# Patient Record
Sex: Female | Born: 1945 | Race: White | Hispanic: No | Smoking: Light tobacco smoker
Health system: Southern US, Community
[De-identification: ages and names within clinical notes are randomized; demographics above are authoritative.]

## PROBLEM LIST (undated history)

## (undated) DIAGNOSIS — G4730 Sleep apnea, unspecified: Secondary | ICD-10-CM

## (undated) DIAGNOSIS — E7800 Pure hypercholesterolemia, unspecified: Secondary | ICD-10-CM

## (undated) DIAGNOSIS — F32A Depression, unspecified: Secondary | ICD-10-CM

## (undated) DIAGNOSIS — G4700 Insomnia, unspecified: Secondary | ICD-10-CM

## (undated) DIAGNOSIS — IMO0001 Reserved for inherently not codable concepts without codable children: Secondary | ICD-10-CM

## (undated) DIAGNOSIS — I10 Essential (primary) hypertension: Secondary | ICD-10-CM

## (undated) DIAGNOSIS — H269 Unspecified cataract: Secondary | ICD-10-CM

## (undated) DIAGNOSIS — M1990 Unspecified osteoarthritis, unspecified site: Secondary | ICD-10-CM

## (undated) DIAGNOSIS — F419 Anxiety disorder, unspecified: Secondary | ICD-10-CM

## (undated) DIAGNOSIS — I639 Cerebral infarction, unspecified: Secondary | ICD-10-CM

## (undated) DIAGNOSIS — J449 Chronic obstructive pulmonary disease, unspecified: Secondary | ICD-10-CM

## (undated) DIAGNOSIS — J45909 Unspecified asthma, uncomplicated: Secondary | ICD-10-CM

## (undated) DIAGNOSIS — C801 Malignant (primary) neoplasm, unspecified: Secondary | ICD-10-CM

## (undated) DIAGNOSIS — K219 Gastro-esophageal reflux disease without esophagitis: Secondary | ICD-10-CM

## (undated) DIAGNOSIS — G43909 Migraine, unspecified, not intractable, without status migrainosus: Secondary | ICD-10-CM

## (undated) DIAGNOSIS — F329 Major depressive disorder, single episode, unspecified: Secondary | ICD-10-CM

## (undated) HISTORY — DX: Insomnia, unspecified: G47.00

## (undated) HISTORY — PX: ABDOMINAL HYSTERECTOMY: SHX81

## (undated) HISTORY — PX: APPENDECTOMY: SHX54

## (undated) HISTORY — DX: Unspecified asthma, uncomplicated: J45.909

## (undated) HISTORY — PX: NECK SURGERY: SHX720

## (undated) HISTORY — PX: EAR EXAMINATION UNDER ANESTHESIA: SHX1482

## (undated) HISTORY — DX: Unspecified cataract: H26.9

## (undated) HISTORY — PX: COLONOSCOPY: SHX174

## (undated) HISTORY — DX: Cerebral infarction, unspecified: I63.9

## (undated) HISTORY — DX: Major depressive disorder, single episode, unspecified: F32.9

## (undated) HISTORY — DX: Migraine, unspecified, not intractable, without status migrainosus: G43.909

## (undated) HISTORY — DX: Depression, unspecified: F32.A

## (undated) HISTORY — PX: CHOLECYSTECTOMY: SHX55

## (undated) HISTORY — PX: BACK SURGERY: SHX140

---

## 2000-05-08 ENCOUNTER — Other Ambulatory Visit: Admission: RE | Admit: 2000-05-08 | Discharge: 2000-05-08 | Payer: Self-pay | Admitting: Family Medicine

## 2000-08-24 ENCOUNTER — Encounter: Payer: Self-pay | Admitting: Emergency Medicine

## 2000-08-24 ENCOUNTER — Inpatient Hospital Stay (HOSPITAL_COMMUNITY): Admission: EM | Admit: 2000-08-24 | Discharge: 2000-08-26 | Payer: Self-pay | Admitting: Emergency Medicine

## 2000-08-26 ENCOUNTER — Encounter: Payer: Self-pay | Admitting: Family Medicine

## 2000-09-02 ENCOUNTER — Encounter: Admission: RE | Admit: 2000-09-02 | Discharge: 2000-09-02 | Payer: Self-pay | Admitting: Family Medicine

## 2002-05-20 ENCOUNTER — Encounter: Admission: RE | Admit: 2002-05-20 | Discharge: 2002-05-20 | Payer: Self-pay

## 2003-01-06 ENCOUNTER — Encounter: Payer: Self-pay | Admitting: Emergency Medicine

## 2003-01-06 ENCOUNTER — Inpatient Hospital Stay (HOSPITAL_COMMUNITY): Admission: EM | Admit: 2003-01-06 | Discharge: 2003-01-07 | Payer: Self-pay | Admitting: Emergency Medicine

## 2003-01-06 ENCOUNTER — Encounter: Payer: Self-pay | Admitting: Cardiology

## 2003-12-03 ENCOUNTER — Inpatient Hospital Stay (HOSPITAL_COMMUNITY): Admission: EM | Admit: 2003-12-03 | Discharge: 2003-12-04 | Payer: Self-pay | Admitting: Emergency Medicine

## 2004-07-30 ENCOUNTER — Observation Stay (HOSPITAL_COMMUNITY): Admission: EM | Admit: 2004-07-30 | Discharge: 2004-07-31 | Payer: Self-pay | Admitting: *Deleted

## 2004-07-30 ENCOUNTER — Ambulatory Visit: Payer: Self-pay | Admitting: Internal Medicine

## 2006-08-26 ENCOUNTER — Inpatient Hospital Stay (HOSPITAL_COMMUNITY): Admission: RE | Admit: 2006-08-26 | Discharge: 2006-08-27 | Payer: Self-pay | Admitting: Neurosurgery

## 2007-04-03 ENCOUNTER — Encounter: Admission: RE | Admit: 2007-04-03 | Discharge: 2007-04-03 | Payer: Self-pay | Admitting: Neurosurgery

## 2007-09-16 ENCOUNTER — Emergency Department (HOSPITAL_COMMUNITY): Admission: EM | Admit: 2007-09-16 | Discharge: 2007-09-16 | Payer: Self-pay | Admitting: Emergency Medicine

## 2008-03-20 ENCOUNTER — Ambulatory Visit: Payer: Self-pay | Admitting: Vascular Surgery

## 2008-06-13 ENCOUNTER — Ambulatory Visit: Payer: Self-pay | Admitting: Vascular Surgery

## 2008-06-19 ENCOUNTER — Ambulatory Visit: Payer: Self-pay | Admitting: Vascular Surgery

## 2008-06-26 ENCOUNTER — Ambulatory Visit: Payer: Self-pay | Admitting: Vascular Surgery

## 2008-07-03 ENCOUNTER — Ambulatory Visit: Payer: Self-pay | Admitting: Vascular Surgery

## 2008-07-11 ENCOUNTER — Ambulatory Visit: Payer: Self-pay | Admitting: Vascular Surgery

## 2010-05-07 ENCOUNTER — Inpatient Hospital Stay (HOSPITAL_COMMUNITY): Admission: EM | Admit: 2010-05-07 | Discharge: 2010-05-08 | Payer: Self-pay | Admitting: Emergency Medicine

## 2010-10-02 LAB — BASIC METABOLIC PANEL
BUN: 17 mg/dL (ref 6–23)
BUN: 17 mg/dL (ref 6–23)
CO2: 26 mEq/L (ref 19–32)
Calcium: 9.4 mg/dL (ref 8.4–10.5)
Creatinine, Ser: 0.8 mg/dL (ref 0.4–1.2)
GFR calc Af Amer: >60 mL/min (ref >60)
GFR calc non Af Amer: >60 mL/min (ref >60)
Glucose, Bld: 155 mg/dL — ABNORMAL HIGH (ref 70–99)
Glucose, Bld: 274 mg/dL — ABNORMAL HIGH (ref 70–99)
Potassium: 3.4 mEq/L — ABNORMAL LOW (ref 3.5–5.1)

## 2010-10-02 LAB — CBC
HCT: 40 % (ref 36.0–46.0)
Hemoglobin: 12.5 g/dL (ref 12.0–15.0)
Hemoglobin: 14.1 g/dL (ref 12.0–15.0)
MCHC: 35.3 g/dL (ref 30.0–36.0)
MCV: 92.5 fL (ref 78.0–100.0)
Platelets: 264 10*3/uL (ref 150–400)
Platelets: 294 10*3/uL (ref 150–400)
RBC: 4.33 MIL/uL (ref 3.87–5.11)
RDW: 12.9 % (ref 11.5–15.5)

## 2010-10-02 LAB — LIPID PANEL
HDL: 29 mg/dL — ABNORMAL LOW (ref >39)
LDL Cholesterol: 77 mg/dL (ref 0–99)

## 2010-10-02 LAB — DIFFERENTIAL
Basophils Absolute: 0 10*3/uL (ref 0.0–0.1)
Basophils Relative: 0 % (ref 0–1)
Eosinophils Absolute: 0 10*3/uL (ref 0.0–0.7)
Eosinophils Relative: 0 % (ref 0–5)
Lymphs Abs: 0.3 10*3/uL — ABNORMAL LOW (ref 0.7–4.0)
Neutro Abs: 10.9 10*3/uL — ABNORMAL HIGH (ref 1.7–7.7)
Neutrophils Relative %: 96 % — ABNORMAL HIGH (ref 43–77)

## 2010-10-02 LAB — GLUCOSE, CAPILLARY

## 2010-12-03 NOTE — Assessment & Plan Note (Signed)
OFFICE VISIT   BETTEJANE, LEAVENS  DOB:  1945-10-22                                       06/13/2008  ZOXWR#:[id number]   The patient returns for her 3 month followup visit for severe venous  insufficiency of both lower extremities.  She has large bulbous  varicosities throughout both legs in the calf, thigh, both anteriorly  and posteriorly, secondary to severe reflux in both great saphenous  veins throughout from the saphenofemoral junction to the knee.  She had  an unaccessible attempt at closure in the past of the saphenous vein but  has continued to have severe symptoms.  Elastic compression stockings  have been worn on a daily basis (long leg 20 mm - 30 mm gradient).  She  has tried ibuprofen as well as elevation of the legs without success and  this is definitely effecting her activities of daily living on a  constant basis.  She does need laser ablation of her left great  saphenous vein with multiple stab phlebectomies to be followed by the  right side and then will probably require an additional session or two  of stab phlebectomies because of the multiple number of these painful  varicosities.  We will proceed in the near future with the left leg.   [name], M.D.  Electronically Signed   JDL/MEDQ  D:  06/13/2008  T:  06/14/2008  Job:  [id number]

## 2010-12-03 NOTE — Assessment & Plan Note (Signed)
OFFICE VISIT   URI, COVEY  DOB:  May 24, 1946                                       06/26/2008  ZOXWR#:[id number]   The patient underwent laser ablation of her left great saphenous vein  with greater than 20 stab phlebectomies 1 week ago for painful  varicosities in the left thigh and ankle and calf area.  She states that  she has had minimal discomfort except for at the ablation site in the  thigh.  She has had no swelling distally in the ankle and foot and has  been wearing her elastic compression stocking and taking ibuprofen as  instructed.  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   On exam, she has no distal edema in the left leg, her stab phlebectomy  wounds are healing well.  She does have a moderate amount of ecchymosis  in the left medial thigh, although this is only minimally tender.  She  is ready to proceed with a similar procedure on the contralateral right  leg, which we will schedule next week, for the painful varicosities in  the right leg.   [name], M.D.  Electronically Signed   JDL/MEDQ  D:  06/26/2008  T:  06/27/2008  Job:  [id number]

## 2010-12-03 NOTE — Procedures (Signed)
DUPLEX DEEP VENOUS EXAM - LOWER EXTREMITY   INDICATION:  Follow up left greater saphenous vein ablation.   HISTORY:  Edema:  Intermittent, improved since procedure.  Trauma/Surgery:  Left greater saphenous vein ablation with stab  phlebectomy on 06/19/2008 by Dr. Hart Rochester.  Pain:  Proximal / mid thigh.  PE:  No.  Previous DVT:  No.  Anticoagulants:  No.  Other:   DUPLEX EXAM:                CFV   SFV   PopV  PTV    GSV                R  L  R  L  R  L  R   L  R  L  Thrombosis    o  o     o     o      o     +  Spontaneous   +  +     +     +      +     0  Phasic        +  +     +     +      +     0  Augmentation  +  +     +     +      +     0  Compressible  +  +     +     +      +     0  Competent     0  0     +     +      +     0   Legend:  + - yes  o - no  p - partial  D - decreased   IMPRESSION:  1. No evidence of deep venous thrombosis in the left lower extremity      or the right common femoral vein.  2. Evidence of ablation without flow in the left greater saphenous      vein from groin to knee (no extension from saphenofemoral      junction).  3. 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  4. Evidence of bilateral common femoral venous insufficiency.  5. Note:  Left greater saphenous vein of caliber of >1.5 cm in areas      of the thigh.   __________________________________________   Quita Skye. Lawson,M.D.   AS/MEDQ  D:  06/26/2008  T:  06/27/2008  Job:  [id number]

## 2010-12-03 NOTE — Procedures (Signed)
DUPLEX DEEP VENOUS EXAM - LOWER EXTREMITY   INDICATION:  Follow-up right greater saphenous vein ablation.   HISTORY:  Edema:  Yes.  Trauma/Surgery:  Yes.  Pain:  Mild.  PE:  No.  Previous DVT:  No.  Anticoagulants:  Other:   DUPLEX EXAM:                CFV   SFV   PopV  PTV    GSV                R  L  R  L  R  L  R   L  R  L  Thrombosis    0  0  0  0  0  0  0   0  +  +  Spontaneous   +  +  +  +  +  +  +   +  0  0  Phasic        +  +  +  +  +  +  +   +  0  0  Augmentation  +  +  +  +  +  +  +   +  0  0  Compressible  +  +  +  +  +  +  +   +  0  0  Competent     +  +  +  +  +  +  +   +  0  0   Legend:  + - yes  o - no  p - partial  D - decreased   IMPRESSION:  1. No evidence of DVT noted in bilateral legs.  2. The right greater saphenous vein appears ablated from the      saphenofemoral junction to the knee level.  3. Thrombus noted in the left proximal lesser saphenous vein and the      varicose vein distal below the calf in the lateral area.    _____________________________  Quita Skye. Hart [place], M.D.   MG/MEDQ  D:  07/11/2008  T:  07/11/2008  Job:  [id number]

## 2010-12-03 NOTE — Procedures (Signed)
LOWER EXTREMITY VENOUS REFLUX EXAM   INDICATION:  Bilateral lower extremity varicose veins.   EXAM:  Using color-flow imaging and pulse Doppler spectral analysis, the  right and left common femoral, superficial femoral, popliteal, posterior  tibial, greater and lesser saphenous veins are evaluated.  There is  evidence suggesting deep venous insufficiency in the right and left  common femoral veins only.   The right and left saphenofemoral junctions are not competent.  The  right and left GSV's are not competent with the caliber as described  below.   The right and left proximal short saphenous vein demonstrates competency  bilaterally.   GSV Diameter (used if found to be incompetent only)                                            Right    Left  Proximal Greater Saphenous Vein           1.4 cm   0.9 cm  Proximal-to-mid-thigh                     1.8 cm   0.8 cm  Mid thigh                                 1.0 cm   1.4 cm  Mid-distal thigh                          1.0 cm   0.4 cm  Distal thigh                              1.0 cm   0.4 cm  Knee                                      cm       0.3 cm   IMPRESSION:  1. Right and left greater saphenous vein reflux is identified with the      caliber ranging from 1.8 cm to 1.0 cm from the groin to the distal      thigh on the right.  Diameter measurements range from 1.4 cm to 0.3      cm on the left greater saphenous vein from the groin to the knee.  2. The right and left greater saphenous veins are not aneurysmal.  3. The right greater saphenous vein is tortuous in the distal thigh      portion of the vein.  The left greater saphenous vein is not      tortuous.  4. The deep venous system is competent.  5. The right and left lesser saphenous veins are competent.  6. There is a branch at the left greater saphenous vein at the distal      thigh that wraps posteriorly around the thigh and gives rise to      most of the  varicosities in the left leg.      ___________________________________________  Quita Skye. Hart [place], M.D.   MC/MEDQ  D:  03/20/2008  T:  03/20/2008  Job:  [id number]

## 2010-12-03 NOTE — Consult Note (Signed)
VASCULAR SURGERY CONSULTATION   Kristi Kramer, Kristi Kramer  DOB:  July 03, 1946                                       03/20/2008  ZOXWR#:[id number]   The patient was referred for a vascular surgery consultation for severe  venous insufficiency of both legs, referral of [facility].  This patient has been having increasing aching, throbbing,  and burning discomfort in both legs with the left worse than the right,  as well as occasional numbness in the feet and hypersensitivity over the  last several years.  The symptoms in the left leg have been quite severe  in the thigh and calf, and are not relieved by elevation.  She does take  Motrin on a regular basis without improvement.  She has been wearing  elastic compression stockings, which were recommended and prescribed by  a physician in the past.  These have not helped her symptoms either.  She has swelling toward the end of the day, but has no history of  thrombophlebitis, deep vein thrombosis, bleeding, or ulceration.  Apparently, she had attempted closure of a vein in her leg by a  physician 5 or 6 years ago, but I do not have the details of that.  She  did not have any definitive procedure performed at that time.  She has  also been evaluated for sciatica with discussion of a possible MRI scan  to see if this could be contributing to her left leg symptoms.   PAST MEDICAL HISTORY:  1. Hypertension.  2. Hyperlipidemia.  3. History of enlarged liver.  4. 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  5. Negative for coronary artery disease, diabetes, or stroke.   PREVIOUS SURGERY:  1. Cholecystectomy.  2. Bilateral rotator cuff repairs.  3. Appendectomy.  4. Cervical spine fusion.   FAMILY HISTORY:  Positive for coronary artery disease in mother.  Negative for diabetes or stroke.   SOCIAL HISTORY:  She is single and has 5 children.  She smokes between  1/2 pack and 1 pack of cigarettes per day.  has done  so for 10+ years.  Does not use alcohol.   ALLERGIES:  Codeine.   MEDICATIONS:  Please see health history form.   PHYSICAL EXAM:  Blood pressure is 130/93, heart rate 80, respirations  14.  Generally, she is a middle-aged female in no apparent distress.  Her neck is supple.  3+ carotid pulses palpable.  No bruits are audible.  Neurologic exam is normal.  No palpable adenopathy in the neck.  Chest  clear to auscultation.  Cardiovascular exam is regular rhythm, no  murmurs.  Abdomen is soft and nontender with no palpable masses.  She  has 3+ femoral, popliteal, and dorsalis pedis pulses bilaterally.  She  has severe venous insufficiency of both lower extremities with large  bulbous varicosities of the greater saphenous systems of both legs  beginning in the mid proximal thigh extending down into the medial calf,  extending laterally across the anterior thigh and into the lateral calf  bilaterally.  She also has large bulbous varicosities in the posterior  in the posterior calf communicating with the great saphenous system in  the lower thigh.  She has some hyperpigmentation and chronic edema in  both ankles, but no active ulceration or infection.   Venous duplex exam was performed, which revealed  gross reflux to  bilateral saphenofemoral junction and throughout the bilateral great  saphenous systems.  There is no deep vein thrombosis or obstruction, and  the small saphenous veins are competent bilaterally.   This patient will be treated conservatively with continued use of  elastic compression stockings, and we will try to document exactly how  long she has been wearing these.  She has tried analgesics and  elevation, which she will continue to do.  She should be treated with  bilateral laser ablations of her great saphenous systems and will  require at least 2 sessions of stab phlebectomies to remove the largest  bulbous varicosities and relieve her symptoms.   [name],  M.D.  Electronically Signed  JDL/MEDQ  D:  03/20/2008  T:  03/21/2008  Job:  [id number]

## 2010-12-03 NOTE — Assessment & Plan Note (Signed)
OFFICE VISIT   JAMY, WHYTE  DOB:  September 28, 1945                                       07/11/2008  EAVWU#:[id number]   The patient had laser ablation of her right great saphenous vein with  greater than 20 stab phlebectomies done 1 week ago.  She has had an  excellent early result with decreased discomfort.  She has had some mild  aching in the thigh over the course of the right great saphenous vein  where the ablation was performed.   On examination she does have some ecchymosis in the distal thigh and  proximal calf underlying the phlebectomy areas.  There is no distal  edema in the ankle and foot.  Incisions are healing adequately.  She  does have some tightness and discomfort in her left calf which she has  noticed over the last 3 days with no ankle edema.  She had a long string  of varicosities beginning just below the knee laterally and posteriorly  extending down to the lateral malleolus which we had planned to perform  stab phlebectomies on in the near future.  On examination today these  appear to be thrombosed with some mild tenderness.  There is no distal  edema noted.  Venous duplex exam of this side revealed no deep venous  obstruction but thrombus in these superficial varicosities.   I suspect that the superficial varicosities in the left leg were  draining into the great saphenous system and now they have secondarily  thrombosed.  We will not plan any further treatment at this time unless  she continues to have symptoms at which point she will be in touch with  [place].   [name], M.D.  Electronically Signed   JDL/MEDQ  D:  07/11/2008  T:  07/12/2008  Job:  [id number]

## 2010-12-06 NOTE — Discharge Summary (Signed)
NAME:  Kristi Kramer, Kristi Kramer                         ACCOUNT NO.:  [id number]   MEDICAL RECORD NO.:  [id number]                   PATIENT TYPE:  INP   LOCATION:  4736                                 FACILITY:  [facility]   PHYSICIAN:  Learta Codding, M.D.                 DATE OF BIRTH:  Apr 27, 1946   DATE OF ADMISSION:  01/06/2003  DATE OF DISCHARGE:  01/07/2003                           DISCHARGE SUMMARY - REFERRING   PROCEDURES:  None.   HOSPITAL COURSE:  Kristi Kramer is a 65 year old female with no known history  of coronary artery disease.  She generally gets her usual medical care at  [facility].  She was hospitalized in 2002 for possible TIA and chest  pain and had negative enzymes for MI at that time, and a 2-D echo showed an  EF of 55-65% with normal wall motion and no thrombus.  On the day of  admission she had a near syncopal episode in [place] that was associated  with exertion and being outside in very hot weather.  She laid flat and felt  somewhat better but developed substernal chest pain and took a  nitroglycerin.  She was brought to the emergency room and was weak and had  stable vital signs until she received another nitroglycerin at which point  her blood pressure dropped into the 60s systolic, and she developed some  slurred speech.  She improved with the Trendelenburg position and IV fluids.  She was admitted for further evaluation.   The next day her enzymes were negative for MI, and there was concern because  of a chest x-ray that showed an opacity possibly due to atelectasis and  pneumonia and, therefore, a two-view chest x-ray was recommended.  On the  repeat x-ray the opacity questioned appears to be most typical of minimal  linear atelectasis with no pneumonia or effusion.  The heart was mildly  enlarged, but no bony abnormality was seen.  With her enzymes negative for  MI, no further chest pain, and a chest x-ray without significant  abnormality, she was  considered stable for discharge on January 07, 2003.   LABORATORY VALUES:  Hemoglobin 14.1, hematocrit 40.1, WBC 10.3, platelets  272.  The d-dimer was 0.48.  Sodium 140, potassium 3.9, chloride 107, CO2  22, BUN 17, creatinine 1.1, glucose 109.  Serial CK-MB and troponin I  negative for MI.  Magnesium 2.4.  BNP 30.7.   DISCHARGE CONDITION:  Stable.   DISCHARGE DIAGNOSES:  1. Presyncope, postural hypotension, felt secondary to mild dehydration.  2. Hypotension secondary to nitroglycerin administration.  3. Possible sarcoid with a recent bronchoscopy at [facility], but records     are not available.  4. History of arthritis.  5. History of admission for chest pain and possible transient ischemic     attack in 2002, with a normal echocardiogram at that time.  6. Transient ischemic attack in 2002.  7. Hypertension.  8. Family history of liver cancer.  9. Obesity.   DISCHARGE INSTRUCTIONS:  Her activity level is to be as tolerated.  She is  to stick to a low-fat diet.  She is to follow up with Dr. Olga Coaster at the  [facility] in [place].    DISCHARGE MEDICATIONS:  1. Reglan 10 mg b.i.d.  2. Lipitor 20 mg q.h.s.  3. Celexa 20 mg daily.  4. Nexium 40 mg daily.  5. Toprol-XL 100 mg daily.  6. Phenergan p.r.n.  7. Xanax 0.5 mg p.r.n.     Lavella Hammock, P.A. [facility]                  Learta Codding, M.D.    RG/MEDQ  D:  01/29/2003  T:  01/30/2003  Job:  [id number]   cc:   Olga Millers, M.D.   Dr. Olga Coaster  [facility]  [place]    cc:   Olga Millers, M.D.   Dr. Olga Coaster  Ambulatory [facility]

## 2010-12-06 NOTE — H&P (Signed)
NAME:  Kristi Kramer, Kristi Kramer                         ACCOUNT NO.:  [id number]   MEDICAL RECORD NO.:  [id number]                   PATIENT TYPE:  INP   LOCATION:  1825                                 FACILITY:  [facility]   PHYSICIAN:  Learta Codding, M.D.                 DATE OF BIRTH:  1946-04-10   DATE OF ADMISSION:  01/06/2003  DATE OF DISCHARGE:                                HISTORY & PHYSICAL   REFERRING PHYSICIAN:  Dr. Leonides Cave, [facility].   CURRENT COMPLAINT:  Presyncope and weakness as well as atypical chest pain.   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 The patient goes for  her usual medical care at [facility]. She was brought to the emergency  room after she experienced a near-syncopal episode in [place] where she  lives. The patient today was outside and painting in the hot weather. She  slowly felt unwell. She became diaphoretic and nauseated. She also felt  presyncopal. She then managed to get into her car and try to turn on the air  conditioner. She still felt very weak and clammy and felt like she was going  to pass out. She then laid flat and felt that  she was getting somewhat  better. She also developed left-sided substernal chest pain for which she  took a nitroglycerin.   Subsequently, she felt very weak and lethargic, unable to get up and her  friend called the EMS. She was brought to the emergency room at [facility]. In the emergency room, the patient reported no substernal chest  pain. She still felt somewhat weak but she had stable vital signs. She was  given, for unclear reasons, nitroglycerin and then her blood pressure  plummeted to 68/40 and she developed some slurred speech and became  lethargic. After intravenous fluids and after the patient was put in  Trendelenburg position, she then improved.  Serial EKGs showed no  injury  pattern and her initial set of enzymes are within normal limits.   ALLERGIES:  CODEINE.   MEDICATIONS:  1. Reglan 70 mg p.o. b.i.d.  2. Lipitor 20 mg p.o. at bedtime.  3. Phenergan 25 mg p.o. q.6 h. p.r.n.  4. Celexa 20 mg, one tablet p.o. daily.  5. Alprazolam p.r.n.  6. Nexium 40 mg p.o. at bedtime.  7. Toprol XL 100 mg p.o. daily.   PAST MEDICAL HISTORY:  1. 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 Records are not available.  2. History of arthritis. The patient is in the process of applying for     disability.  3. Questionable history of congestive heart failure but normal 2D     echocardiogram study two years ago at [facility]. No  prior     cardiac catheterization.  4. History of TIA in February 2002.  5. Current records were reviewed and  her workup was essentially within     normal limits including negative MRI study.  6. History of hypertension.   SOCIAL HISTORY:  The patient lives in [place] with a friend. The patient  smokes one pack of cigarettes a day. She denies alcohol.   FAMILY HISTORY:  Notable for coronary artery disease in her mother and her  father died of liver cancer. The patient has a half-sister who has  congestive heart failure.   REVIEW OF SYSTEMS:  No fevers or chills. No headache or sore throat.  Positive for chest pain and shortness of breath as outlined above with  extreme exertion. No orthopnea or PND, however. No frequency or dysuria.  Positive for weakness. No nausea or vomiting currently.   PHYSICAL EXAMINATION:  VITAL SIGNS:  Blood pressure 68/49 and improved to  100/60 after IV fluids, respirations 18 breaths per minute, pulse 80 bpm.  GENERAL:  An obese white female in no apparent distress.  HEENT:  PERRLA, EOMI.  NECK:  Supple, no bruits or JVD.  HEART:  Regular rate and rhythm, normal sinus rhythm.  LUNGS:  Clear breath sounds bilaterally.  SKIN:  No rashes or  lesions.  ABDOMEN:  Soft, nontender but distended. No shifting dullness.  PELVIC/RECTAL:  Deferred.  EXTREMITIES:  No clubbing, cyanosis or edema.  NEUROLOGICAL:  The patient was alert and oriented and grossly nonfocal.   LABORATORY DATA:  Chest x-ray:  No cardiomegaly. Opacity at the left lower  base, atelectasis versus pneumonia.   EKG, normal sinus rhythm, rate 80 bpm. No acute ischemic changes.   Laboratories by iSTAT:  Hemoglobin 14, hematocrit 40. Sodium 139, potassium  4.0, BUN 20, creatinine 1.2, glucose 111.   IMPRESSION/PLAN:  1. Presyncope and weakness. The patient appears to have experienced a     vasovagal episode and likely was dehydrated. Particularly her response to     nitroglycerin was very dramatic with a precipitous drop in blood     pressure. There is no clinical evidence of congestive heart failure. The     patient continues to take intravenous fluids and this will be continued     throughout the night.  I do not think the patient's presyncope was     related to a tachyarrhythmia, particularly in light of possible history     of sarcoidosis (cardiac sarcoidosis). The history the patient gives me is     rather unclear and certainly the records from [facility] will need to be     reviewed to make sure that this is indeed the correct diagnosis. There is     nothing obvious on her chest x-ray or clinical examination to suggest     that she has sarcoidosis.  2. Sarcoidosis as outlined above.  3. Opacity at the left lower lung base. A chest x-ray PA and lateral will     need to be obtained to further evaluate this, as a portable chest x-ray     was done in the emergency room.  Clinically, the patient does not have     pneumonia.  4.     Disposition. After rehydration and cardiac enzymes were negative, I feel the     patient can likely be discharged safely tomorrow with a followup by her    primary care physician at [facility]. The patient also tells me that a  stress test was done not too long ago but does not have the results     available.                                               Learta Codding, M.D.    GED/MEDQ  D:  01/06/2003  T:  01/06/2003  Job:  [id number]   cc:   Dr. Leonides Cave  Ambulatory [facility]  [facility]    cc:   Dr. Leonides Cave  Ambulatory [facility]  [facility]

## 2010-12-06 NOTE — Discharge Summary (Signed)
Kristi Kramer, Kristi Kramer               ACCOUNT NO.:  [id number]   MEDICAL RECORD NO.:  [id number]          PATIENT TYPE:  INP   LOCATION:  3705                         FACILITY:  [facility]   PHYSICIAN:  Donald Pore, MD       DATE OF BIRTH:  06-29-1946   DATE OF ADMISSION:  07/30/2004  DATE OF DISCHARGE:  07/31/2004                                 DISCHARGE SUMMARY   DISCHARGE DIAGNOSES:  1.  Shortness of breath and chest pain x 4 days of unclear etiology.  2.  Chronic obstructive pulmonary disease.  3.  Hypertension.  4.  Pulmonary sarcoidosis.  5.  Status post transient ischemic attack in 2001.  6.  Obstructive sleep apnea.  7.  Thrombophlebitis of the left lower extremity.  8.  Gastrointestinal reflux disease.  9.  Hyperlipidemia.  10. Depression.  11. Nonalcoholic steatohepatitis.  12. Status post rotator cuff surgery of the left shoulder in August of 2005.  13. Status post total abdominal hysterectomy and bilateral salpingo-      oophorectomy.  14. Status post cholecystectomy.  15. Status post appendectomy.   DISCHARGE MEDICATIONS:  1.  Toprol XL 100 mg by mouth once daily.  2.  Aspirin 81 mg by mouth once daily.  3.  Simvastatin 40 mg by mouth once daily.  4.  Lisinopril 10 mg by mouth once daily.  5.  Reglan 10 mg by mouth once daily.  6.  Lexapro 10 mg by mouth once daily.  7.  Advair 100/50 mg one puff twice daily.  8.  Spiriva 18 mcg tablets inhaled once daily.  9.  Hydrochlorothiazide 12.5 mg by mouth once daily.  10. Flonase 50 mcg inhaled per nostril once daily.  11. Alprazolam 0.5 mg by mouth p.r.n. for anxiety.  12. Albuterol MDI 90 mcg every four hours p.r.n. for shortness of breath.  13. Nitroglycerin 0.4 mg tablets sublingual every five minutes x 3 p.r.n.      chest pain.  14. Nexium 40 mg by mouth once daily.   PROCEDURES PERFORMED:  1.  Chest x-ray on July 30, 2004, with no evidence for acute processes,      mild interstitial edema and no infiltrates.  2.  Spiral CT of the chest.  PE protocol.  No embolus visualized.      Incidental finding of an enlarged lymph node in the region of the celiac      trunk IVC of unknown significance.  This should be followed up.   HISTORY OF PRESENT ILLNESS:  Kristi Kramer is 65 year old female with an  extensive past medical history most significant for COPD, hypertension,  hyperlipidemia, sarcoidosis with pulmonary involvement, obstructive sleep  apnea and diastolic dysfunction of the left hear.  She presents with  approximately a three-day history of severe chest pain which started on  Sunday morning while she was in her car driving.  She described the pain as  both a pressure in her left chest as if someone were squeezing her chest, as  well as sharp pain that radiated to her left shoulder and back and down her  left arm to her elbow.  She also admits to nausea and lightheadedness, but  denies headache, syncope or visual disturbances.  She complains of shortness  of breath at rest with increased pain upon deep inspiration.  She noticed  subsidence of the pain with use of home nitroglycerin tablets which have  been prescribed for her use for chest pain in the past, but which she rarely  needs.  She says she did not immediately seek medical attention because she  thought the pain would just go away.  However, due to the continued present  nature of this pain since Sunday, she decided to seek medical attention.  She presented to her primary care [name] physician's assistant.  An EKG  was done in the office and she was referred to the [facility] ED  after being evaluated in the clinic.   ALLERGIES:  CODEINE.  The patient breaks out in hives.   PHYSICAL EXAMINATION:  VITAL SIGNS:  Kristi Kramer was evaluated in the  emergency department on July 30, 2004.  She was afebrile with a pulse of  64, blood pressure at 102/55, respirations of 20 and O2 saturation of 100%  on 2 L of oxygen.  GENERAL  APPEARANCE:  She was in no acute distress.  Alert and oriented x 3.  HEENT:  Eyes:  Extraocular movements were intact.  Pupils were equal, round  and reactive to light and accommodation.  Throat:  Her oropharynx was clear  without lesions.  NECK:  No lymphadenopathy.  RESPIRATORY:  She was clear to auscultation bilaterally.  No crackles  appreciated.  CARDIOVASCULAR:  Regular rate and rhythm without murmur, rub or gallop  appreciated.  Distal pulses were 2+ and equal bilaterally.  GASTROINTESTINAL:  Abdomen obese, soft, nontender and nondistended.  Normoactive bowel sounds.  EXTREMITIES:  No lower edema bilaterally.  SKIN:  Clear without rashes or other lesions.  NEUROLOGIC:  Cranial nerves II-XII intact bilaterally.  PSYCHIATRIC:  Describes her mood as good.  The patient exhibited normal  affect.  MUSCULOSKELETAL:  The patient demonstrated normal range of motion of the  upper extremities bilaterally.  Strength was 5/5 in the upper extremities  bilaterally.  There was a scar noted on her left shoulder from prior rotator  cuff surgery in August of last year.   LABORATORY STUDIES:  EKG demonstrated a normal sinus rhythm with no acute  changes.  Sodium 139, potassium 3.6, chloride 104, bicarbonate 29.7, BUN 11,  creatinine 0.7, glucose 103.  Hemoglobin 13.3, hematocrit 39.0.  The first  set of cardiac enzymes was within normal limits.   HOSPITAL COURSE:  [name] was felt to have presentation with atypical  chest pain.  Due to her multiple cardiac risk factors, including smoking,  obesity and a sedentary lifestyle, we felt it appropriate to rule out  myocardial infarction and decided to cycle cardiac enzymes x 3.  These were  normal throughout the hospital course.  She was monitored with telemetry on  a regular floor.  There were no telemetry events throughout the course of  her stay.  Other considerations on our differential diagnosis included pulmonary embolus, aortic dissection,  musculoskeletal pain or pain resulting  from her underlying lung disease of sarcoidosis.  We felt that pulmonary  embolus was low probability, however, due to her history of thrombophlebitis  approximately three months ago, we decided to order a D-dimer test and he  returned mildly elevated at 0.77.  In light of these test result, we  felt it  appropriate to pursue the pulmonary embolus workup further and ordered a  spiral CT of the chest with PE protocol.  This returned with no embolus  seen.  On her second hospital day, Ms. Sazama was feeling well.  There were  no acute events overnight.  She was reporting much decreased pain in her  chest to approximately a 2/10.  Followup with her primary care Rashaan Wyles,  Lonie Peak, P.A., at [facility] in [place], [place], revealed that she has had negative cardiac workups in the past.  Most recently she had a negative exercise stress test, although the patient  discontinued the test after approximately two minutes due to chest pain,  although no acute changes were seen on EKG during that test.  However,  further consideration of coronary artery disease in the outpatient setting  may be appropriate in light of this patient risk factors and previous  documented diastolic dysfunction by echocardiogram.  Additionally, as noted  previously in the procedure section, an enlarged lymph node was noted in the  region of the celiac trunk in between the inferior vena cava and the hepatic  artery.  Per radiology report, this is of unknown significance and should be  noted to possibly be followed up by her primary care Daphanie Oquendo.  These  findings were discussed with the patient and we informed her that we felt  that her chest pain was not a result of acute coronary syndrome or pulmonary  embolus or any other life threatening process.  We advised her that it would  be safe for her to go home if she felt up to it and she agreed that she was   feeling much better with much decreased pain and was agreeable to going home  and to follow up with Lonie Peak, P.A., her primary care Cornella Emmer.  An  appointment was scheduled for her to see Mr. Anna Genre next Tuesday, August 06, 2004, and to return to see [place] in clinic for hospital followup on August 13, 2003.       HP/MEDQ  D:  07/31/2004  T:  07/31/2004  Job:  [id number]

## 2010-12-06 NOTE — H&P (Signed)
[place]  Patient:    NAYLANI, BRADNER                        MRN: [id number] Adm. Date:  08/24/00 Attending:  Deniece Portela A. Sheffield Slider, M.D. Dictator:   Clance Boll, M.D. CC:         Maricela Bo, M.D.   History and Physical  DATE OF BIRTH:  10-01-1945  SERVICE:  Family Medicine.  ATTENDING:  Wayne A. Sheffield Slider, M.D.  PRIMARY CARE PHYSICIAN:  Maricela Bo, M.D.  PROBLEM LIST:  1. Transient focal neurological deficits, likely transient ischemic attack.  2. Chest pain, rule out myocardial infarction.  3. Hypertension x 13 years.  4. Recent bilateral acute otitis media on antibiotics intermittently since     August 17, 2000.     Questionable bilateral mastoiditis on CT today.  5. Positive tobacco abuse - two packs per day x 5 years, now one pack per     week.  6. Surgical menopause x 20 years, no hormone replacement therapy.  7. Chronic sinus congestion and allergy symptoms.  8. History of migraine headaches every week.  9. Daily headaches that are described as throbbing with nausea, auras, and     schitomata. 10. Status post appendectomy. 11. Status post cholecystectomy. 12. Status post total abdominal hysterectomy/bilateral salpingo-oophorectomy.  CHIEF COMPLAINT:  Slurred speech.  BRIEF HISTORY OF PRESENT ILLNESS:  This is a 65 year old white female with a past medical history significant for hypertension who reports that at approximately 8:30 this morning while walking at work she developed some substernal/epigastric pain that was associated with diaphoresis, nausea, shortness of breath, and headache.  She went to sit down and developed weakness all over with right side greater than left, blurry vision, and vision going dark as if she was going to pass out.  She then noticed that her speech was slurred and her tongue was deviating to the right.  She was taken to her primary M.D.s office where she was noted to have slurred speech and a  blood pressure of 190/110.  EKG at his office showed normal sinus rhythm.  She was then sent to [facility] ER for further evaluation.  At the time of this history and physical her symptoms have completely resolved except for a dull headache.  PAST MEDICAL HISTORY:  See Problem List.  MEDICATIONS: 1. Metoprolol 25 mg p.o. q.d. 2. Monopril/HCTZ 10/12.5 mg p.o. q.d. 3. Amoxicillin 875 mg p.o. b.i.d. since August 17, 2000 but has not taken    regularly. 4. Antibiotic ear drops prescribed August 17, 2000 to be applied t.i.d. 5. Occasional Tylenol for daily headaches.  ALLERGIES:  CODEINE.  SOCIAL HISTORY:  She is divorced x 3 years.  Works at [employer]. Has five children and three grandchildren.  Admits to smoking two packs per day x 4-5 years but is gradually quitting and now smokes one pack per week with rare ETOH, no drugs.  FAMILY HISTORY:  Significant for her father who died at 49 with liver cancer. Mom died in her 31s with hypertension and CHF.  She has two brothers who died in their late 79s to early 21s of heart trouble.  Distant relatives with diabetes.  REVIEW OF SYSTEMS:  Significant for chronic sinus pain, chronic allergy-type symptoms.  No recent fevers or chills, no recent change in appetite.  No nausea and vomiting other than today.  Normal bowel movements, no bright red blood  per rectum, no melena.  No dysuria.  No abdominal pain.  No current ear pain.  No recent weight loss or weight gain.  PHYSICAL EXAMINATION:  VITAL SIGNS:  Temperature 97.4, pulse 74 and regular, respirations 16, blood pressure 151/86, O2 saturation 100% on room air.  GENERAL:  She is alert, interactive, well appearing, in no distress.  She is accompanied by her daughter and grandson.  HEENT:  [place]/AT, PERRLA, EOMI.  Sinuses nontender, nares patent.  TMs reveal left TM erythematous, dull membrane with effusion.  Right TM with serous effusion but no erythema.  Mastoids nontender  bilaterally.  OP with erythema, no exudate.  NECK:  Supple.  No TM, no carotid bruits, positive bilateral lymphadenopathy, nontender.  CARDIOVASCULAR:  RRR without murmurs, occasional PVCs noted.  LUNGS:  Clear to auscultation bilaterally with good aeration.  ABDOMEN:  Soft, obese, NT/ND, positive BS, with multiple well-healed scars.  EXTREMITIES:  Reveal 2+ DP and PT pulses bilaterally.  No edema.  SKIN:  Warm and dry.  RECTAL:  Normal sphincter tone, heme negative.  NEUROLOGIC:  Cranial nerves 2-12 grossly intact.  Strength 5/5 in the upper and lower extremities bilaterally.  Sensation intact throughout.  DTRs 0-1+ bilaterally.  Babinskis downgoing.  Cerebellar and rapid alternating movements are equal and within normal limits.  LABORATORY DATA:  White blood cell count 7, hemoglobin 13.7, platelets 272. Complete metabolic panel within normal limits.  PT and PTT normal.  UA normal.  EKG shows normal sinus rhythm without acute changes.  CT of the head without contrast is negative with questionable bilateral mastoiditis.  ASSESSMENT AND PLAN: 1. Focal neurological deficits, likely TIA, now resolved.  However,    presentation is clouded by chronic sinus symptoms as well as chronic    frequent migraines. 2. Chest pain, possible angina.  Cardiac risk factors include tobacco abuse,    surgical menopause without hormone replacement therapy, obesity,    sedentary lifestyle, and hypertension. 3. Tobacco abuse. 4. Frequent headaches. 5. Bilateral acute otitis media with possible mastoiditis.  PLAN:  Admit to teaching service, telemetry bed.  Check carotid Dopplers and 2-D echo.  Rule out MI with serial enzymes and EKGs.  Check fasting lipid panel in the a.m. and follow for any arrhythmia on the monitors.  Continue her antibiotics for the acute ear infection. DD:  08/24/00 TD:  08/24/00 Job: [id number]

## 2010-12-06 NOTE — Op Note (Signed)
Kristi Kramer, Kristi Kramer               ACCOUNT NO.:  [id number]   MEDICAL RECORD NO.:  [id number]          PATIENT TYPE:  INP   LOCATION:  2899                         FACILITY:  [facility]   PHYSICIAN:  Cristi Loron, M.D.DATE OF BIRTH:  04-Mar-1946   DATE OF PROCEDURE:  08/26/2006  DATE OF DISCHARGE:                               OPERATIVE REPORT   BRIEF HISTORY:  The patient is a 65 year old white female who has  suffered from neck and arm pain.  She failed medical management and was  worked up with a cervical MRI which demonstrated spondylosis and  stenosis at C5-6.  I discussed the various treatment options with the  patient including surgery.  She has weighed the risks, benefits and  alternatives to surgery and decided to proceed with C5-6 anterior  cervical diskectomy, fusion and plating.   PREOPERATIVE DIAGNOSIS:  C5-6 spondylosis, stenosis, cervical  radiculopathy and cervicalgia.   POSTOPERATIVE DIAGNOSIS:  C5-6 spondylosis, stenosis, cervical  radiculopathy and cervicalgia.   PROCEDURE:  C5-6 extensive anterior cervical diskectomy/decompression;  C5-6 anterior interbody arthrodesis with local morselized autograft bone  and Alphatec bone graft extender; insertion of C5-6 interbody prosthesis  (Alphatec PEEK interbody prosthesis); C5-6 anterior cervical  instrumentation with Codman Slim-Loc titanium plate and screws).   SURGEON:  Delma Officer, M.D.   ASSISTANT:  Hilda Lias, M.D.   ANESTHESIA:  General endotracheal.   ESTIMATED BLOOD LOSS:  50 mL.   SPECIMENS:  None.   DRAINS:  None.   COMPLICATIONS:  None.   PROCEDURE:  The patient was brought to the operating room by the  anesthesia team and endotracheal anesthesia was induced.  The patient  remained in supine position.  A roll was placed under her shoulders to  place her neck in slight extension.  Her anterior cervical region was  then prepared with Betadine scrub and Betadine solution and sterile  drapes  were applied.  I then injected the area to be incised with  Marcaine with epinephrine solution.  I used a scalpel to make a  transverse incision in the patient's left anterior neck.  I used the  Metzenbaum scissors to divide the platysma muscle and then to dissect  medial to the sternocleidomastoid muscle, jugular vein and carotid  artery.  I carefully dissected down towards the anterior cervical spine  and identified the esophagus and retracted it medially with the handheld  retractors.  We then used the Kittner swabs to clear the soft tissue  from the anterior cervical spine.  Of note, the patient's longus colli  muscles were practically touching in the midline.  We then inserted a  bent spinal needle into the upper exposed intervertebral space.  We  obtained intraoperative radiograph to confirm our location.   We then used electrocautery to detach the medial border of the longus  colli muscle bilaterally from the C5-6 intervertebral disk space.  We  inserted the Caspar self-retaining retractor for exposure.  We began by  incising the C5-6 intervertebral disk.  We performed a partial  diskectomy using the pituitary forceps and the Carlen curettes.  We then  inserted  distraction screws into C5 and 6 and distracted the interspace.  We then used a high-speed drill to decorticate the vertebral endplates  at C5-6, drill away the remainder of C5-6 intervertebral disk to drill  away some posterior spondylosis and to thin out the posterior  longitudinal ligament.  We incised the ligament with an arachnoid knife  and then removed it with a Kerrison punch, undercutting the vertebral  endplates at C5-6, decompressing the thecal sac.  We then performed a  foraminotomy about the bilateral C6 nerve roots, performing an  osteophytectomy.  At this point, we had a good decompression of the  thecal sac and the bilateral C6 nerve roots.   We now turned our attention to the arthrodesis.  We used the  trial  spacers and to determined to use a 6-mm medium Alphatec PEEK interbody  spacer.  We prefilled the spacer with a combination of local morselized  autograft bone we obtained during the decompression and Alphatec bone  graft extender.  We then inserted this prosthesis into the distracted C5-  6 interspace.  We then removed the distraction screws and there was a  good snug fit of the prosthesis in the interspace.   We now turned our attention to the anterior spinal instrumentation.  We  used a high-speed drill to remove some ventral spondylosis from the C5-6  intervertebral disk space so that the plate would lay down flat.  We  selected appropriate length Codman Slim-Loc anterior cervical plate and  laid it along the anterior aspect of the vertebral bodies at C5 and C6.  We then drilled two 12-mm holes at C5 and 2 at C6.  We then secured the  plate to the vertebral bodies by placing two 12-mm self-tapping screws  at C5 and 2 at C6.  We obtained intraoperative radiograph, but we could  not see the plate, screws or interbody prosthesis because of the  patient's body habitus, but they looked good in vivo.  We therefore  secured the screws and plate by locking each cam, completing the  instrumentation.   We then obtained hemostasis using bipolar electrocautery.  We irrigated  the wound out with bacitracin solution and we removed the retractor.  We  then inspected the esophagus for any damage; there was no apparent.  We  then reapproximated the patient's platysma muscle with interrupted 3-0  Vicryl suture, the subcutaneous tissue with interrupted 3-0 Vicryl  suture and skin with Steri-Strips and Benzoin.  The wound was then  coated with bacitracin ointment and a sterile dressing was applied.  The  drapes were removed and the patient was subsequently extubated by the  anesthesia team and transported to the postanesthesia care unit in stable condition.  All sponge, instrument and needle  counts were correct  at the end of this case.      Cristi Loron, M.D.  Electronically Signed     JDJ/MEDQ  D:  08/26/2006  T:  08/27/2006  Job:  [id number]

## 2010-12-06 NOTE — Discharge Summary (Signed)
NAMEJAMECA, Kristi Kramer                         ACCOUNT NO.:  [id number]   MEDICAL RECORD NO.:  [id number]                   PATIENT TYPE:  INP   LOCATION:  3030                                 FACILITY:  [facility]   PHYSICIAN:  Lonia Blood, M.D.                    DATE OF BIRTH:  10-16-1945   DATE OF ADMISSION:  12/03/2003  DATE OF DISCHARGE:  12/04/2003                                 DISCHARGE SUMMARY   DISCHARGE DIAGNOSES:  1. Dyspnea most likely secondary to mild chronic obstructive pulmonary     disease exacerbation.  2. Pulmonary sarcoidosis.  3. Obstructive sleep apnea.  4. Hypertension.  5. Gastroesophageal reflux disease.  6. Hypertension.  7. Obesity.  8. Chronic liver disease, question of steatosis.  9. Status post cholecystectomy.  10.      Osteoarthritis.   DISCHARGE MEDICATIONS:  1. Advair 250/50 one puff b.i.d.  2. Spiriva one puff daily.  3. Prednisone 40 mg p.o. daily.  The decision to continue or stop this will     be made by patient's pulmonologist, does have appointment tomorrow.  4. Doxycycline 100 mg p.o. daily for five days.  5. Zyrtec 5 mg p.o. q.h.s.  6. Alprazolam 0.5 mg p.o. b.i.d.  7. Niaspan 500 mg p.o. daily.  8. Aspirin 81 mg p.o. daily.  9. Afrin nasal spray two sprays in each nostril twice a day for three days.  10.      Robitussin DM 10 cubic centimeters p.o. q.4h. as needed for cough.  11.      Prilosec OTC 10 mg p.o. daily.  12.      Tylenol 650 mg p.r.n. for pain.   CONDITION ON DISCHARGE:  The patient was discharged in good condition.  At  the time of discharge, she was able to ambulate without symptoms.  Her  oxygen saturation on room air at the time of discharge was 95% and, after  ambulation, was 98% on room air.  She was instructed to follow up as  previously scheduled with her gastroenterologist and pulmonologist.  Appointments have been already scheduled for Dec 05, 2003, and Dec 06, 2003.  She will also follow up with her primary  care physician.   PROCEDURES DURING THIS ADMISSION:  No procedures were obtained.   CONSULTATIONS DURING THIS ADMISSION:  No consultations were obtained.   HISTORY OF PRESENT ILLNESS:  Kristi Kramer is a 65 year old Caucasian female  with a history of sarcoidosis, obstructive sleep apnea, obesity,  hypertension, and tobacco abuse who presents to the [facility]  Emergency Room with two to three week history of worsening coughing,  shortness of breath.  She will have spells of coughing and, during one of  these coughing spells, she passed out.  Her cough was not productive of  yellow sputum.  She was seen in the [facility] emergency room on May 13, was  sent  with some Bactrim, Tussionex, and hydrocodone.   PHYSICAL EXAMINATION ON ADMISSION:  VITAL SIGNS:  Temperature 100.6, pulse  101, respirations 20, blood pressure 98/34, saturations 80% on room air.  GENERAL:  She was fatigued, dizzy.  RESPIRATORY:  She had decreased breath sounds throughout.  HEART:  Regular rate and rhythm without murmurs, rubs, or gallops.  ABDOMEN:  Soft, nontender, nondistended.  Bowel sounds present.  EXTREMITIES:  Without edema.  Pulses present.   LABORATORY DATA:  On admission, she had a chest x-ray with questionable  lingular infiltrate.  Sodium 134, potassium 4.1, chloride 99, bicarbonate  25, BUN 13, creatinine 1.6, glucose 174.  White blood cells 10.9, eosinophil  count 10.6, hemoglobin 13.8, and platelets 249.  Bilirubin 2.3, alkaline  phosphatase 156, SGOT 174, SGPT 110, protein 6.7, albumin 3.3, calcium 9.   HOSPITAL COURSE:  Problem 1. Dyspnea.  The patient was admitted to the [facility] to the acute unit . She was given nebulizers and IV steroids  in the emergency room.  She responded very well to these and her dyspnea  improved.  She was placed on oxygen and on CPAP overnight.  At the time of  discharge, she had normal oxygen saturation and was very comfortable.  She  was also  treated with antibiotics, ceftriaxone, and azithromycin while in  the hospital and, at the time of discharge, she was placed on doxycycline to  take for five more days.  She had a chest CT with pulmonary emboli protocol.  The chest CT did not show any pulmonary emboli, did not show any significant  infiltrate in the lungs, and did not show any pneumothorax.  On the chest  CT, it was noticed that she has lymphadenopathy that is probably related to  her sarcoidosis.  At the time of discharge, she was sent home on her chronic  regimen on Advair, Spiriva, with 40 mg of prednisone and the decision to be  made to continue or not by her pulmonologist, and the doxycycline for five  days.   Problem 2. Elevated liver function tests.  The patient was seen on admission  to have an elevated total bilirubin, AST, ALT, and alkaline phosphatase.  A  liver ultrasound was obtained which shows steatosis, no cirrhosis, and  status post cholecystectomy.  As patient was already in the process of  following up with her gastroenterologist in [place], we deferred further  workup to him . This could be related to her sarcoidosis or could be related  to her obesity, but also we did not obtain hepatic serology studies.   Problem 3. Sinusitis.  The patient has been having complaints of marked  headache and, on the CAT scan, she had changes of sinusitis.  She was  discharged home on Zyrtec, Afrin, and an antibiotic to complete the course  of treatment for her sinusitis.   Problem 4. Elevated fasting glucose.  The patient had a fasting glucose of  135 on the day of discharge.  Primary care physician to follow up with  another fasting glucose to see if she does meet the criteria for diabetes or  not.   Problem 5. Sarcoidosis.  At this point, we started the patient on prednisone  and will defer the chronic treatment for this to her pulmonologist in  [place].  Problem 6. Gastroesophageal reflux disease.  The  patient was started on  proton pump inhibitor in the hospital and sent home with a prescription for  a month of  omeprazole and defer to her physicians to make a decision about  stepdown therapy, further imaging studies if necessary.                                                Lonia Blood, M.D.    SL/MEDQ  D:  12/04/2003  T:  12/05/2003  Job:  [id number]   cc:   [facility]  [place], [place]   Lynann Bologna  [address]  [place]  [place] [address]  Fax: [phone]

## 2011-03-02 ENCOUNTER — Emergency Department (HOSPITAL_COMMUNITY): Payer: Medicare Other

## 2011-03-02 ENCOUNTER — Emergency Department (HOSPITAL_COMMUNITY)
Admission: EM | Admit: 2011-03-02 | Discharge: 2011-03-02 | Disposition: A | Discharge status: Home / Self Care | Payer: Medicare Other | Attending: Emergency Medicine | Admitting: Emergency Medicine

## 2011-03-02 DIAGNOSIS — Z79899 Other long term (current) drug therapy: Secondary | ICD-10-CM | POA: Insufficient documentation

## 2011-03-02 DIAGNOSIS — M5030 Other cervical disc degeneration, unspecified cervical region: Secondary | ICD-10-CM | POA: Insufficient documentation

## 2011-03-02 DIAGNOSIS — M542 Cervicalgia: Secondary | ICD-10-CM | POA: Insufficient documentation

## 2011-03-02 DIAGNOSIS — I10 Essential (primary) hypertension: Secondary | ICD-10-CM | POA: Insufficient documentation

## 2011-03-02 DIAGNOSIS — K219 Gastro-esophageal reflux disease without esophagitis: Secondary | ICD-10-CM | POA: Insufficient documentation

## 2011-03-02 DIAGNOSIS — E785 Hyperlipidemia, unspecified: Secondary | ICD-10-CM | POA: Insufficient documentation

## 2011-03-02 DIAGNOSIS — J449 Chronic obstructive pulmonary disease, unspecified: Secondary | ICD-10-CM | POA: Insufficient documentation

## 2011-03-02 DIAGNOSIS — J4489 Other specified chronic obstructive pulmonary disease: Secondary | ICD-10-CM | POA: Insufficient documentation

## 2011-04-04 ENCOUNTER — Other Ambulatory Visit (HOSPITAL_COMMUNITY): Payer: Medicare Other

## 2011-04-09 ENCOUNTER — Inpatient Hospital Stay (HOSPITAL_COMMUNITY): Admission: RE | Admit: 2011-04-09 | Payer: Medicare Other | Source: Ambulatory Visit | Admitting: Neurosurgery

## 2011-04-11 LAB — I-STAT 8, (EC8 V) (CONVERTED LAB)
Acid-Base Excess: 1
BUN: 10
Bicarbonate: 26.8 — ABNORMAL HIGH
Chloride: 106
Glucose, Bld: 82
HCT: 43
Hemoglobin: 14.6
Sodium: 140
TCO2: 28
pCO2, Ven: 46.5
pH, Ven: 7.369 — ABNORMAL HIGH

## 2011-04-11 LAB — DIFFERENTIAL
Basophils Absolute: 0.1
Lymphocytes Relative: 20
Monocytes Absolute: 0.5
Neutro Abs: 6

## 2011-04-11 LAB — CBC
HCT: 41
Hemoglobin: 14.5
MCHC: 35.3
RBC: 4.52
WBC: 8.5

## 2011-04-11 LAB — POCT CARDIAC MARKERS
Troponin i, poc: <0.05
Troponin i, poc: <0.05

## 2011-04-11 LAB — POCT I-STAT CREATININE: Operator id: 285841

## 2011-04-30 ENCOUNTER — Encounter (HOSPITAL_COMMUNITY)
Admission: RE | Admit: 2011-04-30 | Discharge: 2011-04-30 | Disposition: A | Discharge status: Home / Self Care | Payer: Medicare Other | Source: Ambulatory Visit | Attending: Neurosurgery | Admitting: Neurosurgery

## 2011-04-30 LAB — CBC
HCT: 40.2 % (ref 36.0–46.0)
MCH: 32.6 pg (ref 26.0–34.0)
Platelets: 250 10*3/uL (ref 150–400)
RBC: 4.48 MIL/uL (ref 3.87–5.11)

## 2011-04-30 LAB — BASIC METABOLIC PANEL
BUN: 13 mg/dL (ref 6–23)
Calcium: 10.7 mg/dL — ABNORMAL HIGH (ref 8.4–10.5)
Creatinine, Ser: 0.65 mg/dL (ref 0.50–1.10)
GFR calc Af Amer: >90 mL/min (ref >90)
GFR calc non Af Amer: >90 mL/min (ref >90)
Potassium: 4.4 mEq/L (ref 3.5–5.1)

## 2011-04-30 LAB — SURGICAL PCR SCREEN
MRSA, PCR: NEGATIVE
Staphylococcus aureus: NEGATIVE

## 2011-05-05 ENCOUNTER — Inpatient Hospital Stay (HOSPITAL_COMMUNITY)
Admission: RE | Admit: 2011-05-05 | Discharge: 2011-05-07 | DRG: 473 | Disposition: A | Discharge status: Home / Self Care | Payer: Medicare Other | Source: Ambulatory Visit | Attending: Neurosurgery | Admitting: Neurosurgery

## 2011-05-05 ENCOUNTER — Inpatient Hospital Stay (HOSPITAL_COMMUNITY): Payer: Medicare Other

## 2011-05-05 DIAGNOSIS — J449 Chronic obstructive pulmonary disease, unspecified: Secondary | ICD-10-CM | POA: Diagnosis present

## 2011-05-05 DIAGNOSIS — Z472 Encounter for removal of internal fixation device: Secondary | ICD-10-CM

## 2011-05-05 DIAGNOSIS — J4489 Other specified chronic obstructive pulmonary disease: Secondary | ICD-10-CM | POA: Diagnosis present

## 2011-05-05 DIAGNOSIS — M4712 Other spondylosis with myelopathy, cervical region: Principal | ICD-10-CM | POA: Diagnosis present

## 2011-05-05 DIAGNOSIS — Z8673 Personal history of transient ischemic attack (TIA), and cerebral infarction without residual deficits: Secondary | ICD-10-CM

## 2011-05-05 DIAGNOSIS — Z79899 Other long term (current) drug therapy: Secondary | ICD-10-CM

## 2011-05-05 DIAGNOSIS — Z01812 Encounter for preprocedural laboratory examination: Secondary | ICD-10-CM

## 2011-05-05 DIAGNOSIS — G4733 Obstructive sleep apnea (adult) (pediatric): Secondary | ICD-10-CM | POA: Diagnosis present

## 2011-05-05 DIAGNOSIS — I10 Essential (primary) hypertension: Secondary | ICD-10-CM | POA: Diagnosis present

## 2011-05-05 DIAGNOSIS — Z7982 Long term (current) use of aspirin: Secondary | ICD-10-CM

## 2011-05-05 DIAGNOSIS — E669 Obesity, unspecified: Secondary | ICD-10-CM | POA: Diagnosis present

## 2011-05-05 LAB — GLUCOSE, CAPILLARY: Glucose-Capillary: 217 mg/dL — ABNORMAL HIGH (ref 70–99)

## 2011-05-06 LAB — GLUCOSE, CAPILLARY
Glucose-Capillary: 162 mg/dL — ABNORMAL HIGH (ref 70–99)
Glucose-Capillary: 150 mg/dL — ABNORMAL HIGH (ref 70–99)

## 2011-05-07 LAB — GLUCOSE, CAPILLARY: Glucose-Capillary: 170 mg/dL — ABNORMAL HIGH (ref 70–99)

## 2011-05-14 NOTE — Op Note (Signed)
Kristi Kramer, HEFFERNAN NO.:  [id number]  MEDICAL RECORD NO.:  [id number]  LOCATION:  3533                         FACILITY:  [facility]  PHYSICIAN:  Cristi Loron, M.D.DATE OF BIRTH:  [birth year]  DATE OF PROCEDURE:  05/05/2011 DATE OF DISCHARGE:                              OPERATIVE REPORT   BRIEF HISTORY:  The patient is a 65 year old white female who I performed a C5-6 anterior cervical diskectomy, fusion, and plating on several years ago.  The patient has had some chronic back pain, but developed increasing neck pain with pain into her right arm consistent with a cervical radiculopathy.  She has failed medical management and worked up with a cervical MRI which demonstrated she had spondylosis at C3-4, C4-5, and C6-7.  I discussed the various treatment options with the patient.  The patient has weighed the risks, benefits, and alternatives of surgery and decided proceed with exploration of cervical fusion with a C3-4, C4-5, and C6-7 anterior cervical diskectomy, fusion, and plating.  PREOPERATIVE DIAGNOSES:  C3-4, C4-5, C6-7 spondylosis, stenosis, cervical radiculopathy, and myelopathy.  POSTOPERATIVE DIAGNOSES:  C3-4, C4-5, C6-7 spondylosis, stenosis, cervical radiculopathy, and myelopathy.  PROCEDURES:  C3-4, C4-5, C6-7 extensive anterior cervical diskectomy/decompression; C3-4, C4-5, C6-7 anterior interbody arthrodesis with local morselized autograft bone and Actifuse bone graft extender; insertion of interbody prosthesis at C3-C4, C4-5, C6-7 (Zimmer PEEK interbody prosthesis); anterior cervical plating at C3-4, C4-5, C6- 7 with Globus titanium plate and screws; exploration of fusion.  SURGEONS:  Cristi Loron, MD  ASSISTANT:  Hilda Lias, MD  ANESTHESIA:  General endotracheal.  ESTIMATED BLOOD LOSS:  150 mL.  SPECIMENS:  None.  DRAINS:  One 10-mm flat Jackson-Pratt drain.  COMPLICATIONS:  None.  DESCRIPTION OF PROCEDURE:  The  patient was brought to the operating room by the Anesthesia Team.  General endotracheal anesthesia was induced. The patient remained in supine position.  A roll was placed under her shoulders to keep her neck in a neutral position.  Her anterior cervical region was then prepared with Betadine scrub and Betadine solution. Sterile drapes were applied.  I then injected the area to be incised with Marcaine with epinephrine solution.  I used a scalpel to make a transverse incision through the patient's previous surgical scar in her left anterior neck.  I used the Metzenbaum scissors to divide the platysma muscle and then to dissect medial to sternocleidomastoid muscle, jugular vein, and carotid artery.  We encountered the expected amount of scar tissue.  We carefully dissected through this.  We identified the esophagus and then retracted it medially.  We used Kittner swabs to clear soft tissue from anterior source of cervical spine identifying the old anterior cervical plate.  We then unlocked the cams and removed the screws and removed the plate.  We explored the fusion at C5-6 and it appeared to be solid.  We now turned our attention to the decompression.  We then used electrocautery to detach the medial border of the longus colli muscle bilaterally from C3-4, C4-5, and C6-7 interspaces.  We inserted a Caspar self-retaining retractor underneath the longus colli muscle bilaterally to provide exposure.  We began the decompression at C6-7.  I incised the C6-7 intervertebral disk with a #15 blade scalpel.  We performed a partial intervertebral diskectomy with the pituitary forceps and Karlin curettes.  We then inserted distraction screws at C6 and C7, distracted interspace, and then used high-speed drill to decorticate the vertebral endplates at C6-7, to drill away the remainder of C6-7 intervertebral disk, to drill away some posterior spondylosis, and to thin out the posterior longitudinal  ligament.  We then incised the ligament with arachnoid knife and removed it with Kerrison punch undercutting the vertebral endplates decompressing the thecal sac.  We then performed foraminotomy about the bilateral C6 and C7 nerve root completing the decompression at this level.  We then repeated this procedure in an analogous fashion at C4-5 and C3-4 decompressing the thecal sac at both levels as well as the bilateral C4 and C5 nerve roots.  This completed the decompression.  We now turned our attention to arthrodesis.  We used trial spacers and determined to use a 6-mm prosthesis at C3-C4 and 7 at C4-5 and 9-mm prosthesis at C6-7.  We prefilled this prosthesis with a combination of local autograft bone we obtained during the decompression as well as Actifuse bone graft extender.  We inserted the prosthesis into distracted interspaces.  We then removed the distraction screws and noted there was a good snug fit of the prosthesis at each level.  This completed the arthrodesis.  We now turned our attention to the anterior spinal instrumentation.  We used a high-speed drill to remove some ventral spondylosis from the vertebral endplates at C3-4, C4-5, and C6-7 so that the plate would lay down flat.  We selected the appropriate length Globus plate with one plate spanning from C3-C5 and the other at C6-7.  We then drilled two 12- mm holes at C3, 4, 5, 6, and 7.  We then secured the plate to the vertebral bodies by placing two 12-mm self-tapping screws at C3, C4, C5, C6, and C7.  We then obtained intraoperative radiograph which demonstrated good position of the upper plate, screws, and interbody prosthesis.  The lower plates and screws were hard to see because of the patient's shoulders, but it looked good in vivo.  We therefore secured the screws and the plate by locking each cam.  This completed the instrumentation.  We then obtained hemostasis using bipolar electrocautery.  We  irrigated the wound out with bacitracin solution.  We then placed a 10-mm flat Jackson-Pratt drain in the prevertebral space.  We tunneled out through separate stab wound.  We then removed the retractor and inspected the esophagus for any damage and there was none apparent.  We then reapproximated the patient's platysma muscle with interrupted 3-0 Vicryl suture, the subcu tissue with inverted interrupted 3-0 Vicryl suture, and the skin with Steri-Strips and benzoin.  The wound was then coated with bacitracin ointment.  Sterile dressing was applied.  The drapes were removed.  The patient was subsequently extubated by the Anesthesia Team and transported to Postanesthesia Care Unit in stable condition.  All sponge, instrument, and needle counts were correct at the end of this.     Cristi Loron, M.D.     JDJ/MEDQ  D:  05/05/2011  T:  05/06/2011  Job:  [id number]  Electronically Signed by Tressie Stalker M.D. on 05/14/2011 12:08:54 PM

## 2011-07-23 ENCOUNTER — Encounter: Payer: Self-pay | Admitting: Emergency Medicine

## 2011-07-23 ENCOUNTER — Emergency Department (HOSPITAL_COMMUNITY): Payer: Medicare Other

## 2011-07-23 ENCOUNTER — Inpatient Hospital Stay (HOSPITAL_COMMUNITY)
Admission: EM | Admit: 2011-07-23 | Discharge: 2011-07-25 | DRG: 312 | Disposition: A | Discharge status: Home / Self Care | Payer: Medicare Other | Attending: Internal Medicine | Admitting: Internal Medicine

## 2011-07-23 ENCOUNTER — Other Ambulatory Visit: Payer: Self-pay

## 2011-07-23 DIAGNOSIS — R590 Localized enlarged lymph nodes: Secondary | ICD-10-CM | POA: Diagnosis present

## 2011-07-23 DIAGNOSIS — R51 Headache: Secondary | ICD-10-CM | POA: Diagnosis present

## 2011-07-23 DIAGNOSIS — E876 Hypokalemia: Secondary | ICD-10-CM | POA: Diagnosis present

## 2011-07-23 DIAGNOSIS — G8929 Other chronic pain: Secondary | ICD-10-CM | POA: Diagnosis not present

## 2011-07-23 DIAGNOSIS — G4700 Insomnia, unspecified: Secondary | ICD-10-CM | POA: Diagnosis present

## 2011-07-23 DIAGNOSIS — R55 Syncope and collapse: Secondary | ICD-10-CM | POA: Diagnosis not present

## 2011-07-23 DIAGNOSIS — I10 Essential (primary) hypertension: Secondary | ICD-10-CM | POA: Diagnosis present

## 2011-07-23 DIAGNOSIS — I679 Cerebrovascular disease, unspecified: Secondary | ICD-10-CM | POA: Diagnosis present

## 2011-07-23 DIAGNOSIS — R52 Pain, unspecified: Secondary | ICD-10-CM | POA: Diagnosis not present

## 2011-07-23 DIAGNOSIS — M542 Cervicalgia: Secondary | ICD-10-CM | POA: Diagnosis present

## 2011-07-23 DIAGNOSIS — E785 Hyperlipidemia, unspecified: Secondary | ICD-10-CM | POA: Diagnosis present

## 2011-07-23 DIAGNOSIS — R079 Chest pain, unspecified: Secondary | ICD-10-CM | POA: Diagnosis not present

## 2011-07-23 DIAGNOSIS — D869 Sarcoidosis, unspecified: Secondary | ICD-10-CM

## 2011-07-23 DIAGNOSIS — J449 Chronic obstructive pulmonary disease, unspecified: Secondary | ICD-10-CM | POA: Diagnosis not present

## 2011-07-23 DIAGNOSIS — W19XXXA Unspecified fall, initial encounter: Secondary | ICD-10-CM | POA: Diagnosis present

## 2011-07-23 DIAGNOSIS — G4733 Obstructive sleep apnea (adult) (pediatric): Secondary | ICD-10-CM | POA: Diagnosis present

## 2011-07-23 DIAGNOSIS — J4489 Other specified chronic obstructive pulmonary disease: Secondary | ICD-10-CM | POA: Diagnosis present

## 2011-07-23 DIAGNOSIS — E119 Type 2 diabetes mellitus without complications: Secondary | ICD-10-CM | POA: Diagnosis present

## 2011-07-23 DIAGNOSIS — Z9181 History of falling: Secondary | ICD-10-CM | POA: Diagnosis not present

## 2011-07-23 DIAGNOSIS — R1013 Epigastric pain: Secondary | ICD-10-CM | POA: Diagnosis present

## 2011-07-23 HISTORY — DX: Essential (primary) hypertension: I10

## 2011-07-23 HISTORY — DX: Chronic obstructive pulmonary disease, unspecified: J44.9

## 2011-07-23 HISTORY — DX: Pure hypercholesterolemia, unspecified: E78.00

## 2011-07-23 LAB — URINALYSIS, ROUTINE W REFLEX MICROSCOPIC
Bilirubin Urine: NEGATIVE
Glucose, UA: NEGATIVE mg/dL
Hgb urine dipstick: NEGATIVE
Specific Gravity, Urine: 1.008 (ref 1.005–1.030)
pH: 7.5 (ref 5.0–8.0)

## 2011-07-23 LAB — CBC
HCT: 39.6 % (ref 36.0–46.0)
HCT: 39.7 % (ref 36.0–46.0)
Hemoglobin: 13.2 g/dL (ref 12.0–15.0)
Hemoglobin: 13.8 g/dL (ref 12.0–15.0)
MCHC: 33.3 g/dL (ref 30.0–36.0)
MCHC: 34.8 g/dL (ref 30.0–36.0)
MCV: 88.8 fL (ref 78.0–100.0)
RBC: 4.44 MIL/uL (ref 3.87–5.11)
RDW: 12.2 % (ref 11.5–15.5)
WBC: 6.4 10*3/uL (ref 4.0–10.5)

## 2011-07-23 LAB — COMPREHENSIVE METABOLIC PANEL
ALT: 26 U/L (ref 0–35)
AST: 28 U/L (ref 0–37)
Alkaline Phosphatase: 77 U/L (ref 39–117)
CO2: 28 mEq/L (ref 19–32)
GFR calc Af Amer: >90 mL/min (ref >90)
GFR calc non Af Amer: >90 mL/min (ref >90)
Glucose, Bld: 99 mg/dL (ref 70–99)
Potassium: 3.3 mEq/L — ABNORMAL LOW (ref 3.5–5.1)
Sodium: 140 mEq/L (ref 135–145)

## 2011-07-23 LAB — DIFFERENTIAL
Lymphocytes Relative: 22 % (ref 12–46)
Lymphs Abs: 1.4 10*3/uL (ref 0.7–4.0)
Monocytes Absolute: 0.4 10*3/uL (ref 0.1–1.0)
Monocytes Relative: 6 % (ref 3–12)
Neutro Abs: 4.4 10*3/uL (ref 1.7–7.7)

## 2011-07-23 LAB — CARDIAC PANEL(CRET KIN+CKTOT+MB+TROPI)
CK, MB: 1.8 ng/mL (ref 0.3–4.0)
Total CK: 73 U/L (ref 7–177)
Troponin I: <0.3 ng/mL (ref <0.30)

## 2011-07-23 LAB — CREATININE, SERUM: GFR calc Af Amer: >90 mL/min (ref >90)

## 2011-07-23 LAB — TROPONIN I: Troponin I: <0.3 ng/mL (ref <0.30)

## 2011-07-23 MED ORDER — IPRATROPIUM BROMIDE 0.02 % IN SOLN: 0.5000 mg | Freq: Four times a day (QID) | RESPIRATORY_TRACT | Status: DC | PRN

## 2011-07-23 MED ORDER — NITROGLYCERIN 0.4 MG SL SUBL: 0.4000 mg | SUBLINGUAL_TABLET | SUBLINGUAL | Status: DC | PRN

## 2011-07-23 MED ORDER — ALBUTEROL SULFATE (5 MG/ML) 0.5% IN NEBU: 2.5000 mg | INHALATION_SOLUTION | Freq: Four times a day (QID) | RESPIRATORY_TRACT | Status: DC | PRN

## 2011-07-23 MED ORDER — SODIUM CHLORIDE 0.9 % IV SOLN
INTRAVENOUS | Status: DC
  Administered 2011-07-23: 1000 mL via INTRAVENOUS
  Administered 2011-07-23: 23:00:00 via INTRAVENOUS
  Administered 2011-07-24: 20 mL/h via INTRAVENOUS

## 2011-07-23 MED ORDER — ONDANSETRON HCL 4 MG/2ML IJ SOLN: 4.0000 mg | Freq: Four times a day (QID) | INTRAMUSCULAR | Status: DC | PRN

## 2011-07-23 MED ORDER — ASPIRIN EC 81 MG PO TBEC
81.0000 mg | DELAYED_RELEASE_TABLET | Freq: Every day | ORAL | Status: DC
  Administered 2011-07-24 – 2011-07-25 (×2): 81 mg via ORAL
  Filled 2011-07-23 (×4): qty 1

## 2011-07-23 MED ORDER — IBUPROFEN 600 MG PO TABS
600.0000 mg | ORAL_TABLET | Freq: Four times a day (QID) | ORAL | Status: DC | PRN
  Administered 2011-07-25: 600 mg via ORAL
  Filled 2011-07-23 (×2): qty 1

## 2011-07-23 MED ORDER — FENTANYL CITRATE 0.05 MG/ML IJ SOLN
100.0000 ug | Freq: Once | INTRAMUSCULAR | Status: AC
  Administered 2011-07-23: 100 ug via INTRAVENOUS
  Filled 2011-07-23: qty 2

## 2011-07-23 MED ORDER — SORBITOL 70 % SOLN
30.0000 mL | Freq: Every day | Status: DC | PRN
  Filled 2011-07-23: qty 30

## 2011-07-23 MED ORDER — LISINOPRIL 20 MG PO TABS
20.0000 mg | ORAL_TABLET | Freq: Every day | ORAL | Status: DC
  Administered 2011-07-24 – 2011-07-25 (×2): 20 mg via ORAL
  Filled 2011-07-23 (×4): qty 1

## 2011-07-23 MED ORDER — CYCLOBENZAPRINE HCL 10 MG PO TABS
10.0000 mg | ORAL_TABLET | Freq: Three times a day (TID) | ORAL | Status: DC | PRN
  Administered 2011-07-24: 10 mg via ORAL
  Filled 2011-07-23 (×4): qty 1

## 2011-07-23 MED ORDER — FLUTICASONE-SALMETEROL 250-50 MCG/DOSE IN AEPB
1.0000 | INHALATION_SPRAY | Freq: Two times a day (BID) | RESPIRATORY_TRACT | Status: DC
  Administered 2011-07-24 – 2011-07-25 (×3): 1 via RESPIRATORY_TRACT
  Filled 2011-07-23: qty 14

## 2011-07-23 MED ORDER — LISINOPRIL-HYDROCHLOROTHIAZIDE 20-25 MG PO TABS: 1.0000 | ORAL_TABLET | Freq: Every day | ORAL | Status: DC

## 2011-07-23 MED ORDER — FENOFIBRATE 54 MG PO TABS
54.0000 mg | ORAL_TABLET | Freq: Every day | ORAL | Status: DC
  Administered 2011-07-24 – 2011-07-25 (×2): 54 mg via ORAL
  Filled 2011-07-23 (×4): qty 1

## 2011-07-23 MED ORDER — MORPHINE SULFATE 2 MG/ML IJ SOLN
5.0000 mg | INTRAMUSCULAR | Status: DC | PRN
  Administered 2011-07-24 – 2011-07-25 (×5): 5 mg via INTRAVENOUS
  Filled 2011-07-23 (×5): qty 3

## 2011-07-23 MED ORDER — ONDANSETRON HCL 4 MG/2ML IJ SOLN
4.0000 mg | Freq: Once | INTRAMUSCULAR | Status: AC
  Administered 2011-07-23: 4 mg via INTRAVENOUS
  Filled 2011-07-23: qty 2

## 2011-07-23 MED ORDER — LORAZEPAM 2 MG/ML IJ SOLN
1.0000 mg | INTRAMUSCULAR | Status: DC | PRN
  Administered 2011-07-24 (×2): 1 mg via INTRAVENOUS
  Filled 2011-07-23 (×2): qty 1

## 2011-07-23 MED ORDER — PANTOPRAZOLE SODIUM 40 MG PO TBEC
80.0000 mg | DELAYED_RELEASE_TABLET | Freq: Every day | ORAL | Status: DC
  Administered 2011-07-24 – 2011-07-25 (×3): 80 mg via ORAL
  Filled 2011-07-23 (×4): qty 2

## 2011-07-23 MED ORDER — FENTANYL CITRATE 0.05 MG/ML IJ SOLN
50.0000 ug | Freq: Once | INTRAMUSCULAR | Status: AC
  Administered 2011-07-23: 50 ug via INTRAVENOUS
  Filled 2011-07-23: qty 2

## 2011-07-23 MED ORDER — PREDNISOLONE ACETATE 1 % OP SUSP
1.0000 [drp] | Freq: Every day | OPHTHALMIC | Status: DC
  Administered 2011-07-24 – 2011-07-25 (×2): 1 [drp] via OPHTHALMIC
  Filled 2011-07-23: qty 1

## 2011-07-23 MED ORDER — ONDANSETRON HCL 4 MG PO TABS: 4.0000 mg | ORAL_TABLET | Freq: Four times a day (QID) | ORAL | Status: DC | PRN

## 2011-07-23 MED ORDER — ZOLPIDEM TARTRATE 10 MG PO TABS: 10.0000 mg | ORAL_TABLET | Freq: Every evening | ORAL | Status: DC | PRN

## 2011-07-23 MED ORDER — ALBUTEROL SULFATE HFA 108 (90 BASE) MCG/ACT IN AERS: 2.0000 | INHALATION_SPRAY | Freq: Four times a day (QID) | RESPIRATORY_TRACT | Status: DC | PRN

## 2011-07-23 MED ORDER — INSULIN ASPART 100 UNIT/ML ~~LOC~~ SOLN
0.0000 [IU] | Freq: Three times a day (TID) | SUBCUTANEOUS | Status: DC
  Administered 2011-07-24: 4 [IU] via SUBCUTANEOUS
  Filled 2011-07-23 (×2): qty 3

## 2011-07-23 MED ORDER — HYDROCHLOROTHIAZIDE 25 MG PO TABS
25.0000 mg | ORAL_TABLET | Freq: Every day | ORAL | Status: DC
  Administered 2011-07-24 – 2011-07-25 (×2): 25 mg via ORAL
  Filled 2011-07-23 (×5): qty 1

## 2011-07-23 MED ORDER — ENOXAPARIN SODIUM 40 MG/0.4ML ~~LOC~~ SOLN
40.0000 mg | Freq: Every day | SUBCUTANEOUS | Status: DC
  Administered 2011-07-25: 40 mg via SUBCUTANEOUS
  Filled 2011-07-23 (×3): qty 0.4

## 2011-07-23 MED ORDER — PROMETHAZINE HCL 25 MG/ML IJ SOLN
12.5000 mg | INTRAMUSCULAR | Status: AC
  Administered 2011-07-23: 12.5 mg via INTRAVENOUS
  Filled 2011-07-23: qty 1

## 2011-07-23 MED ORDER — METOPROLOL SUCCINATE ER 50 MG PO TB24
50.0000 mg | ORAL_TABLET | Freq: Every day | ORAL | Status: DC
  Administered 2011-07-24 – 2011-07-25 (×2): 50 mg via ORAL
  Filled 2011-07-23 (×4): qty 1

## 2011-07-23 NOTE — ED Notes (Signed)
MD at bedside. 

## 2011-07-23 NOTE — ED Notes (Signed)
Patient transported to CT 

## 2011-07-23 NOTE — ED Notes (Signed)
Bed:WA14<BR> Expected date:<BR> Expected time:<BR> Means of arrival:<BR> Comments:<BR> hold

## 2011-07-23 NOTE — ED Notes (Signed)
Per EMS, pt at neighbors house sitting in recliner and fell forward when she went to stand up. Bystanders at scene reports LOC x a few seconds; pt fully immobilized upon represent to ER; c/o neck pain and nausea; s/p neck surgery 1 month ago at [facility]; a&o x 4

## 2011-07-23 NOTE — ED Notes (Signed)
Patient is resting comfortably. 

## 2011-07-23 NOTE — ED Notes (Signed)
Pt states that she has a special neck brace that was made by Dr. Lovell Sheehan.  Pt states that she came with it, but unable to find the neck brace in ED.  Philly collar applied at this time for comfort.

## 2011-07-23 NOTE — H&P (Signed)
History and Physical Examination  Date: 07/23/2011  Patient name: Kristi Kramer Medical record number: [id number] Date of birth: [birth year] Age: 66 y.o. Gender: female PCP: Lonie Peak, PA, PA  Attending physician: Carollee Massed  Chief Complaint: Passed out  History of Present Illness: Kristi Kramer is an 66 y.o. female with hypertension diabetes mellitus, sleep apnea and cerebrovascular disease who presented to the emergency department after having a syncopal episode today.  Patient reports that she was arguing with her boyfriend today.  She became very upset.  She says that she began to experience numbness in her lips pain in the center of the chest and numbness in both arms.  The patient reports that she was able to walk to a neighbors house where she passed out.  The patient says she was only out for short amount of time.  Then she was awakened and came to the emergency department for further treatment.  The patient describes the chest pain as an aching pain in the center of the chest under the sternum and associated with a feeling of pressure.  The patient says that she also has chronic neck pain and recently had a C-spine fusion.  Patient has sleep apnea and difficulty sleeping at night.  She was treated in the emergency department and is starting to experience some improvement in symptoms.  Her symptoms of chest pressure have not completely resolved.  Hospital admission was requested for evaluation of syncopal episodes, chest pain and rule out myocardial infarction.  Past Medical History  Diagnosis Date  . Diabetes mellitus   . Hypertension   . High cholesterol   . COPD (chronic obstructive pulmonary disease)     cerebrovascular disease status post TIA 6 years ago  Obstructive sleep apnea  Sarcoidosis  Chronic insomnia  Chronic neck pain and chronic headaches  GERD  Anxiety disorder  Home Meds: Prior to Admission medications   Medication Sig Start Date End Date  Taking? Authorizing Provider  albuterol (PROVENTIL HFA;VENTOLIN HFA) 108 (90 BASE) MCG/ACT inhaler Inhale 2 puffs into the lungs every 6 (six) hours as needed. For shortness of breath.    Yes Historical Provider, MD  ALPRAZolam Prudy Feeler) 0.5 MG tablet Take 0.5-1 mg by mouth daily as needed. For anxiety.    Yes Historical Provider, MD  cyclobenzaprine (FLEXERIL) 10 MG tablet Take 10 mg by mouth 3 (three) times daily as needed. For muscle spasms.    Yes Historical Provider, MD  esomeprazole (NEXIUM) 40 MG capsule Take 40 mg by mouth 2 (two) times daily.     Yes Historical Provider, MD  fenofibrate (TRICOR) 145 MG tablet Take 145 mg by mouth daily.     Yes Historical Provider, MD  Fluticasone-Salmeterol (ADVAIR) 250-50 MCG/DOSE AEPB Inhale 1 puff into the lungs every 12 (twelve) hours.     Yes Historical Provider, MD  ibuprofen (ADVIL,MOTRIN) 200 MG tablet Take 400 mg by mouth every 6 (six) hours as needed. For pain.    Yes Historical Provider, MD  lisinopril-hydrochlorothiazide (PRINZIDE,ZESTORETIC) 20-25 MG per tablet Take 1 tablet by mouth daily.     Yes Historical Provider, MD  metFORMIN (GLUCOPHAGE-XR) 500 MG 24 hr tablet Take 1,000 mg by mouth daily.     Yes Historical Provider, MD  metoprolol (TOPROL-XL) 50 MG 24 hr tablet Take 50 mg by mouth daily.     Yes Historical Provider, MD  oxyCODONE-acetaminophen (PERCOCET) 10-325 MG per tablet Take 0.5-1 tablets by mouth every 6 (six) hours as needed. For pain.  Yes Historical Provider, MD  prednisoLONE acetate (PRED FORTE) 1 % ophthalmic suspension Place 1 drop into both eyes daily.    Yes Historical Provider, MD    Allergies: Codeine; Nasonex; and Sulfa antibiotics  Social History:  History   Social History  . Marital Status: Divorced    Spouse Name: N/A    Number of Children: N/A  . Years of Education: N/A   Occupational History  . Not on file.   Social History Main Topics  . Smoking status: Never Smoker   . Smokeless tobacco: Not on  file  . Alcohol Use: No  . Drug Use: No  . Sexually Active:    Other Topics Concern  . Not on file   Social History Narrative  . No narrative on file   Family History: No family history on file. Past Surgical History:  Past Surgical History  Procedure Date  . Neck surgery     Review of Systems: A comprehensive review of systems was negative except for: Systems listed in the History of present illness  Physical Exam: Blood pressure 157/101, pulse 71, temperature 97.9 F (36.6 C), temperature source Oral, resp. rate 14, SpO2 97.00%. General appearance: alert, cooperative, appears older than stated age, mild distress and morbidly obese Head: Normocephalic, without obvious abnormality, atraumatic Eyes: conjunctivae/corneas clear. PERRL, EOM's intact. Fundi benign. Nose: Nares normal. Septum midline. Mucosa normal. No drainage or sinus tenderness., no discharge Throat: Dry mucous membranes Neck: thyroid not enlarged, symmetric, no tenderness/mass/nodules and Patient wearing neck brace Lungs: wheezes bibasilar Heart: S1, S2 normal Abdomen: soft, non-tender; bowel sounds normal; no masses,  no organomegaly Extremities: Trace pretibial edema bilateral lower extremities Skin: Skin color, texture, turgor normal. No rashes or lesions Neurologic: Grossly normal  Lab  And Imaging results:  Results for orders placed during the hospital encounter of 07/23/11 (from the past 48 hour(s))  URINALYSIS, ROUTINE W REFLEX MICROSCOPIC     Status: Normal   Collection Time   07/23/11  5:03 PM      Component Value Range Comment   Color, Urine YELLOW  YELLOW     APPearance CLEAR  CLEAR     Specific Gravity, Urine 1.008  1.005 - 1.030     pH 7.5  5.0 - 8.0     Glucose, UA NEGATIVE  NEGATIVE (mg/dL)    Hgb urine dipstick NEGATIVE  NEGATIVE     Bilirubin Urine NEGATIVE  NEGATIVE     Ketones, ur NEGATIVE  NEGATIVE (mg/dL)    Protein, ur NEGATIVE  NEGATIVE (mg/dL)    Urobilinogen, UA 0.2  0.0 - 1.0  (mg/dL)    Nitrite NEGATIVE  NEGATIVE     Leukocytes, UA NEGATIVE  NEGATIVE  MICROSCOPIC NOT DONE ON URINES WITH NEGATIVE PROTEIN, BLOOD, LEUKOCYTES, NITRITE, OR GLUCOSE <1000 mg/dL.  CBC     Status: Normal   Collection Time   07/23/11  5:05 PM      Component Value Range Comment   WBC 6.4  4.0 - 10.5 (K/uL)    RBC 4.44  3.87 - 5.11 (MIL/uL)    Hemoglobin 13.2  12.0 - 15.0 (g/dL)    HCT 62.1  30.8 - 65.7 (%)    MCV 89.2  78.0 - 100.0 (fL)    MCH 29.7  26.0 - 34.0 (pg)    MCHC 33.3  30.0 - 36.0 (g/dL)    RDW 84.6  96.2 - 95.2 (%)    Platelets 255  150 - 400 (K/uL)   DIFFERENTIAL  Status: Normal   Collection Time   07/23/11  5:05 PM      Component Value Range Comment   Neutrophils Relative 69  43 - 77 (%)    Neutro Abs 4.4  1.7 - 7.7 (K/uL)    Lymphocytes Relative 22  12 - 46 (%)    Lymphs Abs 1.4  0.7 - 4.0 (K/uL)    Monocytes Relative 6  3 - 12 (%)    Monocytes Absolute 0.4  0.1 - 1.0 (K/uL)    Eosinophils Relative 3  0 - 5 (%)    Eosinophils Absolute 0.2  0.0 - 0.7 (K/uL)    Basophils Relative 0  0 - 1 (%)    Basophils Absolute 0.0  0.0 - 0.1 (K/uL)   TROPONIN I     Status: Normal   Collection Time   07/23/11  5:05 PM      Component Value Range Comment   Troponin I <0.30  <0.30 (ng/mL)   COMPREHENSIVE METABOLIC PANEL     Status: Abnormal   Collection Time   07/23/11  5:05 PM      Component Value Range Comment   Sodium 140  135 - 145 (mEq/L)    Potassium 3.3 (*) 3.5 - 5.1 (mEq/L)    Chloride 102  96 - 112 (mEq/L)    CO2 28  19 - 32 (mEq/L)    Glucose, Bld 99  70 - 99 (mg/dL)    BUN 12  6 - 23 (mg/dL)    Creatinine, Ser 1.61  0.50 - 1.10 (mg/dL)    Calcium 9.9  8.4 - 10.5 (mg/dL)    Total Protein 7.1  6.0 - 8.3 (g/dL)    Albumin 3.9  3.5 - 5.2 (g/dL)    AST 28  0 - 37 (U/L)    ALT 26  0 - 35 (U/L)    Alkaline Phosphatase 77  39 - 117 (U/L)    Total Bilirubin 0.2 (*) 0.3 - 1.2 (mg/dL)    GFR calc non Af Amer >90  >90 (mL/min)    GFR calc Af Amer >90  >90 (mL/min)     LIPASE, BLOOD     Status: Normal   Collection Time   07/23/11  5:05 PM      Component Value Range Comment   Lipase 13  11 - 59 (U/L)    Dg Chest 2 View  07/23/2011  *RADIOLOGY REPORT*  Clinical Data: Syncope with fall today.  CHEST - 2 VIEW  Comparison: 05/07/2010 and 12/21/2009.  CT 02/16/2009.  Findings: The heart size and mediastinal contours are stable. There is mild chronic interstitial prominence.  No airspace disease or edema is identified.  Density at the left costophrenic angle appears slightly greater.  In correlation with prior studies, this is a chronic finding related to pleural thickening. The current difference is likely projectional.  No significant pleural effusion is identified.  There are postsurgical changes status post lower cervical fusion and possible distal clavicle resection bilaterally.  IMPRESSION: Chronic pleural thickening at the left costophrenic angle appears slightly greater, although this may be projectional.  No acute cardiopulmonary process demonstrated.  Original Report Authenticated By: Gerrianne Scale, M.D.   Ct Head Wo Contrast  07/23/2011  *RADIOLOGY REPORT*  Clinical Data:  Syncope.  Resulting fall.  CT HEAD WITHOUT CONTRAST CT CERVICAL SPINE WITHOUT CONTRAST  Technique:  Multidetector CT imaging of the head and cervical spine was performed following the standard protocol without intravenous contrast.  Multiplanar CT  image reconstructions of the cervical spine were also generated.  Comparison:  Head CT 09/16/2007.  CT temporal bones 06/20/2009. Cervical spine MRI 03/02/2011.  CT HEAD  Findings: There is no evidence of acute intracranial hemorrhage, mass lesion, brain edema or extra-axial fluid collection.  The ventricles and subarachnoid spaces are appropriately sized for age.  There is a chronic probable mucous retention cyst in the right maxillary sinus.  The additional paranasal sinuses are well- aerated.  There are postsurgical changes status post left  mastoidectomy.  The mastoid air cells on the right are partially opacified, similar to the prior examination.  No coalescence is identified.  There is no evidence of acute fracture.  IMPRESSION:  1.  No acute intracranial or calvarial findings. 2.  Chronic right mastoid opacification with interval postsurgical changes on the left.  CT CERVICAL SPINE  Findings: The cervical alignment is normal.  The patient is status post fusion from C3-C7.  There is an anterior plate and screws extending from C3-C5.  There is a separate plate and screws at C6- C7.  Hardware appears well positioned.  There is no evidence of hardware loosening.  Solid interbody fusion is present at each level.  There is no evidence of acute fracture or traumatic subluxation.  No acute soft tissue findings are identified.  Mastoid findings are described above.  IMPRESSION:  1.  No evidence of acute cervical spine fracture, traumatic subluxation or static signs instability. 2.  Status post cervical fusion from C3-C7.  No hardware displacement or malalignment identified.  Original Report Authenticated By: Gerrianne Scale, M.D.   Ct Cervical Spine Wo Contrast  07/23/2011  *RADIOLOGY REPORT*  Clinical Data:  Syncope.  Resulting fall.  CT HEAD WITHOUT CONTRAST CT CERVICAL SPINE WITHOUT CONTRAST  Technique:  Multidetector CT imaging of the head and cervical spine was performed following the standard protocol without intravenous contrast.  Multiplanar CT image reconstructions of the cervical spine were also generated.  Comparison:  Head CT 09/16/2007.  CT temporal bones 06/20/2009. Cervical spine MRI 03/02/2011.  CT HEAD  Findings: There is no evidence of acute intracranial hemorrhage, mass lesion, brain edema or extra-axial fluid collection.  The ventricles and subarachnoid spaces are appropriately sized for age.  There is a chronic probable mucous retention cyst in the right maxillary sinus.  The additional paranasal sinuses are well- aerated.  There  are postsurgical changes status post left mastoidectomy.  The mastoid air cells on the right are partially opacified, similar to the prior examination.  No coalescence is identified.  There is no evidence of acute fracture.  IMPRESSION:  1.  No acute intracranial or calvarial findings. 2.  Chronic right mastoid opacification with interval postsurgical changes on the left.  CT CERVICAL SPINE  Findings: The cervical alignment is normal.  The patient is status post fusion from C3-C7.  There is an anterior plate and screws extending from C3-C5.  There is a separate plate and screws at C6- C7.  Hardware appears well positioned.  There is no evidence of hardware loosening.  Solid interbody fusion is present at each level.  There is no evidence of acute fracture or traumatic subluxation.  No acute soft tissue findings are identified.  Mastoid findings are described above.  IMPRESSION:  1.  No evidence of acute cervical spine fracture, traumatic subluxation or static signs instability. 2.  Status post cervical fusion from C3-C7.  No hardware displacement or malalignment identified.  Original Report Authenticated By: Gerrianne Scale, M.D.  EKG Results:  Orders placed during the hospital encounter of 07/23/11  . ED EKG  . ED EKG   Impression  Principal Problem:  *Syncopal episodes Active Problems:  Chronic neck pain  Fall  Epigastric abdominal pain  Type II or unspecified type diabetes mellitus without mention of complication, not stated as uncontrolled  Unspecified essential hypertension  Hyperlipidemia LDL goal < 100  COPD (chronic obstructive pulmonary disease)  Chronic headache  Hypokalemia  OSA (obstructive sleep apnea)  Insomnia  Cerebrovascular disease  Sarcoidosis  hypokalemia  Chest pain  Plan  Admit to telemetry, cycle cardiac enzymes to rule out myocardial infarction, monitor electrolytes, replace potassium, monitor blood glucose closely, resume home medications for hypertension,  continue CPAP at night, treat pain with IV pain medications and insomnia.  Unfortunately the patient's custom neck brace was lost in the emergency department we will attempt to speak with her neurosurgeon about getting an order for a new custom neck brace, medications for anxiety will be ordered as well.  Diabetic diet recommended.  Check echocardiogram, carotid Doppler studies, please see orders.  Clanford Laural Benes [phone] 07/23/2011, 8:59 PM

## 2011-07-23 NOTE — ED Provider Notes (Signed)
History     CSN: [id number]  Arrival date & time 07/23/11  1611   First MD Initiated Contact with Patient 07/23/11 1625      Chief Complaint  Patient presents with  . Fall  . Neck Pain  . Nausea    (Consider location/radiation/quality/duration/timing/severity/associated sxs/prior treatment) Patient is a 66 y.o. female presenting with fall and neck pain. The history is provided by the patient.  Fall The accident occurred less than 1 hour ago. Associated symptoms include nausea, vomiting and headaches. Pertinent negatives include no numbness and no abdominal pain.  Neck Pain  Associated symptoms include headaches. Pertinent negatives include no chest pain, no numbness and no weakness.   patient is unsure what happened, was reportedly sitting in a chair pain resolves and then fell forward. Patient states she was feeling chest pain and lightheadedness before she got up out of a chair. States she was unconscious for a few seconds. She states she is nauseous. She has been vomiting here. She neck surgery in October at [facility]. No new neck pain. No numbness or weakness. No abdominal pain. She states she is a little chest pressure. She states it welled up in her chest. No diarrhea. No dysuria.  Past Medical History  Diagnosis Date  . Diabetes mellitus   . Hypertension   . High cholesterol   . COPD (chronic obstructive pulmonary disease)     Past Surgical History  Procedure Date  . Neck surgery     No family history on file.  History  Substance Use Topics  . Smoking status: Never Smoker   . Smokeless tobacco: Not on file  . Alcohol Use: No    OB History    Grav Para Term Preterm Abortions TAB SAB Ect Mult Living                  Review of Systems  Constitutional: Negative for activity change and appetite change.  HENT: Positive for neck pain. Negative for neck stiffness.   Eyes: Negative for pain.  Respiratory: Negative for chest tightness and shortness of breath.     Cardiovascular: Negative for chest pain and leg swelling.  Gastrointestinal: Positive for nausea and vomiting. Negative for abdominal pain and diarrhea.  Genitourinary: Negative for flank pain.  Musculoskeletal: Negative for back pain.       Chronic neck pain  Skin: Negative for rash.  Neurological: Positive for dizziness, syncope and headaches. Negative for weakness and numbness.  Psychiatric/Behavioral: Negative for behavioral problems.    Allergies  Codeine; Nasonex; and Sulfa antibiotics  Home Medications   Current Outpatient Rx  Name Route Sig Dispense Refill  . ALBUTEROL SULFATE HFA 108 (90 BASE) MCG/ACT IN AERS Inhalation Inhale 2 puffs into the lungs every 6 (six) hours as needed. For shortness of breath.     . ALPRAZOLAM 0.5 MG PO TABS Oral Take 0.5-1 mg by mouth daily as needed. For anxiety.     . CYCLOBENZAPRINE HCL 10 MG PO TABS Oral Take 10 mg by mouth 3 (three) times daily as needed. For muscle spasms.     Marland Kitchen ESOMEPRAZOLE MAGNESIUM 40 MG PO CPDR Oral Take 40 mg by mouth 2 (two) times daily.      . FENOFIBRATE 145 MG PO TABS Oral Take 145 mg by mouth daily.      Marland Kitchen FLUTICASONE-SALMETEROL 250-50 MCG/DOSE IN AEPB Inhalation Inhale 1 puff into the lungs every 12 (twelve) hours.      . IBUPROFEN 200 MG PO TABS  Oral Take 400 mg by mouth every 6 (six) hours as needed. For pain.     Marland Kitchen LISINOPRIL-HYDROCHLOROTHIAZIDE 20-25 MG PO TABS Oral Take 1 tablet by mouth daily.      Marland Kitchen METFORMIN HCL ER 500 MG PO TB24 Oral Take 1,000 mg by mouth daily.      Marland Kitchen METOPROLOL SUCCINATE ER 50 MG PO TB24 Oral Take 50 mg by mouth daily.      . OXYCODONE-ACETAMINOPHEN 10-325 MG PO TABS Oral Take 0.5-1 tablets by mouth every 6 (six) hours as needed. For pain.     Marland Kitchen PREDNISOLONE ACETATE 1 % OP SUSP Both Eyes Place 1 drop into both eyes daily.       BP 157/101  Pulse 71  Temp(Src) 97.9 F (36.6 C) (Oral)  Resp 14  SpO2 97%  Physical Exam  Nursing note and vitals reviewed. Constitutional: She is  oriented to person, place, and time. She appears well-developed and well-nourished.       Patient is obese  HENT:  Head: Normocephalic and atraumatic.  Eyes: EOM are normal. Pupils are equal, round, and reactive to light.  Neck: Normal range of motion. Neck supple.  Cardiovascular: Normal rate, regular rhythm and normal heart sounds.   No murmur heard. Pulmonary/Chest: Effort normal and breath sounds normal. No respiratory distress. She has no wheezes. She has no rales.  Abdominal: Soft. Bowel sounds are normal. She exhibits no distension. There is no tenderness. There is no rebound and no guarding.  Musculoskeletal: Normal range of motion.       No tenderness to cervical spine.  Neurological: She is alert and oriented to person, place, and time. No cranial nerve deficit.  Skin: Skin is warm and dry.  Psychiatric: She has a normal mood and affect. Her speech is normal.    ED Course  Procedures (including critical care time)  Labs Reviewed  COMPREHENSIVE METABOLIC PANEL - Abnormal; Notable for the following:    Potassium 3.3 (*)    Total Bilirubin 0.2 (*)    All other components within normal limits  CBC  DIFFERENTIAL  URINALYSIS, ROUTINE W REFLEX MICROSCOPIC  TROPONIN I  LIPASE, BLOOD   Dg Chest 2 View  07/23/2011  *RADIOLOGY REPORT*  Clinical Data: Syncope with fall today.  CHEST - 2 VIEW  Comparison: 05/07/2010 and 12/21/2009.  CT 02/16/2009.  Findings: The heart size and mediastinal contours are stable. There is mild chronic interstitial prominence.  No airspace disease or edema is identified.  Density at the left costophrenic angle appears slightly greater.  In correlation with prior studies, this is a chronic finding related to pleural thickening. The current difference is likely projectional.  No significant pleural effusion is identified.  There are postsurgical changes status post lower cervical fusion and possible distal clavicle resection bilaterally.  IMPRESSION: Chronic  pleural thickening at the left costophrenic angle appears slightly greater, although this may be projectional.  No acute cardiopulmonary process demonstrated.  Original Report Authenticated By: Gerrianne Scale, M.D.   Ct Head Wo Contrast  07/23/2011  *RADIOLOGY REPORT*  Clinical Data:  Syncope.  Resulting fall.  CT HEAD WITHOUT CONTRAST CT CERVICAL SPINE WITHOUT CONTRAST  Technique:  Multidetector CT imaging of the head and cervical spine was performed following the standard protocol without intravenous contrast.  Multiplanar CT image reconstructions of the cervical spine were also generated.  Comparison:  Head CT 09/16/2007.  CT temporal bones 06/20/2009. Cervical spine MRI 03/02/2011.  CT HEAD  Findings: There is no evidence  of acute intracranial hemorrhage, mass lesion, brain edema or extra-axial fluid collection.  The ventricles and subarachnoid spaces are appropriately sized for age.  There is a chronic probable mucous retention cyst in the right maxillary sinus.  The additional paranasal sinuses are well- aerated.  There are postsurgical changes status post left mastoidectomy.  The mastoid air cells on the right are partially opacified, similar to the prior examination.  No coalescence is identified.  There is no evidence of acute fracture.  IMPRESSION:  1.  No acute intracranial or calvarial findings. 2.  Chronic right mastoid opacification with interval postsurgical changes on the left.  CT CERVICAL SPINE  Findings: The cervical alignment is normal.  The patient is status post fusion from C3-C7.  There is an anterior plate and screws extending from C3-C5.  There is a separate plate and screws at C6- C7.  Hardware appears well positioned.  There is no evidence of hardware loosening.  Solid interbody fusion is present at each level.  There is no evidence of acute fracture or traumatic subluxation.  No acute soft tissue findings are identified.  Mastoid findings are described above.  IMPRESSION:  1.  No  evidence of acute cervical spine fracture, traumatic subluxation or static signs instability. 2.  Status post cervical fusion from C3-C7.  No hardware displacement or malalignment identified.  Original Report Authenticated By: Gerrianne Scale, M.D.   Ct Cervical Spine Wo Contrast  07/23/2011  *RADIOLOGY REPORT*  Clinical Data:  Syncope.  Resulting fall.  CT HEAD WITHOUT CONTRAST CT CERVICAL SPINE WITHOUT CONTRAST  Technique:  Multidetector CT imaging of the head and cervical spine was performed following the standard protocol without intravenous contrast.  Multiplanar CT image reconstructions of the cervical spine were also generated.  Comparison:  Head CT 09/16/2007.  CT temporal bones 06/20/2009. Cervical spine MRI 03/02/2011.  CT HEAD  Findings: There is no evidence of acute intracranial hemorrhage, mass lesion, brain edema or extra-axial fluid collection.  The ventricles and subarachnoid spaces are appropriately sized for age.  There is a chronic probable mucous retention cyst in the right maxillary sinus.  The additional paranasal sinuses are well- aerated.  There are postsurgical changes status post left mastoidectomy.  The mastoid air cells on the right are partially opacified, similar to the prior examination.  No coalescence is identified.  There is no evidence of acute fracture.  IMPRESSION:  1.  No acute intracranial or calvarial findings. 2.  Chronic right mastoid opacification with interval postsurgical changes on the left.  CT CERVICAL SPINE  Findings: The cervical alignment is normal.  The patient is status post fusion from C3-C7.  There is an anterior plate and screws extending from C3-C5.  There is a separate plate and screws at C6- C7.  Hardware appears well positioned.  There is no evidence of hardware loosening.  Solid interbody fusion is present at each level.  There is no evidence of acute fracture or traumatic subluxation.  No acute soft tissue findings are identified.  Mastoid findings  are described above.  IMPRESSION:  1.  No evidence of acute cervical spine fracture, traumatic subluxation or static signs instability. 2.  Status post cervical fusion from C3-C7.  No hardware displacement or malalignment identified.  Original Report Authenticated By: Gerrianne Scale, M.D.     1. Syncope   2. Fall   3. Headache      Date: 07/23/2011  Rate: 74  Rhythm: normal sinus rhythm  QRS Axis: normal  Intervals:  normal  ST/T Wave abnormalities: normal  Conduction Disutrbances:none  Narrative Interpretation:   Old EKG Reviewed: unchanged    MDM  Patient with syncope and fall. She states she hit her head. Head and neck pain. Negative CAT scans. EKG is stable. Lab work is reassuring. She will be admitted due to the syncope.        Juliet Rude. Rubin Payor, MD 07/23/11 2102

## 2011-07-24 ENCOUNTER — Encounter (HOSPITAL_COMMUNITY): Payer: Self-pay | Admitting: *Deleted

## 2011-07-24 DIAGNOSIS — R079 Chest pain, unspecified: Secondary | ICD-10-CM | POA: Diagnosis not present

## 2011-07-24 DIAGNOSIS — M542 Cervicalgia: Secondary | ICD-10-CM | POA: Diagnosis not present

## 2011-07-24 DIAGNOSIS — R51 Headache: Secondary | ICD-10-CM | POA: Diagnosis present

## 2011-07-24 DIAGNOSIS — R1013 Epigastric pain: Secondary | ICD-10-CM | POA: Diagnosis present

## 2011-07-24 DIAGNOSIS — G8929 Other chronic pain: Secondary | ICD-10-CM | POA: Diagnosis present

## 2011-07-24 DIAGNOSIS — E785 Hyperlipidemia, unspecified: Secondary | ICD-10-CM | POA: Diagnosis present

## 2011-07-24 DIAGNOSIS — D869 Sarcoidosis, unspecified: Secondary | ICD-10-CM | POA: Diagnosis present

## 2011-07-24 DIAGNOSIS — E119 Type 2 diabetes mellitus without complications: Secondary | ICD-10-CM | POA: Diagnosis not present

## 2011-07-24 DIAGNOSIS — G4733 Obstructive sleep apnea (adult) (pediatric): Secondary | ICD-10-CM | POA: Diagnosis present

## 2011-07-24 DIAGNOSIS — J449 Chronic obstructive pulmonary disease, unspecified: Secondary | ICD-10-CM | POA: Diagnosis present

## 2011-07-24 DIAGNOSIS — R55 Syncope and collapse: Secondary | ICD-10-CM | POA: Diagnosis not present

## 2011-07-24 LAB — LIPID PANEL
Cholesterol: 176 mg/dL (ref 0–200)
LDL Cholesterol: 94 mg/dL (ref 0–99)
Total CHOL/HDL Ratio: 4.6 RATIO
VLDL: 44 mg/dL — ABNORMAL HIGH (ref 0–40)

## 2011-07-24 LAB — COMPREHENSIVE METABOLIC PANEL
AST: 23 U/L (ref 0–37)
Alkaline Phosphatase: 73 U/L (ref 39–117)
CO2: 28 mEq/L (ref 19–32)
Chloride: 104 mEq/L (ref 96–112)
Creatinine, Ser: 0.63 mg/dL (ref 0.50–1.10)
GFR calc non Af Amer: >90 mL/min (ref >90)
Potassium: 3.9 mEq/L (ref 3.5–5.1)
Total Bilirubin: 0.4 mg/dL (ref 0.3–1.2)

## 2011-07-24 LAB — GLUCOSE, CAPILLARY
Glucose-Capillary: 103 mg/dL — ABNORMAL HIGH (ref 70–99)
Glucose-Capillary: 81 mg/dL (ref 70–99)

## 2011-07-24 LAB — CARDIAC PANEL(CRET KIN+CKTOT+MB+TROPI)
CK, MB: 1.7 ng/mL (ref 0.3–4.0)
Relative Index: INVALID (ref 0.0–2.5)
Total CK: 56 U/L (ref 7–177)
Troponin I: <0.3 ng/mL (ref <0.30)

## 2011-07-24 LAB — CBC
MCH: 31.2 pg (ref 26.0–34.0)
MCHC: 34.7 g/dL (ref 30.0–36.0)
MCV: 90 fL (ref 78.0–100.0)
Platelets: 262 10*3/uL (ref 150–400)
RBC: 4.39 MIL/uL (ref 3.87–5.11)
RDW: 12.3 % (ref 11.5–15.5)

## 2011-07-24 LAB — HEMOGLOBIN A1C: Hgb A1c MFr Bld: 6.1 % — ABNORMAL HIGH (ref <5.7)

## 2011-07-24 MED ORDER — PNEUMOCOCCAL VAC POLYVALENT 25 MCG/0.5ML IJ INJ
0.5000 mL | INJECTION | INTRAMUSCULAR | Status: AC
  Administered 2011-07-25: 0.5 mL via INTRAMUSCULAR
  Filled 2011-07-24: qty 0.5

## 2011-07-24 NOTE — Progress Notes (Signed)
Occupational Therapy Evaluation Patient Details Name: Kristi Kramer MRN: [id number] DOB: [birth year] Today's Date: 07/24/2011   [id number] Problem List:  Patient Active Problem List  Diagnoses  . Syncopal episodes  . Chronic neck pain  . Fall  . Epigastric abdominal pain  . Type II or unspecified type diabetes mellitus without mention of complication, not stated as uncontrolled  . Unspecified essential hypertension  . Hyperlipidemia LDL goal < 100  . COPD (chronic obstructive pulmonary disease)  . Chronic headache  . Hypokalemia  . OSA (obstructive sleep apnea)  . Insomnia  . Cerebrovascular disease  . Sarcoidosis    Past Medical History:  Past Medical History  Diagnosis Date  . Diabetes mellitus   . Hypertension   . High cholesterol   . COPD (chronic obstructive pulmonary disease)    Past Surgical History:  Past Surgical History  Procedure Date  . Neck surgery   . Back surgery     OT Assessment/Plan/Recommendation OT Assessment Clinical Impression Statement: this 66 year old female was admitted with syncopal episode and has h/o cervical sx approximately 2 months ago.  She was mod I to I with adls and feels she will probably return home I'ly as boyfriend likely will not be there.  Pt is overall supervision for adls and min guard for ambulating in the adl context.  Will follow in acute with mod I goals.  Do not feel she'll need follow up OT beyond home safety check. OT Recommendation/Assessment: Patient will need skilled OT in the acute care venue OT Problem List: Impaired balance (sitting and/or standing);Decreased strength;Decreased safety awareness;Decreased activity tolerance Barriers to Discharge: None OT Therapy Diagnosis : Generalized weakness OT Plan OT Frequency: Min 2X/week OT Treatment/Interventions: Self-care/ADL training;DME and/or AE instruction;Therapeutic activities;Patient/family education;Balance training OT Recommendation Follow Up Recommendations:  Other (comment) (home safety check) Equipment Recommended: None recommended by OT Individuals Consulted Consulted and Agree with Results and Recommendations: Patient OT Goals Acute Rehab OT Goals OT Goal Formulation: With patient Time For Goal Achievement: 2 weeks ADL Goals Pt Will Transfer to Toilet: with modified independence ADL Goal: Toilet Transfer - Progress: Not met Pt Will Perform Toileting - Clothing Manipulation: Independently Pt Will Perform Toileting - Hygiene: Independently ADL Goal: Toileting - Hygiene - Progress: Not met Pt Will Perform Tub/Shower Transfer: with modified independence;Shower transfer ADL Goal: Web designer - Progress: Not met Miscellaneous OT Goals Miscellaneous OT Goal #1: pt will gather clothes at mod I level, with AD prn OT Goal: Miscellaneous Goal #1 - Progress: Not met  OT Evaluation Precautions/Restrictions  Precautions Precaution Comments: uses neck brace prn:  had sx approximately 2 months ago Restrictions Weight Bearing Restrictions: No Prior Functioning Home Living Lives With: Other (Comment) (boyfriend, but feels he will be gone when she returns) [place] Help From:  (neighbor and she help each other; has children--busy) Type of Home: House Home Layout: One level Home Access: Other (comment) (threshold) Bathroom Shower/Tub: Health visitor: Standard Home Adaptive Equipment: Straight cane;Other (comment) (has put resin chair in shower before) Prior Function Level of Independence: Independent with basic ADLs;Independent with homemaking with ambulation;Requires assistive device for independence (did what she could; frozen dinners, etc.  Cane in community) Driving: No ADL ADL Eating/Feeding: Simulated;Independent Where Assessed - Eating/Feeding: Edge of bed Grooming: Performed;Denture care;Supervision/safety Where Assessed - Grooming: Standing at sink Upper Body Bathing: Simulated;Set up Where Assessed - Upper  Body Bathing: Supported;Sitting, chair Lower Body Bathing: Simulated;Supervision/safety Where Assessed - Lower Body Bathing: Sit to stand  from chair Upper Body Dressing: Minimal assistance;Performed;Other (comment) (iv) Where Assessed - Upper Body Dressing: Unsupported;Sitting, bed Lower Body Dressing: Simulated;Set up Where Assessed - Lower Body Dressing: Sit to stand from chair Toilet Transfer: Performed;Other (comment) (min guard) Toilet Transfer Method: Proofreader: Comfort height toilet Toileting - Clothing Manipulation: Simulated;Supervision/safety Where Assessed - Toileting Clothing Manipulation: Standing Toileting - Hygiene: Performed;Supervision/safety Where Assessed - Toileting Hygiene: Standing ADL Comments: pt very motivated.  Moves quickly;  assist and min cues for iv Vision/Perception  Vision - History Patient Visual Report: No change from baseline Cognition Cognition Overall Cognitive Status: Appears within functional limits for tasks assessed (min cues to wait, turn L or R secondary to IV) Cognition - Other Comments: decreased awareness of IV line Sensation/Coordination Sensation Light Touch:  (states ue sensation wfls) Extremity Assessment RUE Assessment RUE Assessment: Within Functional Limits LUE Assessment LUE Assessment: Within Functional Limits Mobility  Bed Mobility Bed Mobility: Yes Supine to Sit: 7: Independent Transfers Transfers: Yes Sit to Stand: 5: Supervision Stand to Sit: 5: Supervision Stand to Sit Details:  (co eval with PT) Exercises   End of Session OT - End of Session Equipment Utilized During Treatment: Gait belt;Cervical collar Activity Tolerance: Patient tolerated treatment well Patient left: in bed;with call bell in reach General Behavior During Session: [facility] for tasks performed Cognition: [facility] for tasks performed (min cues for safety with lines)   Kristi Kramer [phone] 07/24/2011, 10:03 AM

## 2011-07-24 NOTE — Progress Notes (Signed)
  Echocardiogram 2D Echocardiogram has been performed.  Juanita Laster Devan Danzer, RDCS 07/24/2011, 3:08 PM

## 2011-07-24 NOTE — Progress Notes (Signed)
*  PRELIMINARY RESULTS* Carotid duplex completed. No evidence of extracranial carotid artery stenosis. Vertebral arteries are patent and flow is antegrade.  Ailyn Gladd, [place] D 07/24/2011, 11:47 AM

## 2011-07-24 NOTE — Progress Notes (Signed)
CARE MANAGEMENT NOTE 07/24/2011  Patient:  Kristi Kramer, Kristi Kramer   Account Number:  [id number]  Date Initiated:  07/24/2011  Documentation initiated by:  Danyele Smejkal  Subjective/Objective Assessment:   syncopal episode at home,known history of copd, htn and diabetes     Action/Plan:   lives at home   Anticipated DC Date:  07/27/2011   Anticipated DC Plan:  HOME/SELF CARE  In-house referral  NA      DC Planning Services  NA      [facility] Choice  NA   Choice offered to / List presented to:  NA   DME arranged  NA      DME agency  NA     HH arranged  NA      Status of service:  In process, will continue to follow Medicare Important Message given?  YES (If response is "NO", the following Medicare IM given date fields will be blank) Date Medicare IM given:  07/23/2011 Date Additional Medicare IM given:    Discharge Disposition:    Per UR Regulation:  Reviewed for med. necessity/level of care/duration of stay  Comments:  [id number]/Jaiyanna Safran,RN,BSN,CCM

## 2011-07-24 NOTE — Progress Notes (Signed)
Subjective: Pt feels fine CP has resolved.  She feels back to baseline.  Objective: Vital signs in last 24 hours: Temp:  [97.4 F (36.3 C)-97.8 F (36.6 C)] 97.5 F (36.4 C) (01/03 1337) Pulse Rate:  [67-78] 74  (01/03 1337) Resp:  [18] 18  (01/03 1337) BP: (119-161)/(67-101) 119/81 mmHg (01/03 1337) SpO2:  [94 %-99 %] 94 % (01/03 1337) Weight:  [89.6 kg (197 lb 8.5 oz)] 197 lb 8.5 oz (89.6 kg) (01/03 0315) Weight change:   -Intake/Output from previous day: 01/02 0701 - 01/03 0700 In: 1160 [I.V.:1160] Out: 600 [Urine:400; Emesis/NG output:200]  Blood pressure 119/81, pulse 74, temperature 97.5 F (36.4 C), temperature source Oral, resp. rate 18, height 5\' 5"  (1.651 m), weight 89.6 kg (197 lb 8.5 oz), SpO2 94.00%. Gen:  Pt appears older than her stated age CV:RRR LUNGS:CTAB ZOX:WRUEAVWU BS Ext: no edema  Lab Results: Lab Results  Component Value Date   WBC 4.8 07/24/2011   HGB 13.7 07/24/2011   HCT 39.5 07/24/2011   MCV 90.0 07/24/2011   PLT 262 07/24/2011    BMET  Lab 07/24/11 0628  NA 139  K 3.9  CL 104  CO2 28  BUN 10  CREATININE 0.63  LABGLOM --  GLUCOSE 97  CALCIUM 9.6    Studies/Results: Dg Chest 2 View  07/23/2011  *RADIOLOGY REPORT*  Clinical Data: Syncope with fall today.  CHEST - 2 VIEW  Comparison: 05/07/2010 and 12/21/2009.  CT 02/16/2009.  Findings: The heart size and mediastinal contours are stable. There is mild chronic interstitial prominence.  No airspace disease or edema is identified.  Density at the left costophrenic angle appears slightly greater.  In correlation with prior studies, this is a chronic finding related to pleural thickening. The current difference is likely projectional.  No significant pleural effusion is identified.  There are postsurgical changes status post lower cervical fusion and possible distal clavicle resection bilaterally.  IMPRESSION: Chronic pleural thickening at the left costophrenic angle appears slightly greater, although  this may be projectional.  No acute cardiopulmonary process demonstrated.  Original Report Authenticated By: Gerrianne Scale, M.D.   Ct Head Wo Contrast  07/23/2011  *RADIOLOGY REPORT*  Clinical Data:  Syncope.  Resulting fall.  CT HEAD WITHOUT CONTRAST CT CERVICAL SPINE WITHOUT CONTRAST  Technique:  Multidetector CT imaging of the head and cervical spine was performed following the standard protocol without intravenous contrast.  Multiplanar CT image reconstructions of the cervical spine were also generated.  Comparison:  Head CT 09/16/2007.  CT temporal bones 06/20/2009. Cervical spine MRI 03/02/2011.  CT HEAD  Findings: There is no evidence of acute intracranial hemorrhage, mass lesion, brain edema or extra-axial fluid collection.  The ventricles and subarachnoid spaces are appropriately sized for age.  There is a chronic probable mucous retention cyst in the right maxillary sinus.  The additional paranasal sinuses are well- aerated.  There are postsurgical changes status post left mastoidectomy.  The mastoid air cells on the right are partially opacified, similar to the prior examination.  No coalescence is identified.  There is no evidence of acute fracture.  IMPRESSION:  1.  No acute intracranial or calvarial findings. 2.  Chronic right mastoid opacification with interval postsurgical changes on the left.  CT CERVICAL SPINE  Findings: The cervical alignment is normal.  The patient is status post fusion from C3-C7.  There is an anterior plate and screws extending from C3-C5.  There is a separate plate and screws at C6- C7.  Hardware appears well positioned.  There is no evidence of hardware loosening.  Solid interbody fusion is present at each level.  There is no evidence of acute fracture or traumatic subluxation.  No acute soft tissue findings are identified.  Mastoid findings are described above.  IMPRESSION:  1.  No evidence of acute cervical spine fracture, traumatic subluxation or static signs  instability. 2.  Status post cervical fusion from C3-C7.  No hardware displacement or malalignment identified.  Original Report Authenticated By: Gerrianne Scale, M.D.   Ct Cervical Spine Wo Contrast  07/23/2011  *RADIOLOGY REPORT*  Clinical Data:  Syncope.  Resulting fall.  CT HEAD WITHOUT CONTRAST CT CERVICAL SPINE WITHOUT CONTRAST  Technique:  Multidetector CT imaging of the head and cervical spine was performed following the standard protocol without intravenous contrast.  Multiplanar CT image reconstructions of the cervical spine were also generated.  Comparison:  Head CT 09/16/2007.  CT temporal bones 06/20/2009. Cervical spine MRI 03/02/2011.  CT HEAD  Findings: There is no evidence of acute intracranial hemorrhage, mass lesion, brain edema or extra-axial fluid collection.  The ventricles and subarachnoid spaces are appropriately sized for age.  There is a chronic probable mucous retention cyst in the right maxillary sinus.  The additional paranasal sinuses are well- aerated.  There are postsurgical changes status post left mastoidectomy.  The mastoid air cells on the right are partially opacified, similar to the prior examination.  No coalescence is identified.  There is no evidence of acute fracture.  IMPRESSION:  1.  No acute intracranial or calvarial findings. 2.  Chronic right mastoid opacification with interval postsurgical changes on the left.  CT CERVICAL SPINE  Findings: The cervical alignment is normal.  The patient is status post fusion from C3-C7.  There is an anterior plate and screws extending from C3-C5.  There is a separate plate and screws at C6- C7.  Hardware appears well positioned.  There is no evidence of hardware loosening.  Solid interbody fusion is present at each level.  There is no evidence of acute fracture or traumatic subluxation.  No acute soft tissue findings are identified.  Mastoid findings are described above.  IMPRESSION:  1.  No evidence of acute cervical spine  fracture, traumatic subluxation or static signs instability. 2.  Status post cervical fusion from C3-C7.  No hardware displacement or malalignment identified.  Original Report Authenticated By: Gerrianne Scale, M.D.    Medications:  Current Facility-Administered Medications  Medication Dose Route Frequency Provider Last Rate Last Dose  . 0.9 %  sodium chloride infusion   Intravenous Continuous Maynor Mwangi Jarrett-Davis 20 mL/hr at 07/24/11 1539 20 mL/hr at 07/24/11 1539  . albuterol (PROVENTIL HFA;VENTOLIN HFA) 108 (90 BASE) MCG/ACT inhaler 2 puff  2 puff Inhalation Q6H PRN Clanford L Johnson, MD      . ipratropium (ATROVENT) nebulizer solution 0.5 mg  0.5 mg Nebulization Q6H PRN Clanford L Johnson, MD       And  . albuterol (PROVENTIL) (5 MG/ML) 0.5% nebulizer solution 2.5 mg  2.5 mg Nebulization Q6H PRN Clanford L Johnson, MD      . aspirin EC tablet 81 mg  81 mg Oral Daily Clanford Cyndie Mull, MD   81 mg at 07/24/11 1158  . cyclobenzaprine (FLEXERIL) tablet 10 mg  10 mg Oral TID PRN Clanford Cyndie Mull, MD   10 mg at 07/24/11 1733  . enoxaparin (LOVENOX) injection 40 mg  40 mg Subcutaneous QHS Clanford Cyndie Mull, MD      .  fenofibrate tablet 54 mg  54 mg Oral Daily Clanford Cyndie Mull, MD   54 mg at 07/24/11 1151  . fentaNYL (SUBLIMAZE) injection 100 mcg  100 mcg Intravenous Once [employer]. Rubin Payor, MD   100 mcg at 07/23/11 1919  . Fluticasone-Salmeterol (ADVAIR) 250-50 MCG/DOSE inhaler 1 puff  1 puff Inhalation Q12H Clanford Cyndie Mull, MD   1 puff at 07/24/11 1003  . hydrochlorothiazide (HYDRODIURIL) tablet 25 mg  25 mg Oral Daily Lorenza Evangelist, PHARMD   25 mg at 07/24/11 1151  . ibuprofen (ADVIL,MOTRIN) tablet 600 mg  600 mg Oral Q6H PRN Clanford L Johnson, MD      . insulin aspart (novoLOG) injection 0-20 Units  0-20 Units Subcutaneous TID WC Clanford Cyndie Mull, MD   4 Units at 07/24/11 1726  . lisinopril (PRINIVIL,ZESTRIL) tablet 20 mg  20 mg Oral Daily Lorenza Evangelist, PHARMD   20 mg at  07/24/11 1151  . LORazepam (ATIVAN) injection 1 mg  1 mg Intravenous Q4H PRN Clanford Cyndie Mull, MD   1 mg at 07/24/11 0610  . metoprolol (TOPROL-XL) 24 hr tablet 50 mg  50 mg Oral Daily Clanford Cyndie Mull, MD   50 mg at 07/24/11 1151  . morphine 2 MG/ML injection 5 mg  5 mg Intravenous Q4H PRN Clanford Cyndie Mull, MD   5 mg at 07/24/11 1546  . nitroGLYCERIN (NITROSTAT) SL tablet 0.4 mg  0.4 mg Sublingual Q5 Min x 3 PRN Clanford L Johnson, MD      . ondansetron (ZOFRAN) tablet 4 mg  4 mg Oral Q6H PRN Clanford Cyndie Mull, MD       Or  . ondansetron (ZOFRAN) injection 4 mg  4 mg Intravenous Q6H PRN Clanford L Johnson, MD      . pantoprazole (PROTONIX) EC tablet 80 mg  80 mg Oral Q1200 Clanford Cyndie Mull, MD   80 mg at 07/24/11 1151  . pneumococcal 23 valent vaccine (PNU-IMMUNE) injection 0.5 mL  0.5 mL Intramuscular Tomorrow-1000 Khyran Riera Jarrett-Davis      . prednisoLONE acetate (PRED FORTE) 1 % ophthalmic suspension 1 drop  1 drop Both Eyes Daily Clanford Cyndie Mull, MD   1 drop at 07/24/11 1150  . promethazine (PHENERGAN) injection 12.5 mg  12.5 mg Intravenous To ER Juliet Rude. Pickering, MD   12.5 mg at 07/23/11 1917  . sorbitol 70 % solution 30 mL  30 mL Oral Daily PRN Clanford Cyndie Mull, MD      . zolpidem (AMBIEN) tablet 10 mg  10 mg Oral QHS PRN Clanford Cyndie Mull, MD      . DISCONTD: lisinopril-hydrochlorothiazide (PRINZIDE,ZESTORETIC) 20-25 MG per tablet 1 tablet  1 tablet Oral Daily Clanford Cyndie Mull, MD        Assessment/Plan: Principal Problem:  *Syncopal episodes Active Problems:  Chronic neck pain  Fall  Epigastric abdominal pain  Type II or unspecified type diabetes mellitus without mention of complication, not stated as uncontrolled  Unspecified essential hypertension  Hyperlipidemia LDL goal < 100  COPD (chronic obstructive pulmonary disease)  Chronic headache  Hypokalemia  OSA (obstructive sleep apnea)  Insomnia  Cerebrovascular disease  Sarcoidosis  No further syncopal  episode since admit.  She feels back to her baseline.  Carotid dopplers negative.  2Decho pending.  Discussed her neck brace with Ortho tech.  They will see patient and also reorder her another custom neck brace.  Cardiac markers negative.  Blood sugars are controlled.   LOS: 1 day   Earlene Plater  MD, [facility]  [phone] 07/24/2011, 5:52 PM

## 2011-07-24 NOTE — Progress Notes (Signed)
Physical Therapy Evaluation Patient Details Name: Kristi Kramer MRN: [id number] DOB: Oct 23, 1945 Today's Date: 07/24/2011 Time: 1478-2956  Eval II Problem List:  Patient Active Problem List  Diagnoses  . Syncopal episodes  . Chronic neck pain  . Fall  . Epigastric abdominal pain  . Type II or unspecified type diabetes mellitus without mention of complication, not stated as uncontrolled  . Unspecified essential hypertension  . Hyperlipidemia LDL goal < 100  . COPD (chronic obstructive pulmonary disease)  . Chronic headache  . Hypokalemia  . OSA (obstructive sleep apnea)  . Insomnia  . Cerebrovascular disease  . Sarcoidosis    Past Medical History:  Past Medical History  Diagnosis Date  . Diabetes mellitus   . Hypertension   . High cholesterol   . COPD (chronic obstructive pulmonary disease)    Past Surgical History:  Past Surgical History  Procedure Date  . Neck surgery   . Back surgery     PT Assessment/Plan/Recommendation PT Assessment Clinical Impression Statement: Pt presents with diagnosis of syncope. Pt will benefit from skilled PT in the acute care setting to maximize independence and safety with basic functional mobility in preperation for discharge home.  PT Recommendation/Assessment: Patient will need skilled PT in the acute care venue PT Problem List: Decreased mobility Barriers to Discharge: Decreased caregiver support PT Therapy Diagnosis : Difficulty walking PT Plan PT Frequency: Min 3X/week PT Treatment/Interventions: Gait training;Functional mobility training;Therapeutic activities;Therapeutic exercise;Patient/family education PT Recommendation Recommendations for Other Services: OT consult-already on board Follow Up Recommendations: home safety evaluation Equipment Recommended: None recommended by PT PT Goals  Acute Rehab PT Goals PT Goal Formulation: With patient Pt will go Sit to Stand: with modified independence PT Goal: Sit to Stand -  Progress: Not met Pt will Ambulate: 51 - 150 feet;with modified independence;with least restrictive assistive device PT Goal: Ambulate - Progress: Not met Pt will Perform Home Exercise Program: with supervision, verbal cues required/provided PT Goal: Perform Home Exercise Program - Progress: Not met  PT Evaluation Precautions/Restrictions  Precautions Precaution Comments: uses neck brace prn:  had sx approximately 2 months ago Cervical Brace: Applied in standing position;Soft collar Restrictions Weight Bearing Restrictions: No Prior Functioning  Home Living Lives With: Other (Comment) (boyfriend, but feels he will be gone when she returns) Receives Help From:  (neighbor and she help each other; has children--busy) Type of Home: House Home Layout: One level Home Access: Other (comment) (threshold) Bathroom Shower/Tub: Health visitor: Standard Home Adaptive Equipment: Straight cane;Other (comment) (has put resin chair in shower before) Prior Function Level of Independence: Independent with basic ADLs;Independent with homemaking with ambulation;Requires assistive device for independence (did what she could; frozen dinners, etc.  Cane in community) Driving: No Cognition Cognition Arousal/Alertness: Awake/alert Overall Cognitive Status: Appears within functional limits for tasks assessed Cognition - Other Comments: Moves quickly with decreased awareness for lines Sensation/Coordination Sensation Light Touch: Appears Intact Coordination Gross Motor Movements are Fluid and Coordinated: Yes Extremity Assessment  RLE Assessment RLE Assessment: Within Functional Limits LLE Assessment LLE Assessment: Within Functional Limits Mobility (including Balance) Bed Mobility Bed Mobility: Yes Supine to Sit: 7: Independent Transfers Transfers: Yes Sit to Stand: 5: Supervision Sit to Stand Details (indicate cue type and reason): Supervison for lines Stand to Sit: 5:  Supervision Stand to Sit Details: Supervision for lines Ambulation/Gait Ambulation/Gait: Yes Ambulation/Gait Assistance Details (indicate cue type and reason): Min-guard assist. VCs safety. Pt intermittently reaching out for objects to hold onto. Pt normally uses cane when  ambulating outside of house. Slightly unsteady Ambulation Distance (Feet): 125 Feet Assistive device: None Gait Pattern: Within Functional Limits  Posture/Postural Control Posture/Postural Control: No significant limitations Exercise    End of Session PT - End of Session Equipment Utilized During Treatment: Gait belt Activity Tolerance: Patient tolerated treatment well Patient left: in chair;with call bell in reach General Behavior During Session: [facility] for tasks performed Cognition: [facility] for tasks performed  Rebeca Alert [facility] 07/24/2011, 10:17 AM [phone]

## 2011-07-25 DIAGNOSIS — R079 Chest pain, unspecified: Secondary | ICD-10-CM | POA: Diagnosis not present

## 2011-07-25 DIAGNOSIS — R55 Syncope and collapse: Secondary | ICD-10-CM | POA: Diagnosis not present

## 2011-07-25 DIAGNOSIS — E119 Type 2 diabetes mellitus without complications: Secondary | ICD-10-CM | POA: Diagnosis not present

## 2011-07-25 DIAGNOSIS — M542 Cervicalgia: Secondary | ICD-10-CM | POA: Diagnosis not present

## 2011-07-25 LAB — GLUCOSE, CAPILLARY
Glucose-Capillary: 99 mg/dL (ref 70–99)
Glucose-Capillary: 110 mg/dL — ABNORMAL HIGH (ref 70–99)

## 2011-07-25 MED ORDER — ASPIRIN 81 MG PO TBEC: 81.0000 mg | DELAYED_RELEASE_TABLET | Freq: Every day | ORAL | Status: DC

## 2011-07-25 NOTE — Discharge Summary (Signed)
Physician Discharge Summary  Patient ID: Kristi Kramer MRN: [id number] DOB/AGE: 64-Oct-1947 64 y.o.  Admit date: 07/23/2011 Discharge date: 07/25/2011  Discharge Diagnoses:  Syncopal episodes Chronic neck pain  Fall  Epigastric abdominal pain  Type II or unspecified type diabetes mellitus without mention of complication, not stated as uncontrolled  Essential hypertension  Hyperlipidemia LDL goal < 100  COPD (chronic obstructive pulmonary disease)  Chronic headache  Hypokalemia  OSA (obstructive sleep apnea)  Insomnia  Cerebrovascular disease  Sarcoidosis   Current Discharge Medication List    START taking these medications   Details  aspirin EC 81 MG EC tablet Take 1 tablet (81 mg total) by mouth daily. Qty: 30 tablet, Refills: 0      CONTINUE these medications which have NOT CHANGED   Details  albuterol (PROVENTIL HFA;VENTOLIN HFA) 108 (90 BASE) MCG/ACT inhaler Inhale 2 puffs into the lungs every 6 (six) hours as needed. For shortness of breath.     ALPRAZolam (XANAX) 0.5 MG tablet Take 0.5-1 mg by mouth daily as needed. For anxiety.     cyclobenzaprine (FLEXERIL) 10 MG tablet Take 10 mg by mouth 3 (three) times daily as needed. For muscle spasms.     esomeprazole (NEXIUM) 40 MG capsule Take 40 mg by mouth 2 (two) times daily.      fenofibrate (TRICOR) 145 MG tablet Take 145 mg by mouth daily.      Fluticasone-Salmeterol (ADVAIR) 250-50 MCG/DOSE AEPB Inhale 1 puff into the lungs every 12 (twelve) hours.      ibuprofen (ADVIL,MOTRIN) 200 MG tablet Take 400 mg by mouth every 6 (six) hours as needed. For pain.     lisinopril-hydrochlorothiazide (PRINZIDE,ZESTORETIC) 20-25 MG per tablet Take 1 tablet by mouth daily.      metFORMIN (GLUCOPHAGE-XR) 500 MG 24 hr tablet Take 1,000 mg by mouth daily.      metoprolol (TOPROL-XL) 50 MG 24 hr tablet Take 50 mg by mouth daily.      oxyCODONE-acetaminophen (PERCOCET) 10-325 MG per tablet Take 0.5-1 tablets by mouth every 6 (six)  hours as needed. For pain.     prednisoLONE acetate (PRED FORTE) 1 % ophthalmic suspension Place 1 drop into both eyes daily.         Discharge Orders    Future Orders Please Complete By Expires   Increase activity slowly         Follow-up Information    Follow up with CONROY,NATHAN, PA in 1 week.         Disposition: Home or Self Care  Discharged Condition: Stable  Full Code   Consults:   none  HPI:  Kristi Kramer is an 66 y.o. female with hypertension diabetes mellitus, sleep apnea and cerebrovascular disease who presented to the emergency department after having a syncopal episode today. Patient reports that she was arguing with her boyfriend today. She became very upset. She says that she began to experience numbness in her lips pain in the center of the chest and numbness in both arms. The patient reports that she was able to walk to a neighbors house where she passed out. The patient says she was only out for short amount of time. Then she was awakened and came to the emergency department for further treatment. The patient describes the chest pain as an aching pain in the center of the chest under the sternum and associated with a feeling of pressure. The patient says that she also has chronic neck pain and recently had a  C-spine fusion. Patient has sleep apnea and difficulty sleeping at night. She was treated in the emergency department and is starting to experience some improvement in symptoms. Her symptoms of chest pressure have not completely resolved. Hospital admission was requested for evaluation of syncopal episodes, chest pain and rule out myocardial infarction.   PMH:  As per H & P FH: As per  H & P SH: As per H & P Med: as per H & P Allergies and ROS: as per H&P   Discharge Exam: Blood pressure 131/78, pulse 63, temperature 97.5 F (36.4 C), temperature source Oral, resp. rate 18, height 5\' 5"  (1.651 m), weight 89.6 kg (197 lb 8.5 oz), SpO2 97.00%.  Gen: Pt  appears older than her stated age  CV:RRR  LUNGS:CTAB  BJY:NWGNFAOZ BS  Ext: no edema Neuro exam: Nonfocal.   Hospital Course: Syncopal episodes The patient presented with a syncopal episode after she had a  argument with her boyfriend. Her 2-D echo CT of the head cardiac markers carotid Dopplers are all negative. She states that she feels back to baseline at this time. Patient will be discharged home and she should follow up with her primary care physician.  Chronic neck pain Continue her home when necessary medication.   Epigastric abdominal pain Continue her on PPI.   Type II or unspecified type diabetes mellitus without mention of complication, not stated as uncontrolled Continue her on metformin.   Essential hypertension Continue her home medications.  Blood pressure at the time of discharge 131/78.   Hyperlipidemia LDL goal < 100 Monitor   COPD (chronic obstructive pulmonary disease) Continue Advair   Chronic headache   Hypokalemia   OSA (obstructive sleep apnea) Continue CPAP   Insomnia Followup outpatient.   Cerebrovascular disease  Aspirin Sarcoidosis Stable Labs:   Results for orders placed during the hospital encounter of 07/23/11 (from the past 48 hour(s))  URINALYSIS, ROUTINE W REFLEX MICROSCOPIC     Status: Normal   Collection Time   07/23/11  5:03 PM      Component Value Range Comment   Color, Urine YELLOW  YELLOW     APPearance CLEAR  CLEAR     Specific Gravity, Urine 1.008  1.005 - 1.030     pH 7.5  5.0 - 8.0     Glucose, UA NEGATIVE  NEGATIVE (mg/dL)    Hgb urine dipstick NEGATIVE  NEGATIVE     Bilirubin Urine NEGATIVE  NEGATIVE     Ketones, ur NEGATIVE  NEGATIVE (mg/dL)    Protein, ur NEGATIVE  NEGATIVE (mg/dL)    Urobilinogen, UA 0.2  0.0 - 1.0 (mg/dL)    Nitrite NEGATIVE  NEGATIVE     Leukocytes, UA NEGATIVE  NEGATIVE  MICROSCOPIC NOT DONE ON URINES WITH NEGATIVE PROTEIN, BLOOD, LEUKOCYTES, NITRITE, OR GLUCOSE <1000 mg/dL.  CBC      Status: Normal   Collection Time   07/23/11  5:05 PM      Component Value Range Comment   WBC 6.4  4.0 - 10.5 (K/uL)    RBC 4.44  3.87 - 5.11 (MIL/uL)    Hemoglobin 13.2  12.0 - 15.0 (g/dL)    HCT 30.8  65.7 - 84.6 (%)    MCV 89.2  78.0 - 100.0 (fL)    MCH 29.7  26.0 - 34.0 (pg)    MCHC 33.3  30.0 - 36.0 (g/dL)    RDW 96.2  95.2 - 84.1 (%)    Platelets 255  150 - 400 (  K/uL)   DIFFERENTIAL     Status: Normal   Collection Time   07/23/11  5:05 PM      Component Value Range Comment   Neutrophils Relative 69  43 - 77 (%)    Neutro Abs 4.4  1.7 - 7.7 (K/uL)    Lymphocytes Relative 22  12 - 46 (%)    Lymphs Abs 1.4  0.7 - 4.0 (K/uL)    Monocytes Relative 6  3 - 12 (%)    Monocytes Absolute 0.4  0.1 - 1.0 (K/uL)    Eosinophils Relative 3  0 - 5 (%)    Eosinophils Absolute 0.2  0.0 - 0.7 (K/uL)    Basophils Relative 0  0 - 1 (%)    Basophils Absolute 0.0  0.0 - 0.1 (K/uL)   TROPONIN I     Status: Normal   Collection Time   07/23/11  5:05 PM      Component Value Range Comment   Troponin I <0.30  <0.30 (ng/mL)   COMPREHENSIVE METABOLIC PANEL     Status: Abnormal   Collection Time   07/23/11  5:05 PM      Component Value Range Comment   Sodium 140  135 - 145 (mEq/L)    Potassium 3.3 (*) 3.5 - 5.1 (mEq/L)    Chloride 102  96 - 112 (mEq/L)    CO2 28  19 - 32 (mEq/L)    Glucose, Bld 99  70 - 99 (mg/dL)    BUN 12  6 - 23 (mg/dL)    Creatinine, Ser 3.87  0.50 - 1.10 (mg/dL)    Calcium 9.9  8.4 - 10.5 (mg/dL)    Total Protein 7.1  6.0 - 8.3 (g/dL)    Albumin 3.9  3.5 - 5.2 (g/dL)    AST 28  0 - 37 (U/L)    ALT 26  0 - 35 (U/L)    Alkaline Phosphatase 77  39 - 117 (U/L)    Total Bilirubin 0.2 (*) 0.3 - 1.2 (mg/dL)    GFR calc non Af Amer >90  >90 (mL/min)    GFR calc Af Amer >90  >90 (mL/min)   LIPASE, BLOOD     Status: Normal   Collection Time   07/23/11  5:05 PM      Component Value Range Comment   Lipase 13  11 - 59 (U/L)   CBC     Status: Normal   Collection Time   07/23/11 10:55 PM        Component Value Range Comment   WBC 6.1  4.0 - 10.5 (K/uL)    RBC 4.47  3.87 - 5.11 (MIL/uL)    Hemoglobin 13.8  12.0 - 15.0 (g/dL)    HCT 56.4  33.2 - 95.1 (%)    MCV 88.8  78.0 - 100.0 (fL)    MCH 30.9  26.0 - 34.0 (pg)    MCHC 34.8  30.0 - 36.0 (g/dL)    RDW 88.4  16.6 - 06.3 (%)    Platelets 255  150 - 400 (K/uL)   CREATININE, SERUM     Status: Normal   Collection Time   07/23/11 10:55 PM      Component Value Range Comment   Creatinine, Ser 0.56  0.50 - 1.10 (mg/dL)    GFR calc non Af Amer >90  >90 (mL/min)    GFR calc Af Amer >90  >90 (mL/min)   HEMOGLOBIN A1C     Status: Abnormal  Collection Time   07/23/11 10:55 PM      Component Value Range Comment   Hemoglobin A1C 6.1 (*) <5.7 (%)    Mean Plasma Glucose 128 (*) <117 (mg/dL)   CARDIAC PANEL(CRET KIN+CKTOT+MB+TROPI)     Status: Normal   Collection Time   07/23/11 10:55 PM      Component Value Range Comment   Total CK 73  7 - 177 (U/L)    CK, MB 1.8  0.3 - 4.0 (ng/mL)    Troponin I <0.30  <0.30 (ng/mL)    Relative Index RELATIVE INDEX IS INVALID  0.0 - 2.5    CARDIAC PANEL(CRET KIN+CKTOT+MB+TROPI)     Status: Normal   Collection Time   07/24/11  6:27 AM      Component Value Range Comment   Total CK 57  7 - 177 (U/L)    CK, MB 1.7  0.3 - 4.0 (ng/mL)    Troponin I <0.30  <0.30 (ng/mL)    Relative Index RELATIVE INDEX IS INVALID  0.0 - 2.5    COMPREHENSIVE METABOLIC PANEL     Status: Normal   Collection Time   07/24/11  6:28 AM      Component Value Range Comment   Sodium 139  135 - 145 (mEq/L)    Potassium 3.9  3.5 - 5.1 (mEq/L)    Chloride 104  96 - 112 (mEq/L)    CO2 28  19 - 32 (mEq/L)    Glucose, Bld 97  70 - 99 (mg/dL)    BUN 10  6 - 23 (mg/dL)    Creatinine, Ser 1.61  0.50 - 1.10 (mg/dL)    Calcium 9.6  8.4 - 10.5 (mg/dL)    Total Protein 6.7  6.0 - 8.3 (g/dL)    Albumin 3.5  3.5 - 5.2 (g/dL)    AST 23  0 - 37 (U/L)    ALT 25  0 - 35 (U/L)    Alkaline Phosphatase 73  39 - 117 (U/L)    Total Bilirubin 0.4   0.3 - 1.2 (mg/dL)    GFR calc non Af Amer >90  >90 (mL/min)    GFR calc Af Amer >90  >90 (mL/min)   CBC     Status: Normal   Collection Time   07/24/11  6:28 AM      Component Value Range Comment   WBC 4.8  4.0 - 10.5 (K/uL)    RBC 4.39  3.87 - 5.11 (MIL/uL)    Hemoglobin 13.7  12.0 - 15.0 (g/dL)    HCT 09.6  04.5 - 40.9 (%)    MCV 90.0  78.0 - 100.0 (fL)    MCH 31.2  26.0 - 34.0 (pg)    MCHC 34.7  30.0 - 36.0 (g/dL)    RDW 81.1  91.4 - 78.2 (%)    Platelets 262  150 - 400 (K/uL)   TSH     Status: Normal   Collection Time   07/24/11  6:28 AM      Component Value Range Comment   TSH 2.661  0.350 - 4.500 (uIU/mL)   LIPID PANEL     Status: Abnormal   Collection Time   07/24/11  6:28 AM      Component Value Range Comment   Cholesterol 176  0 - 200 (mg/dL)    Triglycerides 956 (*) <150 (mg/dL)    HDL 38 (*) >21 (mg/dL)    Total CHOL/HDL Ratio 4.6      VLDL  44 (*) 0 - 40 (mg/dL)    LDL Cholesterol 94  0 - 99 (mg/dL)   GLUCOSE, CAPILLARY     Status: Abnormal   Collection Time   07/24/11  7:45 AM      Component Value Range Comment   Glucose-Capillary 103 (*) 70 - 99 (mg/dL)   GLUCOSE, CAPILLARY     Status: Normal   Collection Time   07/24/11 11:55 AM      Component Value Range Comment   Glucose-Capillary 81  70 - 99 (mg/dL)   CARDIAC PANEL(CRET KIN+CKTOT+MB+TROPI)     Status: Normal   Collection Time   07/24/11  2:55 PM      Component Value Range Comment   Total CK 56  7 - 177 (U/L)    CK, MB 1.7  0.3 - 4.0 (ng/mL)    Troponin I <0.30  <0.30 (ng/mL)    Relative Index RELATIVE INDEX IS INVALID  0.0 - 2.5    GLUCOSE, CAPILLARY     Status: Abnormal   Collection Time   07/24/11  5:13 PM      Component Value Range Comment   Glucose-Capillary 158 (*) 70 - 99 (mg/dL)    Comment 1 Documented in Chart      Comment 2 Notify RN     GLUCOSE, CAPILLARY     Status: Abnormal   Collection Time   07/24/11  9:36 PM      Component Value Range Comment   Glucose-Capillary 110 (*) 70 - 99 (mg/dL)     Comment 1 Documented in Chart      Comment 2 Notify RN     GLUCOSE, CAPILLARY     Status: Normal   Collection Time   07/25/11  7:45 AM      Component Value Range Comment   Glucose-Capillary 99  70 - 99 (mg/dL)   GLUCOSE, CAPILLARY     Status: Abnormal   Collection Time   07/25/11 11:42 AM      Component Value Range Comment   Glucose-Capillary 110 (*) 70 - 99 (mg/dL)     Diagnostics:  Dg Chest 2 View  07/23/2011  *RADIOLOGY REPORT*  Clinical Data: Syncope with fall today.  CHEST - 2 VIEW  Comparison: 05/07/2010 and 12/21/2009.  CT 02/16/2009.  Findings: The heart size and mediastinal contours are stable. There is mild chronic interstitial prominence.  No airspace disease or edema is identified.  Density at the left costophrenic angle appears slightly greater.  In correlation with prior studies, this is a chronic finding related to pleural thickening. The current difference is likely projectional.  No significant pleural effusion is identified.  There are postsurgical changes status post lower cervical fusion and possible distal clavicle resection bilaterally.  IMPRESSION: Chronic pleural thickening at the left costophrenic angle appears slightly greater, although this may be projectional.  No acute cardiopulmonary process demonstrated.  Original Report Authenticated By: Gerrianne Scale, M.D.   Ct Head Wo Contrast  07/23/2011  *RADIOLOGY REPORT*  Clinical Data:  Syncope.  Resulting fall.  CT HEAD WITHOUT CONTRAST CT CERVICAL SPINE WITHOUT CONTRAST  Technique:  Multidetector CT imaging of the head and cervical spine was performed following the standard protocol without intravenous contrast.  Multiplanar CT image reconstructions of the cervical spine were also generated.  Comparison:  Head CT 09/16/2007.  CT temporal bones 06/20/2009. Cervical spine MRI 03/02/2011.  CT HEAD  Findings: There is no evidence of acute intracranial hemorrhage, mass lesion, brain edema or extra-axial fluid collection.  The  ventricles and subarachnoid spaces are appropriately sized for age.  There is a chronic probable mucous retention cyst in the right maxillary sinus.  The additional paranasal sinuses are well- aerated.  There are postsurgical changes status post left mastoidectomy.  The mastoid air cells on the right are partially opacified, similar to the prior examination.  No coalescence is identified.  There is no evidence of acute fracture.  IMPRESSION:  1.  No acute intracranial or calvarial findings. 2.  Chronic right mastoid opacification with interval postsurgical changes on the left.  CT CERVICAL SPINE  Findings: The cervical alignment is normal.  The patient is status post fusion from C3-C7.  There is an anterior plate and screws extending from C3-C5.  There is a separate plate and screws at C6- C7.  Hardware appears well positioned.  There is no evidence of hardware loosening.  Solid interbody fusion is present at each level.  There is no evidence of acute fracture or traumatic subluxation.  No acute soft tissue findings are identified.  Mastoid findings are described above.  IMPRESSION:  1.  No evidence of acute cervical spine fracture, traumatic subluxation or static signs instability. 2.  Status post cervical fusion from C3-C7.  No hardware displacement or malalignment identified.  Original Report Authenticated By: Gerrianne Scale, M.D.   Ct Cervical Spine Wo Contrast  07/23/2011  *RADIOLOGY REPORT*  Clinical Data:  Syncope.  Resulting fall.  CT HEAD WITHOUT CONTRAST CT CERVICAL SPINE WITHOUT CONTRAST  Technique:  Multidetector CT imaging of the head and cervical spine was performed following the standard protocol without intravenous contrast.  Multiplanar CT image reconstructions of the cervical spine were also generated.  Comparison:  Head CT 09/16/2007.  CT temporal bones 06/20/2009. Cervical spine MRI 03/02/2011.  CT HEAD  Findings: There is no evidence of acute intracranial hemorrhage, mass lesion, brain  edema or extra-axial fluid collection.  The ventricles and subarachnoid spaces are appropriately sized for age.  There is a chronic probable mucous retention cyst in the right maxillary sinus.  The additional paranasal sinuses are well- aerated.  There are postsurgical changes status post left mastoidectomy.  The mastoid air cells on the right are partially opacified, similar to the prior examination.  No coalescence is identified.  There is no evidence of acute fracture.  IMPRESSION:  1.  No acute intracranial or calvarial findings. 2.  Chronic right mastoid opacification with interval postsurgical changes on the left.  CT CERVICAL SPINE  Findings: The cervical alignment is normal.  The patient is status post fusion from C3-C7.  There is an anterior plate and screws extending from C3-C5.  There is a separate plate and screws at C6- C7.  Hardware appears well positioned.  There is no evidence of hardware loosening.  Solid interbody fusion is present at each level.  There is no evidence of acute fracture or traumatic subluxation.  No acute soft tissue findings are identified.  Mastoid findings are described above.  IMPRESSION:  1.  No evidence of acute cervical spine fracture, traumatic subluxation or static signs instability. 2.  Status post cervical fusion from C3-C7.  No hardware displacement or malalignment identified.  Original Report Authenticated By: Gerrianne Scale, M.D.   Time spent with patient in doing this discharge is approximately 45 minutes. SignedEarlene Plater MD, [place] Pier 07/25/2011, 3:58 PM

## 2011-07-25 NOTE — Progress Notes (Signed)
Physical Therapy  Followed up with pt. Both pt and nurse tech report pt ambulating in hallway without assistance. Pt denies further need for PT services. PT will sign off at this time. Continue to recommend home safety evaluation after discharge. Thanks.

## 2011-07-25 NOTE — Progress Notes (Signed)
RT found pt already sleeping with home CPAP on.  Pt tolerating well.

## 2011-08-15 DIAGNOSIS — J45909 Unspecified asthma, uncomplicated: Secondary | ICD-10-CM | POA: Diagnosis not present

## 2011-08-15 DIAGNOSIS — D869 Sarcoidosis, unspecified: Secondary | ICD-10-CM | POA: Diagnosis not present

## 2011-08-15 DIAGNOSIS — F17200 Nicotine dependence, unspecified, uncomplicated: Secondary | ICD-10-CM | POA: Diagnosis not present

## 2011-08-15 DIAGNOSIS — G4710 Hypersomnia, unspecified: Secondary | ICD-10-CM | POA: Diagnosis not present

## 2011-08-15 DIAGNOSIS — G4730 Sleep apnea, unspecified: Secondary | ICD-10-CM | POA: Diagnosis not present

## 2011-08-18 DIAGNOSIS — G4730 Sleep apnea, unspecified: Secondary | ICD-10-CM | POA: Diagnosis not present

## 2011-08-18 DIAGNOSIS — D869 Sarcoidosis, unspecified: Secondary | ICD-10-CM | POA: Diagnosis not present

## 2011-08-18 DIAGNOSIS — F17200 Nicotine dependence, unspecified, uncomplicated: Secondary | ICD-10-CM | POA: Diagnosis not present

## 2011-08-18 DIAGNOSIS — J45909 Unspecified asthma, uncomplicated: Secondary | ICD-10-CM | POA: Diagnosis not present

## 2011-08-18 DIAGNOSIS — G4710 Hypersomnia, unspecified: Secondary | ICD-10-CM | POA: Diagnosis not present

## 2011-08-27 DIAGNOSIS — G4730 Sleep apnea, unspecified: Secondary | ICD-10-CM | POA: Diagnosis not present

## 2011-08-27 DIAGNOSIS — G4710 Hypersomnia, unspecified: Secondary | ICD-10-CM | POA: Diagnosis not present

## 2011-09-01 ENCOUNTER — Emergency Department (HOSPITAL_COMMUNITY): Payer: Medicare Other

## 2011-09-01 ENCOUNTER — Encounter (HOSPITAL_COMMUNITY): Payer: Self-pay | Admitting: Cardiology

## 2011-09-01 ENCOUNTER — Emergency Department (HOSPITAL_COMMUNITY)
Admission: EM | Admit: 2011-09-01 | Discharge: 2011-09-01 | Disposition: A | Discharge status: Home / Self Care | Payer: Medicare Other | Attending: Emergency Medicine | Admitting: Emergency Medicine

## 2011-09-01 DIAGNOSIS — F17200 Nicotine dependence, unspecified, uncomplicated: Secondary | ICD-10-CM | POA: Diagnosis not present

## 2011-09-01 DIAGNOSIS — E119 Type 2 diabetes mellitus without complications: Secondary | ICD-10-CM | POA: Diagnosis not present

## 2011-09-01 DIAGNOSIS — E7800 Pure hypercholesterolemia, unspecified: Secondary | ICD-10-CM | POA: Insufficient documentation

## 2011-09-01 DIAGNOSIS — M542 Cervicalgia: Secondary | ICD-10-CM | POA: Diagnosis not present

## 2011-09-01 DIAGNOSIS — I10 Essential (primary) hypertension: Secondary | ICD-10-CM | POA: Diagnosis not present

## 2011-09-01 DIAGNOSIS — J449 Chronic obstructive pulmonary disease, unspecified: Secondary | ICD-10-CM | POA: Diagnosis not present

## 2011-09-01 DIAGNOSIS — S139XXA Sprain of joints and ligaments of unspecified parts of neck, initial encounter: Secondary | ICD-10-CM | POA: Diagnosis not present

## 2011-09-01 DIAGNOSIS — R112 Nausea with vomiting, unspecified: Secondary | ICD-10-CM | POA: Insufficient documentation

## 2011-09-01 DIAGNOSIS — J4489 Other specified chronic obstructive pulmonary disease: Secondary | ICD-10-CM | POA: Insufficient documentation

## 2011-09-01 DIAGNOSIS — X58XXXA Exposure to other specified factors, initial encounter: Secondary | ICD-10-CM | POA: Insufficient documentation

## 2011-09-01 DIAGNOSIS — R51 Headache: Secondary | ICD-10-CM | POA: Insufficient documentation

## 2011-09-01 DIAGNOSIS — S161XXA Strain of muscle, fascia and tendon at neck level, initial encounter: Secondary | ICD-10-CM

## 2011-09-01 LAB — DIFFERENTIAL
Basophils Relative: 0 % (ref 0–1)
Eosinophils Absolute: 0.2 10*3/uL (ref 0.0–0.7)
Lymphs Abs: 1.3 10*3/uL (ref 0.7–4.0)
Monocytes Relative: 5 % (ref 3–12)
Neutro Abs: 6.5 10*3/uL (ref 1.7–7.7)
Neutrophils Relative %: 77 % (ref 43–77)

## 2011-09-01 LAB — BASIC METABOLIC PANEL
BUN: 13 mg/dL (ref 6–23)
Chloride: 100 mEq/L (ref 96–112)
GFR calc Af Amer: >90 mL/min (ref >90)
GFR calc non Af Amer: >90 mL/min (ref >90)
Potassium: 3.8 mEq/L (ref 3.5–5.1)
Sodium: 138 mEq/L (ref 135–145)

## 2011-09-01 LAB — CBC
Hemoglobin: 15.5 g/dL — ABNORMAL HIGH (ref 12.0–15.0)
Platelets: 269 10*3/uL (ref 150–400)
RBC: 5.1 MIL/uL (ref 3.87–5.11)

## 2011-09-01 MED ORDER — SODIUM CHLORIDE 0.9 % IV BOLUS (SEPSIS)
1000.0000 mL | Freq: Once | INTRAVENOUS | Status: AC
  Administered 2011-09-01: 1000 mL via INTRAVENOUS

## 2011-09-01 MED ORDER — ONDANSETRON HCL 4 MG PO TABS: 4.0000 mg | ORAL_TABLET | Freq: Four times a day (QID) | ORAL | Status: AC

## 2011-09-01 MED ORDER — CYCLOBENZAPRINE HCL 10 MG PO TABS: 10.0000 mg | ORAL_TABLET | Freq: Two times a day (BID) | ORAL | Status: AC | PRN

## 2011-09-01 MED ORDER — DIPHENHYDRAMINE HCL 50 MG/ML IJ SOLN
25.0000 mg | Freq: Once | INTRAMUSCULAR | Status: AC
  Administered 2011-09-01: 25 mg via INTRAVENOUS
  Filled 2011-09-01: qty 1

## 2011-09-01 MED ORDER — KETOROLAC TROMETHAMINE 30 MG/ML IJ SOLN
30.0000 mg | Freq: Once | INTRAMUSCULAR | Status: AC
  Administered 2011-09-01: 30 mg via INTRAVENOUS
  Filled 2011-09-01: qty 1

## 2011-09-01 MED ORDER — METOCLOPRAMIDE HCL 5 MG/ML IJ SOLN
10.0000 mg | Freq: Once | INTRAMUSCULAR | Status: AC
  Administered 2011-09-01: 10 mg via INTRAVENOUS
  Filled 2011-09-01: qty 2

## 2011-09-01 NOTE — ED Provider Notes (Signed)
History     CSN: [id number]  Arrival date & time 09/01/11  1214   First MD Initiated Contact with Patient 09/01/11 1220      Chief Complaint  Patient presents with  . Headache  . Nausea  . Emesis    (Consider location/radiation/quality/duration/timing/severity/associated sxs/prior treatment) HPI  66 year old female with multiple comorbidities including chronic headache and migraine is presenting to the ED with chief complaints of headache with associated nausea and vomiting. Patient states for the past 3 days she has been experiencing a gradual onset of pain to the base of her neck radiating to her head. She describes sensation as a sharp, throbbing pain. Pain felt different from her usual migraine. She has taking oxycodone at home with no improvement. Patient mentioned that she had a neck surgery 3 months ago when she has a plate that was placed by Dr. Lovell Sheehan. She has been having intermittent pain from her neck to her head since then. Today pain is so severe that she felt nauseated and vomited. She denies fever, lightheadedness sound sensitivity, ear pain, chest pain, or shortness of breath. She denies numbness or tingling sensation, or weakness. She has not taken any medication today because she can't keep any of her medication down. She has called her primary care Dr. and he recommend for her to come to the ER for further management.  Past Medical History  Diagnosis Date  . Diabetes mellitus   . Hypertension   . High cholesterol   . COPD (chronic obstructive pulmonary disease)     Past Surgical History  Procedure Date  . Neck surgery   . Back surgery     No family history on file.  History  Substance Use Topics  . Smoking status: Current Everyday Smoker -- 0.5 packs/day for 20 years    Types: Cigarettes  . Smokeless tobacco: Never Used  . Alcohol Use: Not on file    OB History    Grav Para Term Preterm Abortions TAB SAB Ect Mult Living                  Review of  Systems  All other systems reviewed and are negative.    Allergies  Codeine; Nasonex; and Sulfa antibiotics  Home Medications   Current Outpatient Rx  Name Route Sig Dispense Refill  . ALBUTEROL SULFATE HFA 108 (90 BASE) MCG/ACT IN AERS Inhalation Inhale 2 puffs into the lungs every 6 (six) hours as needed. For shortness of breath.     . ALPRAZOLAM 0.5 MG PO TABS Oral Take 0.5-1 mg by mouth daily as needed. For anxiety.     . ASPIRIN 81 MG PO TBEC Oral Take 1 tablet (81 mg total) by mouth daily. 30 tablet 0  . CYCLOBENZAPRINE HCL 10 MG PO TABS Oral Take 10 mg by mouth 3 (three) times daily as needed. For muscle spasms.     Marland Kitchen ESOMEPRAZOLE MAGNESIUM 40 MG PO CPDR Oral Take 40 mg by mouth 2 (two) times daily.      . FENOFIBRATE 145 MG PO TABS Oral Take 145 mg by mouth daily.      Marland Kitchen FLUTICASONE-SALMETEROL 250-50 MCG/DOSE IN AEPB Inhalation Inhale 1 puff into the lungs every 12 (twelve) hours.      . IBUPROFEN 200 MG PO TABS Oral Take 400 mg by mouth every 6 (six) hours as needed. For pain.     Marland Kitchen LISINOPRIL-HYDROCHLOROTHIAZIDE 20-25 MG PO TABS Oral Take 1 tablet by mouth daily.      Marland Kitchen  METFORMIN HCL ER 500 MG PO TB24 Oral Take 1,000 mg by mouth daily.      Marland Kitchen METOPROLOL SUCCINATE ER 50 MG PO TB24 Oral Take 50 mg by mouth daily.      . OXYCODONE-ACETAMINOPHEN 10-325 MG PO TABS Oral Take 0.5-1 tablets by mouth every 6 (six) hours as needed. For pain.     Marland Kitchen PREDNISOLONE ACETATE 1 % OP SUSP Both Eyes Place 1 drop into both eyes daily.       There were no vitals taken for this visit.  Physical Exam  Nursing note and vitals reviewed. Constitutional: She is oriented to person, place, and time. She appears well-developed and well-nourished.       Patient appeared tearful, holding her head.  HENT:  Head: Normocephalic and atraumatic.  Mouth/Throat: Oropharynx is clear and moist. No oropharyngeal exudate.  Eyes: Conjunctivae and EOM are normal. Pupils are equal, round, and reactive to light.    Neck: Neck supple. No tracheal deviation present.       Increasing pain with neck flexion and extension, normal neck rotation. No overlying skin changes.  Cardiovascular: Normal rate and regular rhythm.   Pulmonary/Chest: Effort normal and breath sounds normal. No respiratory distress.  Abdominal: Soft.  Musculoskeletal: She exhibits no tenderness.       Cervical back: She exhibits decreased range of motion, tenderness and pain. She exhibits no bony tenderness, no swelling and no edema.       Thoracic back: Normal.       Lumbar back: Normal.  Lymphadenopathy:    She has no cervical adenopathy.  Neurological: She is alert and oriented to person, place, and time. No cranial nerve deficit. Coordination normal.  Skin: Skin is warm.    ED Course  Procedures (including critical care time)  Labs Reviewed - No data to display No results found.   No diagnosis found.    MDM  Gradual onset of neck pain radiates to head. Patient appears to be tearful and in apparent distress. She has had prior neck surgery 3 months ago for placement of neck plate after bony spur removal. No overlying skin changes concerning for infection on my initial evaluation. Patient is afebrile. This symptom does not suggest a stroke or meningitis. I will give patient a migraine cocktail, i will obtain head and neck CT. I will continue to monitor patient.  2:18 PM On reevaluation patient states her headache has completely gone. She is currently in no acute distress. She is able to communicate without difficulty. She is able to move her head in all directions without difficulty. All workup today was unremarkable. Both heads and neck CT scan shows no acute finding. No evidence of infection based on her CBC. Electrolytes all within normal limits. Reassurance given. Patient is able to tolerate by mouth. Patient will be discharged with antiemetic medication.  I anticipate that her headache is likely secondary to musculoskeletal  pain from suboptimal head positioning after her neck surgical procedure.        Fayrene Helper, PA-C 09/01/11 1446

## 2011-09-01 NOTE — ED Provider Notes (Signed)
66 year old female status post neck surgery comes in with a two-day history of a right occipital headache. Spasm of the paracervical muscles and tenderness at the insertion site. Headache clinically appears to be muscle contraction headache. She got very good relief with IV Reglan and IV Benadryl.  Dione Booze, MD 09/01/11 1447

## 2011-09-01 NOTE — ED Notes (Signed)
Patient transported to CT 

## 2011-09-01 NOTE — ED Notes (Signed)
Pt reports that she has a headache with n/v that started on Saturday. States that pain starts in her neck and travels to the base of her head and into her shoulders. Reports that she has surgery on her neck to remove spurs and placed two plates in her neck.

## 2011-09-02 NOTE — ED Provider Notes (Signed)
Medical screening examination/treatment/procedure(s) were conducted as a shared visit with non-physician practitioner(s) and myself.  I personally evaluated the patient during the encounter  Dione Booze, MD 09/02/11 [phone]

## 2011-09-10 DIAGNOSIS — J449 Chronic obstructive pulmonary disease, unspecified: Secondary | ICD-10-CM | POA: Diagnosis not present

## 2011-09-10 DIAGNOSIS — G4710 Hypersomnia, unspecified: Secondary | ICD-10-CM | POA: Diagnosis not present

## 2011-09-10 DIAGNOSIS — D869 Sarcoidosis, unspecified: Secondary | ICD-10-CM | POA: Diagnosis not present

## 2011-09-10 DIAGNOSIS — F17200 Nicotine dependence, unspecified, uncomplicated: Secondary | ICD-10-CM | POA: Diagnosis not present

## 2011-09-10 DIAGNOSIS — J310 Chronic rhinitis: Secondary | ICD-10-CM | POA: Diagnosis not present

## 2011-09-11 DIAGNOSIS — G4710 Hypersomnia, unspecified: Secondary | ICD-10-CM | POA: Diagnosis not present

## 2011-09-11 DIAGNOSIS — G4730 Sleep apnea, unspecified: Secondary | ICD-10-CM | POA: Diagnosis not present

## 2011-09-19 DIAGNOSIS — G4710 Hypersomnia, unspecified: Secondary | ICD-10-CM | POA: Diagnosis not present

## 2011-09-24 ENCOUNTER — Other Ambulatory Visit (HOSPITAL_COMMUNITY): Payer: Self-pay | Admitting: Internal Medicine

## 2011-10-06 DIAGNOSIS — R1013 Epigastric pain: Secondary | ICD-10-CM | POA: Diagnosis not present

## 2011-10-07 DIAGNOSIS — R1013 Epigastric pain: Secondary | ICD-10-CM | POA: Diagnosis not present

## 2011-10-08 DIAGNOSIS — R1013 Epigastric pain: Secondary | ICD-10-CM | POA: Diagnosis not present

## 2011-10-13 DIAGNOSIS — K838 Other specified diseases of biliary tract: Secondary | ICD-10-CM | POA: Diagnosis not present

## 2011-10-13 DIAGNOSIS — R112 Nausea with vomiting, unspecified: Secondary | ICD-10-CM | POA: Diagnosis not present

## 2011-10-13 DIAGNOSIS — R1084 Generalized abdominal pain: Secondary | ICD-10-CM | POA: Diagnosis not present

## 2011-10-13 DIAGNOSIS — R109 Unspecified abdominal pain: Secondary | ICD-10-CM | POA: Diagnosis not present

## 2011-10-14 DIAGNOSIS — R1013 Epigastric pain: Secondary | ICD-10-CM | POA: Diagnosis not present

## 2011-10-15 DIAGNOSIS — J449 Chronic obstructive pulmonary disease, unspecified: Secondary | ICD-10-CM | POA: Diagnosis not present

## 2011-10-15 DIAGNOSIS — G4730 Sleep apnea, unspecified: Secondary | ICD-10-CM | POA: Diagnosis not present

## 2011-10-15 DIAGNOSIS — K2940 Chronic atrophic gastritis without bleeding: Secondary | ICD-10-CM | POA: Diagnosis not present

## 2011-10-15 DIAGNOSIS — K219 Gastro-esophageal reflux disease without esophagitis: Secondary | ICD-10-CM | POA: Diagnosis not present

## 2011-10-15 DIAGNOSIS — R6881 Early satiety: Secondary | ICD-10-CM | POA: Diagnosis not present

## 2011-10-15 DIAGNOSIS — R1013 Epigastric pain: Secondary | ICD-10-CM | POA: Diagnosis not present

## 2011-10-15 DIAGNOSIS — E119 Type 2 diabetes mellitus without complications: Secondary | ICD-10-CM | POA: Diagnosis not present

## 2011-10-15 DIAGNOSIS — K7689 Other specified diseases of liver: Secondary | ICD-10-CM | POA: Diagnosis not present

## 2011-10-15 DIAGNOSIS — K2990 Gastroduodenitis, unspecified, without bleeding: Secondary | ICD-10-CM | POA: Diagnosis not present

## 2011-10-15 DIAGNOSIS — I129 Hypertensive chronic kidney disease with stage 1 through stage 4 chronic kidney disease, or unspecified chronic kidney disease: Secondary | ICD-10-CM | POA: Diagnosis not present

## 2011-10-15 DIAGNOSIS — N189 Chronic kidney disease, unspecified: Secondary | ICD-10-CM | POA: Diagnosis not present

## 2011-10-15 DIAGNOSIS — K2900 Acute gastritis without bleeding: Secondary | ICD-10-CM | POA: Diagnosis not present

## 2011-10-15 DIAGNOSIS — K2970 Gastritis, unspecified, without bleeding: Secondary | ICD-10-CM | POA: Diagnosis not present

## 2011-10-15 DIAGNOSIS — F17200 Nicotine dependence, unspecified, uncomplicated: Secondary | ICD-10-CM | POA: Diagnosis not present

## 2011-10-15 DIAGNOSIS — Z7982 Long term (current) use of aspirin: Secondary | ICD-10-CM | POA: Diagnosis not present

## 2011-10-15 DIAGNOSIS — Z79899 Other long term (current) drug therapy: Secondary | ICD-10-CM | POA: Diagnosis not present

## 2011-10-15 DIAGNOSIS — E785 Hyperlipidemia, unspecified: Secondary | ICD-10-CM | POA: Diagnosis not present

## 2011-11-19 DIAGNOSIS — J329 Chronic sinusitis, unspecified: Secondary | ICD-10-CM | POA: Diagnosis not present

## 2011-11-19 DIAGNOSIS — J441 Chronic obstructive pulmonary disease with (acute) exacerbation: Secondary | ICD-10-CM | POA: Diagnosis not present

## 2011-11-21 DIAGNOSIS — D869 Sarcoidosis, unspecified: Secondary | ICD-10-CM | POA: Diagnosis not present

## 2011-11-21 DIAGNOSIS — G4710 Hypersomnia, unspecified: Secondary | ICD-10-CM | POA: Diagnosis not present

## 2011-11-21 DIAGNOSIS — R5381 Other malaise: Secondary | ICD-10-CM | POA: Diagnosis not present

## 2011-11-21 DIAGNOSIS — R5383 Other fatigue: Secondary | ICD-10-CM | POA: Diagnosis not present

## 2011-11-21 DIAGNOSIS — J310 Chronic rhinitis: Secondary | ICD-10-CM | POA: Diagnosis not present

## 2011-11-21 DIAGNOSIS — G4730 Sleep apnea, unspecified: Secondary | ICD-10-CM | POA: Diagnosis not present

## 2011-11-22 DIAGNOSIS — D869 Sarcoidosis, unspecified: Secondary | ICD-10-CM | POA: Diagnosis not present

## 2011-11-22 DIAGNOSIS — G4710 Hypersomnia, unspecified: Secondary | ICD-10-CM | POA: Diagnosis not present

## 2011-11-22 DIAGNOSIS — R5383 Other fatigue: Secondary | ICD-10-CM | POA: Diagnosis not present

## 2011-11-22 DIAGNOSIS — J310 Chronic rhinitis: Secondary | ICD-10-CM | POA: Diagnosis not present

## 2011-12-17 DIAGNOSIS — J449 Chronic obstructive pulmonary disease, unspecified: Secondary | ICD-10-CM | POA: Diagnosis not present

## 2011-12-31 DIAGNOSIS — H6690 Otitis media, unspecified, unspecified ear: Secondary | ICD-10-CM | POA: Diagnosis not present

## 2011-12-31 DIAGNOSIS — H906 Mixed conductive and sensorineural hearing loss, bilateral: Secondary | ICD-10-CM | POA: Diagnosis not present

## 2012-01-13 DIAGNOSIS — E782 Mixed hyperlipidemia: Secondary | ICD-10-CM | POA: Diagnosis not present

## 2012-01-13 DIAGNOSIS — R5381 Other malaise: Secondary | ICD-10-CM | POA: Diagnosis not present

## 2012-01-13 DIAGNOSIS — E119 Type 2 diabetes mellitus without complications: Secondary | ICD-10-CM | POA: Diagnosis not present

## 2012-01-13 DIAGNOSIS — I10 Essential (primary) hypertension: Secondary | ICD-10-CM | POA: Diagnosis not present

## 2012-01-13 DIAGNOSIS — R5383 Other fatigue: Secondary | ICD-10-CM | POA: Diagnosis not present

## 2012-02-17 DIAGNOSIS — M47812 Spondylosis without myelopathy or radiculopathy, cervical region: Secondary | ICD-10-CM | POA: Diagnosis not present

## 2012-02-20 DIAGNOSIS — G4710 Hypersomnia, unspecified: Secondary | ICD-10-CM | POA: Diagnosis not present

## 2012-02-20 DIAGNOSIS — E559 Vitamin D deficiency, unspecified: Secondary | ICD-10-CM | POA: Diagnosis not present

## 2012-02-20 DIAGNOSIS — D869 Sarcoidosis, unspecified: Secondary | ICD-10-CM | POA: Diagnosis not present

## 2012-02-20 DIAGNOSIS — R5383 Other fatigue: Secondary | ICD-10-CM | POA: Diagnosis not present

## 2012-02-20 DIAGNOSIS — F17200 Nicotine dependence, unspecified, uncomplicated: Secondary | ICD-10-CM | POA: Diagnosis not present

## 2012-02-20 DIAGNOSIS — G4730 Sleep apnea, unspecified: Secondary | ICD-10-CM | POA: Diagnosis not present

## 2012-02-20 DIAGNOSIS — R5381 Other malaise: Secondary | ICD-10-CM | POA: Diagnosis not present

## 2012-02-23 DIAGNOSIS — G4730 Sleep apnea, unspecified: Secondary | ICD-10-CM | POA: Diagnosis not present

## 2012-02-23 DIAGNOSIS — G4710 Hypersomnia, unspecified: Secondary | ICD-10-CM | POA: Diagnosis not present

## 2012-02-23 DIAGNOSIS — D869 Sarcoidosis, unspecified: Secondary | ICD-10-CM | POA: Diagnosis not present

## 2012-02-23 DIAGNOSIS — R5383 Other fatigue: Secondary | ICD-10-CM | POA: Diagnosis not present

## 2012-02-23 DIAGNOSIS — F17200 Nicotine dependence, unspecified, uncomplicated: Secondary | ICD-10-CM | POA: Diagnosis not present

## 2012-02-25 DIAGNOSIS — M5412 Radiculopathy, cervical region: Secondary | ICD-10-CM | POA: Diagnosis not present

## 2012-02-25 DIAGNOSIS — M542 Cervicalgia: Secondary | ICD-10-CM | POA: Diagnosis not present

## 2012-03-01 DIAGNOSIS — M542 Cervicalgia: Secondary | ICD-10-CM | POA: Diagnosis not present

## 2012-03-01 DIAGNOSIS — M5412 Radiculopathy, cervical region: Secondary | ICD-10-CM | POA: Diagnosis not present

## 2012-03-04 DIAGNOSIS — M5412 Radiculopathy, cervical region: Secondary | ICD-10-CM | POA: Diagnosis not present

## 2012-03-04 DIAGNOSIS — M542 Cervicalgia: Secondary | ICD-10-CM | POA: Diagnosis not present

## 2012-03-08 ENCOUNTER — Ambulatory Visit
Admission: RE | Admit: 2012-03-08 | Discharge: 2012-03-08 | Disposition: A | Discharge status: Home / Self Care | Payer: Medicare Other | Source: Ambulatory Visit | Attending: Neurosurgery | Admitting: Neurosurgery

## 2012-03-08 ENCOUNTER — Other Ambulatory Visit: Payer: Self-pay | Admitting: Neurosurgery

## 2012-03-08 DIAGNOSIS — M542 Cervicalgia: Secondary | ICD-10-CM | POA: Diagnosis not present

## 2012-03-08 DIAGNOSIS — M549 Dorsalgia, unspecified: Secondary | ICD-10-CM | POA: Diagnosis not present

## 2012-03-08 DIAGNOSIS — E785 Hyperlipidemia, unspecified: Secondary | ICD-10-CM | POA: Diagnosis not present

## 2012-03-08 DIAGNOSIS — M5412 Radiculopathy, cervical region: Secondary | ICD-10-CM | POA: Diagnosis not present

## 2012-03-11 DIAGNOSIS — M5412 Radiculopathy, cervical region: Secondary | ICD-10-CM | POA: Diagnosis not present

## 2012-03-11 DIAGNOSIS — M542 Cervicalgia: Secondary | ICD-10-CM | POA: Diagnosis not present

## 2012-03-15 DIAGNOSIS — M5412 Radiculopathy, cervical region: Secondary | ICD-10-CM | POA: Diagnosis not present

## 2012-03-15 DIAGNOSIS — M542 Cervicalgia: Secondary | ICD-10-CM | POA: Diagnosis not present

## 2012-03-23 DIAGNOSIS — E119 Type 2 diabetes mellitus without complications: Secondary | ICD-10-CM | POA: Diagnosis not present

## 2012-03-23 DIAGNOSIS — M542 Cervicalgia: Secondary | ICD-10-CM | POA: Diagnosis not present

## 2012-03-23 DIAGNOSIS — M5412 Radiculopathy, cervical region: Secondary | ICD-10-CM | POA: Diagnosis not present

## 2012-03-23 DIAGNOSIS — H524 Presbyopia: Secondary | ICD-10-CM | POA: Diagnosis not present

## 2012-03-25 DIAGNOSIS — M542 Cervicalgia: Secondary | ICD-10-CM | POA: Diagnosis not present

## 2012-03-25 DIAGNOSIS — M5412 Radiculopathy, cervical region: Secondary | ICD-10-CM | POA: Diagnosis not present

## 2012-03-29 DIAGNOSIS — M5412 Radiculopathy, cervical region: Secondary | ICD-10-CM | POA: Diagnosis not present

## 2012-03-29 DIAGNOSIS — M542 Cervicalgia: Secondary | ICD-10-CM | POA: Diagnosis not present

## 2012-03-31 DIAGNOSIS — M5412 Radiculopathy, cervical region: Secondary | ICD-10-CM | POA: Diagnosis not present

## 2012-03-31 DIAGNOSIS — M542 Cervicalgia: Secondary | ICD-10-CM | POA: Diagnosis not present

## 2012-04-06 DIAGNOSIS — M5412 Radiculopathy, cervical region: Secondary | ICD-10-CM | POA: Diagnosis not present

## 2012-04-06 DIAGNOSIS — M542 Cervicalgia: Secondary | ICD-10-CM | POA: Diagnosis not present

## 2012-04-08 DIAGNOSIS — M542 Cervicalgia: Secondary | ICD-10-CM | POA: Diagnosis not present

## 2012-04-08 DIAGNOSIS — M5412 Radiculopathy, cervical region: Secondary | ICD-10-CM | POA: Diagnosis not present

## 2012-04-12 DIAGNOSIS — M542 Cervicalgia: Secondary | ICD-10-CM | POA: Diagnosis not present

## 2012-04-12 DIAGNOSIS — M5412 Radiculopathy, cervical region: Secondary | ICD-10-CM | POA: Diagnosis not present

## 2012-04-15 DIAGNOSIS — M542 Cervicalgia: Secondary | ICD-10-CM | POA: Diagnosis not present

## 2012-04-15 DIAGNOSIS — M5412 Radiculopathy, cervical region: Secondary | ICD-10-CM | POA: Diagnosis not present

## 2012-04-22 DIAGNOSIS — J309 Allergic rhinitis, unspecified: Secondary | ICD-10-CM | POA: Diagnosis not present

## 2012-04-22 DIAGNOSIS — M5412 Radiculopathy, cervical region: Secondary | ICD-10-CM | POA: Diagnosis not present

## 2012-04-22 DIAGNOSIS — M542 Cervicalgia: Secondary | ICD-10-CM | POA: Diagnosis not present

## 2012-04-26 DIAGNOSIS — M5412 Radiculopathy, cervical region: Secondary | ICD-10-CM | POA: Diagnosis not present

## 2012-04-26 DIAGNOSIS — M542 Cervicalgia: Secondary | ICD-10-CM | POA: Diagnosis not present

## 2012-04-28 DIAGNOSIS — M542 Cervicalgia: Secondary | ICD-10-CM | POA: Diagnosis not present

## 2012-04-28 DIAGNOSIS — M5412 Radiculopathy, cervical region: Secondary | ICD-10-CM | POA: Diagnosis not present

## 2012-05-06 DIAGNOSIS — Z23 Encounter for immunization: Secondary | ICD-10-CM | POA: Diagnosis not present

## 2012-05-17 DIAGNOSIS — IMO0001 Reserved for inherently not codable concepts without codable children: Secondary | ICD-10-CM | POA: Diagnosis not present

## 2012-05-17 DIAGNOSIS — E782 Mixed hyperlipidemia: Secondary | ICD-10-CM | POA: Diagnosis not present

## 2012-05-17 DIAGNOSIS — I10 Essential (primary) hypertension: Secondary | ICD-10-CM | POA: Diagnosis not present

## 2012-05-17 DIAGNOSIS — F329 Major depressive disorder, single episode, unspecified: Secondary | ICD-10-CM | POA: Diagnosis not present

## 2012-05-18 DIAGNOSIS — M47812 Spondylosis without myelopathy or radiculopathy, cervical region: Secondary | ICD-10-CM | POA: Diagnosis not present

## 2012-05-28 DIAGNOSIS — R5383 Other fatigue: Secondary | ICD-10-CM | POA: Diagnosis not present

## 2012-05-28 DIAGNOSIS — R5381 Other malaise: Secondary | ICD-10-CM | POA: Diagnosis not present

## 2012-05-28 DIAGNOSIS — G4710 Hypersomnia, unspecified: Secondary | ICD-10-CM | POA: Diagnosis not present

## 2012-05-28 DIAGNOSIS — G4730 Sleep apnea, unspecified: Secondary | ICD-10-CM | POA: Diagnosis not present

## 2012-05-28 DIAGNOSIS — D869 Sarcoidosis, unspecified: Secondary | ICD-10-CM | POA: Diagnosis not present

## 2012-05-28 DIAGNOSIS — I2789 Other specified pulmonary heart diseases: Secondary | ICD-10-CM | POA: Diagnosis not present

## 2012-05-31 DIAGNOSIS — G4730 Sleep apnea, unspecified: Secondary | ICD-10-CM | POA: Diagnosis not present

## 2012-05-31 DIAGNOSIS — G4710 Hypersomnia, unspecified: Secondary | ICD-10-CM | POA: Diagnosis not present

## 2012-06-02 DIAGNOSIS — I10 Essential (primary) hypertension: Secondary | ICD-10-CM | POA: Diagnosis not present

## 2012-06-02 DIAGNOSIS — J988 Other specified respiratory disorders: Secondary | ICD-10-CM | POA: Diagnosis not present

## 2012-06-02 DIAGNOSIS — J441 Chronic obstructive pulmonary disease with (acute) exacerbation: Secondary | ICD-10-CM | POA: Diagnosis not present

## 2012-06-02 DIAGNOSIS — E7800 Pure hypercholesterolemia, unspecified: Secondary | ICD-10-CM | POA: Diagnosis not present

## 2012-06-02 DIAGNOSIS — F17200 Nicotine dependence, unspecified, uncomplicated: Secondary | ICD-10-CM | POA: Diagnosis not present

## 2012-06-02 DIAGNOSIS — J189 Pneumonia, unspecified organism: Secondary | ICD-10-CM | POA: Diagnosis not present

## 2012-06-02 DIAGNOSIS — Z8673 Personal history of transient ischemic attack (TIA), and cerebral infarction without residual deficits: Secondary | ICD-10-CM | POA: Diagnosis not present

## 2012-06-02 DIAGNOSIS — R0602 Shortness of breath: Secondary | ICD-10-CM | POA: Diagnosis not present

## 2012-06-02 DIAGNOSIS — E119 Type 2 diabetes mellitus without complications: Secondary | ICD-10-CM | POA: Diagnosis not present

## 2012-06-04 DIAGNOSIS — G4710 Hypersomnia, unspecified: Secondary | ICD-10-CM | POA: Diagnosis not present

## 2012-06-04 DIAGNOSIS — I2789 Other specified pulmonary heart diseases: Secondary | ICD-10-CM | POA: Diagnosis not present

## 2012-06-04 DIAGNOSIS — J45901 Unspecified asthma with (acute) exacerbation: Secondary | ICD-10-CM | POA: Diagnosis not present

## 2012-06-04 DIAGNOSIS — R5383 Other fatigue: Secondary | ICD-10-CM | POA: Diagnosis not present

## 2012-06-04 DIAGNOSIS — D869 Sarcoidosis, unspecified: Secondary | ICD-10-CM | POA: Diagnosis not present

## 2012-06-30 DIAGNOSIS — G4710 Hypersomnia, unspecified: Secondary | ICD-10-CM | POA: Diagnosis not present

## 2012-06-30 DIAGNOSIS — D869 Sarcoidosis, unspecified: Secondary | ICD-10-CM | POA: Diagnosis not present

## 2012-06-30 DIAGNOSIS — G4730 Sleep apnea, unspecified: Secondary | ICD-10-CM | POA: Diagnosis not present

## 2012-06-30 DIAGNOSIS — R5383 Other fatigue: Secondary | ICD-10-CM | POA: Diagnosis not present

## 2012-06-30 DIAGNOSIS — J45909 Unspecified asthma, uncomplicated: Secondary | ICD-10-CM | POA: Diagnosis not present

## 2012-07-07 ENCOUNTER — Other Ambulatory Visit: Payer: Self-pay | Admitting: Neurosurgery

## 2012-07-07 DIAGNOSIS — M542 Cervicalgia: Secondary | ICD-10-CM

## 2012-07-08 ENCOUNTER — Ambulatory Visit
Admission: RE | Admit: 2012-07-08 | Discharge: 2012-07-08 | Disposition: A | Discharge status: Home / Self Care | Payer: Medicare Other | Source: Ambulatory Visit | Attending: Neurosurgery | Admitting: Neurosurgery

## 2012-07-08 DIAGNOSIS — M542 Cervicalgia: Secondary | ICD-10-CM | POA: Diagnosis not present

## 2012-07-08 DIAGNOSIS — Z981 Arthrodesis status: Secondary | ICD-10-CM | POA: Diagnosis not present

## 2012-07-16 DIAGNOSIS — M542 Cervicalgia: Secondary | ICD-10-CM | POA: Diagnosis not present

## 2012-07-30 DIAGNOSIS — M542 Cervicalgia: Secondary | ICD-10-CM | POA: Diagnosis not present

## 2012-08-22 DIAGNOSIS — I10 Essential (primary) hypertension: Secondary | ICD-10-CM | POA: Diagnosis not present

## 2012-08-22 DIAGNOSIS — K7689 Other specified diseases of liver: Secondary | ICD-10-CM | POA: Diagnosis not present

## 2012-08-22 DIAGNOSIS — K219 Gastro-esophageal reflux disease without esophagitis: Secondary | ICD-10-CM | POA: Diagnosis not present

## 2012-08-22 DIAGNOSIS — R300 Dysuria: Secondary | ICD-10-CM | POA: Diagnosis not present

## 2012-08-22 DIAGNOSIS — Z951 Presence of aortocoronary bypass graft: Secondary | ICD-10-CM | POA: Diagnosis not present

## 2012-08-22 DIAGNOSIS — J449 Chronic obstructive pulmonary disease, unspecified: Secondary | ICD-10-CM | POA: Diagnosis not present

## 2012-08-22 DIAGNOSIS — F411 Generalized anxiety disorder: Secondary | ICD-10-CM | POA: Diagnosis not present

## 2012-08-22 DIAGNOSIS — E785 Hyperlipidemia, unspecified: Secondary | ICD-10-CM | POA: Diagnosis not present

## 2012-09-01 DIAGNOSIS — R300 Dysuria: Secondary | ICD-10-CM | POA: Diagnosis not present

## 2012-09-01 DIAGNOSIS — F341 Dysthymic disorder: Secondary | ICD-10-CM | POA: Diagnosis not present

## 2012-10-01 DIAGNOSIS — G4710 Hypersomnia, unspecified: Secondary | ICD-10-CM | POA: Diagnosis not present

## 2012-10-01 DIAGNOSIS — R5381 Other malaise: Secondary | ICD-10-CM | POA: Diagnosis not present

## 2012-10-01 DIAGNOSIS — J45909 Unspecified asthma, uncomplicated: Secondary | ICD-10-CM | POA: Diagnosis not present

## 2012-10-01 DIAGNOSIS — R5383 Other fatigue: Secondary | ICD-10-CM | POA: Diagnosis not present

## 2012-10-01 DIAGNOSIS — J310 Chronic rhinitis: Secondary | ICD-10-CM | POA: Diagnosis not present

## 2012-10-01 DIAGNOSIS — E559 Vitamin D deficiency, unspecified: Secondary | ICD-10-CM | POA: Diagnosis not present

## 2012-10-04 DIAGNOSIS — R0989 Other specified symptoms and signs involving the circulatory and respiratory systems: Secondary | ICD-10-CM | POA: Diagnosis not present

## 2012-10-04 DIAGNOSIS — R609 Edema, unspecified: Secondary | ICD-10-CM | POA: Diagnosis not present

## 2012-10-04 DIAGNOSIS — Z006 Encounter for examination for normal comparison and control in clinical research program: Secondary | ICD-10-CM | POA: Diagnosis not present

## 2012-10-04 DIAGNOSIS — J209 Acute bronchitis, unspecified: Secondary | ICD-10-CM | POA: Diagnosis not present

## 2012-10-19 DIAGNOSIS — I10 Essential (primary) hypertension: Secondary | ICD-10-CM | POA: Diagnosis not present

## 2012-10-19 DIAGNOSIS — F17200 Nicotine dependence, unspecified, uncomplicated: Secondary | ICD-10-CM | POA: Diagnosis not present

## 2012-10-19 DIAGNOSIS — Z79899 Other long term (current) drug therapy: Secondary | ICD-10-CM | POA: Diagnosis not present

## 2012-10-21 DIAGNOSIS — E782 Mixed hyperlipidemia: Secondary | ICD-10-CM | POA: Diagnosis not present

## 2012-10-21 DIAGNOSIS — Z79899 Other long term (current) drug therapy: Secondary | ICD-10-CM | POA: Diagnosis not present

## 2012-11-23 DIAGNOSIS — Z9181 History of falling: Secondary | ICD-10-CM | POA: Diagnosis not present

## 2012-11-23 DIAGNOSIS — Z1331 Encounter for screening for depression: Secondary | ICD-10-CM | POA: Diagnosis not present

## 2012-11-23 DIAGNOSIS — J309 Allergic rhinitis, unspecified: Secondary | ICD-10-CM | POA: Diagnosis not present

## 2012-11-23 DIAGNOSIS — Z6837 Body mass index (BMI) 37.0-37.9, adult: Secondary | ICD-10-CM | POA: Diagnosis not present

## 2012-12-08 DIAGNOSIS — Z1231 Encounter for screening mammogram for malignant neoplasm of breast: Secondary | ICD-10-CM | POA: Diagnosis not present

## 2012-12-08 DIAGNOSIS — N951 Menopausal and female climacteric states: Secondary | ICD-10-CM | POA: Diagnosis not present

## 2013-01-14 DIAGNOSIS — G4730 Sleep apnea, unspecified: Secondary | ICD-10-CM | POA: Diagnosis not present

## 2013-01-14 DIAGNOSIS — J310 Chronic rhinitis: Secondary | ICD-10-CM | POA: Diagnosis not present

## 2013-01-14 DIAGNOSIS — R5383 Other fatigue: Secondary | ICD-10-CM | POA: Diagnosis not present

## 2013-01-14 DIAGNOSIS — G4710 Hypersomnia, unspecified: Secondary | ICD-10-CM | POA: Diagnosis not present

## 2013-01-14 DIAGNOSIS — R5381 Other malaise: Secondary | ICD-10-CM | POA: Diagnosis not present

## 2013-01-14 DIAGNOSIS — E559 Vitamin D deficiency, unspecified: Secondary | ICD-10-CM | POA: Diagnosis not present

## 2013-01-14 DIAGNOSIS — D869 Sarcoidosis, unspecified: Secondary | ICD-10-CM | POA: Diagnosis not present

## 2013-01-14 DIAGNOSIS — J45909 Unspecified asthma, uncomplicated: Secondary | ICD-10-CM | POA: Diagnosis not present

## 2013-01-14 DIAGNOSIS — F17200 Nicotine dependence, unspecified, uncomplicated: Secondary | ICD-10-CM | POA: Diagnosis not present

## 2013-01-17 DIAGNOSIS — G4730 Sleep apnea, unspecified: Secondary | ICD-10-CM | POA: Diagnosis not present

## 2013-01-19 DIAGNOSIS — E119 Type 2 diabetes mellitus without complications: Secondary | ICD-10-CM | POA: Diagnosis not present

## 2013-01-19 DIAGNOSIS — R1013 Epigastric pain: Secondary | ICD-10-CM | POA: Diagnosis not present

## 2013-01-19 DIAGNOSIS — K219 Gastro-esophageal reflux disease without esophagitis: Secondary | ICD-10-CM | POA: Diagnosis not present

## 2013-01-19 DIAGNOSIS — E782 Mixed hyperlipidemia: Secondary | ICD-10-CM | POA: Diagnosis not present

## 2013-02-24 DIAGNOSIS — S93409A Sprain of unspecified ligament of unspecified ankle, initial encounter: Secondary | ICD-10-CM | POA: Diagnosis not present

## 2013-03-28 DIAGNOSIS — H9210 Otorrhea, unspecified ear: Secondary | ICD-10-CM | POA: Diagnosis not present

## 2013-04-06 DIAGNOSIS — H9210 Otorrhea, unspecified ear: Secondary | ICD-10-CM | POA: Diagnosis not present

## 2013-04-13 DIAGNOSIS — J441 Chronic obstructive pulmonary disease with (acute) exacerbation: Secondary | ICD-10-CM | POA: Diagnosis not present

## 2013-04-13 DIAGNOSIS — J309 Allergic rhinitis, unspecified: Secondary | ICD-10-CM | POA: Diagnosis not present

## 2013-04-17 DIAGNOSIS — E1159 Type 2 diabetes mellitus with other circulatory complications: Secondary | ICD-10-CM | POA: Diagnosis not present

## 2013-04-17 DIAGNOSIS — J441 Chronic obstructive pulmonary disease with (acute) exacerbation: Secondary | ICD-10-CM | POA: Diagnosis not present

## 2013-04-17 DIAGNOSIS — Z7982 Long term (current) use of aspirin: Secondary | ICD-10-CM | POA: Diagnosis not present

## 2013-04-17 DIAGNOSIS — E119 Type 2 diabetes mellitus without complications: Secondary | ICD-10-CM | POA: Diagnosis not present

## 2013-04-17 DIAGNOSIS — F17200 Nicotine dependence, unspecified, uncomplicated: Secondary | ICD-10-CM | POA: Diagnosis present

## 2013-04-17 DIAGNOSIS — Z79899 Other long term (current) drug therapy: Secondary | ICD-10-CM | POA: Diagnosis not present

## 2013-04-17 DIAGNOSIS — R0789 Other chest pain: Secondary | ICD-10-CM | POA: Diagnosis not present

## 2013-04-17 DIAGNOSIS — R079 Chest pain, unspecified: Secondary | ICD-10-CM | POA: Diagnosis not present

## 2013-04-17 DIAGNOSIS — K219 Gastro-esophageal reflux disease without esophagitis: Secondary | ICD-10-CM | POA: Diagnosis not present

## 2013-04-17 DIAGNOSIS — R0602 Shortness of breath: Secondary | ICD-10-CM | POA: Diagnosis not present

## 2013-04-17 DIAGNOSIS — J9620 Acute and chronic respiratory failure, unspecified whether with hypoxia or hypercapnia: Secondary | ICD-10-CM | POA: Diagnosis not present

## 2013-04-17 DIAGNOSIS — Z8673 Personal history of transient ischemic attack (TIA), and cerebral infarction without residual deficits: Secondary | ICD-10-CM | POA: Diagnosis not present

## 2013-04-17 DIAGNOSIS — J9600 Acute respiratory failure, unspecified whether with hypoxia or hypercapnia: Secondary | ICD-10-CM | POA: Diagnosis not present

## 2013-04-17 DIAGNOSIS — I10 Essential (primary) hypertension: Secondary | ICD-10-CM | POA: Diagnosis not present

## 2013-04-17 DIAGNOSIS — Z23 Encounter for immunization: Secondary | ICD-10-CM | POA: Diagnosis not present

## 2013-04-17 DIAGNOSIS — J180 Bronchopneumonia, unspecified organism: Secondary | ICD-10-CM | POA: Diagnosis not present

## 2013-04-17 DIAGNOSIS — E785 Hyperlipidemia, unspecified: Secondary | ICD-10-CM | POA: Diagnosis not present

## 2013-04-22 DIAGNOSIS — J9620 Acute and chronic respiratory failure, unspecified whether with hypoxia or hypercapnia: Secondary | ICD-10-CM | POA: Diagnosis not present

## 2013-04-22 DIAGNOSIS — E119 Type 2 diabetes mellitus without complications: Secondary | ICD-10-CM | POA: Diagnosis not present

## 2013-04-22 DIAGNOSIS — J441 Chronic obstructive pulmonary disease with (acute) exacerbation: Secondary | ICD-10-CM | POA: Diagnosis not present

## 2013-04-28 DIAGNOSIS — J441 Chronic obstructive pulmonary disease with (acute) exacerbation: Secondary | ICD-10-CM | POA: Diagnosis not present

## 2013-04-28 DIAGNOSIS — I10 Essential (primary) hypertension: Secondary | ICD-10-CM | POA: Diagnosis not present

## 2013-04-28 DIAGNOSIS — E119 Type 2 diabetes mellitus without complications: Secondary | ICD-10-CM | POA: Diagnosis not present

## 2013-04-28 DIAGNOSIS — J189 Pneumonia, unspecified organism: Secondary | ICD-10-CM | POA: Diagnosis not present

## 2013-05-02 DIAGNOSIS — J441 Chronic obstructive pulmonary disease with (acute) exacerbation: Secondary | ICD-10-CM | POA: Diagnosis not present

## 2013-05-02 DIAGNOSIS — E119 Type 2 diabetes mellitus without complications: Secondary | ICD-10-CM | POA: Diagnosis not present

## 2013-05-02 DIAGNOSIS — I10 Essential (primary) hypertension: Secondary | ICD-10-CM | POA: Diagnosis not present

## 2013-05-02 DIAGNOSIS — J189 Pneumonia, unspecified organism: Secondary | ICD-10-CM | POA: Diagnosis not present

## 2013-05-03 DIAGNOSIS — J189 Pneumonia, unspecified organism: Secondary | ICD-10-CM | POA: Diagnosis not present

## 2013-05-03 DIAGNOSIS — E119 Type 2 diabetes mellitus without complications: Secondary | ICD-10-CM | POA: Diagnosis not present

## 2013-05-03 DIAGNOSIS — I10 Essential (primary) hypertension: Secondary | ICD-10-CM | POA: Diagnosis not present

## 2013-05-03 DIAGNOSIS — J441 Chronic obstructive pulmonary disease with (acute) exacerbation: Secondary | ICD-10-CM | POA: Diagnosis not present

## 2013-05-04 DIAGNOSIS — R5381 Other malaise: Secondary | ICD-10-CM | POA: Diagnosis not present

## 2013-05-04 DIAGNOSIS — G4710 Hypersomnia, unspecified: Secondary | ICD-10-CM | POA: Diagnosis not present

## 2013-05-04 DIAGNOSIS — J310 Chronic rhinitis: Secondary | ICD-10-CM | POA: Diagnosis not present

## 2013-05-04 DIAGNOSIS — Z23 Encounter for immunization: Secondary | ICD-10-CM | POA: Diagnosis not present

## 2013-05-04 DIAGNOSIS — J45909 Unspecified asthma, uncomplicated: Secondary | ICD-10-CM | POA: Diagnosis not present

## 2013-05-05 DIAGNOSIS — G4710 Hypersomnia, unspecified: Secondary | ICD-10-CM | POA: Diagnosis not present

## 2013-05-09 DIAGNOSIS — E559 Vitamin D deficiency, unspecified: Secondary | ICD-10-CM | POA: Diagnosis not present

## 2013-05-10 DIAGNOSIS — J441 Chronic obstructive pulmonary disease with (acute) exacerbation: Secondary | ICD-10-CM | POA: Diagnosis not present

## 2013-05-10 DIAGNOSIS — I10 Essential (primary) hypertension: Secondary | ICD-10-CM | POA: Diagnosis not present

## 2013-05-10 DIAGNOSIS — E119 Type 2 diabetes mellitus without complications: Secondary | ICD-10-CM | POA: Diagnosis not present

## 2013-05-10 DIAGNOSIS — J189 Pneumonia, unspecified organism: Secondary | ICD-10-CM | POA: Diagnosis not present

## 2013-05-12 DIAGNOSIS — J441 Chronic obstructive pulmonary disease with (acute) exacerbation: Secondary | ICD-10-CM | POA: Diagnosis not present

## 2013-05-12 DIAGNOSIS — J189 Pneumonia, unspecified organism: Secondary | ICD-10-CM | POA: Diagnosis not present

## 2013-05-12 DIAGNOSIS — I10 Essential (primary) hypertension: Secondary | ICD-10-CM | POA: Diagnosis not present

## 2013-05-12 DIAGNOSIS — E119 Type 2 diabetes mellitus without complications: Secondary | ICD-10-CM | POA: Diagnosis not present

## 2013-05-16 DIAGNOSIS — R0789 Other chest pain: Secondary | ICD-10-CM | POA: Diagnosis not present

## 2013-05-17 DIAGNOSIS — J189 Pneumonia, unspecified organism: Secondary | ICD-10-CM | POA: Diagnosis not present

## 2013-05-17 DIAGNOSIS — E119 Type 2 diabetes mellitus without complications: Secondary | ICD-10-CM | POA: Diagnosis not present

## 2013-05-17 DIAGNOSIS — I10 Essential (primary) hypertension: Secondary | ICD-10-CM | POA: Diagnosis not present

## 2013-05-17 DIAGNOSIS — J441 Chronic obstructive pulmonary disease with (acute) exacerbation: Secondary | ICD-10-CM | POA: Diagnosis not present

## 2013-05-19 ENCOUNTER — Institutional Professional Consult (permissible substitution): Payer: Medicare Other | Admitting: Pulmonary Disease

## 2013-05-19 DIAGNOSIS — I10 Essential (primary) hypertension: Secondary | ICD-10-CM | POA: Diagnosis not present

## 2013-05-19 DIAGNOSIS — E785 Hyperlipidemia, unspecified: Secondary | ICD-10-CM | POA: Diagnosis not present

## 2013-05-19 DIAGNOSIS — M940 Chondrocostal junction syndrome [Tietze]: Secondary | ICD-10-CM | POA: Diagnosis not present

## 2013-05-19 DIAGNOSIS — E119 Type 2 diabetes mellitus without complications: Secondary | ICD-10-CM | POA: Diagnosis not present

## 2013-05-19 DIAGNOSIS — J3801 Paralysis of vocal cords and larynx, unilateral: Secondary | ICD-10-CM | POA: Diagnosis not present

## 2013-05-19 DIAGNOSIS — J449 Chronic obstructive pulmonary disease, unspecified: Secondary | ICD-10-CM | POA: Diagnosis not present

## 2013-05-19 DIAGNOSIS — R079 Chest pain, unspecified: Secondary | ICD-10-CM | POA: Diagnosis not present

## 2013-05-19 DIAGNOSIS — R0602 Shortness of breath: Secondary | ICD-10-CM | POA: Diagnosis not present

## 2013-05-19 DIAGNOSIS — R160 Hepatomegaly, not elsewhere classified: Secondary | ICD-10-CM | POA: Diagnosis not present

## 2013-05-23 ENCOUNTER — Telehealth: Payer: Self-pay | Admitting: Internal Medicine

## 2013-05-23 ENCOUNTER — Encounter: Payer: Self-pay | Admitting: Internal Medicine

## 2013-05-23 ENCOUNTER — Ambulatory Visit (INDEPENDENT_AMBULATORY_CARE_PROVIDER_SITE_OTHER): Payer: Medicare Other | Admitting: Internal Medicine

## 2013-05-23 VITALS — BP 138/86 | HR 99 | Temp 98.9°F | Ht 64.0 in | Wt 223.0 lb

## 2013-05-23 DIAGNOSIS — R0609 Other forms of dyspnea: Secondary | ICD-10-CM

## 2013-05-23 DIAGNOSIS — J441 Chronic obstructive pulmonary disease with (acute) exacerbation: Secondary | ICD-10-CM | POA: Diagnosis not present

## 2013-05-23 DIAGNOSIS — I10 Essential (primary) hypertension: Secondary | ICD-10-CM

## 2013-05-23 DIAGNOSIS — R0600 Dyspnea, unspecified: Secondary | ICD-10-CM

## 2013-05-23 DIAGNOSIS — D869 Sarcoidosis, unspecified: Secondary | ICD-10-CM

## 2013-05-23 DIAGNOSIS — E119 Type 2 diabetes mellitus without complications: Secondary | ICD-10-CM | POA: Diagnosis not present

## 2013-05-23 DIAGNOSIS — J189 Pneumonia, unspecified organism: Secondary | ICD-10-CM | POA: Diagnosis not present

## 2013-05-23 MED ORDER — PREDNISONE (PAK) 10 MG PO TABS: ORAL_TABLET | ORAL | Status: DC

## 2013-05-23 MED ORDER — OLMESARTAN MEDOXOMIL-HCTZ 20-12.5 MG PO TABS: 1.0000 | ORAL_TABLET | Freq: Every day | ORAL | Status: DC

## 2013-05-23 NOTE — Patient Instructions (Addendum)
Stop advair and lisinopril  Start benicar 20/12.5 one daily x 4 weeks of samples and if better return to Dr Shelva Majestic, if not return here  Prednisone 10 mg take  4 each am x 2 days,   2 each am x 2 days,  1 each am x 2 days and stop  nexium 40 mg Take 30-60 min before first meal of the day   If short of breath ok to use your nebulizer and your cpap because the changes we make today will take several weeks to make full impact.   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

## 2013-05-23 NOTE — Telephone Encounter (Signed)
I called [facility] and spoke with the pharmacists. Pt was confused on what she was suppose to stop taking. I made her aware d/c the lisinopril and pt advair. She will do so that pt will not get refill on these medications. Nothing further needed

## 2013-05-23 NOTE — Progress Notes (Signed)
  Subjective:    Patient ID: Kristi Kramer, female    DOB: 1946/02/11  MRN: [id number]  HPI  72 yowf quit smoking 03/2013 with need for daily meds since at least around 2010 referred by Dr Shelva Majestic for refractory sob since Sept 2014  05/23/2013 1st [facility] office visit/ Kristi Kramer on ACEi cc continually  sob at rest except on cpap assoc with extremely abn phonation and dysphagia but no cough at all and no better on multiple dpis  And even nebs. Has sensation of throat congestion and feeling can't breath in causes chest to feel pressure on insp.    No obvious day to day or daytime variabilty or assoc   cp or chest tightness, subjective wheeze overt sinus or hb symptoms. No unusual exp hx or h/o childhood pna/ asthma or knowledge of premature birth.  Sleeping ok without nocturnal  or early am exacerbation  of respiratory  c/o's or need for noct saba. Also denies any obvious fluctuation of symptoms with weather or environmental changes or other aggravating or alleviating factors except as outlined above   Current Medications, Allergies, Complete Past Medical History, Past Surgical History, Family History, and Social History were reviewed in [employer] record.  ROS  The following are not active complaints unless bolded sore throat, dysphagia, dental problems, itching, sneezing,  nasal congestion or excess/ purulent secretions, ear ache,   fever, chills, sweats, unintended wt loss, pleuritic or exertional cp, hemoptysis,  orthopnea pnd or leg swelling, presyncope, palpitations, heartburn, abdominal pain, anorexia, nausea, vomiting, diarrhea  or change in bowel or urinary habits, change in stools or urine, dysuria,hematuria,  rash, arthralgias, visual complaints, headache, numbness weakness or ataxia or problems with walking or coordination,  change in mood/affect or memory.       Review of Systems  Constitutional: Negative for fever, chills and unexpected weight change.  HENT:  Positive for congestion and trouble swallowing. Negative for dental problem, ear pain, nosebleeds, postnasal drip, rhinorrhea, sinus pressure, sneezing, sore throat and voice change.   Eyes: Negative for visual disturbance.  Respiratory: Positive for shortness of breath. Negative for cough and choking.   Cardiovascular: Positive for chest pain and leg swelling.  Gastrointestinal: Negative for vomiting, abdominal pain and diarrhea.  Genitourinary: Negative for difficulty urinating.  Musculoskeletal: Negative for arthralgias.  Skin: Negative for rash.  Neurological: Negative for tremors, syncope and headaches.  Hematological: Does not bruise/bleed easily.       Objective:   Physical Exam  Very hoarse amb wf with classic pseudowheeze  Wt Readings from Last 3 Encounters:  05/23/13 223 lb (101.152 kg)  07/24/11 197 lb 8.5 oz (89.6 kg)      HEENT: nl dentition, turbinates, and orophanx. Nl external ear canals without cough reflex   NECK :  without JVD/Nodes/TM/ nl carotid upstrokes bilaterally   LUNGS: no acc muscle use, completely clear to A and P bilaterally without cough on insp or exp maneuvers   CV:  RRR  no s3 or murmur or increase in P2, no edema   ABD:  soft and nontender with nl excursion in the supine position. No bruits or organomegaly, bowel sounds nl  MS:  warm without deformities, calf tenderness, cyanosis or clubbing  SKIN: warm and dry without lesions    NEURO:  alert, approp, no deficits          Assessment & Plan:

## 2013-05-25 ENCOUNTER — Telehealth: Payer: Self-pay | Admitting: *Deleted

## 2013-05-25 DIAGNOSIS — M940 Chondrocostal junction syndrome [Tietze]: Secondary | ICD-10-CM | POA: Diagnosis not present

## 2013-05-25 DIAGNOSIS — J449 Chronic obstructive pulmonary disease, unspecified: Secondary | ICD-10-CM | POA: Diagnosis not present

## 2013-05-25 DIAGNOSIS — I10 Essential (primary) hypertension: Secondary | ICD-10-CM | POA: Diagnosis not present

## 2013-05-25 DIAGNOSIS — D869 Sarcoidosis, unspecified: Secondary | ICD-10-CM | POA: Diagnosis not present

## 2013-05-25 DIAGNOSIS — R0600 Dyspnea, unspecified: Secondary | ICD-10-CM | POA: Insufficient documentation

## 2013-05-25 DIAGNOSIS — IMO0001 Reserved for inherently not codable concepts without codable children: Secondary | ICD-10-CM | POA: Diagnosis not present

## 2013-05-25 NOTE — Telephone Encounter (Signed)
Message copied by Christen Butter on Wed May 25, 2013  2:34 PM ------      Message from: Sandrea Hughs B      Created: Wed May 25, 2013  1:29 PM       Needs ov in 2 weeks with all meds in hand to regroup  ------

## 2013-05-25 NOTE — Telephone Encounter (Signed)
Per MW can see TP in 2 wks- will need cxr  [facility]

## 2013-05-25 NOTE — Assessment & Plan Note (Addendum)
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

## 2013-05-25 NOTE — Assessment & Plan Note (Signed)
ACE inhibitors are problematic in  pts with airway complaints because  even experienced pulmonologists can't always distinguish ace effects from copd/asthma.  By themselves they don't actually cause a problem, much like oxygen can't by itself start a fire, but they certainly serve as a powerful catalyst or enhancer for any "fire"  or inflammatory process in the upper airway, be it caused by an ET  tube or more commonly reflux (especially in the obese or pts with known GERD or who are on biphoshonates).    In the era of ARB near equivalency until we have a better handle on the reversibility of the airway problem, it just makes sense to avoid ACEI  entirely in the short run - this is only way to sort out the diagnosis.

## 2013-05-25 NOTE — Assessment & Plan Note (Addendum)
Failure to improve off cigarettes with classic upper airway pseudowheeze pattern only improves on cpap  is classic for ACEi and DPI effects on the upper airway, not copd or asthma, though she could certainly have both.  In view of documented paralzyed R TVC don't want to do anything that irritates the upper airway in any way  rec trial off acei, on max gerd rx, off all inhalers x prn alb for now then regroup in 2 weeks if not better   See instructions for specific recommendations which were reviewed directly with the patient who was given a copy with highlighter outlining the key components.

## 2013-05-25 NOTE — Telephone Encounter (Signed)
Pt returned Leslie's call.  Leslie's line was busy, so I set an appt w/ TP on 06/09/13 @ 9:00 am for a med calendar.  I went over the Med Rev sheet step by step w/ pt.  Pt verbalized understanding. Also advised that she will be getting a CXR as well.  Pt verbalized understanding.  Antionette Fairy

## 2013-06-01 ENCOUNTER — Encounter (HOSPITAL_COMMUNITY): Payer: Self-pay | Admitting: Emergency Medicine

## 2013-06-01 ENCOUNTER — Emergency Department (HOSPITAL_COMMUNITY)
Admission: EM | Admit: 2013-06-01 | Discharge: 2013-06-02 | Disposition: A | Discharge status: Home / Self Care | Payer: Medicare Other | Attending: Emergency Medicine | Admitting: Emergency Medicine

## 2013-06-01 ENCOUNTER — Emergency Department (HOSPITAL_COMMUNITY): Payer: Medicare Other

## 2013-06-01 DIAGNOSIS — Z87891 Personal history of nicotine dependence: Secondary | ICD-10-CM | POA: Insufficient documentation

## 2013-06-01 DIAGNOSIS — E119 Type 2 diabetes mellitus without complications: Secondary | ICD-10-CM | POA: Insufficient documentation

## 2013-06-01 DIAGNOSIS — Z8673 Personal history of transient ischemic attack (TIA), and cerebral infarction without residual deficits: Secondary | ICD-10-CM | POA: Insufficient documentation

## 2013-06-01 DIAGNOSIS — J441 Chronic obstructive pulmonary disease with (acute) exacerbation: Secondary | ICD-10-CM | POA: Diagnosis not present

## 2013-06-01 DIAGNOSIS — F411 Generalized anxiety disorder: Secondary | ICD-10-CM | POA: Insufficient documentation

## 2013-06-01 DIAGNOSIS — Z79899 Other long term (current) drug therapy: Secondary | ICD-10-CM | POA: Diagnosis not present

## 2013-06-01 DIAGNOSIS — I10 Essential (primary) hypertension: Secondary | ICD-10-CM | POA: Diagnosis not present

## 2013-06-01 DIAGNOSIS — Z7982 Long term (current) use of aspirin: Secondary | ICD-10-CM | POA: Insufficient documentation

## 2013-06-01 DIAGNOSIS — R0789 Other chest pain: Secondary | ICD-10-CM | POA: Diagnosis not present

## 2013-06-01 DIAGNOSIS — Z862 Personal history of diseases of the blood and blood-forming organs and certain disorders involving the immune mechanism: Secondary | ICD-10-CM | POA: Insufficient documentation

## 2013-06-01 DIAGNOSIS — R079 Chest pain, unspecified: Secondary | ICD-10-CM | POA: Diagnosis not present

## 2013-06-01 DIAGNOSIS — Z8639 Personal history of other endocrine, nutritional and metabolic disease: Secondary | ICD-10-CM | POA: Insufficient documentation

## 2013-06-01 LAB — CBC WITH DIFFERENTIAL/PLATELET
Basophils Absolute: 0 10*3/uL (ref 0.0–0.1)
Basophils Relative: 0 % (ref 0–1)
Eosinophils Absolute: 0.1 10*3/uL (ref 0.0–0.7)
Eosinophils Relative: 2 % (ref 0–5)
HCT: 37.1 % (ref 36.0–46.0)
Lymphocytes Relative: 17 % (ref 12–46)
Lymphs Abs: 1.3 10*3/uL (ref 0.7–4.0)
MCHC: 35 g/dL (ref 30.0–36.0)
MCV: 90 fL (ref 78.0–100.0)
Monocytes Absolute: 0.7 10*3/uL (ref 0.1–1.0)
Neutro Abs: 5.6 10*3/uL (ref 1.7–7.7)
Neutrophils Relative %: 73 % (ref 43–77)
Platelets: 219 10*3/uL (ref 150–400)
RBC: 4.12 MIL/uL (ref 3.87–5.11)
RDW: 12.9 % (ref 11.5–15.5)
WBC: 7.7 10*3/uL (ref 4.0–10.5)

## 2013-06-01 LAB — BASIC METABOLIC PANEL WITH GFR
BUN: 20 mg/dL (ref 6–23)
CO2: 26 meq/L (ref 19–32)
Calcium: 9.6 mg/dL (ref 8.4–10.5)
Chloride: 97 meq/L (ref 96–112)
Creatinine, Ser: 0.7 mg/dL (ref 0.50–1.10)
GFR calc Af Amer: >90 mL/min (ref >90)
GFR calc non Af Amer: 89 mL/min — ABNORMAL LOW (ref >90)
Glucose, Bld: 167 mg/dL — ABNORMAL HIGH (ref 70–99)
Potassium: 3.6 meq/L (ref 3.5–5.1)
Sodium: 134 meq/L — ABNORMAL LOW (ref 135–145)

## 2013-06-01 LAB — PRO B NATRIURETIC PEPTIDE: Pro B Natriuretic peptide (BNP): 37.1 pg/mL (ref 0–125)

## 2013-06-01 LAB — TROPONIN I: Troponin I: <0.3 ng/mL (ref <0.30)

## 2013-06-01 MED ORDER — MORPHINE SULFATE 4 MG/ML IJ SOLN
4.0000 mg | Freq: Once | INTRAMUSCULAR | Status: AC
  Administered 2013-06-01: 4 mg via INTRAVENOUS
  Filled 2013-06-01: qty 1

## 2013-06-01 MED ORDER — NITROGLYCERIN 0.4 MG SL SUBL
0.4000 mg | SUBLINGUAL_TABLET | SUBLINGUAL | Status: DC | PRN
  Administered 2013-06-01 (×2): 0.4 mg via SUBLINGUAL

## 2013-06-01 NOTE — ED Notes (Signed)
Harrison, MD at bedside. 

## 2013-06-01 NOTE — ED Notes (Signed)
Allyne Gee PA, informed that the pt is asking for more pain medication.

## 2013-06-01 NOTE — ED Notes (Signed)
Per EMS pt from home sts appx 6pm pt had central CP 10/10 radiating to back- no associated symptoms. Denies SOB, n/v. Pt no cardiac history. EMS GAVE 324 ASA and 1 nitro with no relief. Pt alert and oriented & tearful.   Hx of COPD, gallbladder removed, TIAs, polyps in lungs, enlarged liver, cardiomegaly,  HTN, DM type 2, hypercholesteremia.

## 2013-06-01 NOTE — ED Provider Notes (Signed)
CSN: [id number]     Arrival date & time 06/01/13  2051 History   First MD Initiated Contact with Patient 06/01/13 2052     Chief Complaint  Patient presents with  . Chest Pain   (Consider location/radiation/quality/duration/timing/severity/associated sxs/prior Treatment) The history is provided by the patient and medical records.   This is a 67 year old female with past medical history significant for diabetes, hypertension, hyperlipidemia, COPD, stroke, presenting to the ED for central chest pressure with radiation to her back and left shoulder. States she has some associated shortness of breath and palpitations. She denies any diaphoresis, nausea, or vomiting. States symptoms have been present for the past several weeks, but have worsened over the past few days. Patient was given full dose aspirin and 1 nitroglycerin en route to the ED without noted improvement.  Patient states she does have a prior cardiac history, she follows with a cardiologist in [place] who is aware of her current sx.  She is scheduled for an echo in 2 weeks for further evaluation.  No recent travel, surgeries, or prolonged periods of immobilization. No prior history of DVT or PE.  Pt arrived on 3L O2 via [place], states she is not usually on supplemental O2.  Past Medical History  Diagnosis Date  . Diabetes mellitus   . Hypertension   . High cholesterol   . COPD (chronic obstructive pulmonary disease)   . Stroke    Past Surgical History  Procedure Laterality Date  . Neck surgery    . Back surgery    . Cholecystectomy     Family History  Problem Relation Age of Onset  . Liver cancer Father    History  Substance Use Topics  . Smoking status: Former Smoker -- 0.50 packs/day for 22 years    Types: Cigarettes    Quit date: 04/17/2013  . Smokeless tobacco: Never Used  . Alcohol Use: No   OB History   Grav Para Term Preterm Abortions TAB SAB Ect Mult Living                 Review of Systems  Respiratory:  Positive for shortness of breath.   Cardiovascular: Positive for chest pain.  All other systems reviewed and are negative.    Allergies  Codeine; Mometasone furoate; and Sulfa antibiotics  Home Medications   Current Outpatient Rx  Name  Route  Sig  Dispense  Refill  . albuterol (PROVENTIL HFA;VENTOLIN HFA) 108 (90 BASE) MCG/ACT inhaler   Inhalation   Inhale 2 puffs into the lungs every 6 (six) hours as needed. For shortness of breath.          Marland Kitchen albuterol (PROVENTIL) (2.5 MG/3ML) 0.083% nebulizer solution   Nebulization   Take 2.5 mg by nebulization 3 (three) times daily as needed for wheezing or shortness of breath.         . ALPRAZolam (XANAX) 0.5 MG tablet   Oral   Take 0.5-1 mg by mouth daily as needed. For anxiety.          Marland Kitchen aspirin 81 MG chewable tablet   Oral   Chew 81 mg by mouth daily.         . AZELASTINE HCL NA   Nasal   Place 1 spray into the nose daily as needed.         . citalopram (CELEXA) 20 MG tablet   Oral   Take 20 mg by mouth daily.         Marland Kitchen esomeprazole (  NEXIUM) 40 MG capsule   Oral   Take 40 mg by mouth 2 (two) times daily.           . fenofibrate (TRICOR) 145 MG tablet   Oral   Take 145 mg by mouth daily.           Marland Kitchen glipiZIDE (GLUCOTROL) 10 MG tablet   Oral   Take 10 mg by mouth daily.         Marland Kitchen loratadine (CLARITIN) 10 MG tablet   Oral   Take 10 mg by mouth daily.         . metFORMIN (GLUCOPHAGE-XR) 500 MG 24 hr tablet   Oral   Take 1,000 mg by mouth daily.           . metoprolol (TOPROL-XL) 50 MG 24 hr tablet   Oral   Take 50 mg by mouth daily.           Marland Kitchen olmesartan-hydrochlorothiazide (BENICAR HCT) 20-12.5 MG per tablet   Oral   Take 1 tablet by mouth daily.         . predniSONE (STERAPRED UNI-PAK) 10 MG tablet      Prednisone 10 mg take  4 each am x 2 days,   2 each am x 2 days,  1 each am x2days and stop   14 tablet   0   . tiZANidine (ZANAFLEX) 4 MG tablet   Oral   Take 4 mg by mouth 3  (three) times daily as needed.         . Vitamin D, Ergocalciferol, (DRISDOL) 50000 UNITS CAPS capsule   Oral   Take 50,000 Units by mouth every 7 (seven) days.          BP 114/66  Pulse 77  Temp(Src) 97.5 F (36.4 C) (Oral)  Resp 15  SpO2 96%  Physical Exam  Nursing note and vitals reviewed. Constitutional: She is oriented to person, place, and time. She appears well-developed and well-nourished. No distress.  HENT:  Head: Normocephalic and atraumatic.  Mouth/Throat: Oropharynx is clear and moist.  Eyes: Conjunctivae and EOM are normal. Pupils are equal, round, and reactive to light.  Neck: Normal range of motion. Neck supple.  Cardiovascular: Normal rate, regular rhythm and normal heart sounds.   intact pulses in all 4 extremities  Pulmonary/Chest: Effort normal and breath sounds normal. No respiratory distress. She has no wheezes.  Lungs CTAB; speaking in short truncated sentences  Abdominal: Soft. Bowel sounds are normal. There is no tenderness. There is no guarding.  Musculoskeletal: Normal range of motion. She exhibits no edema.  Neurological: She is alert and oriented to person, place, and time. She has normal strength. She displays no tremor. No cranial nerve deficit or sensory deficit. She displays no seizure activity.  CN grossly intact, moves all extremities appropriately without ataxia, no focal neuro deficits or facial droop appreciated  Skin: Skin is warm. She is not diaphoretic.  Psychiatric: Her mood appears anxious.  Appears somewhat anxious    ED Course  Procedures (including critical care time)  Labs Review Labs Reviewed  BASIC METABOLIC PANEL - Abnormal; Notable for the following:    Sodium 134 (*)    Glucose, Bld 167 (*)    GFR calc non Af Amer 89 (*)    All other components within normal limits  CBC WITH DIFFERENTIAL  PRO B NATRIURETIC PEPTIDE  TROPONIN I  TROPONIN I   Imaging Review Dg Chest 2 View  06/01/2013  CLINICAL DATA:  Chest  pain  EXAM: CHEST  2 VIEW  COMPARISON:  06/02/2012  FINDINGS: The cardiac shadow is within normal limits. The lungs are well aerated bilaterally. No focal infiltrate or sizable effusion is seen. No acute bony abnormality is noted.  IMPRESSION: No acute abnormality seen.   Electronically Signed   By: Alcide Clever M.D.   On: 06/01/2013 22:06    EKG Interpretation     Ventricular Rate:  78 PR Interval:  171 QRS Duration: 89 QT Interval:  381 QTC Calculation: 434 R Axis:   72 Text Interpretation:  Sinus rhythm No significant change since last tracing            MDM   1. Chest pain    EKG NSR, no acute ischemic changes.  Trop negative.  CXR clear.  Labs as above, no significant abnormalities.  Pt is low risk for PE at this time.  Pt was removed from RA, O2 sats remained stable.  Pt has been given morphine and 3 SL NTG with only minimal improvement of her pain, states now rated 7/10 as opposed to 10/10 on arrival.  BP did slightly drop after SL NTG, IVF bolus initiated.  During re-evaluation, pt in room speaking with her husband in full complete sentences but when i address her, she begins panting and speaking in short sentences again.  Pt requested O2 to be replaced, however she was satting well on RA.  Due to pts continued active chest pain and atypical sx, consulted on call cardiology-- spoke with cardiology fellow, Dr. Adolm Joseph who states he feels hospitalist can handle management but call back if anything acute occurs.  Discussed with hospitalist, Dr. Julian Reil-- due to persistent sx for 3 weeks, recommended delta trop and d/c if negative.    Care signed out to PA [facility] at shift change.  If delta trop negative, pt is to be discharged and FU with her personal cardiologist.  Garlon Hatchet, PA-C 06/02/13 [phone]

## 2013-06-02 DIAGNOSIS — E119 Type 2 diabetes mellitus without complications: Secondary | ICD-10-CM | POA: Diagnosis not present

## 2013-06-02 DIAGNOSIS — J189 Pneumonia, unspecified organism: Secondary | ICD-10-CM | POA: Diagnosis not present

## 2013-06-02 DIAGNOSIS — I10 Essential (primary) hypertension: Secondary | ICD-10-CM | POA: Diagnosis not present

## 2013-06-02 DIAGNOSIS — J441 Chronic obstructive pulmonary disease with (acute) exacerbation: Secondary | ICD-10-CM | POA: Diagnosis not present

## 2013-06-02 LAB — TROPONIN I: Troponin I: <0.3 ng/mL (ref <0.30)

## 2013-06-02 MED ORDER — HYDROMORPHONE HCL PF 1 MG/ML IJ SOLN
0.5000 mg | Freq: Once | INTRAMUSCULAR | Status: AC
  Administered 2013-06-02: 0.5 mg via INTRAVENOUS
  Filled 2013-06-02: qty 1

## 2013-06-02 NOTE — ED Notes (Signed)
After the second nitro, the pt's blood pressure dropped to 82/42. This RN started a bolus and a tech is checking a manual blood pressure.

## 2013-06-02 NOTE — ED Provider Notes (Signed)
Patient care assumed from Lanae Crumbly, PA-C at shift change with second troponin pending. Per Rickey Barbara, patient case discussed with Dr. Julian Reil and cardiologist on call, both of whom do not believe further work up or admission indicated if delta troponin negative. Will continue to monitor.  Patient's second troponin negative. She is stable and appropriate for d/c with instruction for cardiology follow up. Return precautions provided.   Filed Vitals:   06/02/13 0009 06/02/13 0100 06/02/13 0145 06/02/13 0227  BP: 92/56 102/45 102/54 113/66  Pulse:  59 59   Temp:      TempSrc:      Resp:  15 16 14   SpO2:  99% 95% 96%    Results for orders placed during the hospital encounter of 06/01/13  CBC WITH DIFFERENTIAL      Result Value Range   WBC 7.7  4.0 - 10.5 K/uL   RBC 4.12  3.87 - 5.11 MIL/uL   Hemoglobin 13.0  12.0 - 15.0 g/dL   HCT 16.1  09.6 - 04.5 %   MCV 90.0  78.0 - 100.0 fL   MCH 31.6  26.0 - 34.0 pg   MCHC 35.0  30.0 - 36.0 g/dL   RDW 40.9  81.1 - 91.4 %   Platelets 219  150 - 400 K/uL   Neutrophils Relative % 73  43 - 77 %   Neutro Abs 5.6  1.7 - 7.7 K/uL   Lymphocytes Relative 17  12 - 46 %   Lymphs Abs 1.3  0.7 - 4.0 K/uL   Monocytes Relative 9  3 - 12 %   Monocytes Absolute 0.7  0.1 - 1.0 K/uL   Eosinophils Relative 2  0 - 5 %   Eosinophils Absolute 0.1  0.0 - 0.7 K/uL   Basophils Relative 0  0 - 1 %   Basophils Absolute 0.0  0.0 - 0.1 K/uL  BASIC METABOLIC PANEL      Result Value Range   Sodium 134 (*) 135 - 145 mEq/L   Potassium 3.6  3.5 - 5.1 mEq/L   Chloride 97  96 - 112 mEq/L   CO2 26  19 - 32 mEq/L   Glucose, Bld 167 (*) 70 - 99 mg/dL   BUN 20  6 - 23 mg/dL   Creatinine, Ser 7.82  0.50 - 1.10 mg/dL   Calcium 9.6  8.4 - 95.6 mg/dL   GFR calc non Af Amer 89 (*) >90 mL/min   GFR calc Af Amer >90  >90 mL/min  PRO B NATRIURETIC PEPTIDE      Result Value Range   Pro B Natriuretic peptide (BNP) 37.1  0 - 125 pg/mL  TROPONIN I      Result Value Range   Troponin I <0.30  <0.30 ng/mL  TROPONIN I      Result Value Range   Troponin I <0.30  <0.30 ng/mL   Dg Chest 2 View  06/01/2013   CLINICAL DATA:  Chest pain  EXAM: CHEST  2 VIEW  COMPARISON:  06/02/2012  FINDINGS: The cardiac shadow is within normal limits. The lungs are well aerated bilaterally. No focal infiltrate or sizable effusion is seen. No acute bony abnormality is noted.  IMPRESSION: No acute abnormality seen.   Electronically Signed   By: Alcide Clever M.D.   On: 06/01/2013 22:06      Antony Madura, PA-C 06/02/13 2130

## 2013-06-02 NOTE — ED Notes (Signed)
This RN informed Sanders, PA, of the pt's diastolic BP, and told this RN to hold the dilaudid until the pt's blood pressure increased.

## 2013-06-02 NOTE — ED Provider Notes (Signed)
Medical screening examination/treatment/procedure(s) were conducted as a shared visit with non-physician practitioner(s) and myself.  I personally evaluated the patient during the encounter.  EKG Interpretation     Ventricular Rate:  78 PR Interval:  171 QRS Duration: 89 QT Interval:  381 QTC Calculation: 434 R Axis:   72 Text Interpretation:  Sinus rhythm No significant change since last tracing            I interviewed and examined the patient. Lungs are CTAB. Cardiac exam wnl. Abdomen soft.  Pt speaking in short phrases, states she has been speaking like this for weeks. Does not appear to have inc wob, not hypoxic on my exam. She has been evaluated by her cardiologist for this cp and has an echo scheduled. Plan and dispo as referenced in PA's noted.    Junius Argyle, MD 06/02/13 [phone]

## 2013-06-02 NOTE — ED Provider Notes (Signed)
Medical screening examination/treatment/procedure(s) were performed by non-physician practitioner and as supervising physician I was immediately available for consultation/collaboration.  EKG Interpretation     Ventricular Rate:  78 PR Interval:  171 QRS Duration: 89 QT Interval:  381 QTC Calculation: 434 R Axis:   72 Text Interpretation:  Sinus rhythm No significant change since last tracing             Sunnie Nielsen, MD 06/02/13 [phone]

## 2013-06-08 DIAGNOSIS — R0602 Shortness of breath: Secondary | ICD-10-CM | POA: Diagnosis not present

## 2013-06-09 ENCOUNTER — Inpatient Hospital Stay
Admission: RE | Admit: 2013-06-09 | Discharge: 2013-06-09 | Disposition: A | Discharge status: Home / Self Care | Payer: Self-pay | Source: Ambulatory Visit | Attending: Adult Health | Admitting: Adult Health

## 2013-06-09 ENCOUNTER — Ambulatory Visit (INDEPENDENT_AMBULATORY_CARE_PROVIDER_SITE_OTHER)
Admission: RE | Admit: 2013-06-09 | Discharge: 2013-06-09 | Disposition: A | Discharge status: Home / Self Care | Payer: Medicare Other | Source: Ambulatory Visit | Attending: Adult Health | Admitting: Adult Health

## 2013-06-09 ENCOUNTER — Ambulatory Visit (INDEPENDENT_AMBULATORY_CARE_PROVIDER_SITE_OTHER): Payer: Medicare Other | Admitting: Adult Health

## 2013-06-09 ENCOUNTER — Encounter: Payer: Self-pay | Admitting: Adult Health

## 2013-06-09 VITALS — BP 124/74 | HR 91 | Temp 98.7°F | Ht 64.0 in | Wt 223.8 lb

## 2013-06-09 DIAGNOSIS — E119 Type 2 diabetes mellitus without complications: Secondary | ICD-10-CM | POA: Diagnosis not present

## 2013-06-09 DIAGNOSIS — I10 Essential (primary) hypertension: Secondary | ICD-10-CM | POA: Diagnosis not present

## 2013-06-09 DIAGNOSIS — R0600 Dyspnea, unspecified: Secondary | ICD-10-CM

## 2013-06-09 DIAGNOSIS — R0609 Other forms of dyspnea: Secondary | ICD-10-CM

## 2013-06-09 DIAGNOSIS — J441 Chronic obstructive pulmonary disease with (acute) exacerbation: Secondary | ICD-10-CM | POA: Diagnosis not present

## 2013-06-09 DIAGNOSIS — J449 Chronic obstructive pulmonary disease, unspecified: Secondary | ICD-10-CM

## 2013-06-09 DIAGNOSIS — J189 Pneumonia, unspecified organism: Secondary | ICD-10-CM | POA: Diagnosis not present

## 2013-06-09 DIAGNOSIS — J9819 Other pulmonary collapse: Secondary | ICD-10-CM | POA: Diagnosis not present

## 2013-06-09 NOTE — Patient Instructions (Signed)
Continue on current regimen.  We are setting you up for a CT chest and neck  Follow up Dr. Sherene Sires  In 1-2 weeks and As needed   Please contact office for sooner follow up if symptoms do not improve or worsen or seek emergency care

## 2013-06-09 NOTE — Addendum Note (Signed)
Addended by: Boone Master E on: 06/09/2013 09:52 AM   Modules accepted: Orders

## 2013-06-09 NOTE — Addendum Note (Signed)
Addended by: Boone Master E on: 06/09/2013 04:25 PM   Modules accepted: Orders

## 2013-06-09 NOTE — Assessment & Plan Note (Signed)
Dyspnea ? 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  Set up for CT chest and neck.  Remain off ACE and Advair which can further irritate UA Sats are adequate , CXR with no acute finding.  Recent ER workup with neg card enzymes and bnp.  Cardiology evaluation ongoing with echo done yesterday-no results available.  Exam with no evidence of neuromuscular abn.  Return in 1-2 weeks with Dr. Sherene Sires   Please contact office for sooner follow up if symptoms do not improve or worsen or seek emergency care

## 2013-06-09 NOTE — Assessment & Plan Note (Signed)
No flare off Advair  Will need PFT in future .  Congratulated on smoking cessation

## 2013-06-09 NOTE — Addendum Note (Signed)
Addended by: Boone Master E on: 06/09/2013 01:04 PM   Modules accepted: Orders

## 2013-06-09 NOTE — Progress Notes (Signed)
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 Has sensation of throat congestion and feeling can't breath in causes chest to feel pressure on insp.  >>REC :  Stop advair and lisinopril Start benicar 20/12.5 Prednisone taper  nexium 40 mg daily    06/09/2013 Follow up and Med review  Returns for follow up  and med review.  Since last ov feel that his dyspnea is essentially unchanged but worse some days.  went to ED 11/12 for CP; had 2D Cardiac enzymes were neg. BNP nml.  Referred to OP cardiology  Echo yesterday w/ cards in [place] Unfortunately, did not bring meds with her today.  CXR today with atx in left base. No acute process noted.  Last ov seen for pulmonary consult , recommended to stop ACE and Advair , begin PPI , given steroid taper.   Complains that since 04/17/13 , has dyspnea,  Hoarseness, difficulty talking with broken speech.  Wearing CPAP helps with dyspnea.  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  Talking makes chest pain and dyspnea worse.  No hemoptysis , chest pain, orthopnea, edema , n/v,  Muscle weakness, speech or vision changes.   Current Medications, Allergies, Complete Past Medical History, Past Surgical History, Family History, and Social History were reviewed in [employer] record.   Review of Systems  Constitutional: Negative for fever, chills and unexpected weight change.  HENT: Positive for congestion and trouble swallowing. Negative for dental problem, ear pain, nosebleeds, postnasal  drip, rhinorrhea, sinus pressure, sneezing, sore throat and voice change.   Eyes: Negative for visual disturbance.  Respiratory: Positive for shortness of breath. Negative for cough and choking.   Cardiovascular: Positive for chest pain and leg swelling.  Gastrointestinal: Negative for vomiting, abdominal pain and diarrhea. +epigastric pain.  Genitourinary: Negative for difficulty urinating.  Musculoskeletal: Negative for arthralgias.  Skin: Negative for rash.  Neurological: Negative for tremors, syncope and headaches. no facial droop . No muscle weakness.  Hematological: Does not bruise/bleed easily.       Objective:   Physical Exam  Very hoarse amb wf with classic pseudowheeze  spastic dysphonia      HEENT: nl dentition, turbinates, and orophanx. Nl external ear canals without cough reflex   NECK :  without JVD/Nodes/TM/ nl carotid upstrokes bilaterally   LUNGS: no acc muscle use, completely clear to A and P bilaterally without cough on insp or exp maneuvers   CV:  RRR  no s3 or murmur or increase in P2, no edema   ABD:  soft and nontender with nl excursion in the supine position. No bruits or organomegaly, bowel sounds nl  MS:  warm without deformities, calf tenderness, cyanosis or clubbing, Equal strength of UE /LE , nml gait  SKIN: warm and dry without lesions    NEURO:  alert, approp, no deficits No tremor noted, CN 2-12 intact.     CXR 06/09/2013 Left basilar atx.        Assessment & Plan:

## 2013-06-10 ENCOUNTER — Ambulatory Visit (INDEPENDENT_AMBULATORY_CARE_PROVIDER_SITE_OTHER)
Admission: RE | Admit: 2013-06-10 | Discharge: 2013-06-10 | Disposition: A | Discharge status: Home / Self Care | Payer: Medicare Other | Source: Ambulatory Visit | Attending: Adult Health | Admitting: Adult Health

## 2013-06-10 DIAGNOSIS — R0989 Other specified symptoms and signs involving the circulatory and respiratory systems: Secondary | ICD-10-CM

## 2013-06-10 DIAGNOSIS — R0609 Other forms of dyspnea: Secondary | ICD-10-CM

## 2013-06-10 DIAGNOSIS — J3800 Paralysis of vocal cords and larynx, unspecified: Secondary | ICD-10-CM | POA: Diagnosis not present

## 2013-06-10 DIAGNOSIS — R0600 Dyspnea, unspecified: Secondary | ICD-10-CM

## 2013-06-10 DIAGNOSIS — J449 Chronic obstructive pulmonary disease, unspecified: Secondary | ICD-10-CM | POA: Diagnosis not present

## 2013-06-10 MED ORDER — IOHEXOL 300 MG/ML  SOLN
80.0000 mL | Freq: Once | INTRAMUSCULAR | Status: AC | PRN
  Administered 2013-06-10: 80 mL via INTRAVENOUS

## 2013-06-13 ENCOUNTER — Encounter: Payer: Self-pay | Admitting: Adult Health

## 2013-06-13 ENCOUNTER — Telehealth: Payer: Self-pay | Admitting: Internal Medicine

## 2013-06-13 NOTE — Telephone Encounter (Signed)
Notes Recorded by Julio Sicks, NP on 06/13/2013 at 9:23 AM CT chest is ok, nothing to explain Dyspnea or voice changes  follow up as planned and As needed  Please contact office for sooner follow up if symptoms do not improve or worsen or seek emergency care       I spoke with patient about results and she verbalized understanding and had no questions

## 2013-06-23 ENCOUNTER — Telehealth: Payer: Self-pay | Admitting: Internal Medicine

## 2013-06-23 MED ORDER — OLMESARTAN MEDOXOMIL-HCTZ 20-12.5 MG PO TABS: 1.0000 | ORAL_TABLET | Freq: Every day | ORAL | Status: DC

## 2013-06-23 NOTE — Telephone Encounter (Signed)
Patient returning call.

## 2013-06-23 NOTE — Telephone Encounter (Signed)
Pt aware rx has been sent. Nothing further needed 

## 2013-06-24 ENCOUNTER — Ambulatory Visit (INDEPENDENT_AMBULATORY_CARE_PROVIDER_SITE_OTHER): Payer: Medicare Other | Admitting: Internal Medicine

## 2013-06-24 ENCOUNTER — Encounter: Payer: Self-pay | Admitting: Internal Medicine

## 2013-06-24 VITALS — BP 130/84 | HR 100 | Temp 97.9°F | Ht 65.0 in | Wt 226.0 lb

## 2013-06-24 DIAGNOSIS — R0609 Other forms of dyspnea: Secondary | ICD-10-CM | POA: Diagnosis not present

## 2013-06-24 DIAGNOSIS — J383 Other diseases of vocal cords: Secondary | ICD-10-CM | POA: Diagnosis not present

## 2013-06-24 DIAGNOSIS — R0600 Dyspnea, unspecified: Secondary | ICD-10-CM

## 2013-06-24 DIAGNOSIS — I10 Essential (primary) hypertension: Secondary | ICD-10-CM

## 2013-06-24 DIAGNOSIS — R599 Enlarged lymph nodes, unspecified: Secondary | ICD-10-CM | POA: Diagnosis not present

## 2013-06-24 DIAGNOSIS — R590 Localized enlarged lymph nodes: Secondary | ICD-10-CM

## 2013-06-24 MED ORDER — PREDNISONE (PAK) 10 MG PO TABS: ORAL_TABLET | ORAL | Status: DC

## 2013-06-24 NOTE — Patient Instructions (Addendum)
Prednisone 10 mg take  4 each am x 2 days,   2 each am x 2 days,  1 each am x 2 days and stop   Please see patient coordinator before you leave today  to schedule referral to the voice center at [facility]

## 2013-06-24 NOTE — Progress Notes (Signed)
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 Has sensation of throat congestion and feeling can't breath in causes chest to feel pressure on insp.  >>REC :  Stop advair and lisinopril Start benicar 20/12.5 Prednisone taper  nexium 40 mg daily    06/09/2013 Follow up and Med review  Returns for follow up  and med review.  Since last ov feel that his dyspnea is essentially unchanged but worse some days.  went to ED 11/12 for CP; had 2D Cardiac enzymes were neg. BNP nml.  Referred to OP cardiology  Echo yesterday w/ cards in [place] Unfortunately, did not bring meds with her today.  CXR today with atx in left base. No acute process noted.  Last ov seen for pulmonary consult , recommended to stop ACE and Advair , begin PPI , given steroid taper.   Complains that since 04/17/13 , has dyspnea,  Hoarseness, difficulty talking with broken speech.  Wearing CPAP helps with dyspnea.  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  Talking makes chest pain and dyspnea worse.  No hemoptysis , chest pain, orthopnea, edema , n/v,  Muscle weakness, speech or vision changes.  rec No change rx   06/24/2013 f/u ov/Wert re: sob/vcd Chief Complaint  Patient presents with  . Follow-up    SOB increased x2 weeks, feeling asthough throat become tight   still not able to confirm all meds but assures me she stopped advair and lisinopril  No change hoarsenss, min dry cough. Only comfortable when cpap is in place/ no  change with saba. Has not tried prednisone off acei yet.   No obvious day to day or daytime variabilty or assoc excess or purulent sputum or cp or chest tightness, subjective wheeze overt sinus or hb symptoms. No unusual exp hx or h/o childhood pna/ asthma or knowledge of premature birth.  Sleeping ok on cpap without nocturnal  or early am exacerbation  of respiratory  c/o's or need for noct saba. Also denies any obvious fluctuation of symptoms with weather or environmental changes or other aggravating or alleviating factors except as outlined above   Current Medications, Allergies, Complete Past Medical History, Past Surgical History, Family History, and Social History were reviewed in [employer] record.  ROS  The following are not active complaints unless bolded sore throat, dysphagia, dental problems, itching, sneezing,  nasal congestion or excess/ purulent secretions, ear ache,   fever, chills, sweats, unintended wt loss, pleuritic or exertional cp, hemoptysis,  orthopnea pnd or leg swelling, presyncope, palpitations, heartburn, abdominal pain, anorexia, nausea, vomiting, diarrhea  or change in bowel or urinary habits, change in stools or urine, dysuria,hematuria,  rash, arthralgias, visual complaints, headache, numbness weakness or ataxia or problems with walking or coordination,  change in mood/affect or memory.                 Objective:   Physical Exam  Very hoarse amb wf  with classic pseudowheeze   spastic dysphonia    Wt Readings from Last 3 Encounters:  06/24/13 226 lb (102.513 kg)  06/09/13 223 lb 12.8 oz (101.515 kg)  05/23/13 223 lb (101.152 kg)         HEENT: nl dentition, turbinates, and orophanx. Nl external ear canals without cough reflex   NECK :  without JVD/Nodes/TM/ nl carotid upstrokes bilaterally   LUNGS: no acc muscle use, completely clear to A and P bilaterally without cough on insp or exp maneuvers   CV:  RRR  no s3 or  murmur or increase in P2, no edema   ABD:  soft and nontender with nl excursion in the supine position. No bruits or organomegaly, bowel sounds nl  MS:  warm without deformities, calf tenderness, cyanosis or clubbing, Equal strength of UE /LE , nml gait  SKIN: warm and dry without lesions    NEURO:  alert, approp, no deficits No tremor noted, CN 2-12 intact.     CTa 06/10/13 Enlarged right hilar lymph nodes. The largest has a short axis  diameter of 1.5 cm on image number 23. No enlarged mediastinal or  left hilar nodes. Calcified granuloma at the left lung base.  Otherwise, no lung nodules. Diffuse low density of the liver  relative to the spleen. Thoracic spine degenerative changes.  Cervical spine fixation hardware.   pfts nl 06/24/2013        Assessment & Plan:   Outpatient Encounter Prescriptions as of 06/24/2013  Medication Sig  . albuterol (PROVENTIL) (2.5 MG/3ML) 0.083% nebulizer solution Take 2.5 mg by nebulization 3 (three) times daily as needed for wheezing or shortness of breath.  . ALPRAZolam (XANAX) 0.5 MG tablet Take 0.5-1 mg by mouth daily as needed. For anxiety.  Marland Kitchen aspirin EC 81 MG tablet Take 81 mg by mouth daily.  Marland Kitchen azelastine (ASTELIN) 137 MCG/SPRAY nasal spray Place 2 sprays into both nostrils daily as needed for allergies. Use in each nostril as directed  . citalopram (CELEXA) 20 MG tablet Take 20 mg by mouth daily.  Marland Kitchen esomeprazole (NEXIUM) 40 MG capsule Take 40 mg by mouth 2 (two) times daily.    . fenofibrate (TRICOR) 145 MG tablet Take 145 mg by mouth at bedtime.   Marland Kitchen glipiZIDE (GLUCOTROL XL) 10 MG 24 hr tablet Take 10 mg by mouth 2 (two) times daily.  Marland Kitchen ibuprofen (ADVIL,MOTRIN) 200 MG tablet Take 600 mg by mouth every 6 (six) hours as needed for moderate pain.  Marland Kitchen loratadine (CLARITIN) 10 MG tablet Take 10 mg by mouth daily.  . metFORMIN (GLUCOPHAGE) 500 MG tablet Take 1,000 mg by mouth 2 (two) times daily with a meal.  . metoprolol (TOPROL-XL) 50 MG 24  hr tablet Take 50 mg by mouth daily.    Marland Kitchen olmesartan-hydrochlorothiazide (BENICAR HCT) 20-12.5 MG per tablet Take 1 tablet by mouth daily.  Marland Kitchen tiZANidine (ZANAFLEX) 4 MG tablet Take 4 mg by mouth 3 (three) times daily as needed for muscle spasms.   . traMADol (ULTRAM) 50 MG tablet Take 1 tablet by mouth every 6 (six) hours as needed.  . Vitamin D, Ergocalciferol, (DRISDOL) 50000 UNITS CAPS capsule Take 50,000 Units by mouth every 7 (seven) days. On Saturday  . [DISCONTINUED] albuterol (PROVENTIL HFA;VENTOLIN HFA) 108 (90 BASE) MCG/ACT inhaler Inhale 2 puffs into the lungs every 6 (six) hours as needed. For shortness of breath.   . predniSONE (STERAPRED UNI-PAK) 10 MG tablet Prednisone 10 mg take  4 each am x 2  days,   2 each am x 2 days,  1 each am x2days and stop

## 2013-06-26 NOTE — Assessment & Plan Note (Signed)
See VCD  A/p > referred to [facility]

## 2013-06-26 NOTE — Assessment & Plan Note (Addendum)
The max node is 1.5 cm which is not considered in a pathologic range and is not playing any role in her present symptoms but does need f/u in view of her smoking hx so will place in tickle file for f/u ov in 3 m

## 2013-06-26 NOTE — Assessment & Plan Note (Signed)
-   Trial off acei 05/23/13 >>> - spastic dysphonia /?right vocal cord paralysis >CT soft tissue neck /chest  06/10/13 > neg except 1.5 cm hilar node - Spirometry 06/24/2013 >  wn  With her pft's nl here she does not have a mechanical cause of stridor and just has VCD despite off ace x one month and on max gerd rx  The R hilar node is very unlikely to be pathogenic and would not affect either recurrent laryngeal nerve so best to refer to Voice center at [facility] at this point.

## 2013-06-26 NOTE — Assessment & Plan Note (Addendum)
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

## 2013-06-30 DIAGNOSIS — M940 Chondrocostal junction syndrome [Tietze]: Secondary | ICD-10-CM | POA: Diagnosis not present

## 2013-06-30 DIAGNOSIS — R079 Chest pain, unspecified: Secondary | ICD-10-CM | POA: Diagnosis not present

## 2013-06-30 DIAGNOSIS — I10 Essential (primary) hypertension: Secondary | ICD-10-CM | POA: Diagnosis not present

## 2013-06-30 DIAGNOSIS — J3800 Paralysis of vocal cords and larynx, unspecified: Secondary | ICD-10-CM | POA: Diagnosis not present

## 2013-06-30 DIAGNOSIS — R0602 Shortness of breath: Secondary | ICD-10-CM | POA: Diagnosis not present

## 2013-07-19 ENCOUNTER — Encounter: Payer: Self-pay | Admitting: Internal Medicine

## 2013-07-19 DIAGNOSIS — B370 Candidal stomatitis: Secondary | ICD-10-CM | POA: Diagnosis not present

## 2013-07-19 DIAGNOSIS — R0789 Other chest pain: Secondary | ICD-10-CM | POA: Diagnosis not present

## 2013-07-19 DIAGNOSIS — R0602 Shortness of breath: Secondary | ICD-10-CM | POA: Diagnosis not present

## 2013-07-19 DIAGNOSIS — D869 Sarcoidosis, unspecified: Secondary | ICD-10-CM | POA: Diagnosis not present

## 2013-08-02 DIAGNOSIS — K219 Gastro-esophageal reflux disease without esophagitis: Secondary | ICD-10-CM | POA: Diagnosis not present

## 2013-08-02 DIAGNOSIS — M542 Cervicalgia: Secondary | ICD-10-CM | POA: Diagnosis not present

## 2013-08-02 DIAGNOSIS — R0789 Other chest pain: Secondary | ICD-10-CM | POA: Diagnosis not present

## 2013-08-09 DIAGNOSIS — G4710 Hypersomnia, unspecified: Secondary | ICD-10-CM | POA: Diagnosis not present

## 2013-08-09 DIAGNOSIS — R0609 Other forms of dyspnea: Secondary | ICD-10-CM | POA: Diagnosis not present

## 2013-08-09 DIAGNOSIS — R5381 Other malaise: Secondary | ICD-10-CM | POA: Diagnosis not present

## 2013-08-09 DIAGNOSIS — J310 Chronic rhinitis: Secondary | ICD-10-CM | POA: Diagnosis not present

## 2013-08-10 DIAGNOSIS — R490 Dysphonia: Secondary | ICD-10-CM | POA: Diagnosis not present

## 2013-08-10 DIAGNOSIS — K219 Gastro-esophageal reflux disease without esophagitis: Secondary | ICD-10-CM | POA: Diagnosis not present

## 2013-08-10 DIAGNOSIS — G4710 Hypersomnia, unspecified: Secondary | ICD-10-CM | POA: Diagnosis not present

## 2013-08-10 DIAGNOSIS — R061 Stridor: Secondary | ICD-10-CM | POA: Diagnosis not present

## 2013-08-15 DIAGNOSIS — IMO0001 Reserved for inherently not codable concepts without codable children: Secondary | ICD-10-CM | POA: Diagnosis not present

## 2013-08-15 DIAGNOSIS — R061 Stridor: Secondary | ICD-10-CM | POA: Diagnosis not present

## 2013-08-15 DIAGNOSIS — R490 Dysphonia: Secondary | ICD-10-CM | POA: Diagnosis not present

## 2013-08-15 DIAGNOSIS — K219 Gastro-esophageal reflux disease without esophagitis: Secondary | ICD-10-CM | POA: Diagnosis not present

## 2013-08-16 DIAGNOSIS — R490 Dysphonia: Secondary | ICD-10-CM | POA: Diagnosis not present

## 2013-08-16 DIAGNOSIS — K219 Gastro-esophageal reflux disease without esophagitis: Secondary | ICD-10-CM | POA: Diagnosis not present

## 2013-08-16 DIAGNOSIS — R1310 Dysphagia, unspecified: Secondary | ICD-10-CM | POA: Diagnosis not present

## 2013-08-19 DIAGNOSIS — K219 Gastro-esophageal reflux disease without esophagitis: Secondary | ICD-10-CM | POA: Diagnosis not present

## 2013-08-19 DIAGNOSIS — IMO0001 Reserved for inherently not codable concepts without codable children: Secondary | ICD-10-CM | POA: Diagnosis not present

## 2013-08-19 DIAGNOSIS — R490 Dysphonia: Secondary | ICD-10-CM | POA: Diagnosis not present

## 2013-08-19 DIAGNOSIS — R061 Stridor: Secondary | ICD-10-CM | POA: Diagnosis not present

## 2013-08-21 DIAGNOSIS — E669 Obesity, unspecified: Secondary | ICD-10-CM | POA: Diagnosis not present

## 2013-08-21 DIAGNOSIS — I509 Heart failure, unspecified: Secondary | ICD-10-CM | POA: Diagnosis not present

## 2013-08-21 DIAGNOSIS — R55 Syncope and collapse: Secondary | ICD-10-CM | POA: Diagnosis not present

## 2013-08-21 DIAGNOSIS — Z8673 Personal history of transient ischemic attack (TIA), and cerebral infarction without residual deficits: Secondary | ICD-10-CM | POA: Diagnosis not present

## 2013-08-21 DIAGNOSIS — F411 Generalized anxiety disorder: Secondary | ICD-10-CM | POA: Diagnosis not present

## 2013-08-21 DIAGNOSIS — M542 Cervicalgia: Secondary | ICD-10-CM | POA: Diagnosis not present

## 2013-08-21 DIAGNOSIS — R5381 Other malaise: Secondary | ICD-10-CM | POA: Diagnosis not present

## 2013-08-21 DIAGNOSIS — J3800 Paralysis of vocal cords and larynx, unspecified: Secondary | ICD-10-CM | POA: Diagnosis not present

## 2013-08-21 DIAGNOSIS — R51 Headache: Secondary | ICD-10-CM | POA: Diagnosis not present

## 2013-08-21 DIAGNOSIS — R5383 Other fatigue: Secondary | ICD-10-CM | POA: Diagnosis not present

## 2013-08-21 DIAGNOSIS — I10 Essential (primary) hypertension: Secondary | ICD-10-CM | POA: Diagnosis not present

## 2013-08-21 DIAGNOSIS — F17200 Nicotine dependence, unspecified, uncomplicated: Secondary | ICD-10-CM | POA: Diagnosis not present

## 2013-08-21 DIAGNOSIS — E119 Type 2 diabetes mellitus without complications: Secondary | ICD-10-CM | POA: Diagnosis not present

## 2013-08-21 DIAGNOSIS — IMO0001 Reserved for inherently not codable concepts without codable children: Secondary | ICD-10-CM | POA: Diagnosis not present

## 2013-08-24 DIAGNOSIS — K219 Gastro-esophageal reflux disease without esophagitis: Secondary | ICD-10-CM | POA: Diagnosis not present

## 2013-08-24 DIAGNOSIS — R490 Dysphonia: Secondary | ICD-10-CM | POA: Diagnosis not present

## 2013-08-24 DIAGNOSIS — IMO0001 Reserved for inherently not codable concepts without codable children: Secondary | ICD-10-CM | POA: Diagnosis not present

## 2013-08-24 DIAGNOSIS — R061 Stridor: Secondary | ICD-10-CM | POA: Diagnosis not present

## 2013-08-29 DIAGNOSIS — R490 Dysphonia: Secondary | ICD-10-CM | POA: Diagnosis not present

## 2013-08-29 DIAGNOSIS — K219 Gastro-esophageal reflux disease without esophagitis: Secondary | ICD-10-CM | POA: Diagnosis not present

## 2013-08-29 DIAGNOSIS — IMO0001 Reserved for inherently not codable concepts without codable children: Secondary | ICD-10-CM | POA: Diagnosis not present

## 2013-08-29 DIAGNOSIS — R061 Stridor: Secondary | ICD-10-CM | POA: Diagnosis not present

## 2013-08-31 DIAGNOSIS — IMO0001 Reserved for inherently not codable concepts without codable children: Secondary | ICD-10-CM | POA: Diagnosis not present

## 2013-08-31 DIAGNOSIS — K219 Gastro-esophageal reflux disease without esophagitis: Secondary | ICD-10-CM | POA: Diagnosis not present

## 2013-08-31 DIAGNOSIS — R490 Dysphonia: Secondary | ICD-10-CM | POA: Diagnosis not present

## 2013-08-31 DIAGNOSIS — R061 Stridor: Secondary | ICD-10-CM | POA: Diagnosis not present

## 2013-09-05 DIAGNOSIS — R490 Dysphonia: Secondary | ICD-10-CM | POA: Diagnosis not present

## 2013-09-05 DIAGNOSIS — R061 Stridor: Secondary | ICD-10-CM | POA: Diagnosis not present

## 2013-09-05 DIAGNOSIS — K219 Gastro-esophageal reflux disease without esophagitis: Secondary | ICD-10-CM | POA: Diagnosis not present

## 2013-09-05 DIAGNOSIS — E782 Mixed hyperlipidemia: Secondary | ICD-10-CM | POA: Diagnosis not present

## 2013-09-05 DIAGNOSIS — IMO0001 Reserved for inherently not codable concepts without codable children: Secondary | ICD-10-CM | POA: Diagnosis not present

## 2013-09-05 DIAGNOSIS — F341 Dysthymic disorder: Secondary | ICD-10-CM | POA: Diagnosis not present

## 2013-09-08 ENCOUNTER — Telehealth: Payer: Self-pay | Admitting: *Deleted

## 2013-09-08 DIAGNOSIS — R1314 Dysphagia, pharyngoesophageal phase: Secondary | ICD-10-CM | POA: Diagnosis not present

## 2013-09-08 DIAGNOSIS — K228 Other specified diseases of esophagus: Secondary | ICD-10-CM | POA: Diagnosis not present

## 2013-09-08 DIAGNOSIS — R1310 Dysphagia, unspecified: Secondary | ICD-10-CM | POA: Diagnosis not present

## 2013-09-08 DIAGNOSIS — K219 Gastro-esophageal reflux disease without esophagitis: Secondary | ICD-10-CM | POA: Diagnosis not present

## 2013-09-08 DIAGNOSIS — K2289 Other specified disease of esophagus: Secondary | ICD-10-CM | POA: Diagnosis not present

## 2013-09-08 NOTE — Telephone Encounter (Signed)
LMTCB for the pt 

## 2013-09-08 NOTE — Telephone Encounter (Signed)
[phone] returning call to the nurse

## 2013-09-08 NOTE — Telephone Encounter (Signed)
Message copied by Rosana Berger on Thu Sep 08, 2013 12:25 PM ------      Message from: Christinia Gully B      Created: Sun Jun 26, 2013  5:47 AM       Ov due to f/u hilar R lymph nodes with cxr only unless ct chest done since 1st of year somewhere ------

## 2013-09-09 NOTE — Telephone Encounter (Signed)
Spoke with the pt and scheduled ov with MW for 09/27/13 at 9:15 am  I advised that she come in approx 10 min prior for cxr  She verbalized understanding

## 2013-09-12 DIAGNOSIS — K219 Gastro-esophageal reflux disease without esophagitis: Secondary | ICD-10-CM | POA: Diagnosis not present

## 2013-09-12 DIAGNOSIS — R490 Dysphonia: Secondary | ICD-10-CM | POA: Diagnosis not present

## 2013-09-12 DIAGNOSIS — R061 Stridor: Secondary | ICD-10-CM | POA: Diagnosis not present

## 2013-09-12 DIAGNOSIS — J3801 Paralysis of vocal cords and larynx, unilateral: Secondary | ICD-10-CM | POA: Diagnosis not present

## 2013-09-12 DIAGNOSIS — F449 Dissociative and conversion disorder, unspecified: Secondary | ICD-10-CM | POA: Diagnosis not present

## 2013-09-12 DIAGNOSIS — IMO0001 Reserved for inherently not codable concepts without codable children: Secondary | ICD-10-CM | POA: Diagnosis not present

## 2013-09-14 DIAGNOSIS — IMO0001 Reserved for inherently not codable concepts without codable children: Secondary | ICD-10-CM | POA: Diagnosis not present

## 2013-09-14 DIAGNOSIS — R061 Stridor: Secondary | ICD-10-CM | POA: Diagnosis not present

## 2013-09-14 DIAGNOSIS — K219 Gastro-esophageal reflux disease without esophagitis: Secondary | ICD-10-CM | POA: Diagnosis not present

## 2013-09-14 DIAGNOSIS — R490 Dysphonia: Secondary | ICD-10-CM | POA: Diagnosis not present

## 2013-09-19 DIAGNOSIS — R490 Dysphonia: Secondary | ICD-10-CM | POA: Diagnosis not present

## 2013-09-19 DIAGNOSIS — IMO0001 Reserved for inherently not codable concepts without codable children: Secondary | ICD-10-CM | POA: Diagnosis not present

## 2013-09-19 DIAGNOSIS — R061 Stridor: Secondary | ICD-10-CM | POA: Diagnosis not present

## 2013-09-19 DIAGNOSIS — K219 Gastro-esophageal reflux disease without esophagitis: Secondary | ICD-10-CM | POA: Diagnosis not present

## 2013-09-21 DIAGNOSIS — R490 Dysphonia: Secondary | ICD-10-CM | POA: Diagnosis not present

## 2013-09-21 DIAGNOSIS — K219 Gastro-esophageal reflux disease without esophagitis: Secondary | ICD-10-CM | POA: Diagnosis not present

## 2013-09-21 DIAGNOSIS — IMO0001 Reserved for inherently not codable concepts without codable children: Secondary | ICD-10-CM | POA: Diagnosis not present

## 2013-09-21 DIAGNOSIS — R061 Stridor: Secondary | ICD-10-CM | POA: Diagnosis not present

## 2013-09-26 DIAGNOSIS — IMO0001 Reserved for inherently not codable concepts without codable children: Secondary | ICD-10-CM | POA: Diagnosis not present

## 2013-09-26 DIAGNOSIS — R490 Dysphonia: Secondary | ICD-10-CM | POA: Diagnosis not present

## 2013-09-26 DIAGNOSIS — K219 Gastro-esophageal reflux disease without esophagitis: Secondary | ICD-10-CM | POA: Diagnosis not present

## 2013-09-26 DIAGNOSIS — R061 Stridor: Secondary | ICD-10-CM | POA: Diagnosis not present

## 2013-09-27 ENCOUNTER — Ambulatory Visit (INDEPENDENT_AMBULATORY_CARE_PROVIDER_SITE_OTHER)
Admission: RE | Admit: 2013-09-27 | Discharge: 2013-09-27 | Disposition: A | Discharge status: Home / Self Care | Payer: Medicare Other | Source: Ambulatory Visit | Attending: Internal Medicine | Admitting: Internal Medicine

## 2013-09-27 ENCOUNTER — Encounter: Payer: Self-pay | Admitting: Internal Medicine

## 2013-09-27 ENCOUNTER — Ambulatory Visit (INDEPENDENT_AMBULATORY_CARE_PROVIDER_SITE_OTHER): Payer: Medicare Other | Admitting: Internal Medicine

## 2013-09-27 VITALS — BP 140/82 | HR 77 | Temp 97.8°F | Ht 65.0 in | Wt 226.0 lb

## 2013-09-27 DIAGNOSIS — J9819 Other pulmonary collapse: Secondary | ICD-10-CM | POA: Diagnosis not present

## 2013-09-27 DIAGNOSIS — R0609 Other forms of dyspnea: Secondary | ICD-10-CM

## 2013-09-27 DIAGNOSIS — J383 Other diseases of vocal cords: Secondary | ICD-10-CM

## 2013-09-27 DIAGNOSIS — R0989 Other specified symptoms and signs involving the circulatory and respiratory systems: Secondary | ICD-10-CM

## 2013-09-27 DIAGNOSIS — R599 Enlarged lymph nodes, unspecified: Secondary | ICD-10-CM | POA: Diagnosis not present

## 2013-09-27 DIAGNOSIS — R590 Localized enlarged lymph nodes: Secondary | ICD-10-CM

## 2013-09-27 DIAGNOSIS — R0600 Dyspnea, unspecified: Secondary | ICD-10-CM

## 2013-09-27 MED ORDER — PREDNISONE 10 MG PO TABS: ORAL_TABLET | ORAL | Status: DC

## 2013-09-27 NOTE — Assessment & Plan Note (Signed)
-   Trial off acei 05/23/13 >>> - spastic dysphonia /?right vocal cord paralysis >CT soft tissue neck /chest  06/10/13 > neg except 1.5 cm hilar node - Spirometry 06/24/2013 >  wnl  - spirometry 09/27/2013 wnl during spell  Strongly doubt this is asthma, pulmonary f/u can be prn  In meantime, The proper method of use, as well as anticipated side effects, of a metered-dose inhaler are discussed and demonstrated to the patient. Improved effectiveness after extensive coaching during this visit to a level of approximately  75% so ok to use saba hfa prn and use the neb as a backup awaiting ent re-eval, hopefully while symptomatic and rx with pred x 6 d only

## 2013-09-27 NOTE — Progress Notes (Signed)
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 Has sensation of throat congestion and feeling can't breath in causes chest to feel pressure on insp.  >>REC :  Stop advair and lisinopril Start benicar 20/12.5 Prednisone taper  nexium 40 mg daily    06/09/2013 Follow up and Med review  Returns for follow up  and med review.  Since last ov feel that his dyspnea is essentially unchanged but worse some days.  went to ED 11/12 for CP; had 2D Cardiac enzymes were neg. BNP nml.  Referred to OP cardiology  Echo yesterday w/ cards in [place] Unfortunately, did not bring meds with her today.  CXR today with atx in left base. No acute process noted.  Last ov seen for pulmonary consult , recommended to stop ACE and Advair , begin PPI , given steroid taper.   Complains that since 04/17/13 , has dyspnea,  Hoarseness, difficulty talking with broken speech.  Wearing CPAP helps with dyspnea.  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  Talking makes chest pain and dyspnea worse.  No hemoptysis , chest pain, orthopnea, edema , n/v,  Muscle weakness, speech or vision changes.  rec No change rx   06/24/2013 f/u ov/Kristi Kramer re: sob/vcd Chief Complaint  Patient presents with  . Follow-up    SOB increased x2 weeks, feeling asthough throat become tight   still not able to confirm all meds but assures me she stopped advair and lisinopril rec pred x 6    09/27/2013 acute  ov/Kristi Kramer re: flare of sob ? Asthma vs vcd/  f/u adenopathy  Chief Complaint  Patient presents with  . Follow-up    Breathing is slightly worse the past 2-3 days. She also c/o increased hoarsness for the past 2 days. She has used neb x 2 since the last visit.   pred helps to point where doesn't need albuterol, saw voice center ent but much worse since then    No change hoarsenss, min dry cough. Only comfortable when cpap is in place/ no change with saba. Has not tried prednisone off acei yet.   No obvious day to day or daytime variabilty or assoc excess or purulent sputum or cp or chest tightness, subjective wheeze overt sinus or hb symptoms. No unusual exp hx or h/o childhood pna/ asthma or knowledge of premature birth.  Sleeping ok on cpap without nocturnal  or early am exacerbation  of respiratory  c/o's or need for noct saba. Also denies any obvious fluctuation of symptoms with weather or environmental changes or other aggravating or alleviating factors except as outlined above   Current Medications, Allergies, Complete Past Medical History, Past Surgical History, Family History, and Social History were reviewed in [employer] record.  ROS  The following are not active complaints unless bolded sore throat, dysphagia, dental problems, itching, sneezing,  nasal congestion or excess/ purulent secretions, ear ache,   fever, chills, sweats, unintended wt  loss, pleuritic or exertional cp, hemoptysis,  orthopnea pnd or leg swelling, presyncope, palpitations, heartburn, abdominal pain, anorexia, nausea, vomiting, diarrhea  or change in bowel or urinary habits, change in stools or urine, dysuria,hematuria,  rash, arthralgias, visual complaints, headache, numbness weakness or ataxia or problems with walking or coordination,  change in mood/affect or memory.                 Objective:   Physical Exam  Very hoarse amb wf with classic pseudowheeze   spastic dysphonia    Wt Readings from Last 3 Encounters:   06/24/13 226 lb (102.513 kg)  06/09/13 223 lb 12.8 oz (101.515 kg)  05/23/13 223 lb (101.152 kg)         HEENT: nl dentition, turbinates, and orophanx. Nl external ear canals without cough reflex   NECK :  without JVD/Nodes/TM/ nl carotid upstrokes bilaterally   LUNGS: no acc muscle use, completely clear to A and P bilaterally without cough on insp or exp maneuvers   CV:  RRR  no s3 or murmur or increase in P2, no edema   ABD:  soft and nontender with nl excursion in the supine position. No bruits or organomegaly, bowel sounds nl  MS:  warm without deformities, calf tenderness, cyanosis or clubbing, Equal strength of UE /LE , nml gait  SKIN: warm and dry without lesions    NEURO:  alert, approp, no deficits No tremor noted, CN 2-12 intact.     CTa 06/10/13 Enlarged right hilar lymph nodes. The largest has a short axis  diameter of 1.5 cm on image number 23. No enlarged mediastinal or  left hilar nodes. Calcified granuloma at the left lung base.  Otherwise, no lung nodules. Diffuse low density of the liver  relative to the spleen. Thoracic spine degenerative changes.  Cervical spine fixation hardware.   pfts nl 06/24/2013  Spirometry with symptoms 09/27/2013 > wnl        Assessment & Plan:

## 2013-09-27 NOTE — Patient Instructions (Addendum)
Prednisone 10 mg take  4 each am x 2 days,   2 each am x 2 days,  1 each am x 2 days and stop   Work on inhaler technique:  relax and gently blow all the way out then take a nice smooth deep breath back in, triggering the inhaler at same time you start breathing in.  Hold for up to 5 seconds if you can.  Rinse and gargle with water when done     If breathing bothers you again I strongly feel Dr Joya Gaskins should see you during the time the breathing is bad to verify this is a throat problem because your lung function today was normal  Pulmonary follow up is as needed - if not doing well first step is return to [facility] with all meds in hand

## 2013-09-27 NOTE — Assessment & Plan Note (Addendum)
Referred to voice center 06/24/2013 > seen 09/12/13 by Dr Joya Gaskins but no active symptoms then - spirometry 09/27/2013 wnl including [id number] with active symptoms   Clearly this is not asthma though she does appear to have some form of airway irritation that improves on pred and certainly mimics asthma  For now rec max gerd rx and encouraged pt to f/u with Dr Joya Gaskins at [facility] when symptomatic and "but time " awaiting appt by using the saba prn and in meantime work with speech therapy closely

## 2013-09-27 NOTE — Assessment & Plan Note (Signed)
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

## 2013-09-28 DIAGNOSIS — IMO0001 Reserved for inherently not codable concepts without codable children: Secondary | ICD-10-CM | POA: Diagnosis not present

## 2013-09-28 DIAGNOSIS — N390 Urinary tract infection, site not specified: Secondary | ICD-10-CM | POA: Diagnosis not present

## 2013-09-28 DIAGNOSIS — R061 Stridor: Secondary | ICD-10-CM | POA: Diagnosis not present

## 2013-09-28 DIAGNOSIS — B3731 Acute candidiasis of vulva and vagina: Secondary | ICD-10-CM | POA: Diagnosis not present

## 2013-09-28 DIAGNOSIS — R490 Dysphonia: Secondary | ICD-10-CM | POA: Diagnosis not present

## 2013-09-28 DIAGNOSIS — K219 Gastro-esophageal reflux disease without esophagitis: Secondary | ICD-10-CM | POA: Diagnosis not present

## 2013-09-28 DIAGNOSIS — B373 Candidiasis of vulva and vagina: Secondary | ICD-10-CM | POA: Diagnosis not present

## 2013-10-03 DIAGNOSIS — R061 Stridor: Secondary | ICD-10-CM | POA: Diagnosis not present

## 2013-10-03 DIAGNOSIS — IMO0001 Reserved for inherently not codable concepts without codable children: Secondary | ICD-10-CM | POA: Diagnosis not present

## 2013-10-03 DIAGNOSIS — R490 Dysphonia: Secondary | ICD-10-CM | POA: Diagnosis not present

## 2013-10-03 DIAGNOSIS — K219 Gastro-esophageal reflux disease without esophagitis: Secondary | ICD-10-CM | POA: Diagnosis not present

## 2013-10-05 DIAGNOSIS — R490 Dysphonia: Secondary | ICD-10-CM | POA: Diagnosis not present

## 2013-10-05 DIAGNOSIS — R061 Stridor: Secondary | ICD-10-CM | POA: Diagnosis not present

## 2013-10-05 DIAGNOSIS — K219 Gastro-esophageal reflux disease without esophagitis: Secondary | ICD-10-CM | POA: Diagnosis not present

## 2013-10-05 DIAGNOSIS — IMO0001 Reserved for inherently not codable concepts without codable children: Secondary | ICD-10-CM | POA: Diagnosis not present

## 2013-10-10 DIAGNOSIS — K219 Gastro-esophageal reflux disease without esophagitis: Secondary | ICD-10-CM | POA: Diagnosis not present

## 2013-10-10 DIAGNOSIS — R300 Dysuria: Secondary | ICD-10-CM | POA: Diagnosis not present

## 2013-10-12 DIAGNOSIS — R061 Stridor: Secondary | ICD-10-CM | POA: Diagnosis not present

## 2013-10-12 DIAGNOSIS — IMO0001 Reserved for inherently not codable concepts without codable children: Secondary | ICD-10-CM | POA: Diagnosis not present

## 2013-10-12 DIAGNOSIS — K219 Gastro-esophageal reflux disease without esophagitis: Secondary | ICD-10-CM | POA: Diagnosis not present

## 2013-10-12 DIAGNOSIS — R490 Dysphonia: Secondary | ICD-10-CM | POA: Diagnosis not present

## 2013-10-17 DIAGNOSIS — R490 Dysphonia: Secondary | ICD-10-CM | POA: Diagnosis not present

## 2013-10-17 DIAGNOSIS — R061 Stridor: Secondary | ICD-10-CM | POA: Diagnosis not present

## 2013-10-17 DIAGNOSIS — IMO0001 Reserved for inherently not codable concepts without codable children: Secondary | ICD-10-CM | POA: Diagnosis not present

## 2013-10-17 DIAGNOSIS — K219 Gastro-esophageal reflux disease without esophagitis: Secondary | ICD-10-CM | POA: Diagnosis not present

## 2013-10-24 DIAGNOSIS — N318 Other neuromuscular dysfunction of bladder: Secondary | ICD-10-CM | POA: Diagnosis not present

## 2013-10-24 DIAGNOSIS — N390 Urinary tract infection, site not specified: Secondary | ICD-10-CM | POA: Diagnosis not present

## 2013-10-24 DIAGNOSIS — N201 Calculus of ureter: Secondary | ICD-10-CM | POA: Diagnosis not present

## 2013-10-24 DIAGNOSIS — F17200 Nicotine dependence, unspecified, uncomplicated: Secondary | ICD-10-CM | POA: Diagnosis not present

## 2013-10-25 DIAGNOSIS — H1130 Conjunctival hemorrhage, unspecified eye: Secondary | ICD-10-CM | POA: Diagnosis not present

## 2013-10-25 DIAGNOSIS — J309 Allergic rhinitis, unspecified: Secondary | ICD-10-CM | POA: Diagnosis not present

## 2013-10-26 DIAGNOSIS — R109 Unspecified abdominal pain: Secondary | ICD-10-CM | POA: Diagnosis not present

## 2013-10-26 DIAGNOSIS — K5730 Diverticulosis of large intestine without perforation or abscess without bleeding: Secondary | ICD-10-CM | POA: Diagnosis not present

## 2013-10-31 DIAGNOSIS — R109 Unspecified abdominal pain: Secondary | ICD-10-CM | POA: Diagnosis not present

## 2013-10-31 DIAGNOSIS — N390 Urinary tract infection, site not specified: Secondary | ICD-10-CM | POA: Diagnosis not present

## 2013-10-31 DIAGNOSIS — N393 Stress incontinence (female) (male): Secondary | ICD-10-CM | POA: Diagnosis not present

## 2013-12-06 DIAGNOSIS — E119 Type 2 diabetes mellitus without complications: Secondary | ICD-10-CM | POA: Diagnosis not present

## 2013-12-06 DIAGNOSIS — E782 Mixed hyperlipidemia: Secondary | ICD-10-CM | POA: Diagnosis not present

## 2013-12-06 DIAGNOSIS — K219 Gastro-esophageal reflux disease without esophagitis: Secondary | ICD-10-CM | POA: Diagnosis not present

## 2013-12-06 DIAGNOSIS — R490 Dysphonia: Secondary | ICD-10-CM | POA: Diagnosis not present

## 2013-12-06 DIAGNOSIS — IMO0001 Reserved for inherently not codable concepts without codable children: Secondary | ICD-10-CM | POA: Diagnosis not present

## 2013-12-06 DIAGNOSIS — Z9181 History of falling: Secondary | ICD-10-CM | POA: Diagnosis not present

## 2013-12-19 DIAGNOSIS — R0789 Other chest pain: Secondary | ICD-10-CM | POA: Diagnosis not present

## 2013-12-19 DIAGNOSIS — M5412 Radiculopathy, cervical region: Secondary | ICD-10-CM | POA: Diagnosis not present

## 2013-12-20 ENCOUNTER — Other Ambulatory Visit: Payer: Self-pay | Admitting: Internal Medicine

## 2014-01-03 DIAGNOSIS — Z1231 Encounter for screening mammogram for malignant neoplasm of breast: Secondary | ICD-10-CM | POA: Diagnosis not present

## 2014-02-06 DIAGNOSIS — N318 Other neuromuscular dysfunction of bladder: Secondary | ICD-10-CM | POA: Diagnosis not present

## 2014-02-06 DIAGNOSIS — N201 Calculus of ureter: Secondary | ICD-10-CM | POA: Diagnosis not present

## 2014-02-06 DIAGNOSIS — N390 Urinary tract infection, site not specified: Secondary | ICD-10-CM | POA: Diagnosis not present

## 2014-03-09 DIAGNOSIS — G43909 Migraine, unspecified, not intractable, without status migrainosus: Secondary | ICD-10-CM | POA: Diagnosis not present

## 2014-03-09 DIAGNOSIS — M542 Cervicalgia: Secondary | ICD-10-CM | POA: Diagnosis not present

## 2014-03-15 DIAGNOSIS — I10 Essential (primary) hypertension: Secondary | ICD-10-CM | POA: Diagnosis not present

## 2014-03-15 DIAGNOSIS — E782 Mixed hyperlipidemia: Secondary | ICD-10-CM | POA: Diagnosis not present

## 2014-03-15 DIAGNOSIS — Z79899 Other long term (current) drug therapy: Secondary | ICD-10-CM | POA: Diagnosis not present

## 2014-03-15 DIAGNOSIS — E119 Type 2 diabetes mellitus without complications: Secondary | ICD-10-CM | POA: Diagnosis not present

## 2014-03-15 DIAGNOSIS — F341 Dysthymic disorder: Secondary | ICD-10-CM | POA: Diagnosis not present

## 2014-03-15 DIAGNOSIS — J449 Chronic obstructive pulmonary disease, unspecified: Secondary | ICD-10-CM | POA: Diagnosis not present

## 2014-04-05 DIAGNOSIS — J449 Chronic obstructive pulmonary disease, unspecified: Secondary | ICD-10-CM | POA: Diagnosis not present

## 2014-04-05 DIAGNOSIS — J019 Acute sinusitis, unspecified: Secondary | ICD-10-CM | POA: Diagnosis not present

## 2014-04-05 DIAGNOSIS — Z23 Encounter for immunization: Secondary | ICD-10-CM | POA: Diagnosis not present

## 2014-04-05 DIAGNOSIS — J4489 Other specified chronic obstructive pulmonary disease: Secondary | ICD-10-CM | POA: Diagnosis not present

## 2014-05-23 DIAGNOSIS — M7060 Trochanteric bursitis, unspecified hip: Secondary | ICD-10-CM | POA: Diagnosis not present

## 2014-05-23 DIAGNOSIS — M542 Cervicalgia: Secondary | ICD-10-CM | POA: Diagnosis not present

## 2014-05-23 DIAGNOSIS — R042 Hemoptysis: Secondary | ICD-10-CM | POA: Diagnosis not present

## 2014-06-02 DIAGNOSIS — M25552 Pain in left hip: Secondary | ICD-10-CM | POA: Diagnosis not present

## 2014-06-02 DIAGNOSIS — M545 Low back pain: Secondary | ICD-10-CM | POA: Diagnosis not present

## 2014-06-05 DIAGNOSIS — D1809 Hemangioma of other sites: Secondary | ICD-10-CM | POA: Diagnosis not present

## 2014-06-05 DIAGNOSIS — M5137 Other intervertebral disc degeneration, lumbosacral region: Secondary | ICD-10-CM | POA: Diagnosis not present

## 2014-06-05 DIAGNOSIS — M545 Low back pain: Secondary | ICD-10-CM | POA: Diagnosis not present

## 2014-06-05 DIAGNOSIS — M25552 Pain in left hip: Secondary | ICD-10-CM | POA: Diagnosis not present

## 2014-06-14 DIAGNOSIS — M545 Low back pain: Secondary | ICD-10-CM | POA: Diagnosis not present

## 2014-06-20 DIAGNOSIS — H2511 Age-related nuclear cataract, right eye: Secondary | ICD-10-CM | POA: Diagnosis not present

## 2014-07-17 DIAGNOSIS — E1165 Type 2 diabetes mellitus with hyperglycemia: Secondary | ICD-10-CM | POA: Diagnosis not present

## 2014-07-17 DIAGNOSIS — J019 Acute sinusitis, unspecified: Secondary | ICD-10-CM | POA: Diagnosis not present

## 2014-07-17 DIAGNOSIS — E782 Mixed hyperlipidemia: Secondary | ICD-10-CM | POA: Diagnosis not present

## 2014-07-17 DIAGNOSIS — M545 Low back pain: Secondary | ICD-10-CM | POA: Diagnosis not present

## 2014-07-17 DIAGNOSIS — F329 Major depressive disorder, single episode, unspecified: Secondary | ICD-10-CM | POA: Diagnosis not present

## 2014-07-17 DIAGNOSIS — I10 Essential (primary) hypertension: Secondary | ICD-10-CM | POA: Diagnosis not present

## 2014-07-17 DIAGNOSIS — J449 Chronic obstructive pulmonary disease, unspecified: Secondary | ICD-10-CM | POA: Diagnosis not present

## 2014-07-17 DIAGNOSIS — K219 Gastro-esophageal reflux disease without esophagitis: Secondary | ICD-10-CM | POA: Diagnosis not present

## 2014-07-26 DIAGNOSIS — M5136 Other intervertebral disc degeneration, lumbar region: Secondary | ICD-10-CM | POA: Diagnosis not present

## 2014-07-29 DIAGNOSIS — M47816 Spondylosis without myelopathy or radiculopathy, lumbar region: Secondary | ICD-10-CM | POA: Diagnosis not present

## 2014-07-29 DIAGNOSIS — M5136 Other intervertebral disc degeneration, lumbar region: Secondary | ICD-10-CM | POA: Diagnosis not present

## 2014-08-08 DIAGNOSIS — M5126 Other intervertebral disc displacement, lumbar region: Secondary | ICD-10-CM | POA: Diagnosis not present

## 2014-08-08 DIAGNOSIS — M5136 Other intervertebral disc degeneration, lumbar region: Secondary | ICD-10-CM | POA: Diagnosis not present

## 2014-08-15 DIAGNOSIS — E119 Type 2 diabetes mellitus without complications: Secondary | ICD-10-CM | POA: Diagnosis not present

## 2014-08-15 DIAGNOSIS — I10 Essential (primary) hypertension: Secondary | ICD-10-CM | POA: Diagnosis not present

## 2014-08-15 DIAGNOSIS — H2511 Age-related nuclear cataract, right eye: Secondary | ICD-10-CM | POA: Diagnosis not present

## 2014-08-15 DIAGNOSIS — H269 Unspecified cataract: Secondary | ICD-10-CM | POA: Diagnosis not present

## 2014-08-15 DIAGNOSIS — K219 Gastro-esophageal reflux disease without esophagitis: Secondary | ICD-10-CM | POA: Diagnosis not present

## 2014-08-15 DIAGNOSIS — G459 Transient cerebral ischemic attack, unspecified: Secondary | ICD-10-CM | POA: Diagnosis not present

## 2014-08-15 DIAGNOSIS — E785 Hyperlipidemia, unspecified: Secondary | ICD-10-CM | POA: Diagnosis not present

## 2014-08-15 DIAGNOSIS — Z79899 Other long term (current) drug therapy: Secondary | ICD-10-CM | POA: Diagnosis not present

## 2014-08-15 DIAGNOSIS — Z8673 Personal history of transient ischemic attack (TIA), and cerebral infarction without residual deficits: Secondary | ICD-10-CM | POA: Diagnosis not present

## 2014-08-15 DIAGNOSIS — J449 Chronic obstructive pulmonary disease, unspecified: Secondary | ICD-10-CM | POA: Diagnosis not present

## 2014-08-15 DIAGNOSIS — J45909 Unspecified asthma, uncomplicated: Secondary | ICD-10-CM | POA: Diagnosis not present

## 2014-08-15 DIAGNOSIS — Z7982 Long term (current) use of aspirin: Secondary | ICD-10-CM | POA: Diagnosis not present

## 2014-08-15 DIAGNOSIS — Z720 Tobacco use: Secondary | ICD-10-CM | POA: Diagnosis not present

## 2014-08-15 DIAGNOSIS — H25811 Combined forms of age-related cataract, right eye: Secondary | ICD-10-CM | POA: Diagnosis not present

## 2014-08-15 DIAGNOSIS — E669 Obesity, unspecified: Secondary | ICD-10-CM | POA: Diagnosis not present

## 2014-08-15 DIAGNOSIS — Z9981 Dependence on supplemental oxygen: Secondary | ICD-10-CM | POA: Diagnosis not present

## 2014-08-15 DIAGNOSIS — G4733 Obstructive sleep apnea (adult) (pediatric): Secondary | ICD-10-CM | POA: Diagnosis not present

## 2014-08-15 DIAGNOSIS — F519 Sleep disorder not due to a substance or known physiological condition, unspecified: Secondary | ICD-10-CM | POA: Diagnosis not present

## 2014-08-23 DIAGNOSIS — M549 Dorsalgia, unspecified: Secondary | ICD-10-CM | POA: Diagnosis not present

## 2014-08-23 DIAGNOSIS — M5126 Other intervertebral disc displacement, lumbar region: Secondary | ICD-10-CM | POA: Diagnosis not present

## 2014-08-23 DIAGNOSIS — Z6838 Body mass index (BMI) 38.0-38.9, adult: Secondary | ICD-10-CM | POA: Diagnosis not present

## 2014-08-29 DIAGNOSIS — Z7982 Long term (current) use of aspirin: Secondary | ICD-10-CM | POA: Diagnosis not present

## 2014-08-29 DIAGNOSIS — J439 Emphysema, unspecified: Secondary | ICD-10-CM | POA: Diagnosis not present

## 2014-08-29 DIAGNOSIS — E119 Type 2 diabetes mellitus without complications: Secondary | ICD-10-CM | POA: Diagnosis not present

## 2014-08-29 DIAGNOSIS — R51 Headache: Secondary | ICD-10-CM | POA: Diagnosis not present

## 2014-08-29 DIAGNOSIS — I10 Essential (primary) hypertension: Secondary | ICD-10-CM | POA: Diagnosis not present

## 2014-08-29 DIAGNOSIS — R112 Nausea with vomiting, unspecified: Secondary | ICD-10-CM | POA: Diagnosis not present

## 2014-09-01 DIAGNOSIS — I10 Essential (primary) hypertension: Secondary | ICD-10-CM | POA: Diagnosis not present

## 2014-09-01 DIAGNOSIS — F418 Other specified anxiety disorders: Secondary | ICD-10-CM | POA: Diagnosis not present

## 2014-09-01 DIAGNOSIS — G43009 Migraine without aura, not intractable, without status migrainosus: Secondary | ICD-10-CM | POA: Diagnosis not present

## 2014-09-08 DIAGNOSIS — Z1389 Encounter for screening for other disorder: Secondary | ICD-10-CM | POA: Diagnosis not present

## 2014-09-08 DIAGNOSIS — G43009 Migraine without aura, not intractable, without status migrainosus: Secondary | ICD-10-CM | POA: Diagnosis not present

## 2014-09-08 DIAGNOSIS — Z6839 Body mass index (BMI) 39.0-39.9, adult: Secondary | ICD-10-CM | POA: Diagnosis not present

## 2014-09-08 DIAGNOSIS — M545 Low back pain: Secondary | ICD-10-CM | POA: Diagnosis not present

## 2014-09-08 DIAGNOSIS — I10 Essential (primary) hypertension: Secondary | ICD-10-CM | POA: Diagnosis not present

## 2014-09-20 DIAGNOSIS — F17210 Nicotine dependence, cigarettes, uncomplicated: Secondary | ICD-10-CM | POA: Diagnosis not present

## 2014-09-20 DIAGNOSIS — E119 Type 2 diabetes mellitus without complications: Secondary | ICD-10-CM | POA: Diagnosis not present

## 2014-09-20 DIAGNOSIS — I10 Essential (primary) hypertension: Secondary | ICD-10-CM | POA: Diagnosis not present

## 2014-09-20 DIAGNOSIS — M545 Low back pain: Secondary | ICD-10-CM | POA: Diagnosis not present

## 2014-09-20 DIAGNOSIS — M5416 Radiculopathy, lumbar region: Secondary | ICD-10-CM | POA: Diagnosis not present

## 2014-09-20 DIAGNOSIS — Z7982 Long term (current) use of aspirin: Secondary | ICD-10-CM | POA: Diagnosis not present

## 2014-09-26 DIAGNOSIS — H2512 Age-related nuclear cataract, left eye: Secondary | ICD-10-CM | POA: Diagnosis not present

## 2014-09-27 DIAGNOSIS — R42 Dizziness and giddiness: Secondary | ICD-10-CM | POA: Diagnosis not present

## 2014-09-27 DIAGNOSIS — G4733 Obstructive sleep apnea (adult) (pediatric): Secondary | ICD-10-CM | POA: Diagnosis present

## 2014-09-27 DIAGNOSIS — F419 Anxiety disorder, unspecified: Secondary | ICD-10-CM | POA: Diagnosis present

## 2014-09-27 DIAGNOSIS — B9620 Unspecified Escherichia coli [E. coli] as the cause of diseases classified elsewhere: Secondary | ICD-10-CM | POA: Diagnosis present

## 2014-09-27 DIAGNOSIS — K219 Gastro-esophageal reflux disease without esophagitis: Secondary | ICD-10-CM | POA: Diagnosis present

## 2014-09-27 DIAGNOSIS — R278 Other lack of coordination: Secondary | ICD-10-CM | POA: Diagnosis not present

## 2014-09-27 DIAGNOSIS — Z9981 Dependence on supplemental oxygen: Secondary | ICD-10-CM | POA: Diagnosis not present

## 2014-09-27 DIAGNOSIS — I10 Essential (primary) hypertension: Secondary | ICD-10-CM | POA: Diagnosis not present

## 2014-09-27 DIAGNOSIS — F17218 Nicotine dependence, cigarettes, with other nicotine-induced disorders: Secondary | ICD-10-CM | POA: Diagnosis present

## 2014-09-27 DIAGNOSIS — M6281 Muscle weakness (generalized): Secondary | ICD-10-CM | POA: Diagnosis not present

## 2014-09-27 DIAGNOSIS — Z1623 Resistance to quinolones and fluoroquinolones: Secondary | ICD-10-CM | POA: Diagnosis present

## 2014-09-27 DIAGNOSIS — E1142 Type 2 diabetes mellitus with diabetic polyneuropathy: Secondary | ICD-10-CM | POA: Diagnosis not present

## 2014-09-27 DIAGNOSIS — J439 Emphysema, unspecified: Secondary | ICD-10-CM | POA: Diagnosis present

## 2014-09-27 DIAGNOSIS — I779 Disorder of arteries and arterioles, unspecified: Secondary | ICD-10-CM | POA: Diagnosis present

## 2014-09-27 DIAGNOSIS — I69954 Hemiplegia and hemiparesis following unspecified cerebrovascular disease affecting left non-dominant side: Secondary | ICD-10-CM | POA: Diagnosis not present

## 2014-09-27 DIAGNOSIS — M1990 Unspecified osteoarthritis, unspecified site: Secondary | ICD-10-CM | POA: Diagnosis not present

## 2014-09-27 DIAGNOSIS — R2681 Unsteadiness on feet: Secondary | ICD-10-CM | POA: Diagnosis not present

## 2014-09-27 DIAGNOSIS — I639 Cerebral infarction, unspecified: Secondary | ICD-10-CM | POA: Diagnosis not present

## 2014-09-27 DIAGNOSIS — I63541 Cerebral infarction due to unspecified occlusion or stenosis of right cerebellar artery: Secondary | ICD-10-CM | POA: Diagnosis not present

## 2014-09-27 DIAGNOSIS — H259 Unspecified age-related cataract: Secondary | ICD-10-CM | POA: Diagnosis present

## 2014-09-27 DIAGNOSIS — E0942 Drug or chemical induced diabetes mellitus with neurological complications with diabetic polyneuropathy: Secondary | ICD-10-CM | POA: Diagnosis not present

## 2014-09-27 DIAGNOSIS — M25552 Pain in left hip: Secondary | ICD-10-CM | POA: Diagnosis not present

## 2014-09-27 DIAGNOSIS — E784 Other hyperlipidemia: Secondary | ICD-10-CM | POA: Diagnosis not present

## 2014-09-27 DIAGNOSIS — N390 Urinary tract infection, site not specified: Secondary | ICD-10-CM | POA: Diagnosis not present

## 2014-09-27 DIAGNOSIS — I517 Cardiomegaly: Secondary | ICD-10-CM | POA: Diagnosis not present

## 2014-09-27 DIAGNOSIS — Z9181 History of falling: Secondary | ICD-10-CM | POA: Diagnosis not present

## 2014-09-27 DIAGNOSIS — I6523 Occlusion and stenosis of bilateral carotid arteries: Secondary | ICD-10-CM | POA: Diagnosis not present

## 2014-09-27 DIAGNOSIS — J9610 Chronic respiratory failure, unspecified whether with hypoxia or hypercapnia: Secondary | ICD-10-CM | POA: Diagnosis not present

## 2014-09-27 DIAGNOSIS — Z8673 Personal history of transient ischemic attack (TIA), and cerebral infarction without residual deficits: Secondary | ICD-10-CM | POA: Diagnosis not present

## 2014-09-27 DIAGNOSIS — N3000 Acute cystitis without hematuria: Secondary | ICD-10-CM | POA: Diagnosis not present

## 2014-09-27 DIAGNOSIS — S0990XD Unspecified injury of head, subsequent encounter: Secondary | ICD-10-CM | POA: Diagnosis not present

## 2014-09-27 DIAGNOSIS — E119 Type 2 diabetes mellitus without complications: Secondary | ICD-10-CM | POA: Diagnosis not present

## 2014-09-27 DIAGNOSIS — Z7982 Long term (current) use of aspirin: Secondary | ICD-10-CM | POA: Diagnosis not present

## 2014-09-27 DIAGNOSIS — J90 Pleural effusion, not elsewhere classified: Secondary | ICD-10-CM | POA: Diagnosis not present

## 2014-09-27 DIAGNOSIS — H109 Unspecified conjunctivitis: Secondary | ICD-10-CM | POA: Diagnosis not present

## 2014-09-27 DIAGNOSIS — G4730 Sleep apnea, unspecified: Secondary | ICD-10-CM | POA: Diagnosis not present

## 2014-09-27 DIAGNOSIS — S0990XA Unspecified injury of head, initial encounter: Secondary | ICD-10-CM | POA: Diagnosis not present

## 2014-09-27 DIAGNOSIS — R531 Weakness: Secondary | ICD-10-CM | POA: Diagnosis not present

## 2014-09-27 DIAGNOSIS — I119 Hypertensive heart disease without heart failure: Secondary | ICD-10-CM | POA: Diagnosis present

## 2014-09-27 DIAGNOSIS — R404 Transient alteration of awareness: Secondary | ICD-10-CM | POA: Diagnosis not present

## 2014-09-27 DIAGNOSIS — I2510 Atherosclerotic heart disease of native coronary artery without angina pectoris: Secondary | ICD-10-CM | POA: Diagnosis not present

## 2014-09-27 DIAGNOSIS — Z79899 Other long term (current) drug therapy: Secondary | ICD-10-CM | POA: Diagnosis not present

## 2014-09-27 DIAGNOSIS — J9611 Chronic respiratory failure with hypoxia: Secondary | ICD-10-CM | POA: Diagnosis not present

## 2014-09-30 DIAGNOSIS — I10 Essential (primary) hypertension: Secondary | ICD-10-CM | POA: Diagnosis not present

## 2014-09-30 DIAGNOSIS — G4730 Sleep apnea, unspecified: Secondary | ICD-10-CM | POA: Diagnosis not present

## 2014-09-30 DIAGNOSIS — E119 Type 2 diabetes mellitus without complications: Secondary | ICD-10-CM | POA: Diagnosis not present

## 2014-09-30 DIAGNOSIS — E1142 Type 2 diabetes mellitus with diabetic polyneuropathy: Secondary | ICD-10-CM | POA: Diagnosis not present

## 2014-09-30 DIAGNOSIS — M25552 Pain in left hip: Secondary | ICD-10-CM | POA: Diagnosis not present

## 2014-09-30 DIAGNOSIS — I119 Hypertensive heart disease without heart failure: Secondary | ICD-10-CM | POA: Diagnosis not present

## 2014-09-30 DIAGNOSIS — R2681 Unsteadiness on feet: Secondary | ICD-10-CM | POA: Diagnosis not present

## 2014-09-30 DIAGNOSIS — I639 Cerebral infarction, unspecified: Secondary | ICD-10-CM | POA: Diagnosis not present

## 2014-09-30 DIAGNOSIS — E784 Other hyperlipidemia: Secondary | ICD-10-CM | POA: Diagnosis not present

## 2014-09-30 DIAGNOSIS — N390 Urinary tract infection, site not specified: Secondary | ICD-10-CM | POA: Diagnosis not present

## 2014-09-30 DIAGNOSIS — Z9181 History of falling: Secondary | ICD-10-CM | POA: Diagnosis not present

## 2014-09-30 DIAGNOSIS — E785 Hyperlipidemia, unspecified: Secondary | ICD-10-CM | POA: Diagnosis not present

## 2014-09-30 DIAGNOSIS — J9610 Chronic respiratory failure, unspecified whether with hypoxia or hypercapnia: Secondary | ICD-10-CM | POA: Diagnosis not present

## 2014-09-30 DIAGNOSIS — M1990 Unspecified osteoarthritis, unspecified site: Secondary | ICD-10-CM | POA: Diagnosis not present

## 2014-09-30 DIAGNOSIS — R278 Other lack of coordination: Secondary | ICD-10-CM | POA: Diagnosis not present

## 2014-09-30 DIAGNOSIS — M6281 Muscle weakness (generalized): Secondary | ICD-10-CM | POA: Diagnosis not present

## 2014-09-30 DIAGNOSIS — I2510 Atherosclerotic heart disease of native coronary artery without angina pectoris: Secondary | ICD-10-CM | POA: Diagnosis not present

## 2014-09-30 DIAGNOSIS — I69954 Hemiplegia and hemiparesis following unspecified cerebrovascular disease affecting left non-dominant side: Secondary | ICD-10-CM | POA: Diagnosis not present

## 2014-09-30 DIAGNOSIS — I69898 Other sequelae of other cerebrovascular disease: Secondary | ICD-10-CM | POA: Diagnosis not present

## 2014-10-03 DIAGNOSIS — I69898 Other sequelae of other cerebrovascular disease: Secondary | ICD-10-CM | POA: Diagnosis not present

## 2014-10-03 DIAGNOSIS — E785 Hyperlipidemia, unspecified: Secondary | ICD-10-CM | POA: Diagnosis not present

## 2014-10-03 DIAGNOSIS — I10 Essential (primary) hypertension: Secondary | ICD-10-CM | POA: Diagnosis not present

## 2014-10-17 DIAGNOSIS — I69898 Other sequelae of other cerebrovascular disease: Secondary | ICD-10-CM | POA: Diagnosis not present

## 2014-10-17 DIAGNOSIS — E785 Hyperlipidemia, unspecified: Secondary | ICD-10-CM | POA: Diagnosis not present

## 2014-10-17 DIAGNOSIS — I10 Essential (primary) hypertension: Secondary | ICD-10-CM | POA: Diagnosis not present

## 2014-10-21 DIAGNOSIS — E119 Type 2 diabetes mellitus without complications: Secondary | ICD-10-CM | POA: Diagnosis not present

## 2014-10-21 DIAGNOSIS — J441 Chronic obstructive pulmonary disease with (acute) exacerbation: Secondary | ICD-10-CM | POA: Diagnosis not present

## 2014-10-21 DIAGNOSIS — I69354 Hemiplegia and hemiparesis following cerebral infarction affecting left non-dominant side: Secondary | ICD-10-CM | POA: Diagnosis not present

## 2014-10-21 DIAGNOSIS — I10 Essential (primary) hypertension: Secondary | ICD-10-CM | POA: Diagnosis not present

## 2014-10-23 DIAGNOSIS — J441 Chronic obstructive pulmonary disease with (acute) exacerbation: Secondary | ICD-10-CM | POA: Diagnosis not present

## 2014-10-23 DIAGNOSIS — I69354 Hemiplegia and hemiparesis following cerebral infarction affecting left non-dominant side: Secondary | ICD-10-CM | POA: Diagnosis not present

## 2014-10-23 DIAGNOSIS — I10 Essential (primary) hypertension: Secondary | ICD-10-CM | POA: Diagnosis not present

## 2014-10-23 DIAGNOSIS — E119 Type 2 diabetes mellitus without complications: Secondary | ICD-10-CM | POA: Diagnosis not present

## 2014-10-24 DIAGNOSIS — I69354 Hemiplegia and hemiparesis following cerebral infarction affecting left non-dominant side: Secondary | ICD-10-CM | POA: Diagnosis not present

## 2014-10-24 DIAGNOSIS — J309 Allergic rhinitis, unspecified: Secondary | ICD-10-CM | POA: Diagnosis not present

## 2014-10-24 DIAGNOSIS — E782 Mixed hyperlipidemia: Secondary | ICD-10-CM | POA: Diagnosis not present

## 2014-10-24 DIAGNOSIS — I6523 Occlusion and stenosis of bilateral carotid arteries: Secondary | ICD-10-CM | POA: Diagnosis not present

## 2014-10-24 DIAGNOSIS — E119 Type 2 diabetes mellitus without complications: Secondary | ICD-10-CM | POA: Diagnosis not present

## 2014-10-24 DIAGNOSIS — Z79899 Other long term (current) drug therapy: Secondary | ICD-10-CM | POA: Diagnosis not present

## 2014-10-24 DIAGNOSIS — I10 Essential (primary) hypertension: Secondary | ICD-10-CM | POA: Diagnosis not present

## 2014-10-24 DIAGNOSIS — J441 Chronic obstructive pulmonary disease with (acute) exacerbation: Secondary | ICD-10-CM | POA: Diagnosis not present

## 2014-10-24 DIAGNOSIS — Z8673 Personal history of transient ischemic attack (TIA), and cerebral infarction without residual deficits: Secondary | ICD-10-CM | POA: Diagnosis not present

## 2014-10-25 DIAGNOSIS — E119 Type 2 diabetes mellitus without complications: Secondary | ICD-10-CM | POA: Diagnosis not present

## 2014-10-25 DIAGNOSIS — I69354 Hemiplegia and hemiparesis following cerebral infarction affecting left non-dominant side: Secondary | ICD-10-CM | POA: Diagnosis not present

## 2014-10-25 DIAGNOSIS — I10 Essential (primary) hypertension: Secondary | ICD-10-CM | POA: Diagnosis not present

## 2014-10-25 DIAGNOSIS — J441 Chronic obstructive pulmonary disease with (acute) exacerbation: Secondary | ICD-10-CM | POA: Diagnosis not present

## 2014-10-27 DIAGNOSIS — E119 Type 2 diabetes mellitus without complications: Secondary | ICD-10-CM | POA: Diagnosis not present

## 2014-10-27 DIAGNOSIS — I69354 Hemiplegia and hemiparesis following cerebral infarction affecting left non-dominant side: Secondary | ICD-10-CM | POA: Diagnosis not present

## 2014-10-27 DIAGNOSIS — I10 Essential (primary) hypertension: Secondary | ICD-10-CM | POA: Diagnosis not present

## 2014-10-27 DIAGNOSIS — J441 Chronic obstructive pulmonary disease with (acute) exacerbation: Secondary | ICD-10-CM | POA: Diagnosis not present

## 2014-10-30 DIAGNOSIS — J441 Chronic obstructive pulmonary disease with (acute) exacerbation: Secondary | ICD-10-CM | POA: Diagnosis not present

## 2014-10-30 DIAGNOSIS — M545 Low back pain: Secondary | ICD-10-CM | POA: Diagnosis not present

## 2014-10-30 DIAGNOSIS — I10 Essential (primary) hypertension: Secondary | ICD-10-CM | POA: Diagnosis not present

## 2014-10-30 DIAGNOSIS — E119 Type 2 diabetes mellitus without complications: Secondary | ICD-10-CM | POA: Diagnosis not present

## 2014-10-30 DIAGNOSIS — I69354 Hemiplegia and hemiparesis following cerebral infarction affecting left non-dominant side: Secondary | ICD-10-CM | POA: Diagnosis not present

## 2014-10-31 DIAGNOSIS — I69354 Hemiplegia and hemiparesis following cerebral infarction affecting left non-dominant side: Secondary | ICD-10-CM | POA: Diagnosis not present

## 2014-10-31 DIAGNOSIS — I10 Essential (primary) hypertension: Secondary | ICD-10-CM | POA: Diagnosis not present

## 2014-10-31 DIAGNOSIS — E119 Type 2 diabetes mellitus without complications: Secondary | ICD-10-CM | POA: Diagnosis not present

## 2014-10-31 DIAGNOSIS — J441 Chronic obstructive pulmonary disease with (acute) exacerbation: Secondary | ICD-10-CM | POA: Diagnosis not present

## 2014-11-01 DIAGNOSIS — E119 Type 2 diabetes mellitus without complications: Secondary | ICD-10-CM | POA: Diagnosis not present

## 2014-11-01 DIAGNOSIS — I10 Essential (primary) hypertension: Secondary | ICD-10-CM | POA: Diagnosis not present

## 2014-11-01 DIAGNOSIS — I69354 Hemiplegia and hemiparesis following cerebral infarction affecting left non-dominant side: Secondary | ICD-10-CM | POA: Diagnosis not present

## 2014-11-01 DIAGNOSIS — J441 Chronic obstructive pulmonary disease with (acute) exacerbation: Secondary | ICD-10-CM | POA: Diagnosis not present

## 2014-11-02 DIAGNOSIS — J441 Chronic obstructive pulmonary disease with (acute) exacerbation: Secondary | ICD-10-CM | POA: Diagnosis not present

## 2014-11-02 DIAGNOSIS — E119 Type 2 diabetes mellitus without complications: Secondary | ICD-10-CM | POA: Diagnosis not present

## 2014-11-02 DIAGNOSIS — I10 Essential (primary) hypertension: Secondary | ICD-10-CM | POA: Diagnosis not present

## 2014-11-02 DIAGNOSIS — I69354 Hemiplegia and hemiparesis following cerebral infarction affecting left non-dominant side: Secondary | ICD-10-CM | POA: Diagnosis not present

## 2014-11-03 DIAGNOSIS — Z6839 Body mass index (BMI) 39.0-39.9, adult: Secondary | ICD-10-CM | POA: Diagnosis not present

## 2014-11-03 DIAGNOSIS — I69354 Hemiplegia and hemiparesis following cerebral infarction affecting left non-dominant side: Secondary | ICD-10-CM | POA: Diagnosis not present

## 2014-11-03 DIAGNOSIS — E119 Type 2 diabetes mellitus without complications: Secondary | ICD-10-CM | POA: Diagnosis not present

## 2014-11-03 DIAGNOSIS — I10 Essential (primary) hypertension: Secondary | ICD-10-CM | POA: Diagnosis not present

## 2014-11-03 DIAGNOSIS — J441 Chronic obstructive pulmonary disease with (acute) exacerbation: Secondary | ICD-10-CM | POA: Diagnosis not present

## 2014-11-07 DIAGNOSIS — J441 Chronic obstructive pulmonary disease with (acute) exacerbation: Secondary | ICD-10-CM | POA: Diagnosis not present

## 2014-11-07 DIAGNOSIS — E119 Type 2 diabetes mellitus without complications: Secondary | ICD-10-CM | POA: Diagnosis not present

## 2014-11-07 DIAGNOSIS — I69354 Hemiplegia and hemiparesis following cerebral infarction affecting left non-dominant side: Secondary | ICD-10-CM | POA: Diagnosis not present

## 2014-11-07 DIAGNOSIS — I10 Essential (primary) hypertension: Secondary | ICD-10-CM | POA: Diagnosis not present

## 2014-11-08 DIAGNOSIS — I10 Essential (primary) hypertension: Secondary | ICD-10-CM | POA: Diagnosis not present

## 2014-11-08 DIAGNOSIS — E780 Pure hypercholesterolemia: Secondary | ICD-10-CM | POA: Diagnosis not present

## 2014-11-08 DIAGNOSIS — Z8673 Personal history of transient ischemic attack (TIA), and cerebral infarction without residual deficits: Secondary | ICD-10-CM | POA: Diagnosis not present

## 2014-11-08 DIAGNOSIS — R404 Transient alteration of awareness: Secondary | ICD-10-CM | POA: Diagnosis not present

## 2014-11-08 DIAGNOSIS — E119 Type 2 diabetes mellitus without complications: Secondary | ICD-10-CM | POA: Diagnosis not present

## 2014-11-08 DIAGNOSIS — Z720 Tobacco use: Secondary | ICD-10-CM | POA: Diagnosis not present

## 2014-11-08 DIAGNOSIS — Z7982 Long term (current) use of aspirin: Secondary | ICD-10-CM | POA: Diagnosis not present

## 2014-11-09 DIAGNOSIS — I69354 Hemiplegia and hemiparesis following cerebral infarction affecting left non-dominant side: Secondary | ICD-10-CM | POA: Diagnosis not present

## 2014-11-09 DIAGNOSIS — J441 Chronic obstructive pulmonary disease with (acute) exacerbation: Secondary | ICD-10-CM | POA: Diagnosis not present

## 2014-11-09 DIAGNOSIS — I10 Essential (primary) hypertension: Secondary | ICD-10-CM | POA: Diagnosis not present

## 2014-11-09 DIAGNOSIS — E119 Type 2 diabetes mellitus without complications: Secondary | ICD-10-CM | POA: Diagnosis not present

## 2014-11-10 DIAGNOSIS — I10 Essential (primary) hypertension: Secondary | ICD-10-CM | POA: Diagnosis not present

## 2014-11-10 DIAGNOSIS — I69354 Hemiplegia and hemiparesis following cerebral infarction affecting left non-dominant side: Secondary | ICD-10-CM | POA: Diagnosis not present

## 2014-11-10 DIAGNOSIS — J441 Chronic obstructive pulmonary disease with (acute) exacerbation: Secondary | ICD-10-CM | POA: Diagnosis not present

## 2014-11-10 DIAGNOSIS — E119 Type 2 diabetes mellitus without complications: Secondary | ICD-10-CM | POA: Diagnosis not present

## 2014-11-13 DIAGNOSIS — I69354 Hemiplegia and hemiparesis following cerebral infarction affecting left non-dominant side: Secondary | ICD-10-CM | POA: Diagnosis not present

## 2014-11-13 DIAGNOSIS — I10 Essential (primary) hypertension: Secondary | ICD-10-CM | POA: Diagnosis not present

## 2014-11-13 DIAGNOSIS — J441 Chronic obstructive pulmonary disease with (acute) exacerbation: Secondary | ICD-10-CM | POA: Diagnosis not present

## 2014-11-13 DIAGNOSIS — E119 Type 2 diabetes mellitus without complications: Secondary | ICD-10-CM | POA: Diagnosis not present

## 2014-11-14 DIAGNOSIS — E119 Type 2 diabetes mellitus without complications: Secondary | ICD-10-CM | POA: Diagnosis not present

## 2014-11-14 DIAGNOSIS — J441 Chronic obstructive pulmonary disease with (acute) exacerbation: Secondary | ICD-10-CM | POA: Diagnosis not present

## 2014-11-14 DIAGNOSIS — I69354 Hemiplegia and hemiparesis following cerebral infarction affecting left non-dominant side: Secondary | ICD-10-CM | POA: Diagnosis not present

## 2014-11-14 DIAGNOSIS — I10 Essential (primary) hypertension: Secondary | ICD-10-CM | POA: Diagnosis not present

## 2014-11-15 DIAGNOSIS — E119 Type 2 diabetes mellitus without complications: Secondary | ICD-10-CM | POA: Diagnosis not present

## 2014-11-15 DIAGNOSIS — J441 Chronic obstructive pulmonary disease with (acute) exacerbation: Secondary | ICD-10-CM | POA: Diagnosis not present

## 2014-11-15 DIAGNOSIS — I10 Essential (primary) hypertension: Secondary | ICD-10-CM | POA: Diagnosis not present

## 2014-11-15 DIAGNOSIS — I69354 Hemiplegia and hemiparesis following cerebral infarction affecting left non-dominant side: Secondary | ICD-10-CM | POA: Diagnosis not present

## 2014-11-17 DIAGNOSIS — I69354 Hemiplegia and hemiparesis following cerebral infarction affecting left non-dominant side: Secondary | ICD-10-CM | POA: Diagnosis not present

## 2014-11-17 DIAGNOSIS — E119 Type 2 diabetes mellitus without complications: Secondary | ICD-10-CM | POA: Diagnosis not present

## 2014-11-17 DIAGNOSIS — J441 Chronic obstructive pulmonary disease with (acute) exacerbation: Secondary | ICD-10-CM | POA: Diagnosis not present

## 2014-11-17 DIAGNOSIS — I10 Essential (primary) hypertension: Secondary | ICD-10-CM | POA: Diagnosis not present

## 2014-11-23 DIAGNOSIS — E119 Type 2 diabetes mellitus without complications: Secondary | ICD-10-CM | POA: Diagnosis not present

## 2014-11-23 DIAGNOSIS — I69354 Hemiplegia and hemiparesis following cerebral infarction affecting left non-dominant side: Secondary | ICD-10-CM | POA: Diagnosis not present

## 2014-11-23 DIAGNOSIS — J441 Chronic obstructive pulmonary disease with (acute) exacerbation: Secondary | ICD-10-CM | POA: Diagnosis not present

## 2014-11-23 DIAGNOSIS — I10 Essential (primary) hypertension: Secondary | ICD-10-CM | POA: Diagnosis not present

## 2014-11-29 DIAGNOSIS — I69354 Hemiplegia and hemiparesis following cerebral infarction affecting left non-dominant side: Secondary | ICD-10-CM | POA: Diagnosis not present

## 2014-11-29 DIAGNOSIS — E119 Type 2 diabetes mellitus without complications: Secondary | ICD-10-CM | POA: Diagnosis not present

## 2014-11-29 DIAGNOSIS — I10 Essential (primary) hypertension: Secondary | ICD-10-CM | POA: Diagnosis not present

## 2014-11-29 DIAGNOSIS — J441 Chronic obstructive pulmonary disease with (acute) exacerbation: Secondary | ICD-10-CM | POA: Diagnosis not present

## 2014-12-05 DIAGNOSIS — M545 Low back pain: Secondary | ICD-10-CM | POA: Diagnosis not present

## 2014-12-05 DIAGNOSIS — I69354 Hemiplegia and hemiparesis following cerebral infarction affecting left non-dominant side: Secondary | ICD-10-CM | POA: Diagnosis not present

## 2014-12-05 DIAGNOSIS — F418 Other specified anxiety disorders: Secondary | ICD-10-CM | POA: Diagnosis not present

## 2014-12-05 DIAGNOSIS — I10 Essential (primary) hypertension: Secondary | ICD-10-CM | POA: Diagnosis not present

## 2014-12-05 DIAGNOSIS — Z6838 Body mass index (BMI) 38.0-38.9, adult: Secondary | ICD-10-CM | POA: Diagnosis not present

## 2014-12-05 DIAGNOSIS — E782 Mixed hyperlipidemia: Secondary | ICD-10-CM | POA: Diagnosis not present

## 2014-12-05 DIAGNOSIS — E119 Type 2 diabetes mellitus without complications: Secondary | ICD-10-CM | POA: Diagnosis not present

## 2014-12-13 DIAGNOSIS — M5416 Radiculopathy, lumbar region: Secondary | ICD-10-CM | POA: Diagnosis not present

## 2014-12-13 DIAGNOSIS — M9903 Segmental and somatic dysfunction of lumbar region: Secondary | ICD-10-CM | POA: Diagnosis not present

## 2014-12-13 DIAGNOSIS — Z9181 History of falling: Secondary | ICD-10-CM | POA: Diagnosis not present

## 2014-12-13 DIAGNOSIS — M545 Low back pain: Secondary | ICD-10-CM | POA: Diagnosis not present

## 2014-12-13 DIAGNOSIS — Z6838 Body mass index (BMI) 38.0-38.9, adult: Secondary | ICD-10-CM | POA: Diagnosis not present

## 2014-12-21 DIAGNOSIS — M5416 Radiculopathy, lumbar region: Secondary | ICD-10-CM | POA: Diagnosis not present

## 2014-12-21 DIAGNOSIS — M9903 Segmental and somatic dysfunction of lumbar region: Secondary | ICD-10-CM | POA: Diagnosis not present

## 2014-12-25 DIAGNOSIS — M5416 Radiculopathy, lumbar region: Secondary | ICD-10-CM | POA: Diagnosis not present

## 2014-12-25 DIAGNOSIS — M9903 Segmental and somatic dysfunction of lumbar region: Secondary | ICD-10-CM | POA: Diagnosis not present

## 2014-12-28 DIAGNOSIS — M9903 Segmental and somatic dysfunction of lumbar region: Secondary | ICD-10-CM | POA: Diagnosis not present

## 2014-12-28 DIAGNOSIS — M5416 Radiculopathy, lumbar region: Secondary | ICD-10-CM | POA: Diagnosis not present

## 2015-01-01 DIAGNOSIS — M5416 Radiculopathy, lumbar region: Secondary | ICD-10-CM | POA: Diagnosis not present

## 2015-01-01 DIAGNOSIS — M9903 Segmental and somatic dysfunction of lumbar region: Secondary | ICD-10-CM | POA: Diagnosis not present

## 2015-01-03 DIAGNOSIS — R5383 Other fatigue: Secondary | ICD-10-CM | POA: Diagnosis not present

## 2015-01-03 DIAGNOSIS — G4733 Obstructive sleep apnea (adult) (pediatric): Secondary | ICD-10-CM | POA: Diagnosis not present

## 2015-01-03 DIAGNOSIS — J310 Chronic rhinitis: Secondary | ICD-10-CM | POA: Diagnosis not present

## 2015-01-03 DIAGNOSIS — J4520 Mild intermittent asthma, uncomplicated: Secondary | ICD-10-CM | POA: Diagnosis not present

## 2015-01-04 DIAGNOSIS — M9903 Segmental and somatic dysfunction of lumbar region: Secondary | ICD-10-CM | POA: Diagnosis not present

## 2015-01-04 DIAGNOSIS — M5416 Radiculopathy, lumbar region: Secondary | ICD-10-CM | POA: Diagnosis not present

## 2015-01-08 DIAGNOSIS — M9903 Segmental and somatic dysfunction of lumbar region: Secondary | ICD-10-CM | POA: Diagnosis not present

## 2015-01-08 DIAGNOSIS — M5416 Radiculopathy, lumbar region: Secondary | ICD-10-CM | POA: Diagnosis not present

## 2015-01-11 DIAGNOSIS — M5416 Radiculopathy, lumbar region: Secondary | ICD-10-CM | POA: Diagnosis not present

## 2015-01-11 DIAGNOSIS — M9903 Segmental and somatic dysfunction of lumbar region: Secondary | ICD-10-CM | POA: Diagnosis not present

## 2015-01-16 DIAGNOSIS — M5416 Radiculopathy, lumbar region: Secondary | ICD-10-CM | POA: Diagnosis not present

## 2015-01-16 DIAGNOSIS — M9903 Segmental and somatic dysfunction of lumbar region: Secondary | ICD-10-CM | POA: Diagnosis not present

## 2015-01-18 DIAGNOSIS — M5416 Radiculopathy, lumbar region: Secondary | ICD-10-CM | POA: Diagnosis not present

## 2015-01-18 DIAGNOSIS — M9903 Segmental and somatic dysfunction of lumbar region: Secondary | ICD-10-CM | POA: Diagnosis not present

## 2015-01-23 DIAGNOSIS — M5416 Radiculopathy, lumbar region: Secondary | ICD-10-CM | POA: Diagnosis not present

## 2015-01-23 DIAGNOSIS — M9903 Segmental and somatic dysfunction of lumbar region: Secondary | ICD-10-CM | POA: Diagnosis not present

## 2015-01-25 DIAGNOSIS — M5416 Radiculopathy, lumbar region: Secondary | ICD-10-CM | POA: Diagnosis not present

## 2015-01-25 DIAGNOSIS — M9903 Segmental and somatic dysfunction of lumbar region: Secondary | ICD-10-CM | POA: Diagnosis not present

## 2015-01-29 DIAGNOSIS — M9903 Segmental and somatic dysfunction of lumbar region: Secondary | ICD-10-CM | POA: Diagnosis not present

## 2015-01-29 DIAGNOSIS — R0789 Other chest pain: Secondary | ICD-10-CM | POA: Diagnosis not present

## 2015-01-29 DIAGNOSIS — Z789 Other specified health status: Secondary | ICD-10-CM | POA: Diagnosis not present

## 2015-01-29 DIAGNOSIS — M5416 Radiculopathy, lumbar region: Secondary | ICD-10-CM | POA: Diagnosis not present

## 2015-01-29 DIAGNOSIS — Z6837 Body mass index (BMI) 37.0-37.9, adult: Secondary | ICD-10-CM | POA: Diagnosis not present

## 2015-02-08 DIAGNOSIS — M5416 Radiculopathy, lumbar region: Secondary | ICD-10-CM | POA: Diagnosis not present

## 2015-02-08 DIAGNOSIS — M9903 Segmental and somatic dysfunction of lumbar region: Secondary | ICD-10-CM | POA: Diagnosis not present

## 2015-02-15 DIAGNOSIS — M5416 Radiculopathy, lumbar region: Secondary | ICD-10-CM | POA: Diagnosis not present

## 2015-02-15 DIAGNOSIS — M9903 Segmental and somatic dysfunction of lumbar region: Secondary | ICD-10-CM | POA: Diagnosis not present

## 2015-02-19 DIAGNOSIS — M9903 Segmental and somatic dysfunction of lumbar region: Secondary | ICD-10-CM | POA: Diagnosis not present

## 2015-02-19 DIAGNOSIS — M5416 Radiculopathy, lumbar region: Secondary | ICD-10-CM | POA: Diagnosis not present

## 2015-02-21 DIAGNOSIS — M5416 Radiculopathy, lumbar region: Secondary | ICD-10-CM | POA: Diagnosis not present

## 2015-02-21 DIAGNOSIS — M9903 Segmental and somatic dysfunction of lumbar region: Secondary | ICD-10-CM | POA: Diagnosis not present

## 2015-02-23 DIAGNOSIS — Z6837 Body mass index (BMI) 37.0-37.9, adult: Secondary | ICD-10-CM | POA: Diagnosis not present

## 2015-02-23 DIAGNOSIS — I83819 Varicose veins of unspecified lower extremities with pain: Secondary | ICD-10-CM | POA: Diagnosis not present

## 2015-02-26 DIAGNOSIS — M5416 Radiculopathy, lumbar region: Secondary | ICD-10-CM | POA: Diagnosis not present

## 2015-02-26 DIAGNOSIS — M9903 Segmental and somatic dysfunction of lumbar region: Secondary | ICD-10-CM | POA: Diagnosis not present

## 2015-02-27 ENCOUNTER — Other Ambulatory Visit: Payer: Self-pay | Admitting: *Deleted

## 2015-02-27 DIAGNOSIS — I83893 Varicose veins of bilateral lower extremities with other complications: Secondary | ICD-10-CM

## 2015-03-01 DIAGNOSIS — M5416 Radiculopathy, lumbar region: Secondary | ICD-10-CM | POA: Diagnosis not present

## 2015-03-01 DIAGNOSIS — M9903 Segmental and somatic dysfunction of lumbar region: Secondary | ICD-10-CM | POA: Diagnosis not present

## 2015-03-05 DIAGNOSIS — M79605 Pain in left leg: Secondary | ICD-10-CM | POA: Diagnosis not present

## 2015-03-05 DIAGNOSIS — J449 Chronic obstructive pulmonary disease, unspecified: Secondary | ICD-10-CM | POA: Diagnosis not present

## 2015-03-05 DIAGNOSIS — I83819 Varicose veins of unspecified lower extremities with pain: Secondary | ICD-10-CM | POA: Diagnosis not present

## 2015-03-05 DIAGNOSIS — I10 Essential (primary) hypertension: Secondary | ICD-10-CM | POA: Diagnosis not present

## 2015-03-05 DIAGNOSIS — E119 Type 2 diabetes mellitus without complications: Secondary | ICD-10-CM | POA: Diagnosis not present

## 2015-03-05 DIAGNOSIS — Z6837 Body mass index (BMI) 37.0-37.9, adult: Secondary | ICD-10-CM | POA: Diagnosis not present

## 2015-03-05 DIAGNOSIS — E782 Mixed hyperlipidemia: Secondary | ICD-10-CM | POA: Diagnosis not present

## 2015-03-06 DIAGNOSIS — M25562 Pain in left knee: Secondary | ICD-10-CM | POA: Diagnosis not present

## 2015-03-06 DIAGNOSIS — I83819 Varicose veins of unspecified lower extremities with pain: Secondary | ICD-10-CM | POA: Diagnosis not present

## 2015-03-08 DIAGNOSIS — M5416 Radiculopathy, lumbar region: Secondary | ICD-10-CM | POA: Diagnosis not present

## 2015-03-08 DIAGNOSIS — M9903 Segmental and somatic dysfunction of lumbar region: Secondary | ICD-10-CM | POA: Diagnosis not present

## 2015-03-15 DIAGNOSIS — I83813 Varicose veins of bilateral lower extremities with pain: Secondary | ICD-10-CM | POA: Diagnosis not present

## 2015-03-15 DIAGNOSIS — L490 Exfoliation due to erythematous condition involving less than 10 percent of body surface: Secondary | ICD-10-CM | POA: Diagnosis not present

## 2015-03-15 DIAGNOSIS — R600 Localized edema: Secondary | ICD-10-CM | POA: Diagnosis not present

## 2015-03-27 DIAGNOSIS — R600 Localized edema: Secondary | ICD-10-CM | POA: Diagnosis not present

## 2015-03-27 DIAGNOSIS — L490 Exfoliation due to erythematous condition involving less than 10 percent of body surface: Secondary | ICD-10-CM | POA: Diagnosis not present

## 2015-03-27 DIAGNOSIS — I83813 Varicose veins of bilateral lower extremities with pain: Secondary | ICD-10-CM | POA: Diagnosis not present

## 2015-03-28 DIAGNOSIS — M9903 Segmental and somatic dysfunction of lumbar region: Secondary | ICD-10-CM | POA: Diagnosis not present

## 2015-03-28 DIAGNOSIS — M5416 Radiculopathy, lumbar region: Secondary | ICD-10-CM | POA: Diagnosis not present

## 2015-03-29 DIAGNOSIS — I83813 Varicose veins of bilateral lower extremities with pain: Secondary | ICD-10-CM | POA: Diagnosis not present

## 2015-03-29 DIAGNOSIS — R600 Localized edema: Secondary | ICD-10-CM | POA: Diagnosis not present

## 2015-03-29 DIAGNOSIS — L490 Exfoliation due to erythematous condition involving less than 10 percent of body surface: Secondary | ICD-10-CM | POA: Diagnosis not present

## 2015-04-06 DIAGNOSIS — Z720 Tobacco use: Secondary | ICD-10-CM | POA: Diagnosis not present

## 2015-04-06 DIAGNOSIS — J4520 Mild intermittent asthma, uncomplicated: Secondary | ICD-10-CM | POA: Diagnosis not present

## 2015-04-06 DIAGNOSIS — G4733 Obstructive sleep apnea (adult) (pediatric): Secondary | ICD-10-CM | POA: Diagnosis not present

## 2015-04-06 DIAGNOSIS — D860 Sarcoidosis of lung: Secondary | ICD-10-CM | POA: Diagnosis not present

## 2015-04-23 DIAGNOSIS — M79606 Pain in leg, unspecified: Secondary | ICD-10-CM | POA: Diagnosis not present

## 2015-04-23 DIAGNOSIS — I8390 Asymptomatic varicose veins of unspecified lower extremity: Secondary | ICD-10-CM | POA: Diagnosis not present

## 2015-04-23 DIAGNOSIS — I872 Venous insufficiency (chronic) (peripheral): Secondary | ICD-10-CM | POA: Diagnosis not present

## 2015-04-23 DIAGNOSIS — E119 Type 2 diabetes mellitus without complications: Secondary | ICD-10-CM | POA: Diagnosis not present

## 2015-04-27 DIAGNOSIS — R531 Weakness: Secondary | ICD-10-CM | POA: Diagnosis not present

## 2015-04-27 DIAGNOSIS — M79605 Pain in left leg: Secondary | ICD-10-CM | POA: Diagnosis not present

## 2015-04-27 DIAGNOSIS — M25561 Pain in right knee: Secondary | ICD-10-CM | POA: Diagnosis not present

## 2015-04-27 DIAGNOSIS — R404 Transient alteration of awareness: Secondary | ICD-10-CM | POA: Diagnosis not present

## 2015-04-30 DIAGNOSIS — J45909 Unspecified asthma, uncomplicated: Secondary | ICD-10-CM | POA: Diagnosis not present

## 2015-04-30 DIAGNOSIS — Z8673 Personal history of transient ischemic attack (TIA), and cerebral infarction without residual deficits: Secondary | ICD-10-CM | POA: Diagnosis not present

## 2015-04-30 DIAGNOSIS — Z7984 Long term (current) use of oral hypoglycemic drugs: Secondary | ICD-10-CM | POA: Diagnosis not present

## 2015-04-30 DIAGNOSIS — G4730 Sleep apnea, unspecified: Secondary | ICD-10-CM | POA: Diagnosis not present

## 2015-04-30 DIAGNOSIS — I83812 Varicose veins of left lower extremities with pain: Secondary | ICD-10-CM | POA: Diagnosis not present

## 2015-04-30 DIAGNOSIS — F17210 Nicotine dependence, cigarettes, uncomplicated: Secondary | ICD-10-CM | POA: Diagnosis not present

## 2015-04-30 DIAGNOSIS — J439 Emphysema, unspecified: Secondary | ICD-10-CM | POA: Diagnosis not present

## 2015-04-30 DIAGNOSIS — E119 Type 2 diabetes mellitus without complications: Secondary | ICD-10-CM | POA: Diagnosis not present

## 2015-04-30 DIAGNOSIS — I10 Essential (primary) hypertension: Secondary | ICD-10-CM | POA: Diagnosis not present

## 2015-04-30 DIAGNOSIS — I872 Venous insufficiency (chronic) (peripheral): Secondary | ICD-10-CM | POA: Diagnosis not present

## 2015-04-30 DIAGNOSIS — I8390 Asymptomatic varicose veins of unspecified lower extremity: Secondary | ICD-10-CM | POA: Diagnosis not present

## 2015-04-30 DIAGNOSIS — I83892 Varicose veins of left lower extremities with other complications: Secondary | ICD-10-CM | POA: Diagnosis not present

## 2015-05-07 DIAGNOSIS — I8390 Asymptomatic varicose veins of unspecified lower extremity: Secondary | ICD-10-CM | POA: Diagnosis not present

## 2015-05-07 DIAGNOSIS — Z9981 Dependence on supplemental oxygen: Secondary | ICD-10-CM | POA: Diagnosis not present

## 2015-05-07 DIAGNOSIS — G4733 Obstructive sleep apnea (adult) (pediatric): Secondary | ICD-10-CM | POA: Diagnosis not present

## 2015-05-07 DIAGNOSIS — Z7984 Long term (current) use of oral hypoglycemic drugs: Secondary | ICD-10-CM | POA: Diagnosis not present

## 2015-05-07 DIAGNOSIS — E119 Type 2 diabetes mellitus without complications: Secondary | ICD-10-CM | POA: Diagnosis not present

## 2015-05-07 DIAGNOSIS — J449 Chronic obstructive pulmonary disease, unspecified: Secondary | ICD-10-CM | POA: Diagnosis not present

## 2015-05-07 DIAGNOSIS — I8391 Asymptomatic varicose veins of right lower extremity: Secondary | ICD-10-CM | POA: Diagnosis not present

## 2015-05-07 DIAGNOSIS — I10 Essential (primary) hypertension: Secondary | ICD-10-CM | POA: Diagnosis not present

## 2015-05-07 DIAGNOSIS — E785 Hyperlipidemia, unspecified: Secondary | ICD-10-CM | POA: Diagnosis not present

## 2015-05-07 DIAGNOSIS — I83891 Varicose veins of right lower extremities with other complications: Secondary | ICD-10-CM | POA: Diagnosis not present

## 2015-05-07 DIAGNOSIS — I872 Venous insufficiency (chronic) (peripheral): Secondary | ICD-10-CM | POA: Diagnosis not present

## 2015-05-07 DIAGNOSIS — Z7982 Long term (current) use of aspirin: Secondary | ICD-10-CM | POA: Diagnosis not present

## 2015-05-07 DIAGNOSIS — Z79899 Other long term (current) drug therapy: Secondary | ICD-10-CM | POA: Diagnosis not present

## 2015-05-07 DIAGNOSIS — I739 Peripheral vascular disease, unspecified: Secondary | ICD-10-CM | POA: Diagnosis not present

## 2015-05-07 DIAGNOSIS — J45909 Unspecified asthma, uncomplicated: Secondary | ICD-10-CM | POA: Diagnosis not present

## 2015-05-17 ENCOUNTER — Encounter: Payer: Self-pay | Admitting: Vascular Surgery

## 2015-05-22 ENCOUNTER — Encounter: Payer: Medicare Other | Admitting: Vascular Surgery

## 2015-05-22 ENCOUNTER — Inpatient Hospital Stay (HOSPITAL_COMMUNITY)
Admission: RE | Admit: 2015-05-22 | Discharge: 2015-05-22 | Disposition: A | Discharge status: Home / Self Care | Payer: Medicare Other | Source: Ambulatory Visit

## 2015-05-22 DIAGNOSIS — I83893 Varicose veins of bilateral lower extremities with other complications: Secondary | ICD-10-CM

## 2015-05-24 DIAGNOSIS — R1013 Epigastric pain: Secondary | ICD-10-CM | POA: Diagnosis not present

## 2015-05-24 DIAGNOSIS — R634 Abnormal weight loss: Secondary | ICD-10-CM | POA: Diagnosis not present

## 2015-05-24 DIAGNOSIS — F418 Other specified anxiety disorders: Secondary | ICD-10-CM | POA: Diagnosis not present

## 2015-05-24 DIAGNOSIS — K5900 Constipation, unspecified: Secondary | ICD-10-CM | POA: Diagnosis not present

## 2015-05-24 DIAGNOSIS — I10 Essential (primary) hypertension: Secondary | ICD-10-CM | POA: Diagnosis not present

## 2015-05-28 DIAGNOSIS — K5900 Constipation, unspecified: Secondary | ICD-10-CM | POA: Diagnosis not present

## 2015-05-29 DIAGNOSIS — R0602 Shortness of breath: Secondary | ICD-10-CM | POA: Diagnosis not present

## 2015-05-29 DIAGNOSIS — K5900 Constipation, unspecified: Secondary | ICD-10-CM | POA: Diagnosis not present

## 2015-05-29 DIAGNOSIS — R1010 Upper abdominal pain, unspecified: Secondary | ICD-10-CM | POA: Diagnosis not present

## 2015-05-29 DIAGNOSIS — K5909 Other constipation: Secondary | ICD-10-CM | POA: Diagnosis not present

## 2015-05-31 ENCOUNTER — Emergency Department (HOSPITAL_COMMUNITY)
Admission: EM | Admit: 2015-05-31 | Discharge: 2015-05-31 | Disposition: A | Discharge status: Home / Self Care | Payer: Medicare Other | Attending: Emergency Medicine | Admitting: Emergency Medicine

## 2015-05-31 ENCOUNTER — Ambulatory Visit (HOSPITAL_COMMUNITY): Admission: RE | Admit: 2015-05-31 | Payer: Medicare Other | Source: Ambulatory Visit

## 2015-05-31 ENCOUNTER — Emergency Department (HOSPITAL_COMMUNITY): Payer: Medicare Other

## 2015-05-31 ENCOUNTER — Encounter (HOSPITAL_COMMUNITY): Payer: Self-pay | Admitting: *Deleted

## 2015-05-31 DIAGNOSIS — R1013 Epigastric pain: Secondary | ICD-10-CM | POA: Diagnosis not present

## 2015-05-31 DIAGNOSIS — E119 Type 2 diabetes mellitus without complications: Secondary | ICD-10-CM | POA: Insufficient documentation

## 2015-05-31 DIAGNOSIS — R110 Nausea: Secondary | ICD-10-CM | POA: Insufficient documentation

## 2015-05-31 DIAGNOSIS — Z79899 Other long term (current) drug therapy: Secondary | ICD-10-CM | POA: Insufficient documentation

## 2015-05-31 DIAGNOSIS — I10 Essential (primary) hypertension: Secondary | ICD-10-CM | POA: Insufficient documentation

## 2015-05-31 DIAGNOSIS — J449 Chronic obstructive pulmonary disease, unspecified: Secondary | ICD-10-CM | POA: Diagnosis not present

## 2015-05-31 DIAGNOSIS — Z8673 Personal history of transient ischemic attack (TIA), and cerebral infarction without residual deficits: Secondary | ICD-10-CM | POA: Insufficient documentation

## 2015-05-31 DIAGNOSIS — Z87891 Personal history of nicotine dependence: Secondary | ICD-10-CM | POA: Diagnosis not present

## 2015-05-31 DIAGNOSIS — R140 Abdominal distension (gaseous): Secondary | ICD-10-CM

## 2015-05-31 DIAGNOSIS — Z7982 Long term (current) use of aspirin: Secondary | ICD-10-CM | POA: Insufficient documentation

## 2015-05-31 DIAGNOSIS — Z8639 Personal history of other endocrine, nutritional and metabolic disease: Secondary | ICD-10-CM | POA: Insufficient documentation

## 2015-05-31 LAB — COMPREHENSIVE METABOLIC PANEL
ALBUMIN: 3.8 g/dL (ref 3.5–5.0)
ALK PHOS: 93 U/L (ref 38–126)
ALT: 32 U/L (ref 14–54)
AST: 65 U/L — AB (ref 15–41)
Anion gap: 12 (ref 5–15)
BUN: 8 mg/dL (ref 6–20)
CALCIUM: 9.6 mg/dL (ref 8.9–10.3)
CO2: 24 mmol/L (ref 22–32)
CREATININE: 0.75 mg/dL (ref 0.44–1.00)
Chloride: 100 mmol/L — ABNORMAL LOW (ref 101–111)
GFR calc Af Amer: >60 mL/min (ref >60)
GFR calc non Af Amer: >60 mL/min (ref >60)
GLUCOSE: 272 mg/dL — AB (ref 65–99)
Potassium: 2.8 mmol/L — ABNORMAL LOW (ref 3.5–5.1)
SODIUM: 136 mmol/L (ref 135–145)
Total Bilirubin: 0.6 mg/dL (ref 0.3–1.2)
Total Protein: 7 g/dL (ref 6.5–8.1)

## 2015-05-31 LAB — CBC
HCT: 37.1 % (ref 36.0–46.0)
Hemoglobin: 13 g/dL (ref 12.0–15.0)
MCH: 30.6 pg (ref 26.0–34.0)
MCHC: 35 g/dL (ref 30.0–36.0)
MCV: 87.3 fL (ref 78.0–100.0)
PLATELETS: 257 10*3/uL (ref 150–400)
RBC: 4.25 MIL/uL (ref 3.87–5.11)
RDW: 12.7 % (ref 11.5–15.5)
WBC: 8.3 10*3/uL (ref 4.0–10.5)

## 2015-05-31 LAB — LIPASE, BLOOD: Lipase: 32 U/L (ref 11–51)

## 2015-05-31 LAB — URINE MICROSCOPIC-ADD ON

## 2015-05-31 LAB — URINALYSIS, ROUTINE W REFLEX MICROSCOPIC
Bilirubin Urine: NEGATIVE
HGB URINE DIPSTICK: NEGATIVE
KETONES UR: NEGATIVE mg/dL
Leukocytes, UA: NEGATIVE
Nitrite: NEGATIVE
PH: 7.5 (ref 5.0–8.0)
PROTEIN: NEGATIVE mg/dL
Specific Gravity, Urine: 1.019 (ref 1.005–1.030)
Urobilinogen, UA: 0.2 mg/dL (ref 0.0–1.0)

## 2015-05-31 MED ORDER — FENTANYL CITRATE (PF) 100 MCG/2ML IJ SOLN
100.0000 ug | Freq: Once | INTRAMUSCULAR | Status: AC
  Administered 2015-05-31: 100 ug via INTRAVENOUS
  Filled 2015-05-31: qty 2

## 2015-05-31 MED ORDER — POTASSIUM CHLORIDE CRYS ER 20 MEQ PO TBCR
40.0000 meq | EXTENDED_RELEASE_TABLET | Freq: Once | ORAL | Status: AC
  Administered 2015-05-31: 40 meq via ORAL
  Filled 2015-05-31: qty 2

## 2015-05-31 MED ORDER — RANITIDINE HCL 150 MG/10ML PO SYRP
150.0000 mg | ORAL_SOLUTION | Freq: Once | ORAL | Status: AC
  Administered 2015-05-31: 150 mg via ORAL
  Filled 2015-05-31: qty 10

## 2015-05-31 MED ORDER — POTASSIUM CHLORIDE CRYS ER 20 MEQ PO TBCR: 40.0000 meq | EXTENDED_RELEASE_TABLET | Freq: Two times a day (BID) | ORAL | Status: DC

## 2015-05-31 MED ORDER — GI COCKTAIL ~~LOC~~
30.0000 mL | Freq: Once | ORAL | Status: AC
  Administered 2015-05-31: 30 mL via ORAL
  Filled 2015-05-31: qty 30

## 2015-05-31 MED ORDER — ONDANSETRON 4 MG PO TBDP
8.0000 mg | ORAL_TABLET | Freq: Once | ORAL | Status: AC
  Administered 2015-05-31: 8 mg via ORAL
  Filled 2015-05-31: qty 2

## 2015-05-31 MED ORDER — SODIUM CHLORIDE 0.9 % IV BOLUS (SEPSIS)
1000.0000 mL | Freq: Once | INTRAVENOUS | Status: AC
  Administered 2015-05-31: 1000 mL via INTRAVENOUS

## 2015-05-31 MED ORDER — ONDANSETRON 4 MG PO TBDP: ORAL_TABLET | ORAL | Status: DC

## 2015-05-31 MED ORDER — RANITIDINE HCL 150 MG PO TABS: 150.0000 mg | ORAL_TABLET | Freq: Two times a day (BID) | ORAL | Status: DC

## 2015-05-31 NOTE — ED Notes (Signed)
Pt reports abdominal pain and vomiting for 2 weeks. Pt reports decreased appetite. Pt was seen in [place] yesterday and had chest xray and ct scan yesterday.

## 2015-05-31 NOTE — ED Provider Notes (Signed)
CSN: [id number]     Arrival date & time 05/31/15  1229 History   First MD Initiated Contact with Patient 05/31/15 1313     Chief Complaint  Patient presents with  . Abdominal Pain     (Consider location/radiation/quality/duration/timing/severity/associated sxs/prior Treatment) Patient is a 69 y.o. female presenting with abdominal pain.  Abdominal Pain Pain location:  Epigastric Pain quality: aching, fullness, pressure and sharp   Pain radiates to:  Does not radiate Pain severity:  Mild Onset quality:  Gradual Duration:  5 weeks Timing:  Intermittent Progression:  Waxing and waning Chronicity:  New Context: not alcohol use, not medication withdrawal and not previous surgeries   Relieved by:  None tried Worsened by:  Nothing tried Ineffective treatments:  None tried Associated symptoms: nausea   Associated symptoms: no anorexia, no chills, no constipation, no cough, no dysuria, no fatigue, no fever, no shortness of breath and no vomiting   Risk factors: no alcohol abuse   a  Past Medical History  Diagnosis Date  . Diabetes mellitus   . Hypertension   . High cholesterol   . COPD (chronic obstructive pulmonary disease) ([place])   . Stroke [facility])    Past Surgical History  Procedure Laterality Date  . Neck surgery    . Back surgery    . Cholecystectomy     Family History  Problem Relation Age of Onset  . Liver cancer Father    Social History  Substance Use Topics  . Smoking status: Former Smoker -- 0.50 packs/day for 22 years    Types: Cigarettes    Quit date: 04/17/2013  . Smokeless tobacco: Never Used  . Alcohol Use: No   OB History    No data available     Review of Systems  Constitutional: Negative for fever, chills and fatigue.  Respiratory: Negative for cough and shortness of breath.   Gastrointestinal: Positive for nausea, abdominal pain and abdominal distention. Negative for vomiting, constipation and anorexia.  Genitourinary: Negative for dysuria.  All  other systems reviewed and are negative.     Allergies  Codeine; Mometasone furoate; and Sulfa antibiotics  Home Medications   Prior to Admission medications   Medication Sig Start Date End Date Taking? Authorizing Provider  albuterol (PROVENTIL) (2.5 MG/3ML) 0.083% nebulizer solution Take 2.5 mg by nebulization 3 (three) times daily as needed for wheezing or shortness of breath.   Yes Historical Provider, MD  ALPRAZolam Duanne Moron) 0.5 MG tablet Take 0.5-1 mg by mouth daily as needed. For anxiety.   Yes Historical Provider, MD  aspirin EC 81 MG tablet Take 81 mg by mouth daily.   Yes Historical Provider, MD  azelastine (ASTELIN) 137 MCG/SPRAY nasal spray Place 2 sprays into both nostrils daily as needed for allergies. Use in each nostril as directed   Yes Historical Provider, MD  bisacodyl (DULCOLAX) 10 MG suppository Place 10 mg rectally daily as needed for mild constipation or moderate constipation.   Yes Historical Provider, MD  citalopram (CELEXA) 20 MG tablet Take 20 mg by mouth daily.   Yes Historical Provider, MD  DEXILANT 60 MG capsule Take 1 capsule by mouth daily. 04/23/15  Yes Historical Provider, MD  esomeprazole (NEXIUM) 40 MG capsule Take 40 mg by mouth 2 (two) times daily.     Yes Historical Provider, MD  fenofibrate (TRICOR) 145 MG tablet Take 145 mg by mouth at bedtime.    Yes Historical Provider, MD  HYDROcodone-acetaminophen (NORCO/VICODIN) 5-325 MG tablet Take 1 tablet by mouth every  6 (six) hours as needed for moderate pain.   Yes Historical Provider, MD  ibuprofen (ADVIL,MOTRIN) 200 MG tablet Take 600 mg by mouth every 6 (six) hours as needed for moderate pain.   Yes Historical Provider, MD  loratadine (CLARITIN) 10 MG tablet Take 10 mg by mouth daily as needed for allergies.    Yes Historical Provider, MD  losartan (COZAAR) 100 MG tablet Take 100 mg by mouth daily.   Yes Historical Provider, MD  metFORMIN (GLUCOPHAGE) 500 MG tablet Take 1,000 mg by mouth 2 (two) times  daily with a meal.   Yes Historical Provider, MD  metoprolol (TOPROL-XL) 50 MG 24 hr tablet Take 50 mg by mouth daily.     Yes Historical Provider, MD  olmesartan-hydrochlorothiazide (BENICAR HCT) 20-12.5 MG per tablet Take 1 tablet by mouth daily. 06/23/13  Yes Tanda Rockers, MD  pioglitazone (ACTOS) 30 MG tablet Take 30 mg by mouth daily.   Yes Historical Provider, MD  promethazine (PHENERGAN) 12.5 MG tablet Take 12.5 mg by mouth 3 (three) times daily as needed for nausea or vomiting.   Yes Historical Provider, MD  sitaGLIPtin (JANUVIA) 100 MG tablet Take 100 mg by mouth daily.   Yes Historical Provider, MD  tiZANidine (ZANAFLEX) 4 MG tablet Take 4 mg by mouth 3 (three) times daily as needed for muscle spasms.    Yes Historical Provider, MD  traMADol (ULTRAM) 50 MG tablet Take 1 tablet by mouth every 6 (six) hours as needed for moderate pain.  04/11/13  Yes Historical Provider, MD  VENTOLIN HFA 108 (90 BASE) MCG/ACT inhaler Inhale 2 puffs into the lungs every 6 (six) hours as needed. wheezing 04/10/15  Yes Historical Provider, MD  ondansetron (ZOFRAN ODT) 4 MG disintegrating tablet '4mg'$  ODT q4 hours prn nausea/vomit 05/31/15   Merrily Pew, MD  potassium chloride SA (K-DUR,KLOR-CON) 20 MEQ tablet Take 2 tablets (40 mEq total) by mouth 2 (two) times daily. 05/31/15   Merrily Pew, MD  ranitidine (ZANTAC) 150 MG tablet Take 1 tablet (150 mg total) by mouth 2 (two) times daily. 05/31/15   Nicky Milhouse, MD   BP 152/86 mmHg  Pulse 71  Temp(Src) 99 F (37.2 C) (Oral)  Resp 18  SpO2 98% Physical Exam  Constitutional: She is oriented to person, place, and time. She appears well-developed and well-nourished.  HENT:  Head: Normocephalic and atraumatic.  Eyes: Conjunctivae are normal. Pupils are equal, round, and reactive to light.  Neck: Normal range of motion.  Cardiovascular: Normal rate and regular rhythm.   Pulmonary/Chest: Effort normal and breath sounds normal. No stridor. No respiratory  distress.  Abdominal: Soft. Bowel sounds are normal. She exhibits distension. There is no tenderness. There is no rebound and no guarding.  Neurological: She is alert and oriented to person, place, and time. No cranial nerve deficit.  Skin: Skin is warm and dry.  Nursing note and vitals reviewed.   ED Course  Procedures (including critical care time) Labs Review Labs Reviewed  COMPREHENSIVE METABOLIC PANEL - Abnormal; Notable for the following:    Potassium 2.8 (*)    Chloride 100 (*)    Glucose, Bld 272 (*)    AST 65 (*)    All other components within normal limits  URINALYSIS, ROUTINE W REFLEX MICROSCOPIC (NOT AT [facility]) - Abnormal; Notable for the following:    Glucose, UA >1000 (*)    All other components within normal limits  URINE MICROSCOPIC-ADD ON - Abnormal; Notable for the following:  Squamous Epithelial / LPF FEW (*)    Bacteria, UA FEW (*)    All other components within normal limits  LIPASE, BLOOD  CBC    Imaging Review No results found. I have personally reviewed and evaluated these images and lab results as part of my medical decision-making.   EKG Interpretation None      MDM   Final diagnoses:  Epigastric abdominal pain  Abdominal distension   Seen at [facility] 2 days prior for the exact same symptoms have not improved since that time but has no new symptoms. She has epigastric pain worse with eating also with distention and gas. They told her that she was "backed up" and so took a bunch of MiraLAX and suppositories and sent home. I discussed with the radiologist, the CT scan showed and he said it showed minimal stool burden without any evidence of obstruction. Initially plan on repeating CT scan here however with our radiologist look at their scans I feel like there is no new symptoms to require new CT scan. Discussed with the patient that she was likely taken too much MiraLAX that she was on poly-lax from the drugstore plus MiraLAX as a  prescription from her doctor and then the emergency department also had her do a GoLYTELY bowel prep. Affect his symptoms are likely secondary to gas from overuse of these osmotic 80s. She also has hypokalemia which is not helping matters and is likely also secondary to these agents. I doubt patient has any significant intra-abdominal pathology at this time. Feel she is stable for discharge home.  I have personally and contemperaneously reviewed labs and imaging and used in my decision making as above.   A medical screening exam was performed and I feel the patient has had an appropriate workup for their chief complaint at this time and likelihood of emergent condition existing is low. They have been counseled on decision, discharge, follow up and which symptoms necessitate immediate return to the emergency department. They or their family verbally stated understanding and agreement with plan and discharged in stable condition.      Merrily Pew, MD 05/31/15 [phone]

## 2015-05-31 NOTE — ED Notes (Signed)
Patient transported to CT 

## 2015-06-01 DIAGNOSIS — E876 Hypokalemia: Secondary | ICD-10-CM | POA: Diagnosis not present

## 2015-06-01 DIAGNOSIS — R109 Unspecified abdominal pain: Secondary | ICD-10-CM | POA: Diagnosis not present

## 2015-06-01 DIAGNOSIS — Z23 Encounter for immunization: Secondary | ICD-10-CM | POA: Diagnosis not present

## 2015-06-05 DIAGNOSIS — E876 Hypokalemia: Secondary | ICD-10-CM | POA: Diagnosis not present

## 2015-06-05 DIAGNOSIS — Z6833 Body mass index (BMI) 33.0-33.9, adult: Secondary | ICD-10-CM | POA: Diagnosis not present

## 2015-06-05 DIAGNOSIS — F418 Other specified anxiety disorders: Secondary | ICD-10-CM | POA: Diagnosis not present

## 2015-06-05 DIAGNOSIS — F17200 Nicotine dependence, unspecified, uncomplicated: Secondary | ICD-10-CM | POA: Diagnosis not present

## 2015-06-05 DIAGNOSIS — I10 Essential (primary) hypertension: Secondary | ICD-10-CM | POA: Diagnosis not present

## 2015-06-05 DIAGNOSIS — R109 Unspecified abdominal pain: Secondary | ICD-10-CM | POA: Diagnosis not present

## 2015-06-05 DIAGNOSIS — E119 Type 2 diabetes mellitus without complications: Secondary | ICD-10-CM | POA: Diagnosis not present

## 2015-06-05 DIAGNOSIS — J449 Chronic obstructive pulmonary disease, unspecified: Secondary | ICD-10-CM | POA: Diagnosis not present

## 2015-06-29 DIAGNOSIS — I10 Essential (primary) hypertension: Secondary | ICD-10-CM | POA: Diagnosis not present

## 2015-06-29 DIAGNOSIS — E782 Mixed hyperlipidemia: Secondary | ICD-10-CM | POA: Diagnosis not present

## 2015-06-29 DIAGNOSIS — M545 Low back pain: Secondary | ICD-10-CM | POA: Diagnosis not present

## 2015-06-29 DIAGNOSIS — J449 Chronic obstructive pulmonary disease, unspecified: Secondary | ICD-10-CM | POA: Diagnosis not present

## 2015-06-29 DIAGNOSIS — Z6833 Body mass index (BMI) 33.0-33.9, adult: Secondary | ICD-10-CM | POA: Diagnosis not present

## 2015-06-29 DIAGNOSIS — E119 Type 2 diabetes mellitus without complications: Secondary | ICD-10-CM | POA: Diagnosis not present

## 2015-07-10 DIAGNOSIS — M5416 Radiculopathy, lumbar region: Secondary | ICD-10-CM | POA: Diagnosis not present

## 2015-07-10 DIAGNOSIS — M9903 Segmental and somatic dysfunction of lumbar region: Secondary | ICD-10-CM | POA: Diagnosis not present

## 2015-07-16 DIAGNOSIS — Z885 Allergy status to narcotic agent status: Secondary | ICD-10-CM | POA: Diagnosis not present

## 2015-07-16 DIAGNOSIS — S161XXA Strain of muscle, fascia and tendon at neck level, initial encounter: Secondary | ICD-10-CM | POA: Diagnosis not present

## 2015-07-16 DIAGNOSIS — Z6837 Body mass index (BMI) 37.0-37.9, adult: Secondary | ICD-10-CM | POA: Diagnosis not present

## 2015-07-16 DIAGNOSIS — W01190A Fall on same level from slipping, tripping and stumbling with subsequent striking against furniture, initial encounter: Secondary | ICD-10-CM | POA: Diagnosis not present

## 2015-07-16 DIAGNOSIS — M62838 Other muscle spasm: Secondary | ICD-10-CM | POA: Diagnosis not present

## 2015-07-16 DIAGNOSIS — I509 Heart failure, unspecified: Secondary | ICD-10-CM | POA: Diagnosis not present

## 2015-07-16 DIAGNOSIS — I10 Essential (primary) hypertension: Secondary | ICD-10-CM | POA: Diagnosis not present

## 2015-07-16 DIAGNOSIS — E119 Type 2 diabetes mellitus without complications: Secondary | ICD-10-CM | POA: Diagnosis not present

## 2015-07-16 DIAGNOSIS — Z79891 Long term (current) use of opiate analgesic: Secondary | ICD-10-CM | POA: Diagnosis not present

## 2015-07-16 DIAGNOSIS — Z882 Allergy status to sulfonamides status: Secondary | ICD-10-CM | POA: Diagnosis not present

## 2015-07-16 DIAGNOSIS — Z7982 Long term (current) use of aspirin: Secondary | ICD-10-CM | POA: Diagnosis not present

## 2015-07-16 DIAGNOSIS — E669 Obesity, unspecified: Secondary | ICD-10-CM | POA: Diagnosis not present

## 2015-07-16 DIAGNOSIS — R51 Headache: Secondary | ICD-10-CM | POA: Diagnosis not present

## 2015-07-16 DIAGNOSIS — F17210 Nicotine dependence, cigarettes, uncomplicated: Secondary | ICD-10-CM | POA: Diagnosis not present

## 2015-07-16 DIAGNOSIS — Z8673 Personal history of transient ischemic attack (TIA), and cerebral infarction without residual deficits: Secondary | ICD-10-CM | POA: Diagnosis not present

## 2015-07-22 DIAGNOSIS — R51 Headache: Secondary | ICD-10-CM | POA: Diagnosis not present

## 2015-07-22 DIAGNOSIS — G936 Cerebral edema: Secondary | ICD-10-CM | POA: Diagnosis not present

## 2015-07-22 DIAGNOSIS — I10 Essential (primary) hypertension: Secondary | ICD-10-CM | POA: Diagnosis not present

## 2015-07-22 DIAGNOSIS — Z6832 Body mass index (BMI) 32.0-32.9, adult: Secondary | ICD-10-CM | POA: Diagnosis not present

## 2015-07-22 DIAGNOSIS — F17210 Nicotine dependence, cigarettes, uncomplicated: Secondary | ICD-10-CM | POA: Diagnosis not present

## 2015-07-22 DIAGNOSIS — Z981 Arthrodesis status: Secondary | ICD-10-CM | POA: Diagnosis not present

## 2015-07-22 DIAGNOSIS — E119 Type 2 diabetes mellitus without complications: Secondary | ICD-10-CM | POA: Diagnosis not present

## 2015-07-22 DIAGNOSIS — J3800 Paralysis of vocal cords and larynx, unspecified: Secondary | ICD-10-CM | POA: Diagnosis not present

## 2015-07-22 DIAGNOSIS — Z8673 Personal history of transient ischemic attack (TIA), and cerebral infarction without residual deficits: Secondary | ICD-10-CM | POA: Diagnosis not present

## 2015-07-22 DIAGNOSIS — I509 Heart failure, unspecified: Secondary | ICD-10-CM | POA: Diagnosis not present

## 2015-07-22 DIAGNOSIS — E669 Obesity, unspecified: Secondary | ICD-10-CM | POA: Diagnosis not present

## 2015-07-22 DIAGNOSIS — R42 Dizziness and giddiness: Secondary | ICD-10-CM | POA: Diagnosis not present

## 2015-07-22 DIAGNOSIS — E876 Hypokalemia: Secondary | ICD-10-CM | POA: Diagnosis not present

## 2015-07-24 DIAGNOSIS — G939 Disorder of brain, unspecified: Secondary | ICD-10-CM | POA: Diagnosis not present

## 2015-07-24 DIAGNOSIS — R51 Headache: Secondary | ICD-10-CM | POA: Diagnosis not present

## 2015-07-24 DIAGNOSIS — G936 Cerebral edema: Secondary | ICD-10-CM | POA: Diagnosis not present

## 2015-07-24 DIAGNOSIS — Z6833 Body mass index (BMI) 33.0-33.9, adult: Secondary | ICD-10-CM | POA: Diagnosis not present

## 2015-07-25 DIAGNOSIS — R634 Abnormal weight loss: Secondary | ICD-10-CM | POA: Diagnosis not present

## 2015-07-27 DIAGNOSIS — G939 Disorder of brain, unspecified: Secondary | ICD-10-CM | POA: Diagnosis not present

## 2015-07-27 DIAGNOSIS — I639 Cerebral infarction, unspecified: Secondary | ICD-10-CM | POA: Diagnosis not present

## 2015-07-27 DIAGNOSIS — R51 Headache: Secondary | ICD-10-CM | POA: Diagnosis not present

## 2015-07-31 DIAGNOSIS — J8410 Pulmonary fibrosis, unspecified: Secondary | ICD-10-CM | POA: Diagnosis not present

## 2015-07-31 DIAGNOSIS — R1013 Epigastric pain: Secondary | ICD-10-CM | POA: Diagnosis not present

## 2015-07-31 DIAGNOSIS — C719 Malignant neoplasm of brain, unspecified: Secondary | ICD-10-CM | POA: Diagnosis not present

## 2015-07-31 DIAGNOSIS — R930 Abnormal findings on diagnostic imaging of skull and head, not elsewhere classified: Secondary | ICD-10-CM | POA: Diagnosis not present

## 2015-07-31 DIAGNOSIS — R634 Abnormal weight loss: Secondary | ICD-10-CM | POA: Diagnosis not present

## 2015-08-01 DIAGNOSIS — K219 Gastro-esophageal reflux disease without esophagitis: Secondary | ICD-10-CM | POA: Diagnosis not present

## 2015-08-01 DIAGNOSIS — Z8673 Personal history of transient ischemic attack (TIA), and cerebral infarction without residual deficits: Secondary | ICD-10-CM | POA: Diagnosis not present

## 2015-08-01 DIAGNOSIS — E119 Type 2 diabetes mellitus without complications: Secondary | ICD-10-CM | POA: Diagnosis not present

## 2015-08-01 DIAGNOSIS — I10 Essential (primary) hypertension: Secondary | ICD-10-CM | POA: Diagnosis not present

## 2015-08-01 DIAGNOSIS — Z8249 Family history of ischemic heart disease and other diseases of the circulatory system: Secondary | ICD-10-CM

## 2015-08-01 DIAGNOSIS — Z808 Family history of malignant neoplasm of other organs or systems: Secondary | ICD-10-CM | POA: Diagnosis not present

## 2015-08-01 DIAGNOSIS — G936 Cerebral edema: Secondary | ICD-10-CM | POA: Diagnosis not present

## 2015-08-01 DIAGNOSIS — J449 Chronic obstructive pulmonary disease, unspecified: Secondary | ICD-10-CM | POA: Diagnosis not present

## 2015-08-01 DIAGNOSIS — Z87891 Personal history of nicotine dependence: Secondary | ICD-10-CM | POA: Diagnosis not present

## 2015-08-01 DIAGNOSIS — G939 Disorder of brain, unspecified: Secondary | ICD-10-CM | POA: Diagnosis not present

## 2015-08-01 DIAGNOSIS — Z79899 Other long term (current) drug therapy: Secondary | ICD-10-CM | POA: Diagnosis not present

## 2015-08-02 DIAGNOSIS — D496 Neoplasm of unspecified behavior of brain: Secondary | ICD-10-CM | POA: Diagnosis not present

## 2015-08-06 ENCOUNTER — Other Ambulatory Visit (HOSPITAL_COMMUNITY): Payer: Self-pay | Admitting: Neurosurgery

## 2015-08-06 ENCOUNTER — Other Ambulatory Visit: Payer: Self-pay | Admitting: Neurosurgery

## 2015-08-06 DIAGNOSIS — D496 Neoplasm of unspecified behavior of brain: Secondary | ICD-10-CM

## 2015-08-10 ENCOUNTER — Ambulatory Visit (HOSPITAL_COMMUNITY)
Admission: RE | Admit: 2015-08-10 | Discharge: 2015-08-10 | Disposition: A | Discharge status: Home / Self Care | Payer: Medicare Other | Source: Ambulatory Visit | Attending: Neurosurgery | Admitting: Neurosurgery

## 2015-08-10 DIAGNOSIS — R262 Difficulty in walking, not elsewhere classified: Secondary | ICD-10-CM | POA: Insufficient documentation

## 2015-08-10 DIAGNOSIS — R410 Disorientation, unspecified: Secondary | ICD-10-CM | POA: Insufficient documentation

## 2015-08-10 DIAGNOSIS — Z8673 Personal history of transient ischemic attack (TIA), and cerebral infarction without residual deficits: Secondary | ICD-10-CM | POA: Insufficient documentation

## 2015-08-10 DIAGNOSIS — I10 Essential (primary) hypertension: Secondary | ICD-10-CM | POA: Insufficient documentation

## 2015-08-10 DIAGNOSIS — R110 Nausea: Secondary | ICD-10-CM | POA: Insufficient documentation

## 2015-08-10 DIAGNOSIS — R200 Anesthesia of skin: Secondary | ICD-10-CM | POA: Insufficient documentation

## 2015-08-10 DIAGNOSIS — R531 Weakness: Secondary | ICD-10-CM | POA: Diagnosis not present

## 2015-08-10 DIAGNOSIS — G319 Degenerative disease of nervous system, unspecified: Secondary | ICD-10-CM | POA: Diagnosis not present

## 2015-08-10 DIAGNOSIS — E119 Type 2 diabetes mellitus without complications: Secondary | ICD-10-CM | POA: Diagnosis not present

## 2015-08-10 DIAGNOSIS — D496 Neoplasm of unspecified behavior of brain: Secondary | ICD-10-CM | POA: Diagnosis not present

## 2015-08-10 DIAGNOSIS — G9389 Other specified disorders of brain: Secondary | ICD-10-CM | POA: Diagnosis not present

## 2015-08-10 LAB — CREATININE, SERUM
Creatinine, Ser: 0.87 mg/dL (ref 0.44–1.00)
GFR calc Af Amer: >60 mL/min (ref >60)
GFR calc non Af Amer: >60 mL/min (ref >60)

## 2015-08-10 MED ORDER — GADOBENATE DIMEGLUMINE 529 MG/ML IV SOLN
20.0000 mL | Freq: Once | INTRAVENOUS | Status: AC | PRN
  Administered 2015-08-10: 20 mL via INTRAVENOUS

## 2015-08-14 ENCOUNTER — Encounter (HOSPITAL_COMMUNITY): Payer: Self-pay

## 2015-08-14 ENCOUNTER — Encounter (HOSPITAL_COMMUNITY)
Admission: RE | Admit: 2015-08-14 | Discharge: 2015-08-14 | Disposition: A | Discharge status: Home / Self Care | Payer: Medicare Other | Source: Ambulatory Visit | Attending: Neurosurgery | Admitting: Neurosurgery

## 2015-08-14 DIAGNOSIS — Z8673 Personal history of transient ischemic attack (TIA), and cerebral infarction without residual deficits: Secondary | ICD-10-CM | POA: Diagnosis not present

## 2015-08-14 DIAGNOSIS — Z7982 Long term (current) use of aspirin: Secondary | ICD-10-CM | POA: Diagnosis not present

## 2015-08-14 DIAGNOSIS — Z538 Procedure and treatment not carried out for other reasons: Secondary | ICD-10-CM | POA: Diagnosis not present

## 2015-08-14 DIAGNOSIS — K219 Gastro-esophageal reflux disease without esophagitis: Secondary | ICD-10-CM | POA: Diagnosis not present

## 2015-08-14 DIAGNOSIS — Z7984 Long term (current) use of oral hypoglycemic drugs: Secondary | ICD-10-CM | POA: Diagnosis not present

## 2015-08-14 DIAGNOSIS — E785 Hyperlipidemia, unspecified: Secondary | ICD-10-CM | POA: Diagnosis not present

## 2015-08-14 DIAGNOSIS — G939 Disorder of brain, unspecified: Secondary | ICD-10-CM | POA: Diagnosis not present

## 2015-08-14 DIAGNOSIS — I10 Essential (primary) hypertension: Secondary | ICD-10-CM | POA: Diagnosis not present

## 2015-08-14 DIAGNOSIS — G4733 Obstructive sleep apnea (adult) (pediatric): Secondary | ICD-10-CM | POA: Diagnosis not present

## 2015-08-14 DIAGNOSIS — G936 Cerebral edema: Secondary | ICD-10-CM | POA: Diagnosis not present

## 2015-08-14 DIAGNOSIS — Z79899 Other long term (current) drug therapy: Secondary | ICD-10-CM | POA: Diagnosis not present

## 2015-08-14 DIAGNOSIS — F17200 Nicotine dependence, unspecified, uncomplicated: Secondary | ICD-10-CM | POA: Diagnosis not present

## 2015-08-14 DIAGNOSIS — E119 Type 2 diabetes mellitus without complications: Secondary | ICD-10-CM | POA: Diagnosis not present

## 2015-08-14 HISTORY — DX: Unspecified osteoarthritis, unspecified site: M19.90

## 2015-08-14 HISTORY — DX: Reserved for inherently not codable concepts without codable children: IMO0001

## 2015-08-14 HISTORY — DX: Anxiety disorder, unspecified: F41.9

## 2015-08-14 HISTORY — DX: Sleep apnea, unspecified: G47.30

## 2015-08-14 HISTORY — DX: Gastro-esophageal reflux disease without esophagitis: K21.9

## 2015-08-14 LAB — ABO/RH: ABO/RH(D): A POS

## 2015-08-14 LAB — BASIC METABOLIC PANEL
Anion gap: 14 (ref 5–15)
BUN: 33 mg/dL — AB (ref 6–20)
CALCIUM: 10.2 mg/dL (ref 8.9–10.3)
CHLORIDE: 96 mmol/L — AB (ref 101–111)
CO2: 19 mmol/L — AB (ref 22–32)
CREATININE: 0.93 mg/dL (ref 0.44–1.00)
GFR calc non Af Amer: >60 mL/min (ref >60)
Glucose, Bld: 382 mg/dL — ABNORMAL HIGH (ref 65–99)
Potassium: 4.2 mmol/L (ref 3.5–5.1)
SODIUM: 129 mmol/L — AB (ref 135–145)

## 2015-08-14 LAB — TYPE AND SCREEN
ABO/RH(D): A POS
Antibody Screen: NEGATIVE

## 2015-08-14 LAB — CBC
HCT: 41.1 % (ref 36.0–46.0)
Hemoglobin: 14.6 g/dL (ref 12.0–15.0)
MCH: 30.6 pg (ref 26.0–34.0)
MCHC: 35.5 g/dL (ref 30.0–36.0)
MCV: 86.2 fL (ref 78.0–100.0)
PLATELETS: 341 10*3/uL (ref 150–400)
RBC: 4.77 MIL/uL (ref 3.87–5.11)
RDW: 13.5 % (ref 11.5–15.5)
WBC: 16.2 10*3/uL — ABNORMAL HIGH (ref 4.0–10.5)

## 2015-08-14 LAB — SURGICAL PCR SCREEN
MRSA, PCR: NEGATIVE
Staphylococcus aureus: NEGATIVE

## 2015-08-14 LAB — GLUCOSE, CAPILLARY: Glucose-Capillary: 364 mg/dL — ABNORMAL HIGH (ref 65–99)

## 2015-08-14 NOTE — Pre-Procedure Instructions (Addendum)
SASKIA SIMERSON  08/14/2015      [id number] [place] [place] - [id number]: [phone]: [phone] [facility] [id number]: [phone]: [phone]   Your procedure is scheduled on 08/16/15  [facility]  Call this number if you have problems the morning of surgery:  [phone]   Remember:  Do not eat food or drink liquids after midnight.  Take these medicines the morning of surgery with A SIP OF WATER inhalers(bring albuterol with you),xanax,pain med if needed, celexa,decadron,dexilant,metoprolol,zantac  STOP all herbel meds, nsaids (aleve,naproxen,advil,ibuprofen) NOW including aspirin,vitamins  NO glipizide, metformin,actos,or januvia am of surgery  How to Manage Your Diabetes Before Surgery   Why is it important to control my blood sugar before and after surgery?   Improving blood sugar levels before and after surgery helps healing and can limit problems.  A way of improving blood sugar control is eating a healthy diet by:  - Eating less sugar and carbohydrates  - Increasing activity/exercise  - Talk with your doctor about reaching your blood sugar goals  High blood sugars (greater than 180 mg/dL) can raise your risk of infections and slow down your recovery so you will need to focus on controlling your diabetes during the weeks before surgery.  Make sure that the doctor who takes care of your diabetes knows about your planned surgery including the date and location.  How do I manage my blood sugars before surgery?   Check your blood sugar at least 4 times a day, 2 days before surgery to make sure that they are not too high or low.   Check your blood sugar the morning of your  surgery when you wake up and every 2               hours until you get to the Short-Stay unit.  If your blood sugar is less than 70 mg/dL, you will need to treat for low blood sugar by:  Treat a low blood sugar (less than 70 mg/dL) with 1/2 cup of clear juice (cranberry or apple), 4 glucose tablets, OR glucose gel.  Recheck blood sugar in 15 minutes after treatment (to make sure it is greater than 70 mg/dL).  If blood sugar is not greater than 70 mg/dL on re-check, call [phone] further instructions.   Report your blood sugar to the Short-Stay nurse when you get to Short-Stay.  References:  [facility] 2007 "How to Manage your Diabetes Before and After Surgery".  What do I do about my diabetes medications?   Do not take oral diabetes medicines (pills) the morning of surgery.(glipizide,metformin,actos, januvia        Do not wear jewelry, make-up or nail polish.  Do not wear lotions, powders, or perfumes.  You may wear deodorant.  Do not shave 48 hours prior to surgery.  Men may shave face and neck.  Do not bring valuables to the hospital.  [facility] or [facility]ables.  Contacts, dentures or bridgework may not be worn into surgery.  Leave your suitcase in the car.  After surgery it may be brought to your room.  For patients admitted to the hospital, discharge  time will be determined by your treatment team.  Patients discharged the day of surgery will not be allowed to drive home.   Name and phone number of your driver:   Special instructions:   Special Instructions: [facility] - Preparing for Surgery  Before surgery, you can play an important role.  Because skin is not sterile, your skin needs to be as free of germs as possible.  You can reduce the number of germs on you skin by washing with CHG (chlorahexidine gluconate) soap before surgery.  CHG is an antiseptic cleaner which kills germs and bonds with the  skin to continue killing germs even after washing.  Please DO NOT use if you have an allergy to CHG or antibacterial soaps.  If your skin becomes reddened/irritated stop using the CHG and inform your nurse when you arrive at Short Stay.  Do not shave (including legs and underarms) for at least 48 hours prior to the first CHG shower.  You may shave your face.  Please follow these instructions carefully:   1.  Shower with CHG Soap the night before surgery and the morning of Surgery.  2.  If you choose to wash your hair, wash your hair first as usual with your normal shampoo.  3.  After you shampoo, rinse your hair and body thoroughly to remove the Shampoo.  4.  Use CHG as you would any other liquid soap.  You can apply chg directly  to the skin and wash gently with scrungie or a clean washcloth.  5.  Apply the CHG Soap to your body ONLY FROM THE NECK DOWN.  Do not use on open wounds or open sores.  Avoid contact with your eyes ears, mouth and genitals (private parts).  Wash genitals (private parts)       with your normal soap.  6.  Wash thoroughly, paying special attention to the area where your surgery will be performed.  7.  Thoroughly rinse your body with warm water from the neck down.  8.  DO NOT shower/wash with your normal soap after using and rinsing off the CHG Soap.  9.  Pat yourself dry with a clean towel.            10.  Wear clean pajamas.            11.  Place clean sheets on your bed the night of your first shower and do not sleep with pets.  Day of Surgery  Do not apply any lotions/deodorants the morning of surgery.  Please wear clean clothes to the hospital/surgery center.  Please read over the following fact sheets that you were given. Pain Booklet, Coughing and Deep Breathing, Blood Transfusion Information, MRSA Information and Surgical Site Infection Prevention

## 2015-08-14 NOTE — Progress Notes (Signed)
req'd office note ,most recent a1c from pcp-nathan conroy pa [facility] [phone] fax

## 2015-08-15 ENCOUNTER — Encounter (HOSPITAL_COMMUNITY): Payer: Self-pay | Admitting: Certified Registered Nurse Anesthetist

## 2015-08-15 ENCOUNTER — Encounter (HOSPITAL_COMMUNITY): Payer: Self-pay | Admitting: Emergency Medicine

## 2015-08-15 LAB — HEMOGLOBIN A1C
HEMOGLOBIN A1C: 8.1 % — AB (ref 4.8–5.6)
MEAN PLASMA GLUCOSE: 186 mg/dL

## 2015-08-15 MED ORDER — CEFAZOLIN SODIUM-DEXTROSE 2-3 GM-% IV SOLR
2.0000 g | INTRAVENOUS | Status: DC
  Filled 2015-08-15: qty 50

## 2015-08-15 NOTE — Progress Notes (Signed)
Anesthesia Chart Review:  Pt is 70 year old female scheduled for L parietal craniotomy with [facility] on 08/16/2015 with Dr. Arnoldo Morale.   PCP is Cyndi Bender, PA  PMH includes: HTN, DM, hyperlipidemia, stroke x2, OSA (on CPAP), GERD. Current smoker. BMI 32  Medications include: albuterol, ASA, dexamethasone, fenofibrate, glipizide, losartan, metformin, metoprolol, olmesartan-hctz, pioglitazone, potassium, zantac, sitagliptin, albuterol.   Preoperative labs reviewed.  Glucose 382, hgbA1c 8.1. Prior A1c from PCP's office was 7.7 on 06/05/15. Elevated glucose is likely due to dexamethasone. Na 129, Cl 96. Will recheck DOS.   EKG 08/14/15: NSR  Echo 06/08/13:  - Normal systolic function. Impaired relaxation.  - Trace AR, MR, and TR.   Carotid duplex 08/03/11: No significant extracranial carotid artery stenosis.   If labs acceptable DOS, I anticipate pt can proceed as scheduled.   Willeen Cass, FNP-BC [facility]/Anesthesiology Phone: [phone] 08/15/2015 9:19 AM

## 2015-08-16 ENCOUNTER — Ambulatory Visit (HOSPITAL_COMMUNITY)
Admission: RE | Admit: 2015-08-16 | Discharge: 2015-08-16 | DRG: 081 | Disposition: A | Discharge status: Home / Self Care | Payer: Medicare Other | Source: Ambulatory Visit | Attending: Neurosurgery | Admitting: Neurosurgery

## 2015-08-16 ENCOUNTER — Encounter (HOSPITAL_COMMUNITY)
Admission: RE | Disposition: A | Discharge status: Home / Self Care | Payer: Self-pay | Source: Ambulatory Visit | Attending: Neurosurgery

## 2015-08-16 ENCOUNTER — Encounter (HOSPITAL_COMMUNITY): Payer: Self-pay | Admitting: *Deleted

## 2015-08-16 DIAGNOSIS — Z79899 Other long term (current) drug therapy: Secondary | ICD-10-CM | POA: Insufficient documentation

## 2015-08-16 DIAGNOSIS — Z538 Procedure and treatment not carried out for other reasons: Secondary | ICD-10-CM | POA: Diagnosis not present

## 2015-08-16 DIAGNOSIS — K219 Gastro-esophageal reflux disease without esophagitis: Secondary | ICD-10-CM | POA: Insufficient documentation

## 2015-08-16 DIAGNOSIS — G936 Cerebral edema: Secondary | ICD-10-CM | POA: Insufficient documentation

## 2015-08-16 DIAGNOSIS — E119 Type 2 diabetes mellitus without complications: Secondary | ICD-10-CM | POA: Insufficient documentation

## 2015-08-16 DIAGNOSIS — Z7984 Long term (current) use of oral hypoglycemic drugs: Secondary | ICD-10-CM | POA: Insufficient documentation

## 2015-08-16 DIAGNOSIS — F17200 Nicotine dependence, unspecified, uncomplicated: Secondary | ICD-10-CM | POA: Insufficient documentation

## 2015-08-16 DIAGNOSIS — E785 Hyperlipidemia, unspecified: Secondary | ICD-10-CM | POA: Diagnosis not present

## 2015-08-16 DIAGNOSIS — G939 Disorder of brain, unspecified: Secondary | ICD-10-CM | POA: Insufficient documentation

## 2015-08-16 DIAGNOSIS — I10 Essential (primary) hypertension: Secondary | ICD-10-CM | POA: Insufficient documentation

## 2015-08-16 DIAGNOSIS — Z8673 Personal history of transient ischemic attack (TIA), and cerebral infarction without residual deficits: Secondary | ICD-10-CM | POA: Insufficient documentation

## 2015-08-16 DIAGNOSIS — Z7982 Long term (current) use of aspirin: Secondary | ICD-10-CM | POA: Insufficient documentation

## 2015-08-16 DIAGNOSIS — G4733 Obstructive sleep apnea (adult) (pediatric): Secondary | ICD-10-CM | POA: Insufficient documentation

## 2015-08-16 LAB — BASIC METABOLIC PANEL
Anion gap: 11 (ref 5–15)
BUN: 31 mg/dL — AB (ref 6–20)
CHLORIDE: 97 mmol/L — AB (ref 101–111)
CO2: 24 mmol/L (ref 22–32)
Calcium: 9.7 mg/dL (ref 8.9–10.3)
Creatinine, Ser: 0.78 mg/dL (ref 0.44–1.00)
GFR calc Af Amer: >60 mL/min (ref >60)
GFR calc non Af Amer: >60 mL/min (ref >60)
GLUCOSE: 178 mg/dL — AB (ref 65–99)
POTASSIUM: 4.1 mmol/L (ref 3.5–5.1)
SODIUM: 132 mmol/L — AB (ref 135–145)

## 2015-08-16 LAB — GLUCOSE, CAPILLARY
GLUCOSE-CAPILLARY: 187 mg/dL — AB (ref 65–99)
Glucose-Capillary: 172 mg/dL — ABNORMAL HIGH (ref 65–99)

## 2015-08-16 SURGERY — CRANIOTOMY TUMOR EXCISION
Anesthesia: General | Laterality: Left

## 2015-08-16 MED ORDER — ONDANSETRON HCL 4 MG/2ML IJ SOLN
4.0000 mg | Freq: Once | INTRAMUSCULAR | Status: AC
  Administered 2015-08-16: 4 mg via INTRAVENOUS
  Filled 2015-08-16: qty 2

## 2015-08-16 MED ORDER — PROPOFOL 10 MG/ML IV BOLUS
INTRAVENOUS | Status: AC
  Filled 2015-08-16: qty 20

## 2015-08-16 MED ORDER — FENTANYL CITRATE (PF) 250 MCG/5ML IJ SOLN
INTRAMUSCULAR | Status: AC
  Filled 2015-08-16: qty 5

## 2015-08-16 MED ORDER — ROCURONIUM BROMIDE 50 MG/5ML IV SOLN
INTRAVENOUS | Status: AC
  Filled 2015-08-16: qty 1

## 2015-08-16 MED ORDER — ONDANSETRON HCL 4 MG/2ML IJ SOLN
INTRAMUSCULAR | Status: AC
  Administered 2015-08-16: 4 mg via INTRAVENOUS
  Filled 2015-08-16: qty 2

## 2015-08-16 MED ORDER — LIDOCAINE HCL (CARDIAC) 20 MG/ML IV SOLN
INTRAVENOUS | Status: AC
  Filled 2015-08-16: qty 10

## 2015-08-16 MED ORDER — LACTATED RINGERS IV SOLN
INTRAVENOUS | Status: DC
  Administered 2015-08-16: 10:00:00 via INTRAVENOUS

## 2015-08-16 MED ORDER — MIDAZOLAM HCL 2 MG/2ML IJ SOLN
INTRAMUSCULAR | Status: AC
  Filled 2015-08-16: qty 2

## 2015-08-16 NOTE — Progress Notes (Signed)
Patient c/o nausea, notified Dr. Stevphen Rochester, new orders received. Will continue to monitor.

## 2015-08-16 NOTE — H&P (Cosign Needed)
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 I discussed this with the patient and her family. I have recommended that we cancel her surgery and plan to repeat her MRI scan in about 6 weeks. I have answered all the questions. They agree with this plan.

## 2015-08-22 DIAGNOSIS — R12 Heartburn: Secondary | ICD-10-CM | POA: Diagnosis not present

## 2015-08-22 DIAGNOSIS — J019 Acute sinusitis, unspecified: Secondary | ICD-10-CM | POA: Diagnosis not present

## 2015-08-30 DIAGNOSIS — I10 Essential (primary) hypertension: Secondary | ICD-10-CM | POA: Diagnosis not present

## 2015-08-30 DIAGNOSIS — E119 Type 2 diabetes mellitus without complications: Secondary | ICD-10-CM | POA: Diagnosis not present

## 2015-08-30 DIAGNOSIS — D332 Benign neoplasm of brain, unspecified: Secondary | ICD-10-CM | POA: Diagnosis not present

## 2015-08-30 DIAGNOSIS — B370 Candidal stomatitis: Secondary | ICD-10-CM | POA: Diagnosis not present

## 2015-08-31 DIAGNOSIS — G939 Disorder of brain, unspecified: Secondary | ICD-10-CM | POA: Diagnosis not present

## 2015-08-31 DIAGNOSIS — G936 Cerebral edema: Secondary | ICD-10-CM | POA: Diagnosis not present

## 2015-08-31 DIAGNOSIS — Z8673 Personal history of transient ischemic attack (TIA), and cerebral infarction without residual deficits: Secondary | ICD-10-CM | POA: Diagnosis not present

## 2015-09-04 ENCOUNTER — Other Ambulatory Visit (HOSPITAL_COMMUNITY): Payer: Self-pay | Admitting: Neurosurgery

## 2015-09-04 DIAGNOSIS — D496 Neoplasm of unspecified behavior of brain: Secondary | ICD-10-CM

## 2015-09-10 DIAGNOSIS — R042 Hemoptysis: Secondary | ICD-10-CM | POA: Diagnosis not present

## 2015-09-10 DIAGNOSIS — R05 Cough: Secondary | ICD-10-CM | POA: Diagnosis not present

## 2015-09-10 DIAGNOSIS — Z6837 Body mass index (BMI) 37.0-37.9, adult: Secondary | ICD-10-CM | POA: Diagnosis not present

## 2015-09-17 ENCOUNTER — Emergency Department (HOSPITAL_COMMUNITY): Payer: Medicare Other

## 2015-09-17 ENCOUNTER — Emergency Department (HOSPITAL_COMMUNITY)
Admission: EM | Admit: 2015-09-17 | Discharge: 2015-09-18 | Disposition: A | Discharge status: Home / Self Care | Payer: Medicare Other | Attending: Emergency Medicine | Admitting: Emergency Medicine

## 2015-09-17 ENCOUNTER — Encounter (HOSPITAL_COMMUNITY): Payer: Self-pay | Admitting: *Deleted

## 2015-09-17 DIAGNOSIS — J449 Chronic obstructive pulmonary disease, unspecified: Secondary | ICD-10-CM | POA: Insufficient documentation

## 2015-09-17 DIAGNOSIS — Z8673 Personal history of transient ischemic attack (TIA), and cerebral infarction without residual deficits: Secondary | ICD-10-CM | POA: Insufficient documentation

## 2015-09-17 DIAGNOSIS — H538 Other visual disturbances: Secondary | ICD-10-CM | POA: Diagnosis not present

## 2015-09-17 DIAGNOSIS — R110 Nausea: Secondary | ICD-10-CM | POA: Diagnosis not present

## 2015-09-17 DIAGNOSIS — D496 Neoplasm of unspecified behavior of brain: Secondary | ICD-10-CM | POA: Diagnosis not present

## 2015-09-17 DIAGNOSIS — R42 Dizziness and giddiness: Secondary | ICD-10-CM | POA: Insufficient documentation

## 2015-09-17 DIAGNOSIS — E7800 Pure hypercholesterolemia, unspecified: Secondary | ICD-10-CM | POA: Insufficient documentation

## 2015-09-17 DIAGNOSIS — I10 Essential (primary) hypertension: Secondary | ICD-10-CM | POA: Diagnosis not present

## 2015-09-17 DIAGNOSIS — F17210 Nicotine dependence, cigarettes, uncomplicated: Secondary | ICD-10-CM | POA: Diagnosis not present

## 2015-09-17 DIAGNOSIS — F419 Anxiety disorder, unspecified: Secondary | ICD-10-CM | POA: Insufficient documentation

## 2015-09-17 DIAGNOSIS — R519 Headache, unspecified: Secondary | ICD-10-CM

## 2015-09-17 DIAGNOSIS — K219 Gastro-esophageal reflux disease without esophagitis: Secondary | ICD-10-CM | POA: Insufficient documentation

## 2015-09-17 DIAGNOSIS — R51 Headache: Secondary | ICD-10-CM | POA: Insufficient documentation

## 2015-09-17 DIAGNOSIS — E119 Type 2 diabetes mellitus without complications: Secondary | ICD-10-CM | POA: Insufficient documentation

## 2015-09-17 DIAGNOSIS — Z6834 Body mass index (BMI) 34.0-34.9, adult: Secondary | ICD-10-CM | POA: Diagnosis not present

## 2015-09-17 DIAGNOSIS — G4730 Sleep apnea, unspecified: Secondary | ICD-10-CM | POA: Diagnosis not present

## 2015-09-17 DIAGNOSIS — M1990 Unspecified osteoarthritis, unspecified site: Secondary | ICD-10-CM | POA: Insufficient documentation

## 2015-09-17 DIAGNOSIS — H532 Diplopia: Secondary | ICD-10-CM | POA: Diagnosis not present

## 2015-09-17 DIAGNOSIS — E669 Obesity, unspecified: Secondary | ICD-10-CM | POA: Diagnosis not present

## 2015-09-17 DIAGNOSIS — I639 Cerebral infarction, unspecified: Secondary | ICD-10-CM | POA: Diagnosis not present

## 2015-09-17 LAB — BASIC METABOLIC PANEL
Anion gap: 14 (ref 5–15)
BUN: 15 mg/dL (ref 6–20)
CALCIUM: 9.8 mg/dL (ref 8.9–10.3)
CO2: 25 mmol/L (ref 22–32)
CREATININE: 0.76 mg/dL (ref 0.44–1.00)
Chloride: 97 mmol/L — ABNORMAL LOW (ref 101–111)
GFR calc Af Amer: >60 mL/min (ref >60)
GLUCOSE: 116 mg/dL — AB (ref 65–99)
POTASSIUM: 3.5 mmol/L (ref 3.5–5.1)
SODIUM: 136 mmol/L (ref 135–145)

## 2015-09-17 LAB — CBC
HEMATOCRIT: 34.8 % — AB (ref 36.0–46.0)
Hemoglobin: 12 g/dL (ref 12.0–15.0)
MCH: 30.8 pg (ref 26.0–34.0)
MCHC: 34.5 g/dL (ref 30.0–36.0)
MCV: 89.5 fL (ref 78.0–100.0)
PLATELETS: 243 10*3/uL (ref 150–400)
RBC: 3.89 MIL/uL (ref 3.87–5.11)
RDW: 14.2 % (ref 11.5–15.5)
WBC: 7.1 10*3/uL (ref 4.0–10.5)

## 2015-09-17 MED ORDER — TRAMADOL HCL 50 MG PO TABS
50.0000 mg | ORAL_TABLET | Freq: Once | ORAL | Status: AC
  Administered 2015-09-17: 50 mg via ORAL
  Filled 2015-09-17: qty 1

## 2015-09-17 MED ORDER — GADOBENATE DIMEGLUMINE 529 MG/ML IV SOLN
20.0000 mL | Freq: Once | INTRAVENOUS | Status: DC | PRN
Start: 2015-09-17 — End: 2015-09-18

## 2015-09-17 MED ORDER — METHOCARBAMOL 1000 MG/10ML IJ SOLN
500.0000 mg | Freq: Once | INTRAVENOUS | Status: AC
  Administered 2015-09-17: 500 mg via INTRAVENOUS
  Filled 2015-09-17: qty 5

## 2015-09-17 MED ORDER — LORAZEPAM 2 MG/ML IJ SOLN
1.0000 mg | Freq: Once | INTRAMUSCULAR | Status: AC
  Administered 2015-09-17: 1 mg via INTRAVENOUS
  Filled 2015-09-17: qty 1

## 2015-09-17 MED ORDER — METHOCARBAMOL 1000 MG/10ML IJ SOLN
500.0000 mg | Freq: Once | INTRAMUSCULAR | Status: DC
  Filled 2015-09-17: qty 5

## 2015-09-17 NOTE — ED Notes (Signed)
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  However, Dr Arnoldo Morale felt the tumors were shrinking and they scheduled a new MRI for March.  Two weeks ago pt began feeling dizzy and having L temporal and occipital pain.  3 days ago she began having blurred vision in both eyes.  She called her MD and he stated to come here.

## 2015-09-17 NOTE — ED Provider Notes (Signed)
CSN: [id number]     Arrival date & time 09/17/15  1712 History   First MD Initiated Contact with Patient 09/17/15 2104     Chief Complaint  Patient presents with  . Blurred Vision  . Dizziness     (Consider location/radiation/quality/duration/timing/severity/associated sxs/prior Treatment) Patient is a 70 y.o. female presenting with dizziness and eye problem.  Dizziness Quality:  Lightheadedness Severity:  Moderate Onset quality:  Gradual Duration:  1 week Timing:  Constant Progression:  Worsening Chronicity:  New Relieved by:  None tried Worsened by:  Nothing Ineffective treatments:  None tried Associated symptoms: no blood in stool, no diarrhea, no palpitations and no shortness of breath   Associated symptoms comment:  Patient has a history of brain lesions who is currently being followed by Dr. Arnoldo Morale of neurosurgery. She was scheduled for surgery however after a repeat MRI revealing that the lesions were improving that was canceled. She is currently scheduled for follow-up in March.  Eye Problem Location:  Both Quality: blurry vision. Severity:  Moderate Onset quality:  Gradual Duration:  1 week Timing:  Constant Progression:  Worsening Chronicity:  New  Patient also presents with left-sided neck pain and headache similar to previous presentations.  Past Medical History  Diagnosis Date  . Diabetes mellitus   . Hypertension   . High cholesterol   . Stroke ([facility])     x2 12, 16  . Sleep apnea     cpap  . COPD (chronic obstructive pulmonary disease) ([facility])     sarcoidosis  . Shortness of breath dyspnea   . Anxiety   . GERD (gastroesophageal reflux disease)   . Arthritis    Past Surgical History  Procedure Laterality Date  . Neck surgery    . Back surgery    . Cholecystectomy    . Abdominal hysterectomy    . Ear examination under anesthesia Left     plate   Family History  Problem Relation Age of Onset  . Liver cancer Father    Social History   Substance Use Topics  . Smoking status: Light Tobacco Smoker -- 0.25 packs/day for 22 years    Types: Cigarettes  . Smokeless tobacco: Never Used     Comment: wants patches to quit  . Alcohol Use: No   OB History    No data available     Review of Systems  Constitutional: Negative for appetite change.  HENT: Negative for ear pain, facial swelling and mouth sores.   Eyes: Negative for visual disturbance.  Respiratory: Negative for chest tightness and shortness of breath.   Cardiovascular: Negative for palpitations.  Gastrointestinal: Negative for diarrhea and blood in stool.  Endocrine: Negative for cold intolerance and heat intolerance.  Genitourinary: Negative for frequency, decreased urine volume and difficulty urinating.  Musculoskeletal: Negative for back pain and neck stiffness.  Neurological: Positive for dizziness. Negative for light-headedness.  All other systems reviewed and are negative.     Allergies  Mometasone; Codeine; and Sulfa antibiotics  Home Medications   Prior to Admission medications   Medication Sig Start Date End Date Taking? Authorizing Provider  albuterol (PROVENTIL) (2.5 MG/3ML) 0.083% nebulizer solution Take 2.5 mg by nebulization 3 (three) times daily as needed for wheezing or shortness of breath.   Yes Historical Provider, MD  ALPRAZolam Duanne Moron) 0.5 MG tablet Take 0.5-1 mg by mouth daily as needed. For anxiety.   Yes Historical Provider, MD  aspirin EC 81 MG tablet Take 81 mg by mouth daily.  Yes Historical Provider, MD  atorvastatin (LIPITOR) 20 MG tablet Take 20 mg by mouth daily. 06/02/15  Yes Historical Provider, MD  azelastine (ASTELIN) 137 MCG/SPRAY nasal spray Place 2 sprays into both nostrils daily as needed for allergies. Use in each nostril as directed   Yes Historical Provider, MD  bisacodyl (DULCOLAX) 10 MG suppository Place 10 mg rectally daily as needed for mild constipation or moderate constipation.   Yes Historical Provider, MD   citalopram (CELEXA) 20 MG tablet Take 20 mg by mouth daily.   Yes Historical Provider, MD  dexamethasone (DECADRON) 4 MG tablet Take 4 mg by mouth 3 (three) times daily. For brain swelling 07/31/15  Yes Historical Provider, MD  DEXILANT 60 MG capsule Take 60 mg by mouth daily.  04/23/15  Yes Historical Provider, MD  glipiZIDE (GLUCOTROL XL) 10 MG 24 hr tablet Take 10 mg by mouth 2 (two) times daily with breakfast and lunch. 06/29/15  Yes Historical Provider, MD  hydrochlorothiazide (HYDRODIURIL) 25 MG tablet Take 25 mg by mouth daily.   Yes Historical Provider, MD  HYDROcodone-acetaminophen (NORCO/VICODIN) 5-325 MG tablet Take 1 tablet by mouth every 6 (six) hours as needed for moderate pain.   Yes Historical Provider, MD  ibuprofen (ADVIL,MOTRIN) 200 MG tablet Take 600 mg by mouth every 6 (six) hours as needed for moderate pain.   Yes Historical Provider, MD  loratadine (CLARITIN) 10 MG tablet Take 10 mg by mouth daily as needed for allergies.    Yes Historical Provider, MD  losartan (COZAAR) 100 MG tablet Take 100 mg by mouth daily.   Yes Historical Provider, MD  metFORMIN (GLUCOPHAGE) 1000 MG tablet Take 1,000 mg by mouth 2 (two) times daily with a meal.  06/02/15  Yes Historical Provider, MD  metoprolol (TOPROL-XL) 50 MG 24 hr tablet Take 100 mg by mouth daily.    Yes Historical Provider, MD  pioglitazone (ACTOS) 30 MG tablet Take 30 mg by mouth daily.   Yes Historical Provider, MD  promethazine (PHENERGAN) 12.5 MG tablet Take 12.5 mg by mouth 3 (three) times daily as needed for nausea or vomiting.   Yes Historical Provider, MD  sitaGLIPtin (JANUVIA) 100 MG tablet Take 100 mg by mouth daily.   Yes Historical Provider, MD  tiZANidine (ZANAFLEX) 4 MG tablet Take 4 mg by mouth 3 (three) times daily as needed for muscle spasms.    Yes Historical Provider, MD  VENTOLIN HFA 108 (90 BASE) MCG/ACT inhaler Inhale 2 puffs into the lungs every 6 (six) hours as needed. wheezing 04/10/15  Yes Historical  Provider, MD   BP 125/69 mmHg  Pulse 88  Temp(Src) 97.8 F (36.6 C) (Oral)  Resp 18  SpO2 99% Physical Exam  Constitutional: She is oriented to person, place, and time. She appears well-developed and well-nourished. No distress.  HENT:  Head: Normocephalic and atraumatic.  Right Ear: External ear normal.  Left Ear: External ear normal.  Nose: Nose normal.  Eyes: Conjunctivae and EOM are normal. Pupils are equal, round, and reactive to light. Right eye exhibits no discharge. Left eye exhibits no discharge. No scleral icterus.  Neck: Normal range of motion. Neck supple.  Cardiovascular: Normal rate, regular rhythm and normal heart sounds.  Exam reveals no gallop and no friction rub.   No murmur heard. Pulmonary/Chest: Effort normal and breath sounds normal. No stridor. No respiratory distress. She has no wheezes.  Abdominal: Soft. She exhibits no distension. There is no tenderness.  Musculoskeletal: She exhibits no edema or tenderness.  Neurological: She is alert and oriented to person, place, and time.  Cranial Nerves  II Visual Fields: Intact to confrontation. Visual fields intact. III, IV, VI: Pupils equal and reactive to light and near. Full eye movement without nystagmus  V Facial Sensation: Normal. No weakness of masticatory muscles  VII: No facial weakness or asymmetry  VIII Auditory Acuity: Grossly normal  IX/X: The uvula is midline; the palate elevates symmetrically  XI: Normal sternocleidomastoid and trapezius strength  XII: The tongue is midline. No atrophy or fasciculations.   Motor System: Muscle Strength: 5/5 and symmetric in the upper and lower extremities. No pronation or drift.  Muscle Tone: Tone and muscle bulk are normal in the upper and lower extremities.   Reflexes: DTRs: 1+ and symmetrical in all four extremities. Plantar responses are flexor bilaterally.  Coordination: Intact finger-to-nose, heel-to-shin, and rapid alternating movements. No tremor.   Sensation: Intact to light touch.    Skin: Skin is warm and dry. No rash noted. She is not diaphoretic. No erythema.  Psychiatric: She has a normal mood and affect.    ED Course  Procedures (including critical care time) Labs Review Labs Reviewed  BASIC METABOLIC PANEL - Abnormal; Notable for the following:    Chloride 97 (*)    Glucose, Bld 116 (*)    All other components within normal limits  CBC - Abnormal; Notable for the following:    HCT 34.8 (*)    All other components within normal limits    Imaging Review Mr Jeri Cos Wo Contrast  09/18/2015  CLINICAL DATA:  Known brain tumors, 2 weeks of worsening dizziness and LEFT temporal occipital pain. Three days of blurry vision. History of hypertension, diabetes. EXAM: MRI HEAD WITHOUT AND WITH CONTRAST TECHNIQUE: Multiplanar, multiecho pulse sequences of the brain and surrounding structures were obtained without and with intravenous contrast. CONTRAST:  20 cc MultiHance COMPARISON:  MRI of the brain August 10, 2015 and MRI head July 27, 2015 and MRI head September 27, 2014 FINDINGS: No reduced diffusion to suggest acute ischemia. No susceptibility artifact to suggest hemorrhage. Moderately motion degraded FLAIR sequence, with very mild residual FLAIR hyperintense signal LEFT parietal lobe, markedly improved. Similar FLAIR hyperintense signal mesial RIGHT occipital lobe associated with encephalomalacia. Faint subcentimeter enhancement LEFT temporal lobe, less conspicuous than prior examination. Minimal faint enhancement LEFT parietal convexity of prior area of nodular enhancement. No new areas of abnormal enhancement though, postcontrast sequences are moderately motion degraded. The ventricles and sulci are normal for patient's age. Patchy supratentorial white matter T2 hyperintensities exclusive of the aforementioned abnormality without midline shift or mass effect. No abnormal extra-axial fluid collections, abnormal enhancement or extra-axial  masses. Normal major intracranial vascular flow voids present at the skull base. Ocular globes and orbital contents are nonsuspicious, status post bilateral ocular lens implants. Persistent mastoid effusions. Status post LEFT wall down mastoidectomy. Increasing LEFT sphenoid mucosal thickening without paranasal sinus air-fluid levels. No abnormal sellar expansion. No cerebellar tonsillar ectopia. No suspicious calvarial bone marrow signal. IMPRESSION: No acute intracranial process. Continued resolution of subcentimeter enhancing nodularity LEFT temporal parietal lobes, without new lesions on moderately motion degraded post contrast sequences. Mesial RIGHT posterior frontal lobe infarct. Electronically Signed   By: Elon Alas M.D.   On: 09/18/2015 00:21   I have personally reviewed and evaluated these images and lab results as part of my medical decision-making.   EKG Interpretation None      MDM   70 year old female with an extensive past medical  history significant for recent brain lesions that appear to be improving on serial MRIs who is currently being followed by neurosurgery presents today with blurry vision and dizziness. She is also endorsing similar headache and left-sided neck pain to prior presentations. History as above.  1. neck pain and headache. On exam the patient has tenderness to palpation of the left cervical muscles as well as her TMJ. Patient given tramadol and Robaxin for her symptoms.  2. Blurry vision and dizziness in the setting of known brain lesions. Patient called neurosurgery office who recommended she present to the ED for evaluation. Exam nonfocal. Visual fields intact. Labs nonspecific. MRI obtained revealed continually improving lesions. I spoke with Dr. Annette Stable who is the on-call neurosurgeon for their practice. He felt that close follow-up in clinic would be appropriate. I discussed this with the patient and her husband who were also in agreement.  Diagnostic  studies interpreted by me and use to my clinical decision-making.  Patient is safe for discharge with strict return precautions. She will will call the neurosurgery clinic in the morning to establish follow-up.  Patient was seen in conjunction with Dr. Ashok Cordia.    Final diagnoses:  Blurry vision  Dizziness  Nonintractable episodic headache, unspecified headache type        Addison Lank, MD 09/18/15 [place], MD 09/18/15 2300

## 2015-09-18 MED ORDER — GADOBENATE DIMEGLUMINE 529 MG/ML IV SOLN
20.0000 mL | Freq: Once | INTRAVENOUS | Status: AC | PRN
  Administered 2015-09-17: 20 mL via INTRAVENOUS

## 2015-09-18 NOTE — Discharge Instructions (Signed)
Dizziness Dizziness is a common problem. It makes you feel unsteady or lightheaded. You may feel like you are about to pass out (faint). Dizziness can lead to injury if you stumble or fall. Anyone can get dizzy, but dizziness is more common in older adults. This condition can be caused by a number of things, including:  Medicines.  Dehydration.  Illness. HOME CARE Following these instructions may help with your condition: Eating and Drinking  Drink enough fluid to keep your pee (urine) clear or pale yellow. This helps to keep you from getting dehydrated. Try to drink more clear fluids, such as water.  Do not drink alcohol.  Limit how much caffeine you drink or eat if told by your doctor.  Limit how much salt you drink or eat if told by your doctor. Activity  Avoid making quick movements.  When you stand up from sitting in a chair, steady yourself until you feel okay.  In the morning, first sit up on the side of the bed. When you feel okay, stand slowly while you hold onto something. Do this until you know that your balance is fine.  Move your legs often if you need to stand in one place for a long time. Tighten and relax your muscles in your legs while you are standing.  Do not drive or use heavy machinery if you feel dizzy.  Avoid bending down if you feel dizzy. Place items in your home so that they are easy for you to reach without leaning over. Lifestyle  Do not use any tobacco products, including cigarettes, chewing tobacco, or electronic cigarettes. If you need help quitting, ask your doctor.  Try to lower your stress level, such as with yoga or meditation. Talk with your doctor if you need help. General Instructions  Watch your dizziness for any changes.  Take medicines only as told by your doctor. Talk with your doctor if you think that your dizziness is caused by a medicine that you are taking.  Tell a friend or a family member that you are feeling dizzy. If he or  she notices any changes in your behavior, have this person call your doctor.  Keep all follow-up visits as told by your doctor. This is important. GET HELP IF:  Your dizziness does not go away.  Your dizziness or light-headedness gets worse.  You feel sick to your stomach (nauseous).  You have trouble hearing.  You have new symptoms.  You are unsteady on your feet or you feel like the room is spinning. GET HELP RIGHT AWAY IF:  You throw up (vomit) or have diarrhea and are unable to eat or drink anything.  You have trouble:  Talking.  Walking.  Swallowing.  Using your arms, hands, or legs.  You feel generally weak.  You are not thinking clearly or you have trouble forming sentences. It may take a friend or family member to notice this.  You have:  Chest pain.  Pain in your belly (abdomen).  Shortness of breath.  Sweating.  Your vision changes.  You are bleeding.  You have a headache.  You have neck pain or a stiff neck.  You have a fever.   This information is not intended to replace advice given to you by your health care provider. Make sure you discuss any questions you have with your health care provider.   Document Released: 06/26/2011 Document Revised: 11/21/2014 Document Reviewed: 07/03/2014 Elsevier Interactive Patient Education 2016 [place] Having blurred vision  means that you cannot see things clearly. Your vision may seem fuzzy or out of focus. Blurred vision is a very common symptom of an eye or vision problem. Blurred vision is often a gradual blur that occurs in one eye or both eyes. There are many causes of blurred vision, including cataracts, macular degeneration, and diabetic retinopathy. Blurred vision can be diagnosed based on your symptoms and a physical exam. Tell your health care provider about any other health problems you have, any recent eye injury, and any prior surgeries. You may need to see a health care  provider who specializes in eye problems (ophthalmologist). Your treatment depends on what is causing your blurred vision.  HOME CARE INSTRUCTIONS  Tell your health care provider about any changes in your blurred vision.  Do not drive or operate heavy machinery if your vision is blurry.  Keep all follow-up visits as directed by your health care provider. This is important. SEEK MEDICAL CARE IF:  Your symptoms get worse.  You have new symptoms.  You have trouble seeing at night.  You have trouble seeing up close or far away.  You have trouble noticing the difference between colors. SEEK IMMEDIATE MEDICAL CARE IF:  You have severe eye pain.  You have a severe headache.  You have flashing lights in your field of vision.  You have a sudden change in vision.  You have a sudden loss of vision.  You have vision change after an injury.  You notice drainage coming from your eyes.  You notice a rash around your eyes.   This information is not intended to replace advice given to you by your health care provider. Make sure you discuss any questions you have with your health care provider.   Document Released: 07/10/2003 Document Revised: 11/21/2014 Document Reviewed: 05/31/2014 Elsevier Interactive Patient Education [employer].

## 2015-09-20 ENCOUNTER — Encounter: Payer: Self-pay | Admitting: Neurology

## 2015-09-20 ENCOUNTER — Ambulatory Visit (INDEPENDENT_AMBULATORY_CARE_PROVIDER_SITE_OTHER): Payer: Medicare Other | Admitting: Neurology

## 2015-09-20 VITALS — BP 130/64 | HR 94 | Ht 65.0 in | Wt 205.0 lb

## 2015-09-20 DIAGNOSIS — R9089 Other abnormal findings on diagnostic imaging of central nervous system: Secondary | ICD-10-CM

## 2015-09-20 DIAGNOSIS — I679 Cerebrovascular disease, unspecified: Secondary | ICD-10-CM | POA: Diagnosis not present

## 2015-09-20 DIAGNOSIS — E785 Hyperlipidemia, unspecified: Secondary | ICD-10-CM

## 2015-09-20 DIAGNOSIS — I69959 Hemiplegia and hemiparesis following unspecified cerebrovascular disease affecting unspecified side: Secondary | ICD-10-CM | POA: Diagnosis not present

## 2015-09-20 DIAGNOSIS — F17200 Nicotine dependence, unspecified, uncomplicated: Secondary | ICD-10-CM

## 2015-09-20 DIAGNOSIS — R930 Abnormal findings on diagnostic imaging of skull and head, not elsewhere classified: Secondary | ICD-10-CM

## 2015-09-20 MED ORDER — VALIUM 5 MG PO TABS: 5.0000 mg | ORAL_TABLET | Freq: Once | ORAL | Status: DC

## 2015-09-20 MED ORDER — TIZANIDINE HCL 2 MG PO TABS: 2.0000 mg | ORAL_TABLET | Freq: Three times a day (TID) | ORAL | Status: DC | PRN
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
## 2015-09-20 NOTE — Progress Notes (Signed)
Chart forwarded.  

## 2015-09-20 NOTE — Patient Instructions (Signed)
1.  Will get MRA and MRV of head.  Also will get MRA of neck 2.  Check fasting lipid panel 3.  For neck pain, will try tizanidine '2mg'$  up to three times daily as needed.  Caution for drowsiness, so first try taking it at bedtime to see how you do 4.  Follow up with ophthalmologist

## 2015-09-24 ENCOUNTER — Encounter (HOSPITAL_COMMUNITY): Payer: Self-pay | Admitting: Emergency Medicine

## 2015-09-24 ENCOUNTER — Emergency Department (HOSPITAL_COMMUNITY): Payer: Medicare Other

## 2015-09-24 ENCOUNTER — Emergency Department (HOSPITAL_COMMUNITY)
Admission: EM | Admit: 2015-09-24 | Discharge: 2015-09-25 | Disposition: A | Discharge status: Home / Self Care | Payer: Medicare Other | Source: Home / Self Care | Attending: Physician Assistant | Admitting: Physician Assistant

## 2015-09-24 DIAGNOSIS — R2681 Unsteadiness on feet: Secondary | ICD-10-CM | POA: Insufficient documentation

## 2015-09-24 DIAGNOSIS — R531 Weakness: Secondary | ICD-10-CM | POA: Insufficient documentation

## 2015-09-24 DIAGNOSIS — D496 Neoplasm of unspecified behavior of brain: Secondary | ICD-10-CM | POA: Diagnosis not present

## 2015-09-24 DIAGNOSIS — Z9981 Dependence on supplemental oxygen: Secondary | ICD-10-CM

## 2015-09-24 DIAGNOSIS — Z79899 Other long term (current) drug therapy: Secondary | ICD-10-CM | POA: Insufficient documentation

## 2015-09-24 DIAGNOSIS — F419 Anxiety disorder, unspecified: Secondary | ICD-10-CM | POA: Diagnosis present

## 2015-09-24 DIAGNOSIS — K219 Gastro-esophageal reflux disease without esophagitis: Secondary | ICD-10-CM | POA: Diagnosis present

## 2015-09-24 DIAGNOSIS — E785 Hyperlipidemia, unspecified: Secondary | ICD-10-CM | POA: Diagnosis present

## 2015-09-24 DIAGNOSIS — R278 Other lack of coordination: Secondary | ICD-10-CM | POA: Insufficient documentation

## 2015-09-24 DIAGNOSIS — I10 Essential (primary) hypertension: Secondary | ICD-10-CM

## 2015-09-24 DIAGNOSIS — E119 Type 2 diabetes mellitus without complications: Secondary | ICD-10-CM | POA: Insufficient documentation

## 2015-09-24 DIAGNOSIS — Z8673 Personal history of transient ischemic attack (TIA), and cerebral infarction without residual deficits: Secondary | ICD-10-CM

## 2015-09-24 DIAGNOSIS — D869 Sarcoidosis, unspecified: Secondary | ICD-10-CM | POA: Diagnosis present

## 2015-09-24 DIAGNOSIS — G4733 Obstructive sleep apnea (adult) (pediatric): Secondary | ICD-10-CM | POA: Diagnosis present

## 2015-09-24 DIAGNOSIS — F17210 Nicotine dependence, cigarettes, uncomplicated: Secondary | ICD-10-CM

## 2015-09-24 DIAGNOSIS — G9389 Other specified disorders of brain: Secondary | ICD-10-CM

## 2015-09-24 DIAGNOSIS — E876 Hypokalemia: Secondary | ICD-10-CM | POA: Diagnosis present

## 2015-09-24 DIAGNOSIS — M1990 Unspecified osteoarthritis, unspecified site: Secondary | ICD-10-CM | POA: Insufficient documentation

## 2015-09-24 DIAGNOSIS — Z7984 Long term (current) use of oral hypoglycemic drugs: Secondary | ICD-10-CM

## 2015-09-24 DIAGNOSIS — Z7982 Long term (current) use of aspirin: Secondary | ICD-10-CM

## 2015-09-24 DIAGNOSIS — E7800 Pure hypercholesterolemia, unspecified: Secondary | ICD-10-CM

## 2015-09-24 DIAGNOSIS — E669 Obesity, unspecified: Secondary | ICD-10-CM | POA: Insufficient documentation

## 2015-09-24 DIAGNOSIS — N390 Urinary tract infection, site not specified: Secondary | ICD-10-CM | POA: Diagnosis not present

## 2015-09-24 DIAGNOSIS — B952 Enterococcus as the cause of diseases classified elsewhere: Secondary | ICD-10-CM | POA: Diagnosis not present

## 2015-09-24 DIAGNOSIS — R42 Dizziness and giddiness: Secondary | ICD-10-CM | POA: Diagnosis not present

## 2015-09-24 DIAGNOSIS — R000 Tachycardia, unspecified: Secondary | ICD-10-CM | POA: Diagnosis not present

## 2015-09-24 DIAGNOSIS — J449 Chronic obstructive pulmonary disease, unspecified: Secondary | ICD-10-CM | POA: Diagnosis present

## 2015-09-24 DIAGNOSIS — G4730 Sleep apnea, unspecified: Secondary | ICD-10-CM

## 2015-09-24 DIAGNOSIS — R52 Pain, unspecified: Secondary | ICD-10-CM | POA: Diagnosis not present

## 2015-09-24 DIAGNOSIS — H538 Other visual disturbances: Secondary | ICD-10-CM

## 2015-09-24 DIAGNOSIS — R4182 Altered mental status, unspecified: Secondary | ICD-10-CM | POA: Diagnosis not present

## 2015-09-24 DIAGNOSIS — I951 Orthostatic hypotension: Secondary | ICD-10-CM | POA: Diagnosis not present

## 2015-09-24 DIAGNOSIS — R51 Headache: Secondary | ICD-10-CM | POA: Diagnosis not present

## 2015-09-24 LAB — URINALYSIS, ROUTINE W REFLEX MICROSCOPIC
BILIRUBIN URINE: NEGATIVE
Glucose, UA: NEGATIVE mg/dL
HGB URINE DIPSTICK: NEGATIVE
Ketones, ur: NEGATIVE mg/dL
Leukocytes, UA: NEGATIVE
Nitrite: NEGATIVE
PH: 6 (ref 5.0–8.0)
Protein, ur: NEGATIVE mg/dL
SPECIFIC GRAVITY, URINE: 1.015 (ref 1.005–1.030)

## 2015-09-24 LAB — CBC
HEMATOCRIT: 36.8 % (ref 36.0–46.0)
HEMOGLOBIN: 12.7 g/dL (ref 12.0–15.0)
MCH: 30.3 pg (ref 26.0–34.0)
MCHC: 34.5 g/dL (ref 30.0–36.0)
MCV: 87.8 fL (ref 78.0–100.0)
Platelets: 287 10*3/uL (ref 150–400)
RBC: 4.19 MIL/uL (ref 3.87–5.11)
RDW: 14 % (ref 11.5–15.5)
WBC: 7.3 10*3/uL (ref 4.0–10.5)

## 2015-09-24 LAB — BASIC METABOLIC PANEL
ANION GAP: 14 (ref 5–15)
BUN: 12 mg/dL (ref 6–20)
CHLORIDE: 97 mmol/L — AB (ref 101–111)
CO2: 27 mmol/L (ref 22–32)
Calcium: 10.4 mg/dL — ABNORMAL HIGH (ref 8.9–10.3)
Creatinine, Ser: 0.88 mg/dL (ref 0.44–1.00)
GFR calc Af Amer: >60 mL/min (ref >60)
GFR calc non Af Amer: >60 mL/min (ref >60)
GLUCOSE: 115 mg/dL — AB (ref 65–99)
POTASSIUM: 3.2 mmol/L — AB (ref 3.5–5.1)
Sodium: 138 mmol/L (ref 135–145)

## 2015-09-24 LAB — PROTIME-INR
INR: 1.02 (ref 0.00–1.49)
Prothrombin Time: 13.6 seconds (ref 11.6–15.2)

## 2015-09-24 MED ORDER — GADOBENATE DIMEGLUMINE 529 MG/ML IV SOLN
18.0000 mL | Freq: Once | INTRAVENOUS | Status: AC | PRN
  Administered 2015-09-24: 18 mL via INTRAVENOUS

## 2015-09-24 MED ORDER — LORAZEPAM 2 MG/ML IJ SOLN
1.0000 mg | Freq: Once | INTRAMUSCULAR | Status: AC
  Administered 2015-09-24: 1 mg via INTRAVENOUS
  Filled 2015-09-24: qty 1

## 2015-09-24 NOTE — ED Notes (Signed)
Placed patient on bedpan

## 2015-09-24 NOTE — ED Notes (Signed)
Dr. Thomasene Lot at bedside at this time.

## 2015-09-24 NOTE — ED Notes (Signed)
Patient comes from home states she has had blurred vision and headache x2 weeks. Patient states pain to the left frontal and move temporal. Patient endorse double vision. Patient states she was referred to a neurologist and was sent for MRI on Monday and states results were negative. Patient alert and oriented on arrival able to move all extremities.

## 2015-09-24 NOTE — ED Notes (Signed)
Patient transported to MRI via stretcher.

## 2015-09-24 NOTE — Discharge Instructions (Signed)
You were seen today for dizziness and blurred vision that you've had for a number of weeks. We want you to follow up again with neurology. We've given U the phone number you should also follow-up with neurosurgeon, Dr. Arnoldo Morale. Call tomorrow to the neurologist to see if they want to start you on dexamethasone, a medication used for swelling in your brain. Left them know that you are allergic to mometasone.

## 2015-09-24 NOTE — ED Provider Notes (Addendum)
CSN: [id number]     Arrival date & time 09/24/15  1632 History   First MD Initiated Contact with Patient 09/24/15 1652     Chief Complaint  Patient presents with  . Headache     (Consider location/radiation/quality/duration/timing/severity/associated sxs/prior Treatment) HPI   She is a 70 year old female presenting with neurologic symptoms. Patient complains of blurry vision, unsteady gait. This has been worsening since last night. Patietn has dysmetria on exam here, not noted in recent neurologic exam.   Below is the recent neurologic history.   Summary of patients neurologic history 07/27/15, lesion in the left posterior frontal/parietal lobe with vasogenic edema, as well as non-enhancing signal abnormality in left temporal lobe.  She was evaluated by Dr. Lavera Guise of oncology. CT of the chest was negative for primary lesion.  08/10/15 MRI by Arnoldo Morale neurosurgery showed interval decrease in the enhancing lesions and decrease vasogenic edema in the left parietal and posterior frontal region and 1 cm hemorrhagic minimally enhancing lesion was noted in the left temporal lobe.   Early February- on Decadron     09/17/15 ED visit for 1 week duration of dizziness and blurred vision.MRI  showed continued resolution of the nodular enhancement in the left parietal region and left temporal lobe with no new lesions noted.   3/2 Visit to neurology, MRI MRA ordered.  3/6 back to the ED with worsening instability/dysmetria.   Past Medical History  Diagnosis Date  . Diabetes mellitus   . Hypertension   . High cholesterol   . Stroke ([place])     x2 12, 16  . Sleep apnea     cpap  . COPD (chronic obstructive pulmonary disease) (HCC)     sarcoidosis  . Shortness of breath dyspnea   . Anxiety   . GERD (gastroesophageal reflux disease)   . Arthritis    Past Surgical History  Procedure Laterality Date  . Neck surgery    . Back surgery    . Cholecystectomy    . Abdominal hysterectomy     . Ear examination under anesthesia Left     plate   Family History  Problem Relation Age of Onset  . Liver cancer Father    Social History  Substance Use Topics  . Smoking status: Light Tobacco Smoker -- 0.25 packs/day for 22 years    Types: Cigarettes  . Smokeless tobacco: Never Used     Comment: wants patches to quit  . Alcohol Use: No   OB History    No data available     Review of Systems  Constitutional: Negative for activity change.  HENT: Negative for congestion.   Eyes: Positive for visual disturbance.  Respiratory: Negative for shortness of breath.   Cardiovascular: Negative for chest pain.  Gastrointestinal: Negative for abdominal pain.  Genitourinary: Negative for dysuria.  Musculoskeletal: Negative for back pain.  Neurological: Positive for dizziness, weakness and light-headedness.  Psychiatric/Behavioral: The patient is nervous/anxious.       Allergies  Mometasone; Codeine; and Sulfa antibiotics  Home Medications   Prior to Admission medications   Medication Sig Start Date End Date Taking? Authorizing Provider  albuterol (PROVENTIL) (2.5 MG/3ML) 0.083% nebulizer solution Take 2.5 mg by nebulization 3 (three) times daily as needed for wheezing or shortness of breath.   Yes Historical Provider, MD  ALPRAZolam Duanne Moron) 0.5 MG tablet Take 0.5-1 mg by mouth daily as needed. For anxiety.   Yes Historical Provider, MD  aspirin EC 81 MG tablet Take 81 mg by mouth  daily.    Yes Historical Provider, MD  atorvastatin (LIPITOR) 20 MG tablet Take 40 mg by mouth daily.  06/02/15  Yes Historical Provider, MD  citalopram (CELEXA) 20 MG tablet Take 20 mg by mouth daily.   Yes Historical Provider, MD  DEXILANT 60 MG capsule Take 60 mg by mouth daily as needed (for heartburn).  04/23/15  Yes Historical Provider, MD  glipiZIDE (GLUCOTROL XL) 10 MG 24 hr tablet Take 10 mg by mouth 2 (two) times daily with breakfast and lunch. 06/29/15  Yes Historical Provider, MD   hydrochlorothiazide (HYDRODIURIL) 25 MG tablet Take 25 mg by mouth daily.   Yes Historical Provider, MD  ibuprofen (ADVIL,MOTRIN) 200 MG tablet Take 600 mg by mouth every 6 (six) hours as needed for moderate pain. Reported on 09/20/2015   Yes Historical Provider, MD  loratadine (CLARITIN) 10 MG tablet Take 10 mg by mouth daily as needed for allergies or rhinitis.    Yes Historical Provider, MD  losartan (COZAAR) 100 MG tablet Take 100 mg by mouth daily.   Yes Historical Provider, MD  metFORMIN (GLUCOPHAGE) 1000 MG tablet Take 1,000 mg by mouth 2 (two) times daily with a meal.  06/02/15  Yes Historical Provider, MD  metoprolol (TOPROL-XL) 50 MG 24 hr tablet Take 100 mg by mouth daily.    Yes Historical Provider, MD  pioglitazone (ACTOS) 30 MG tablet Take 30 mg by mouth daily.   Yes Historical Provider, MD  promethazine (PHENERGAN) 12.5 MG tablet Take 12.5 mg by mouth 3 (three) times daily as needed for nausea or vomiting.   Yes Historical Provider, MD  sitaGLIPtin (JANUVIA) 100 MG tablet Take 100 mg by mouth daily.   Yes Historical Provider, MD  tiZANidine (ZANAFLEX) 2 MG tablet Take 1 tablet (2 mg total) by mouth every 8 (eight) hours as needed for muscle spasms. 09/20/15  Yes Adam Telford Nab, DO  VALIUM 5 MG tablet Take 1 tablet (5 mg total) by mouth once. 20-30 minutes before scan 09/20/15   Pieter Partridge, DO  VENTOLIN HFA 108 (90 BASE) MCG/ACT inhaler Inhale 2 puffs into the lungs every 6 (six) hours as needed. wheezing 04/10/15   Historical Provider, MD   BP 129/62 mmHg  Pulse 85  Temp(Src) 98.1 F (36.7 C)  Resp 19  SpO2 94% Physical Exam  Constitutional: She is oriented to person, place, and time. She appears well-developed and well-nourished.  Obese   HENT:  Head: Normocephalic and atraumatic.  Eyes: Conjunctivae are normal. Right eye exhibits no discharge.  Neck: Neck supple.  Cardiovascular: Normal rate, regular rhythm and normal heart sounds.   No murmur heard. Pulmonary/Chest: Effort  normal and breath sounds normal. She has no wheezes. She has no rales.  Abdominal: Soft. She exhibits no distension. There is no tenderness.  Musculoskeletal: Normal range of motion. She exhibits no edema.  Neurological: She is oriented to person, place, and time. No cranial nerve deficit.  On finger to nose : Dysmetria bilateral, R > L,  Heel to shin normal.   EOMI.  Skin: Skin is warm and dry. No rash noted. She is not diaphoretic.  Psychiatric: She has a normal mood and affect.  Nursing note and vitals reviewed.   ED Course  Procedures (including critical care time) Labs Review Labs Reviewed  BASIC METABOLIC PANEL - Abnormal; Notable for the following:    Potassium 3.2 (*)    Chloride 97 (*)    Glucose, Bld 115 (*)    Calcium 10.4 (*)  All other components within normal limits  CBC  URINALYSIS, ROUTINE W REFLEX MICROSCOPIC (NOT AT [facility])  PROTIME-INR  CBG MONITORING, ED    Imaging Review Ct Head Wo Contrast  09/24/2015  CLINICAL DATA:  Blurred vision with left frontal headache for 2 weeks. History of stroke. EXAM: CT HEAD WITHOUT CONTRAST TECHNIQUE: Contiguous axial images were obtained from the base of the skull through the vertex without intravenous contrast. COMPARISON:  CT 07/22/2015.  MRI 08/10/2015 and 09/17/2015. FINDINGS: Brain: There is no evidence of acute intracranial hemorrhage, mass lesion, brain edema or extra-axial fluid collection. The ventricles and subarachnoid spaces are appropriately sized for age. There is no CT evidence of acute cortical infarction. There is stable encephalomalacia medially in the right frontal lobe from a remote infarct. The vasogenic edema noted on the prior CT in the left frontal lobe has resolved, as seen on recent MRIs. Bones/sinuses/visualized face: There are stable postsurgical changes status post left mastoidectomy. A right mastoid effusion is unchanged. Chronic mucosal thickening is present in the left division of the sphenoid sinus.  There are no air-fluid levels. The calvarium is intact. IMPRESSION: 1. No acute intracranial findings. 2. Left frontal lobe process demonstrated on CT 2 months ago demonstrates continued improvement as seen on interval MRIs. Electronically Signed   By: Richardean Sale M.D.   On: 09/24/2015 18:12   I have personally reviewed and evaluated these images and lab results as part of my medical decision-making.   EKG Interpretation   Date/Time:  Monday September 24 2015 16:43:58 EST Ventricular Rate:  90 PR Interval:  175 QRS Duration: 100 QT Interval:  382 QTC Calculation: 467 R Axis:   34 Text Interpretation:  Sinus rhythm Low voltage, precordial leads  Borderline T abnormalities, anterior leads no acute ischemia Confirmed by  Gerald Leitz ([id number]) on 09/24/2015 4:56:14 PM      MDM   Final diagnoses:  [place]    Patient is a 70 year old female with couple. Past medical history both neurologically and neurosurgical he. Patient had lesions seen on MRI which were initially going to be surgically removed but were improving. Therefore patient has been following up with neurology. Patient comes in today with increasing dysmetria. Although it is difficult to tell whether this is really been more of a chronic issue for patient given the length of symptoms.  She is unable to say what is different than last ED visit. This appears to be in the setting of recently taking Decadron and now no longer taking it.  Discussed with neurology.  Awaiting results of MRI. However if MRI shows no acute stroke, plan would be for the patient to potentially go back on Decadron and have her follow-up with Dr. Tomi Likens and Dr. Arnoldo Morale.   Even Budlong Julio Alm, MD 09/24/15 2314  11:36 PM  Patient MRI came back unchanged. Initially discussed with neurology having patient be on Decadron. However patient has anaphylactic reaction to a different steroid listed in our system. I asked patient and patient's family member if  she been on Decadron the past. They are unsure. It is unclear from prior notes. We will hold off on Decadron and have hercall Dr. Georgie Chard office in the morning.  Tye Vigo Julio Alm, MD 09/24/15 2340

## 2015-09-24 NOTE — ED Notes (Signed)
Patient has returned back to room from being out of the department; patient placed back on monitor, continuous pulse oximetry and blood pressure cuff; visitor at bedside

## 2015-09-24 NOTE — ED Notes (Signed)
Patient still not in room at this time.

## 2015-09-25 ENCOUNTER — Inpatient Hospital Stay (HOSPITAL_COMMUNITY)
Admission: EM | Admit: 2015-09-25 | Discharge: 2015-09-28 | DRG: 312 | Disposition: A | Discharge status: Home Health | Payer: Medicare Other | Attending: Internal Medicine | Admitting: Internal Medicine

## 2015-09-25 ENCOUNTER — Emergency Department (HOSPITAL_COMMUNITY): Payer: Medicare Other

## 2015-09-25 ENCOUNTER — Other Ambulatory Visit: Payer: Self-pay | Admitting: *Deleted

## 2015-09-25 ENCOUNTER — Encounter (HOSPITAL_COMMUNITY): Payer: Self-pay

## 2015-09-25 ENCOUNTER — Telehealth: Payer: Self-pay | Admitting: Neurology

## 2015-09-25 DIAGNOSIS — B952 Enterococcus as the cause of diseases classified elsewhere: Secondary | ICD-10-CM | POA: Diagnosis present

## 2015-09-25 DIAGNOSIS — I69959 Hemiplegia and hemiparesis following unspecified cerebrovascular disease affecting unspecified side: Secondary | ICD-10-CM

## 2015-09-25 DIAGNOSIS — E876 Hypokalemia: Secondary | ICD-10-CM | POA: Diagnosis not present

## 2015-09-25 DIAGNOSIS — I951 Orthostatic hypotension: Secondary | ICD-10-CM | POA: Diagnosis present

## 2015-09-25 DIAGNOSIS — E785 Hyperlipidemia, unspecified: Secondary | ICD-10-CM

## 2015-09-25 DIAGNOSIS — H538 Other visual disturbances: Secondary | ICD-10-CM | POA: Diagnosis not present

## 2015-09-25 DIAGNOSIS — E119 Type 2 diabetes mellitus without complications: Secondary | ICD-10-CM | POA: Diagnosis present

## 2015-09-25 DIAGNOSIS — I455 Other specified heart block: Secondary | ICD-10-CM | POA: Clinically undetermined

## 2015-09-25 DIAGNOSIS — R42 Dizziness and giddiness: Secondary | ICD-10-CM | POA: Diagnosis not present

## 2015-09-25 DIAGNOSIS — I10 Essential (primary) hypertension: Secondary | ICD-10-CM | POA: Diagnosis present

## 2015-09-25 DIAGNOSIS — G4733 Obstructive sleep apnea (adult) (pediatric): Secondary | ICD-10-CM | POA: Diagnosis present

## 2015-09-25 DIAGNOSIS — I679 Cerebrovascular disease, unspecified: Secondary | ICD-10-CM

## 2015-09-25 DIAGNOSIS — R9089 Other abnormal findings on diagnostic imaging of central nervous system: Secondary | ICD-10-CM

## 2015-09-25 DIAGNOSIS — N390 Urinary tract infection, site not specified: Secondary | ICD-10-CM

## 2015-09-25 DIAGNOSIS — I639 Cerebral infarction, unspecified: Secondary | ICD-10-CM | POA: Diagnosis present

## 2015-09-25 DIAGNOSIS — D496 Neoplasm of unspecified behavior of brain: Secondary | ICD-10-CM | POA: Diagnosis present

## 2015-09-25 DIAGNOSIS — R000 Tachycardia, unspecified: Secondary | ICD-10-CM | POA: Diagnosis not present

## 2015-09-25 DIAGNOSIS — I6789 Other cerebrovascular disease: Secondary | ICD-10-CM | POA: Diagnosis not present

## 2015-09-25 LAB — CBC WITH DIFFERENTIAL/PLATELET
Basophils Absolute: 0 10*3/uL (ref 0.0–0.1)
Basophils Relative: 0 %
Eosinophils Absolute: 0 10*3/uL (ref 0.0–0.7)
Eosinophils Relative: 1 %
HCT: 35.4 % — ABNORMAL LOW (ref 36.0–46.0)
HEMOGLOBIN: 12.4 g/dL (ref 12.0–15.0)
LYMPHS ABS: 1.7 10*3/uL (ref 0.7–4.0)
LYMPHS PCT: 22 %
MCH: 30.5 pg (ref 26.0–34.0)
MCHC: 35 g/dL (ref 30.0–36.0)
MCV: 87.2 fL (ref 78.0–100.0)
Monocytes Absolute: 0.4 10*3/uL (ref 0.1–1.0)
Monocytes Relative: 5 %
NEUTROS PCT: 72 %
Neutro Abs: 5.4 10*3/uL (ref 1.7–7.7)
Platelets: 311 10*3/uL (ref 150–400)
RBC: 4.06 MIL/uL (ref 3.87–5.11)
RDW: 14 % (ref 11.5–15.5)
WBC: 7.6 10*3/uL (ref 4.0–10.5)

## 2015-09-25 LAB — I-STAT CG4 LACTIC ACID, ED
LACTIC ACID, VENOUS: 1.37 mmol/L (ref 0.5–2.0)
LACTIC ACID, VENOUS: 0.62 mmol/L (ref 0.5–2.0)

## 2015-09-25 LAB — BASIC METABOLIC PANEL
ANION GAP: 12 (ref 5–15)
BUN: 14 mg/dL (ref 6–20)
CHLORIDE: 95 mmol/L — AB (ref 101–111)
CO2: 29 mmol/L (ref 22–32)
Calcium: 10.1 mg/dL (ref 8.9–10.3)
Creatinine, Ser: 0.96 mg/dL (ref 0.44–1.00)
GFR calc Af Amer: >60 mL/min (ref >60)
GFR calc non Af Amer: 60 mL/min — ABNORMAL LOW (ref >60)
GLUCOSE: 167 mg/dL — AB (ref 65–99)
POTASSIUM: 2.8 mmol/L — AB (ref 3.5–5.1)
Sodium: 136 mmol/L (ref 135–145)

## 2015-09-25 LAB — URINE MICROSCOPIC-ADD ON

## 2015-09-25 LAB — URINALYSIS, ROUTINE W REFLEX MICROSCOPIC
Glucose, UA: NEGATIVE mg/dL
HGB URINE DIPSTICK: NEGATIVE
Ketones, ur: 15 mg/dL — AB
Nitrite: NEGATIVE
PROTEIN: 30 mg/dL — AB
Specific Gravity, Urine: 1.023 (ref 1.005–1.030)
pH: 5.5 (ref 5.0–8.0)

## 2015-09-25 LAB — RAPID URINE DRUG SCREEN, HOSP PERFORMED
Amphetamines: NOT DETECTED
BENZODIAZEPINES: POSITIVE — AB
Barbiturates: NOT DETECTED
Cocaine: NOT DETECTED
Opiates: NOT DETECTED
Tetrahydrocannabinol: NOT DETECTED

## 2015-09-25 LAB — MAGNESIUM: MAGNESIUM: 1.5 mg/dL — AB (ref 1.7–2.4)

## 2015-09-25 LAB — I-STAT TROPONIN, ED: Troponin i, poc: 0.01 ng/mL (ref 0.00–0.08)

## 2015-09-25 LAB — ANTITHROMBIN III: AntiThromb III Func: 108 % (ref 75–120)

## 2015-09-25 MED ORDER — SODIUM CHLORIDE 0.9 % IV BOLUS (SEPSIS): 500.0000 mL | Freq: Once | INTRAVENOUS | Status: DC

## 2015-09-25 MED ORDER — SODIUM CHLORIDE 0.9 % IV BOLUS (SEPSIS)
500.0000 mL | Freq: Once | INTRAVENOUS | Status: AC
  Administered 2015-09-25: 500 mL via INTRAVENOUS

## 2015-09-25 MED ORDER — ASPIRIN 81 MG PO CHEW
324.0000 mg | CHEWABLE_TABLET | Freq: Once | ORAL | Status: AC
  Administered 2015-09-25: 324 mg via ORAL
  Filled 2015-09-25: qty 4

## 2015-09-25 MED ORDER — KETOROLAC TROMETHAMINE 30 MG/ML IJ SOLN
30.0000 mg | Freq: Once | INTRAMUSCULAR | Status: AC
  Administered 2015-09-26: 30 mg via INTRAVENOUS
  Filled 2015-09-25: qty 1

## 2015-09-25 MED ORDER — PANTOPRAZOLE SODIUM 40 MG IV SOLR
40.0000 mg | INTRAVENOUS | Status: DC
  Administered 2015-09-26 (×2): 40 mg via INTRAVENOUS
  Filled 2015-09-25 (×2): qty 40

## 2015-09-25 MED ORDER — PROCHLORPERAZINE EDISYLATE 5 MG/ML IJ SOLN
10.0000 mg | Freq: Once | INTRAMUSCULAR | Status: AC
  Administered 2015-09-25: 10 mg via INTRAVENOUS
  Filled 2015-09-25 (×2): qty 2

## 2015-09-25 MED ORDER — ONDANSETRON HCL 4 MG/2ML IJ SOLN
4.0000 mg | Freq: Four times a day (QID) | INTRAMUSCULAR | Status: DC | PRN
  Administered 2015-09-26 – 2015-09-27 (×2): 4 mg via INTRAVENOUS
  Filled 2015-09-25 (×2): qty 2

## 2015-09-25 MED ORDER — POTASSIUM CHLORIDE 10 MEQ/100ML IV SOLN
10.0000 meq | Freq: Once | INTRAVENOUS | Status: AC
  Administered 2015-09-25: 10 meq via INTRAVENOUS
  Filled 2015-09-25: qty 100

## 2015-09-25 MED ORDER — POTASSIUM CHLORIDE CRYS ER 20 MEQ PO TBCR
40.0000 meq | EXTENDED_RELEASE_TABLET | Freq: Once | ORAL | Status: AC
  Administered 2015-09-25: 40 meq via ORAL
  Filled 2015-09-25 (×2): qty 2

## 2015-09-25 MED ORDER — POTASSIUM CHLORIDE IN NACL 40-0.9 MEQ/L-% IV SOLN
INTRAVENOUS | Status: DC
  Administered 2015-09-26: 100 mL/h via INTRAVENOUS
  Filled 2015-09-25 (×2): qty 1000

## 2015-09-25 MED ORDER — MAGNESIUM SULFATE 2 GM/50ML IV SOLN
2.0000 g | Freq: Once | INTRAVENOUS | Status: AC
  Administered 2015-09-25: 2 g via INTRAVENOUS
  Filled 2015-09-25: qty 50

## 2015-09-25 MED ORDER — IPRATROPIUM-ALBUTEROL 0.5-2.5 (3) MG/3ML IN SOLN
3.0000 mL | Freq: Once | RESPIRATORY_TRACT | Status: DC
  Filled 2015-09-25: qty 3

## 2015-09-25 MED ORDER — SODIUM CHLORIDE 0.9 % IV BOLUS (SEPSIS)
1000.0000 mL | Freq: Once | INTRAVENOUS | Status: AC
  Administered 2015-09-25: 1000 mL via INTRAVENOUS

## 2015-09-25 MED ORDER — DEXAMETHASONE SODIUM PHOSPHATE 4 MG/ML IJ SOLN
4.0000 mg | Freq: Four times a day (QID) | INTRAMUSCULAR | Status: DC
  Administered 2015-09-26 – 2015-09-28 (×10): 4 mg via INTRAVENOUS
  Filled 2015-09-25 (×13): qty 1

## 2015-09-25 NOTE — ED Notes (Signed)
Stood pt up to do orthostatic vital signs HR went from 107 to 133 and pt became very dizzy and stated she felt like the room was spinning. This RN helped pt back in bed. MD glick made aware.

## 2015-09-25 NOTE — H&P (Signed)
[facility] History and Physical  Kristi Kramer DGU:[id number] DOB: 05-01-1946 DOA: 09/25/2015  Referring physician: Delora Fuel, MD PCP: Fae Pippin   Chief Complaint: Visual changes.  HPI: Kristi Kramer is a 70 y.o. 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  Per patient, over the past 2 weeks she has been having persistent blurred vision, dizziness and left frontal temporal headache. She denies worsening LE UE weakness or sensory changes. Denies any speech abnormalities or language comprehension issues. She complains of nausea, but denies emesis, abdominal pain, diarrhea or constipation. She states that she has been feeling very dizzy when standing up from a seated or getting up from bed, but denies chest pain, palpitations, diaphoresis, pitting edema of the lower extremities, PND orthopnea.  The patient has been followed by Dr. Tomi Likens of left lower neurology and was supposed to have neurosurgery by Dr. Arnoldo Morale, but the surgery was canceled due to MRI showing interval improvement on the enhancing lesions and decreased vasogenic edema. She was on dexamethasone earlier in February for 2 weeks, but he was tapered off since then.  In the ER, the patient became very hypotensive and tachycardic after standing up. She had to receive IV fluid boluses and we are admitting her for further evaluation and observation.  Extensive imaging done in the past few months and recent workup did not show any significant new abnormality.   Review of Systems:  Constitutional: Positive chills, fatigue. No weight loss, night sweats, Fevers  HEENT:  Positive headaches. Denies Difficulty  swallowing,Tooth/dental problems,Sore throat,  No sneezing, itching, ear ache, nasal congestion, post nasal drip,  Cardio-vascular:  As earlier mentioned. GI:  Positive nausea,  No heartburn, indigestion, abdominal painvomiting, diarrhea, change in bowel habits, loss of appetite  Resp:  Occasional dyspnea, occasional productive cough of yellowish sputum, no wheezing, no hemoptysis. Skin:  no rash or lesions.  GU:  no dysuria, change in color of urine, no urgency or frequency. No flank pain.  Musculoskeletal:  Generalized weakness. Occasional upper and lower back pain. Occasional arthralgias. Psych:  No change in mood or affect. No depression or anxiety. No memory loss.  Neuro: As earlier mentioned. Past Medical History  Diagnosis Date  . Diabetes mellitus   . Hypertension   . High cholesterol   . Stroke ([place])     x2 12, 16  . Sleep apnea     cpap  . COPD (chronic obstructive pulmonary disease) ([facility])     sarcoidosis  . Shortness of breath dyspnea   . Anxiety   . GERD (gastroesophageal reflux disease)   . Arthritis    Past Surgical History  Procedure Laterality Date  . Neck surgery    . Back surgery    . Cholecystectomy    . Abdominal hysterectomy    . Ear examination under anesthesia Left     plate   Social History:  reports that she has been smoking Cigarettes.  She has a 5.5 pack-year smoking history. She has never used smokeless tobacco. She reports that she does not drink alcohol or use illicit drugs.  Allergies  Allergen Reactions  . Mometasone Anaphylaxis    Turns Red  . Codeine Nausea And Vomiting  . Sulfa Antibiotics Nausea And Vomiting  Family History  Problem Relation Age of Onset  . Liver cancer Father     Prior to Admission medications   Medication Sig Start Date End Date Taking? Authorizing Provider  albuterol (PROVENTIL) (2.5 MG/3ML) 0.083% nebulizer solution Take 2.5 mg by nebulization 3 (three) times daily as needed for wheezing or  shortness of breath.   Yes Historical Provider, MD  ALPRAZolam Duanne Moron) 0.5 MG tablet Take 0.5-1 mg by mouth daily as needed. For anxiety.   Yes Historical Provider, MD  aspirin EC 81 MG tablet Take 81 mg by mouth daily.    Yes Historical Provider, MD  atorvastatin (LIPITOR) 20 MG tablet Take 40 mg by mouth daily.  06/02/15  Yes Historical Provider, MD  citalopram (CELEXA) 20 MG tablet Take 20 mg by mouth daily.   Yes Historical Provider, MD  DEXILANT 60 MG capsule Take 60 mg by mouth daily as needed (for heartburn).  04/23/15  Yes Historical Provider, MD  glipiZIDE (GLUCOTROL XL) 10 MG 24 hr tablet Take 10 mg by mouth 2 (two) times daily with breakfast and lunch. 06/29/15  Yes Historical Provider, MD  hydrochlorothiazide (HYDRODIURIL) 25 MG tablet Take 25 mg by mouth daily.   Yes Historical Provider, MD  ibuprofen (ADVIL,MOTRIN) 200 MG tablet Take 600 mg by mouth every 6 (six) hours as needed for moderate pain. Reported on 09/20/2015   Yes Historical Provider, MD  loratadine (CLARITIN) 10 MG tablet Take 10 mg by mouth daily as needed for allergies or rhinitis.    Yes Historical Provider, MD  losartan (COZAAR) 100 MG tablet Take 100 mg by mouth daily.   Yes Historical Provider, MD  metFORMIN (GLUCOPHAGE) 1000 MG tablet Take 1,000 mg by mouth 2 (two) times daily with a meal.  06/02/15  Yes Historical Provider, MD  metoprolol (TOPROL-XL) 50 MG 24 hr tablet Take 100 mg by mouth daily.    Yes Historical Provider, MD  pioglitazone (ACTOS) 30 MG tablet Take 30 mg by mouth daily.   Yes Historical Provider, MD  sitaGLIPtin (JANUVIA) 100 MG tablet Take 100 mg by mouth daily.   Yes Historical Provider, MD  VALIUM 5 MG tablet Take 1 tablet (5 mg total) by mouth once. 20-30 minutes before scan 09/20/15  Yes Adam R Jaffe, DO  VENTOLIN HFA 108 (90 BASE) MCG/ACT inhaler Inhale 2 puffs into the lungs every 6 (six) hours as needed. wheezing 04/10/15  Yes Historical Provider, MD  promethazine (PHENERGAN) 12.5 MG tablet Take  12.5 mg by mouth 3 (three) times daily as needed for nausea or vomiting.    Historical Provider, MD   Physical Exam: Filed Vitals:   09/25/15 2000 09/25/15 2045 09/25/15 2245 09/25/15 2330  BP: 152/68 111/99 169/75 143/75  Pulse: 75 85 83 99  Temp:      TempSrc:      Resp: '18 26 20 21  '$ SpO2: 93% 94% 94% 97%    Wt Readings from Last 3 Encounters:  09/20/15 92.987 kg (205 lb)  08/16/15 87.998 kg (194 lb)  08/14/15 88.134 kg (194 lb 4.8 oz)    General:  Appears calm and comfortable Eyes: PERRL, normal lids, irises & conjunctiva ENT: grossly normal hearing, lips & tongue Neck: no LAD, masses or thyromegaly Cardiovascular: RRR, no m/r/g. No LE edema. Telemetry: SR, no arrhythmias  Respiratory: CTA bilaterally, no w/r/r. Normal respiratory effort. Abdomen: Obese, RUQ cholecystectomy scar, BS +, soft, ntnd Skin: no rash or induration seen on limited exam Musculoskeletal: Decreased tone on LUE, no atrophy. Psychiatric:  grossly normal mood and affect, speech fluent and appropriate Neurologic: Awake, alert, oriented 3, cranial nerves are intact, normal nose to finger, LUE 4/5 weakness, unable to further evaluate lower extremities and gait due to the patient's orthostatic changes.           Labs on Admission:  Basic Metabolic Panel:  Recent Labs Lab 09/24/15 1706 09/25/15 1354 09/25/15 1550  NA 138 136  --   K 3.2* 2.8*  --   CL 97* 95*  --   CO2 27 29  --   GLUCOSE 115* 167*  --   BUN 12 14  --   CREATININE 0.88 0.96  --   CALCIUM 10.4* 10.1  --   MG  --   --  1.5*   CBC:  Recent Labs Lab 09/24/15 1706 09/25/15 1354  WBC 7.3 7.6  NEUTROABS  --  5.4  HGB 12.7 12.4  HCT 36.8 35.4*  MCV 87.8 87.2  PLT 287 311    Radiological Exams on Admission: Ct Head Wo Contrast  09/25/2015  CLINICAL DATA:  Headache 2 months associated with blurry vision and dizziness. History of falls. EXAM: CT HEAD WITHOUT CONTRAST TECHNIQUE: Contiguous axial images were obtained from the  base of the skull through the vertex without intravenous contrast. COMPARISON:  09/24/2015 FINDINGS: There is encephalomalacia in the high right frontal lobe. Global atrophy. Mild chronic ischemic changes in the periventricular white matter. No mass effect, midline shift, or acute hemorrhage. There is fluid in the bilateral mastoid air cells. Mucosal thickening in the left sphenoid sinus. No definite skull fracture. Left-sided ethmoidectomy has been performed. IMPRESSION: Chronic ischemic changes and atrophy. No acute intracranial pathology. There is fluid in the mastoid air cells. Electronically Signed   By: Marybelle Killings M.D.   On: 09/25/2015 16:32   Ct Head Wo Contrast  09/24/2015  CLINICAL DATA:  Blurred vision with left frontal headache for 2 weeks. History of stroke. EXAM: CT HEAD WITHOUT CONTRAST TECHNIQUE: Contiguous axial images were obtained from the base of the skull through the vertex without intravenous contrast. COMPARISON:  CT 07/22/2015.  MRI 08/10/2015 and 09/17/2015. FINDINGS: Brain: There is no evidence of acute intracranial hemorrhage, mass lesion, brain edema or extra-axial fluid collection. The ventricles and subarachnoid spaces are appropriately sized for age. There is no CT evidence of acute cortical infarction. There is stable encephalomalacia medially in the right frontal lobe from a remote infarct. The vasogenic edema noted on the prior CT in the left frontal lobe has resolved, as seen on recent MRIs. Bones/sinuses/visualized face: There are stable postsurgical changes status post left mastoidectomy. A right mastoid effusion is unchanged. Chronic mucosal thickening is present in the left division of the sphenoid sinus. There are no air-fluid levels. The calvarium is intact. IMPRESSION: 1. No acute intracranial findings. 2. Left frontal lobe process demonstrated on CT 2 months ago demonstrates continued improvement as seen on interval MRIs. Electronically Signed   By: Richardean Sale M.D.    On: 09/24/2015 18:12   Mr Angiogram Head Wo Contrast  09/24/2015  EXAM: MRI HEAD WITHOUT AND WITH CONTRAST MRA HEAD WITHOUT CONTRAST MRV HEAD WITHOUT CONTRAST TECHNIQUE: Multiplanar, multiecho pulse sequences of the brain and surrounding structures were obtained without and with intravenous contrast. Angiographic images of the intracranial venous structures were obtained using MRV technique without intravenous contrast. CONTRAST:  18m MULTIHANCE GADOBENATE DIMEGLUMINE 529 MG/ML IV SOLN COMPARISON:  Previous MRI has from 09/17/2015, 08/10/2015, and 07/27/2015. FINDINGS: MRI HEAD: Study is moderate  to severely degraded by motion artifact. No abnormal foci of restricted diffusion to suggest acute intracranial infarct. No definite foci of susceptibility artifact to suggest hemorrhage. Faint curvilinear hypodensity overlying the left parietal region on SWI sequence favored to be artifactual, as no definite FLAIR correlate seen in this region (series 1701, image 27). Minimal residual FLAIR signal abnormality within the left parietal lobe, not significantly changed relative to most recent examination from 09/17/2015, but markedly improved relative to earlier studies (series 10, image 22). FLAIR signal abnormality with associated encephalomalacia within the parasagittal posterior right frontal lobe is stable. No new FLAIR abnormality or vasogenic edema on this motion degraded study. Minimal sub cm residual curvilinear enhancement within the left temporal lobe is likely not significantly changed relative to prior (series 20, image 14). Additional faint nodular enhancement within the left parietal lobe not well seen on this motion degraded study. At the least, this cyst early not worsened as compared to prior. No definite new areas of enhancement on this severely motion degraded study. No other mass lesion. No hydrocephalus. No extra-axial fluid collection. Major intracranial vascular flow voids are grossly maintained.  Craniocervical junction within normal limits. Visualized upper cervical spine grossly unremarkable. Pituitary gland normal. No acute abnormality about the orbits. Mild mucosal thickening within the left sphenoid sinus and ethmoidal air cells. Bilateral mastoid effusions noted, larger on the right. Bone marrow signal intensity within normal limits. No acute scalp soft tissue abnormality. MRA HEAD: ANTERIOR CIRCULATION: Study is severely degraded by motion artifact. Visualized distal cervical segments of the internal carotid arteries are patent with antegrade flow. Petrous, cavernous, and supraclinoid segments are widely patent without high-grade stenosis or occlusion. A1 segments patent. Anterior communicating artery grossly normal. Anterior cerebral arteries are markedly attenuated by motion artifact and not well evaluated, but grossly patent to their distal aspects. M1 segments patent without occlusion or definite stenosis. MCA bifurcations grossly normal. Proximal MCA branches patent. Distal MCA branches not well evaluated on this exam. POSTERIOR CIRCULATION: Vertebral arteries patent to the vertebrobasilar junction. Left vertebral artery dominant. Left posterior inferior cerebral artery patent. Right posterior inferior cerebral artery not visualized. Basilar artery widely patent. Superior cerebellar arteries well opacified. Both posterior cerebral arteries arise from the basilar artery. PCAs are grossly patent to their min aspects, not well evaluated distally. No definite aneurysm. MRV HEAD: Dedicated MRV images demonstrate no filling defect to suggest acute venous sinus thrombosis. There is mild irregularity and attenuation of the mid aspect of the superior sagittal sinus, which may reflect sequela of remote thrombosis with re- cannulization (series 3 1501, image 13). Again, no convincing evidence for acute venous sinus thrombosis at this time. The transverse and sigmoid sinuses are widely patent. Proximal  internal jugular veins are widely patent. The visualized proximal internal jugular veins are patent bilaterally. Straight sinus, vein of Galen, and internal cerebral veins appear patent. IMPRESSION: MRI HEAD IMPRESSION: 1. Severely motion degraded study. 2. No new acute intracranial process. 3. No significant interval change in FLAIR signal abnormality within the left parietal lobe. Previously seen associated faint nodular enhancement within this region not well seen on this motion degraded study. Faint subcentimeter curvilinear enhancement within the left temporal lobe likely not significantly changed. MRA HEAD IMPRESSION: 1. Severely motion degraded study. 2. No large or proximal arterial branch occlusion. No definite high-grade or correctable stenosis. Distal medium and small vessels are not well evaluated on this exam. MRV HEAD IMPRESSION: 1. Mildly motion degraded study. 2. Irregularity with attenuation of the mid aspect  of the superior sagittal sinus, which may reflect sequela of remote venous sinus thrombosis with subsequent re- cannulization. 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 No filling defect or convincing evidence for acute venous sinus thrombosis at this time. 3. Otherwise normal MRV of the brain. Electronically Signed   By: Jeannine Boga M.D.   On: 09/24/2015 23:27   Mr Jeri Cos RX Contrast  09/24/2015  EXAM: MRI HEAD WITHOUT AND WITH CONTRAST MRA HEAD WITHOUT CONTRAST MRV HEAD WITHOUT CONTRAST TECHNIQUE: Multiplanar, multiecho pulse sequences of the brain and surrounding structures were obtained without and with intravenous contrast. Angiographic images of the intracranial venous structures were obtained using MRV technique without intravenous contrast. CONTRAST:  59m MULTIHANCE GADOBENATE DIMEGLUMINE 529 MG/ML IV SOLN COMPARISON:  Previous MRI has from  09/17/2015, 08/10/2015, and 07/27/2015. FINDINGS: MRI HEAD: Study is moderate to severely degraded by motion artifact. No abnormal foci of restricted diffusion to suggest acute intracranial infarct. No definite foci of susceptibility artifact to suggest hemorrhage. Faint curvilinear hypodensity overlying the left parietal region on SWI sequence favored to be artifactual, as no definite FLAIR correlate seen in this region (series 1701, image 27). Minimal residual FLAIR signal abnormality within the left parietal lobe, not significantly changed relative to most recent examination from 09/17/2015, but markedly improved relative to earlier studies (series 10, image 22). FLAIR signal abnormality with associated encephalomalacia within the parasagittal posterior right frontal lobe is stable. No new FLAIR abnormality or vasogenic edema on this motion degraded study. Minimal sub cm residual curvilinear enhancement within the left temporal lobe is likely not significantly changed relative to prior (series 20, image 14). Additional faint nodular enhancement within the left parietal lobe not well seen on this motion degraded study. At the least, this cyst early not worsened as compared to prior. No definite new areas of enhancement on this severely motion degraded study. No other mass lesion. No hydrocephalus. No extra-axial fluid collection. Major intracranial vascular flow voids are grossly maintained. Craniocervical junction within normal limits. Visualized upper cervical spine grossly unremarkable. Pituitary gland normal. No acute abnormality about the orbits. Mild mucosal thickening within the left sphenoid sinus and ethmoidal air cells. Bilateral mastoid effusions noted, larger on the right. Bone marrow signal intensity within normal limits. No acute scalp soft tissue abnormality. MRA HEAD: ANTERIOR CIRCULATION: Study is severely degraded by motion artifact. Visualized distal cervical segments of the internal carotid  arteries are patent with antegrade flow. Petrous, cavernous, and supraclinoid segments are widely patent without high-grade stenosis or occlusion. A1 segments patent. Anterior communicating artery grossly normal. Anterior cerebral arteries are markedly attenuated by motion artifact and not well evaluated, but grossly patent to their distal aspects. M1 segments patent without occlusion or definite stenosis. MCA bifurcations grossly normal. Proximal MCA branches patent. Distal MCA branches not well evaluated on this exam. POSTERIOR CIRCULATION: Vertebral arteries patent to the vertebrobasilar junction. Left vertebral artery dominant. Left posterior inferior cerebral artery patent. Right posterior inferior cerebral artery not visualized. Basilar artery widely patent. Superior cerebellar arteries well opacified. Both posterior cerebral arteries arise from the basilar artery. PCAs are grossly patent to their min aspects, not well evaluated distally. No definite aneurysm. MRV HEAD: Dedicated MRV images demonstrate no filling defect to suggest acute venous sinus thrombosis. There is mild irregularity and attenuation of the mid aspect of the superior sagittal sinus, which may reflect sequela of remote thrombosis with  re- cannulization (series 3 1501, image 13). Again, no convincing evidence for acute venous sinus thrombosis at this time. The transverse and sigmoid sinuses are widely patent. Proximal internal jugular veins are widely patent. The visualized proximal internal jugular veins are patent bilaterally. Straight sinus, vein of Galen, and internal cerebral veins appear patent. IMPRESSION: MRI HEAD IMPRESSION: 1. Severely motion degraded study. 2. No new acute intracranial process. 3. No significant interval change in FLAIR signal abnormality within the left parietal lobe. Previously seen associated faint nodular enhancement within this region not well seen on this motion degraded study. Faint subcentimeter curvilinear  enhancement within the left temporal lobe likely not significantly changed. MRA HEAD IMPRESSION: 1. Severely motion degraded study. 2. No large or proximal arterial branch occlusion. No definite high-grade or correctable stenosis. Distal medium and small vessels are not well evaluated on this exam. MRV HEAD IMPRESSION: 1. Mildly motion degraded study. 2. Irregularity with attenuation of the mid aspect of the superior sagittal sinus, which may reflect sequela of remote venous sinus thrombosis with subsequent re- cannulization. 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 No filling defect or convincing evidence for acute venous sinus thrombosis at this time. 3. Otherwise normal MRV of the brain. Electronically Signed   By: Jeannine Boga M.D.   On: 09/24/2015 23:27   Dg Chest Port 1 View  09/25/2015  CLINICAL DATA:  Confusion, tachycardia, dizziness EXAM: PORTABLE CHEST 1 VIEW COMPARISON:  CT chest 07/31/2015 FINDINGS: The heart size and mediastinal contours are within normal limits. Both lungs are clear. The visualized skeletal structures are unremarkable. IMPRESSION: No active disease. Electronically Signed   By: Kathreen Devoid   On: 09/25/2015 16:13   Mr Mrv Head Wo Cm  09/24/2015  EXAM: MRI HEAD WITHOUT AND WITH CONTRAST MRA HEAD WITHOUT CONTRAST MRV HEAD WITHOUT CONTRAST TECHNIQUE: Multiplanar, multiecho pulse sequences of the brain and surrounding structures were obtained without and with intravenous contrast. Angiographic images of the intracranial venous structures were obtained using MRV technique without intravenous contrast. CONTRAST:  52m MULTIHANCE GADOBENATE DIMEGLUMINE 529 MG/ML IV SOLN COMPARISON:  Previous MRI has from 09/17/2015, 08/10/2015, and 07/27/2015. FINDINGS: MRI HEAD: Study is moderate to severely degraded by motion artifact. No abnormal foci of restricted  diffusion to suggest acute intracranial infarct. No definite foci of susceptibility artifact to suggest hemorrhage. Faint curvilinear hypodensity overlying the left parietal region on SWI sequence favored to be artifactual, as no definite FLAIR correlate seen in this region (series 1701, image 27). Minimal residual FLAIR signal abnormality within the left parietal lobe, not significantly changed relative to most recent examination from 09/17/2015, but markedly improved relative to earlier studies (series 10, image 22). FLAIR signal abnormality with associated encephalomalacia within the parasagittal posterior right frontal lobe is stable. No new FLAIR abnormality or vasogenic edema on this motion degraded study. Minimal sub cm residual curvilinear enhancement within the left temporal lobe is likely not significantly changed relative to prior (series 20, image 14). Additional faint nodular enhancement within the left parietal lobe not well seen on this motion degraded study. At the least, this cyst early not worsened as compared to prior. No definite new areas of enhancement on this severely motion degraded study. No other mass lesion. No hydrocephalus. No extra-axial fluid collection. Major intracranial vascular flow voids are grossly maintained. Craniocervical junction within normal limits. Visualized upper cervical spine grossly unremarkable. Pituitary gland normal.  No acute abnormality about the orbits. Mild mucosal thickening within the left sphenoid sinus and ethmoidal air cells. Bilateral mastoid effusions noted, larger on the right. Bone marrow signal intensity within normal limits. No acute scalp soft tissue abnormality. MRA HEAD: ANTERIOR CIRCULATION: Study is severely degraded by motion artifact. Visualized distal cervical segments of the internal carotid arteries are patent with antegrade flow. Petrous, cavernous, and supraclinoid segments are widely patent without high-grade stenosis or occlusion. A1  segments patent. Anterior communicating artery grossly normal. Anterior cerebral arteries are markedly attenuated by motion artifact and not well evaluated, but grossly patent to their distal aspects. M1 segments patent without occlusion or definite stenosis. MCA bifurcations grossly normal. Proximal MCA branches patent. Distal MCA branches not well evaluated on this exam. POSTERIOR CIRCULATION: Vertebral arteries patent to the vertebrobasilar junction. Left vertebral artery dominant. Left posterior inferior cerebral artery patent. Right posterior inferior cerebral artery not visualized. Basilar artery widely patent. Superior cerebellar arteries well opacified. Both posterior cerebral arteries arise from the basilar artery. PCAs are grossly patent to their min aspects, not well evaluated distally. No definite aneurysm. MRV HEAD: Dedicated MRV images demonstrate no filling defect to suggest acute venous sinus thrombosis. There is mild irregularity and attenuation of the mid aspect of the superior sagittal sinus, which may reflect sequela of remote thrombosis with re- cannulization (series 3 [id number], image 13). Again, no convincing evidence for acute venous sinus thrombosis at this time. The transverse and sigmoid sinuses are widely patent. Proximal internal jugular veins are widely patent. The visualized proximal internal jugular veins are patent bilaterally. Straight sinus, vein of Galen, and internal cerebral veins appear patent. IMPRESSION: MRI HEAD IMPRESSION: 1. Severely motion degraded study. 2. No new acute intracranial process. 3. No significant interval change in FLAIR signal abnormality within the left parietal lobe. Previously seen associated faint nodular enhancement within this region not well seen on this motion degraded study. Faint subcentimeter curvilinear enhancement within the left temporal lobe likely not significantly changed. MRA HEAD IMPRESSION: 1. Severely motion degraded study. 2. No large or  proximal arterial branch occlusion. No definite high-grade or correctable stenosis. Distal medium and small vessels are not well evaluated on this exam. MRV HEAD IMPRESSION: 1. Mildly motion degraded study. 2. Irregularity with attenuation of the mid aspect of the superior sagittal sinus, which may reflect sequela of remote venous sinus thrombosis with subsequent re- cannulization. 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 No filling defect or convincing evidence for acute venous sinus thrombosis at this time. 3. Otherwise normal MRV of the brain. Electronically Signed   By: Jeannine Boga M.D.   On: 09/24/2015 23:27    EKG: Independently reviewed. Vent. rate 86 BPM PR interval 172 ms QRS duration 90 ms QT/QTc 410/490 ms P-R-T axes 70 64 90 Normal sinus rhythm Nonspecific T wave abnormality Prolonged QT Abnormal ECG  Assessment/Plan Principal Problem:   Orthostatic hypotension Admit to telemetry observation. Continue IV hydration. Hold antihypertensives for now Monitor blood pressure closely.  Active Problems:   Brain tumors. The case was briefly discussed with neuro hospitalist on-call who is familiar with the case from her previous visits. He suggested to restart the patient on glucocorticoids and refer her to PT/OT. Consider discussing the case in the morning with Dr. Tomi Likens, who is the patient's primary neurologist.    Type 2 diabetes mellitus ([place]) Continue current treatment. Start CBG monitoring  with regular insulin sliding scale while the patient is on Decadron.    Essential hypertension Hold antihypertensives at this time due to orthostatic hypotension Monitor blood pressure closely.    Hypokalemia Will continue supplementation to IV fluids. Continue replacing magnesium. Follow-up potassium level in the morning.    OSA (obstructive  sleep apnea) Continue CPAP with a pressure of 9 cm of H2O.    Hypomagnesemia Continue supplementation with magnesium sulfate. Patient advised to take care of regular oral magnesium supplement at home since she is using hydrochlorothiazide.    Code Status: Full code. DVT Prophylaxis: Lovenox SQ. Family Communication:  Disposition Plan: Admit for IV hydration, Decadron IV, PT/OT evaluation in a.m.  Time spent: Over 70 minutes were spent in the process of his admission.  Reubin Milan, M.D. [facility] Pager [phone].

## 2015-09-25 NOTE — ED Provider Notes (Signed)
CSN: [id number]     Arrival date & time 09/25/15  1314 History   First MD Initiated Contact with Patient 09/25/15 1328     Chief Complaint  Patient presents with  . Dizziness    upon standing     (Consider location/radiation/quality/duration/timing/severity/associated sxs/prior Treatment) HPI   Blood pressure 135/73, pulse 96, temperature 98.1 F (36.7 C), temperature source Oral, resp. rate 24, SpO2 95 %.  Kristi Kramer is a 70 y.o. female brought in by EMS at the request of her ophthalmologist for evaluation of confusion, slightly more lethargic than normal. 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 Tomi Likens and Arnoldo Morale she's had multiple MRIs in the last several weeks, each of which showing decreasing enhancement with no new lesions noticed. Patient was seen for similar symptoms yesterday, states that she has a severe headache today, she denies fevers, chills, cough, shortness of breath, abdominal pain, change in bowel or bladder habits. States that she had an episode of chest pain which she cannot describe that lasted for a very brief time this morning and resolved spontaneously.   Past Medical History  Diagnosis Date  . Diabetes mellitus   . Hypertension   . High cholesterol   . Stroke ([place])     x2 12, 16  . Sleep apnea     cpap  . COPD (chronic obstructive pulmonary disease) ([facility])     sarcoidosis  . Shortness of breath dyspnea   . Anxiety   . GERD (gastroesophageal reflux disease)   . Arthritis    Past Surgical History  Procedure Laterality Date  . Neck surgery    . Back surgery    . Cholecystectomy    . Abdominal hysterectomy    . Ear examination under anesthesia Left     plate   Family History  Problem Relation Age of Onset  . Liver cancer Father    Social History  Substance Use Topics  . Smoking status: Light Tobacco Smoker -- 0.25 packs/day for 22  years    Types: Cigarettes  . Smokeless tobacco: Never Used     Comment: wants patches to quit  . Alcohol Use: No   OB History    No data available     Review of Systems  10 systems reviewed and found to be negative, except as noted in the HPI.  Allergies  Mometasone; Codeine; and Sulfa antibiotics  Home Medications   Prior to Admission medications   Medication Sig Start Date End Date Taking? Authorizing Provider  albuterol (PROVENTIL) (2.5 MG/3ML) 0.083% nebulizer solution Take 2.5 mg by nebulization 3 (three) times daily as needed for wheezing or shortness of breath.    Historical Provider, MD  ALPRAZolam Duanne Moron) 0.5 MG tablet Take 0.5-1 mg by mouth daily as needed. For anxiety.    Historical Provider, MD  aspirin EC 81 MG tablet Take 81 mg by mouth daily.     Historical Provider, MD  atorvastatin (LIPITOR) 20 MG tablet Take 40 mg by mouth daily.  06/02/15   Historical Provider, MD  citalopram (CELEXA) 20 MG tablet Take 20 mg by mouth daily.    Historical Provider, MD  DEXILANT 60 MG capsule Take 60 mg by mouth daily as needed (for heartburn).  04/23/15   Historical Provider, MD  glipiZIDE (GLUCOTROL XL) 10 MG 24 hr tablet Take 10 mg by mouth 2 (two) times daily with breakfast and  lunch. 06/29/15   Historical Provider, MD  hydrochlorothiazide (HYDRODIURIL) 25 MG tablet Take 25 mg by mouth daily.    Historical Provider, MD  ibuprofen (ADVIL,MOTRIN) 200 MG tablet Take 600 mg by mouth every 6 (six) hours as needed for moderate pain. Reported on 09/20/2015    Historical Provider, MD  loratadine (CLARITIN) 10 MG tablet Take 10 mg by mouth daily as needed for allergies or rhinitis.     Historical Provider, MD  losartan (COZAAR) 100 MG tablet Take 100 mg by mouth daily.    Historical Provider, MD  metFORMIN (GLUCOPHAGE) 1000 MG tablet Take 1,000 mg by mouth 2 (two) times daily with a meal.  06/02/15   Historical Provider, MD  metoprolol (TOPROL-XL) 50 MG 24 hr tablet Take 100 mg by mouth  daily.     Historical Provider, MD  pioglitazone (ACTOS) 30 MG tablet Take 30 mg by mouth daily.    Historical Provider, MD  promethazine (PHENERGAN) 12.5 MG tablet Take 12.5 mg by mouth 3 (three) times daily as needed for nausea or vomiting.    Historical Provider, MD  sitaGLIPtin (JANUVIA) 100 MG tablet Take 100 mg by mouth daily.    Historical Provider, MD  tiZANidine (ZANAFLEX) 2 MG tablet Take 1 tablet (2 mg total) by mouth every 8 (eight) hours as needed for muscle spasms. 09/20/15   Pieter Partridge, DO  VALIUM 5 MG tablet Take 1 tablet (5 mg total) by mouth once. 20-30 minutes before scan 09/20/15   Pieter Partridge, DO  VENTOLIN HFA 108 (90 BASE) MCG/ACT inhaler Inhale 2 puffs into the lungs every 6 (six) hours as needed. wheezing 04/10/15   Historical Provider, MD   BP 135/73 mmHg  Pulse 96  Temp(Src) 98.1 F (36.7 C) (Oral)  Resp 24  SpO2 95% Physical Exam  Constitutional: She appears well-developed and well-nourished. No distress.  Obese  HENT:  Head: Normocephalic.  Dry MM  Eyes: Conjunctivae are normal.  Chemically dilated pupils bilaterally  Neck: Normal range of motion.  Cardiovascular: Regular rhythm.   tachycardic  Pulmonary/Chest: Effort normal and breath sounds normal. No stridor. No respiratory distress. She has no wheezes. She has no rales. She exhibits no tenderness.  Abdominal: Soft. There is no tenderness.  Musculoskeletal: Normal range of motion.  Neurological: She is alert.  Patient is somnolent but easily arousable to voice. She is oriented to person and place knowing her name, her date of birth, she lives in, the fact that she is at [facility] in [place] now however she is not oriented to time and states the year is 35 repeatedly.  II-Visual fields grossly intact. V/VII-Smile symmetric, equal eyebrow raise,  facial sensation intact VIII- Hearing grossly intact IX/X-Normal gag XI-bilateral shoulder shrug XII-midline tongue extension Motor: 5/5 bilaterally with  normal tone and bulk Cerebellar: Slight dysmetria on finger to nose of right side  and normal heel-to-shin test.      Psychiatric: She has a normal mood and affect.  Nursing note and vitals reviewed.   ED Course  Procedures (including critical care time) Labs Review Labs Reviewed  CBC WITH DIFFERENTIAL/PLATELET  BASIC METABOLIC PANEL  I-STAT [place], ED    Imaging Review Ct Head Wo Contrast  09/24/2015  CLINICAL DATA:  Blurred vision with left frontal headache for 2 weeks. History of stroke. EXAM: CT HEAD WITHOUT CONTRAST TECHNIQUE: Contiguous axial images were obtained from the base of the skull through the vertex without intravenous contrast. COMPARISON:  CT 07/22/2015.  MRI 08/10/2015 and  09/17/2015. FINDINGS: Brain: There is no evidence of acute intracranial hemorrhage, mass lesion, brain edema or extra-axial fluid collection. The ventricles and subarachnoid spaces are appropriately sized for age. There is no CT evidence of acute cortical infarction. There is stable encephalomalacia medially in the right frontal lobe from a remote infarct. The vasogenic edema noted on the prior CT in the left frontal lobe has resolved, as seen on recent MRIs. Bones/sinuses/visualized face: There are stable postsurgical changes status post left mastoidectomy. A right mastoid effusion is unchanged. Chronic mucosal thickening is present in the left division of the sphenoid sinus. There are no air-fluid levels. The calvarium is intact. IMPRESSION: 1. No acute intracranial findings. 2. Left frontal lobe process demonstrated on CT 2 months ago demonstrates continued improvement as seen on interval MRIs. Electronically Signed   By: Richardean Sale M.D.   On: 09/24/2015 18:12   Mr Angiogram Head Wo Contrast  09/24/2015  EXAM: MRI HEAD WITHOUT AND WITH CONTRAST MRA HEAD WITHOUT CONTRAST MRV HEAD WITHOUT CONTRAST TECHNIQUE: Multiplanar, multiecho pulse sequences of the brain and surrounding structures were obtained  without and with intravenous contrast. Angiographic images of the intracranial venous structures were obtained using MRV technique without intravenous contrast. CONTRAST:  33m MULTIHANCE GADOBENATE DIMEGLUMINE 529 MG/ML IV SOLN COMPARISON:  Previous MRI has from 09/17/2015, 08/10/2015, and 07/27/2015. FINDINGS: MRI HEAD: Study is moderate to severely degraded by motion artifact. No abnormal foci of restricted diffusion to suggest acute intracranial infarct. No definite foci of susceptibility artifact to suggest hemorrhage. Faint curvilinear hypodensity overlying the left parietal region on SWI sequence favored to be artifactual, as no definite FLAIR correlate seen in this region (series 1701, image 27). Minimal residual FLAIR signal abnormality within the left parietal lobe, not significantly changed relative to most recent examination from 09/17/2015, but markedly improved relative to earlier studies (series 10, image 22). FLAIR signal abnormality with associated encephalomalacia within the parasagittal posterior right frontal lobe is stable. No new FLAIR abnormality or vasogenic edema on this motion degraded study. Minimal sub cm residual curvilinear enhancement within the left temporal lobe is likely not significantly changed relative to prior (series 20, image 14). Additional faint nodular enhancement within the left parietal lobe not well seen on this motion degraded study. At the least, this cyst early not worsened as compared to prior. No definite new areas of enhancement on this severely motion degraded study. No other mass lesion. No hydrocephalus. No extra-axial fluid collection. Major intracranial vascular flow voids are grossly maintained. Craniocervical junction within normal limits. Visualized upper cervical spine grossly unremarkable. Pituitary gland normal. No acute abnormality about the orbits. Mild mucosal thickening within the left sphenoid sinus and ethmoidal air cells. Bilateral mastoid  effusions noted, larger on the right. Bone marrow signal intensity within normal limits. No acute scalp soft tissue abnormality. MRA HEAD: ANTERIOR CIRCULATION: Study is severely degraded by motion artifact. Visualized distal cervical segments of the internal carotid arteries are patent with antegrade flow. Petrous, cavernous, and supraclinoid segments are widely patent without high-grade stenosis or occlusion. A1 segments patent. Anterior communicating artery grossly normal. Anterior cerebral arteries are markedly attenuated by motion artifact and not well evaluated, but grossly patent to their distal aspects. M1 segments patent without occlusion or definite stenosis. MCA bifurcations grossly normal. Proximal MCA branches patent. Distal MCA branches not well evaluated on this exam. POSTERIOR CIRCULATION: Vertebral arteries patent to the vertebrobasilar junction. Left vertebral artery dominant. Left posterior inferior cerebral artery patent. Right posterior inferior cerebral artery not visualized.  Basilar artery widely patent. Superior cerebellar arteries well opacified. Both posterior cerebral arteries arise from the basilar artery. PCAs are grossly patent to their min aspects, not well evaluated distally. No definite aneurysm. MRV HEAD: Dedicated MRV images demonstrate no filling defect to suggest acute venous sinus thrombosis. There is mild irregularity and attenuation of the mid aspect of the superior sagittal sinus, which may reflect sequela of remote thrombosis with re- cannulization (series 3 1501, image 13). Again, no convincing evidence for acute venous sinus thrombosis at this time. The transverse and sigmoid sinuses are widely patent. Proximal internal jugular veins are widely patent. The visualized proximal internal jugular veins are patent bilaterally. Straight sinus, vein of Galen, and internal cerebral veins appear patent. IMPRESSION: MRI HEAD IMPRESSION: 1. Severely motion degraded study. 2. No new  acute intracranial process. 3. No significant interval change in FLAIR signal abnormality within the left parietal lobe. Previously seen associated faint nodular enhancement within this region not well seen on this motion degraded study. Faint subcentimeter curvilinear enhancement within the left temporal lobe likely not significantly changed. MRA HEAD IMPRESSION: 1. Severely motion degraded study. 2. No large or proximal arterial branch occlusion. No definite high-grade or correctable stenosis. Distal medium and small vessels are not well evaluated on this exam. MRV HEAD IMPRESSION: 1. Mildly motion degraded study. 2. Irregularity with attenuation of the mid aspect of the superior sagittal sinus, which may reflect sequela of remote venous sinus thrombosis with subsequent re- cannulization. 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 No filling defect or convincing evidence for acute venous sinus thrombosis at this time. 3. Otherwise normal MRV of the brain. Electronically Signed   By: Jeannine Boga M.D.   On: 09/24/2015 23:27   Mr Jeri Cos ZT Contrast  09/24/2015  EXAM: MRI HEAD WITHOUT AND WITH CONTRAST MRA HEAD WITHOUT CONTRAST MRV HEAD WITHOUT CONTRAST TECHNIQUE: Multiplanar, multiecho pulse sequences of the brain and surrounding structures were obtained without and with intravenous contrast. Angiographic images of the intracranial venous structures were obtained using MRV technique without intravenous contrast. CONTRAST:  96m MULTIHANCE GADOBENATE DIMEGLUMINE 529 MG/ML IV SOLN COMPARISON:  Previous MRI has from 09/17/2015, 08/10/2015, and 07/27/2015. FINDINGS: MRI HEAD: Study is moderate to severely degraded by motion artifact. No abnormal foci of restricted diffusion to suggest acute intracranial infarct. No definite foci of susceptibility artifact to suggest hemorrhage.  Faint curvilinear hypodensity overlying the left parietal region on SWI sequence favored to be artifactual, as no definite FLAIR correlate seen in this region (series 1701, image 27). Minimal residual FLAIR signal abnormality within the left parietal lobe, not significantly changed relative to most recent examination from 09/17/2015, but markedly improved relative to earlier studies (series 10, image 22). FLAIR signal abnormality with associated encephalomalacia within the parasagittal posterior right frontal lobe is stable. No new FLAIR abnormality or vasogenic edema on this motion degraded study. Minimal sub cm residual curvilinear enhancement within the left temporal lobe is likely not significantly changed relative to prior (series 20, image 14). Additional faint nodular enhancement within the left parietal lobe not well seen on this motion degraded study. At the least, this cyst early not worsened as compared to prior. No definite new areas of enhancement on this severely motion degraded study. No other mass lesion. No hydrocephalus. No extra-axial fluid collection. Major intracranial vascular flow voids are grossly maintained. Craniocervical junction within normal limits. Visualized upper  cervical spine grossly unremarkable. Pituitary gland normal. No acute abnormality about the orbits. Mild mucosal thickening within the left sphenoid sinus and ethmoidal air cells. Bilateral mastoid effusions noted, larger on the right. Bone marrow signal intensity within normal limits. No acute scalp soft tissue abnormality. MRA HEAD: ANTERIOR CIRCULATION: Study is severely degraded by motion artifact. Visualized distal cervical segments of the internal carotid arteries are patent with antegrade flow. Petrous, cavernous, and supraclinoid segments are widely patent without high-grade stenosis or occlusion. A1 segments patent. Anterior communicating artery grossly normal. Anterior cerebral arteries are markedly attenuated by  motion artifact and not well evaluated, but grossly patent to their distal aspects. M1 segments patent without occlusion or definite stenosis. MCA bifurcations grossly normal. Proximal MCA branches patent. Distal MCA branches not well evaluated on this exam. POSTERIOR CIRCULATION: Vertebral arteries patent to the vertebrobasilar junction. Left vertebral artery dominant. Left posterior inferior cerebral artery patent. Right posterior inferior cerebral artery not visualized. Basilar artery widely patent. Superior cerebellar arteries well opacified. Both posterior cerebral arteries arise from the basilar artery. PCAs are grossly patent to their min aspects, not well evaluated distally. No definite aneurysm. MRV HEAD: Dedicated MRV images demonstrate no filling defect to suggest acute venous sinus thrombosis. There is mild irregularity and attenuation of the mid aspect of the superior sagittal sinus, which may reflect sequela of remote thrombosis with re- cannulization (series 3 1501, image 13). Again, no convincing evidence for acute venous sinus thrombosis at this time. The transverse and sigmoid sinuses are widely patent. Proximal internal jugular veins are widely patent. The visualized proximal internal jugular veins are patent bilaterally. Straight sinus, vein of Galen, and internal cerebral veins appear patent. IMPRESSION: MRI HEAD IMPRESSION: 1. Severely motion degraded study. 2. No new acute intracranial process. 3. No significant interval change in FLAIR signal abnormality within the left parietal lobe. Previously seen associated faint nodular enhancement within this region not well seen on this motion degraded study. Faint subcentimeter curvilinear enhancement within the left temporal lobe likely not significantly changed. MRA HEAD IMPRESSION: 1. Severely motion degraded study. 2. No large or proximal arterial branch occlusion. No definite high-grade or correctable stenosis. Distal medium and small vessels are  not well evaluated on this exam. MRV HEAD IMPRESSION: 1. Mildly motion degraded study. 2. Irregularity with attenuation of the mid aspect of the superior sagittal sinus, which may reflect sequela of remote venous sinus thrombosis with subsequent re- cannulization. 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 No filling defect or convincing evidence for acute venous sinus thrombosis at this time. 3. Otherwise normal MRV of the brain. Electronically Signed   By: Jeannine Boga M.D.   On: 09/24/2015 23:27   Mr Mrv Head Wo Cm  09/24/2015  EXAM: MRI HEAD WITHOUT AND WITH CONTRAST MRA HEAD WITHOUT CONTRAST MRV HEAD WITHOUT CONTRAST TECHNIQUE: Multiplanar, multiecho pulse sequences of the brain and surrounding structures were obtained without and with intravenous contrast. Angiographic images of the intracranial venous structures were obtained using MRV technique without intravenous contrast. CONTRAST:  22m MULTIHANCE GADOBENATE DIMEGLUMINE 529 MG/ML IV SOLN COMPARISON:  Previous MRI has from 09/17/2015, 08/10/2015, and 07/27/2015. FINDINGS: MRI HEAD: Study is moderate to severely degraded by motion artifact. No abnormal foci of restricted diffusion to suggest acute intracranial infarct. No definite foci of susceptibility artifact to suggest hemorrhage. Faint curvilinear hypodensity overlying the left parietal region on SWI sequence favored  to be artifactual, as no definite FLAIR correlate seen in this region (series 1701, image 27). Minimal residual FLAIR signal abnormality within the left parietal lobe, not significantly changed relative to most recent examination from 09/17/2015, but markedly improved relative to earlier studies (series 10, image 22). FLAIR signal abnormality with associated encephalomalacia within the parasagittal posterior right frontal lobe is stable. No new FLAIR  abnormality or vasogenic edema on this motion degraded study. Minimal sub cm residual curvilinear enhancement within the left temporal lobe is likely not significantly changed relative to prior (series 20, image 14). Additional faint nodular enhancement within the left parietal lobe not well seen on this motion degraded study. At the least, this cyst early not worsened as compared to prior. No definite new areas of enhancement on this severely motion degraded study. No other mass lesion. No hydrocephalus. No extra-axial fluid collection. Major intracranial vascular flow voids are grossly maintained. Craniocervical junction within normal limits. Visualized upper cervical spine grossly unremarkable. Pituitary gland normal. No acute abnormality about the orbits. Mild mucosal thickening within the left sphenoid sinus and ethmoidal air cells. Bilateral mastoid effusions noted, larger on the right. Bone marrow signal intensity within normal limits. No acute scalp soft tissue abnormality. MRA HEAD: ANTERIOR CIRCULATION: Study is severely degraded by motion artifact. Visualized distal cervical segments of the internal carotid arteries are patent with antegrade flow. Petrous, cavernous, and supraclinoid segments are widely patent without high-grade stenosis or occlusion. A1 segments patent. Anterior communicating artery grossly normal. Anterior cerebral arteries are markedly attenuated by motion artifact and not well evaluated, but grossly patent to their distal aspects. M1 segments patent without occlusion or definite stenosis. MCA bifurcations grossly normal. Proximal MCA branches patent. Distal MCA branches not well evaluated on this exam. POSTERIOR CIRCULATION: Vertebral arteries patent to the vertebrobasilar junction. Left vertebral artery dominant. Left posterior inferior cerebral artery patent. Right posterior inferior cerebral artery not visualized. Basilar artery widely patent. Superior cerebellar arteries well  opacified. Both posterior cerebral arteries arise from the basilar artery. PCAs are grossly patent to their min aspects, not well evaluated distally. No definite aneurysm. MRV HEAD: Dedicated MRV images demonstrate no filling defect to suggest acute venous sinus thrombosis. There is mild irregularity and attenuation of the mid aspect of the superior sagittal sinus, which may reflect sequela of remote thrombosis with re- cannulization (series 3 1501, image 13). Again, no convincing evidence for acute venous sinus thrombosis at this time. The transverse and sigmoid sinuses are widely patent. Proximal internal jugular veins are widely patent. The visualized proximal internal jugular veins are patent bilaterally. Straight sinus, vein of Galen, and internal cerebral veins appear patent. IMPRESSION: MRI HEAD IMPRESSION: 1. Severely motion degraded study. 2. No new acute intracranial process. 3. No significant interval change in FLAIR signal abnormality within the left parietal lobe. Previously seen associated faint nodular enhancement within this region not well seen on this motion degraded study. Faint subcentimeter curvilinear enhancement within the left temporal lobe likely not significantly changed. MRA HEAD IMPRESSION: 1. Severely motion degraded study. 2. No large or proximal arterial branch occlusion. No definite high-grade or correctable stenosis. Distal medium and small vessels are not well evaluated on this exam. MRV HEAD IMPRESSION: 1. Mildly motion degraded study. 2. Irregularity with attenuation of the mid aspect of the superior sagittal sinus, which may reflect sequela of remote venous sinus thrombosis with subsequent re- cannulization. Encephalomalacia within the parasagittal right frontal lobe at this location raises the possibility that this patient may have had a  remote venous infarct at this location. No filling defect or convincing evidence for acute venous sinus thrombosis at this time. 3. Otherwise  normal MRV of the brain. Electronically Signed   By: Jeannine Boga M.D.   On: 09/24/2015 23:27   I have personally reviewed and evaluated these images and lab results as part of my medical decision-making.   EKG Interpretation   Date/Time:  Tuesday September 25 2015 13:23:02 EST Ventricular Rate:  86 PR Interval:  172 QRS Duration: 90 QT Interval:  410 QTC Calculation: 490 R Axis:   64 Text Interpretation:  Normal sinus rhythm Nonspecific T wave abnormality  Prolonged QT Abnormal ECG No significant change was found Confirmed by  Wyvonnia Dusky  MD, STEPHEN [phone]) on 09/25/2015 1:29:47 PM      MDM   Final diagnoses:  Hypokalemia    Filed Vitals:   09/25/15 1320 09/25/15 1425 09/25/15 1428 09/25/15 1434  BP: 135/73 141/71 144/73 126/92  Pulse: 96 99 97 120  Temp: 98.1 F (36.7 C)     TempSrc: Oral     Resp: 24     SpO2: 95%       Medications  prochlorperazine (COMPAZINE) injection 10 mg (not administered)  magnesium sulfate IVPB 2 g 50 mL (not administered)  aspirin chewable tablet 324 mg (not administered)  sodium chloride 0.9 % bolus 500 mL (500 mLs Intravenous New Bag/Given 09/25/15 1515)    Kristi Kramer is 70 y.o. female presenting for evaluation of confusion. Discussed with her ophthalmologist Dr. Lannette Donath in [place] who has kindly faxed over the note stating that patient is more confused and lethargic than normal. Given this patient's syncope 2 days ago, severe headache of her concern for subdural. Discussed case with neurologist Dr. Lannah Koike Kindred who recommends plain CT. My exam patient is tachycardic, she appears clinically dehydrated. She is afebrile however, she was seen yesterday and had extensive workup and had none of these symptoms are vital sign abnormality. Will panculture her obtain chest x-ray and give fluid boluses. Note from Dr. Karie Fetch requesting hypercoagulability panel, patient was unable to go to the outpatient lab to get these drawn today, we will draw them in  the ED.  She is found to be hypokalemic at 2.6. Magnesium pending. She was given mag and Compazine for her headache.  Case signed out to Dr. Roxanne Mins at shift change: Plan to f/u head CT, UA, lactic acid, CXR and consider admission for dehydration and confusion.   This is a shared visit with the attending physician who personally evaluated the patient and agrees with the care plan.      Monico Blitz, PA-C 09/25/15 [place], MD 09/26/15 [phone]

## 2015-09-25 NOTE — ED Provider Notes (Signed)
Patient initially seen and evaluated by Monico Blitz PA-C, presenting with recurrent dizziness and some question of mental status change. She was noted to have borderline positive orthostatic changes and was given some IV hydration. She is noted to have hypomagnesemia and had magnesium replaced. She was noted to be hypokalemic and was given oral K-Dur. K-Dur is slow release and will not bring her potassium up quickly so she is given intravenous potassium. There is concern for dehydration, but laboratory workup shows no evidence of dehydration. CT of head was obtained which was unremarkable. Drug screen is negative and lactic acid level is normal. I've gone to evaluate the patient and she is awake, alert, oriented with speech normal for her. Will recheck orthostatic vital signs.   Orthostatic vital signs showed dramatic drop in systolic blood pressure with rise in pulse associated with increasing dizziness. This is occurred despite prior IV hydration in the ED. At this point, I do not feel it is safe for her to go home. Case is discussed with Dr. Olevia Bowens of [facility] who agrees to admit the patient.  Results for orders placed or performed during the hospital encounter of 09/25/15  CBC with Differential  Result Value Ref Range   WBC 7.6 4.0 - 10.5 K/uL   RBC 4.06 3.87 - 5.11 MIL/uL   Hemoglobin 12.4 12.0 - 15.0 g/dL   HCT 35.4 (L) 36.0 - 46.0 %   MCV 87.2 78.0 - 100.0 fL   MCH 30.5 26.0 - 34.0 pg   MCHC 35.0 30.0 - 36.0 g/dL   RDW 14.0 11.5 - 15.5 %   Platelets 311 150 - 400 K/uL   Neutrophils Relative % 72 %   Neutro Abs 5.4 1.7 - 7.7 K/uL   Lymphocytes Relative 22 %   Lymphs Abs 1.7 0.7 - 4.0 K/uL   Monocytes Relative 5 %   Monocytes Absolute 0.4 0.1 - 1.0 K/uL   Eosinophils Relative 1 %   Eosinophils Absolute 0.0 0.0 - 0.7 K/uL   Basophils Relative 0 %   Basophils Absolute 0.0 0.0 - 0.1 K/uL  Basic metabolic panel  Result Value Ref Range   Sodium 136 135 - 145 mmol/L    Potassium 2.8 (L) 3.5 - 5.1 mmol/L   Chloride 95 (L) 101 - 111 mmol/L   CO2 29 22 - 32 mmol/L   Glucose, Bld 167 (H) 65 - 99 mg/dL   BUN 14 6 - 20 mg/dL   Creatinine, Ser 0.96 0.44 - 1.00 mg/dL   Calcium 10.1 8.9 - 10.3 mg/dL   GFR calc non Af Amer 60 (L) >60 mL/min   GFR calc Af Amer >60 >60 mL/min   Anion gap 12 5 - 15  Antithrombin III  Result Value Ref Range   AntiThromb III Func 108 75 - 120 %  Urinalysis, Routine w reflex microscopic (not at [facility])  Result Value Ref Range   Color, Urine AMBER (A) YELLOW   APPearance CLOUDY (A) CLEAR   Specific Gravity, Urine 1.023 1.005 - 1.030   pH 5.5 5.0 - 8.0   Glucose, UA NEGATIVE NEGATIVE mg/dL   Hgb urine dipstick NEGATIVE NEGATIVE   Bilirubin Urine SMALL (A) NEGATIVE   Ketones, ur 15 (A) NEGATIVE mg/dL   Protein, ur 30 (A) NEGATIVE mg/dL   Nitrite NEGATIVE NEGATIVE   Leukocytes, UA TRACE (A) NEGATIVE  Magnesium  Result Value Ref Range   Magnesium 1.5 (L) 1.7 - 2.4 mg/dL  Urine rapid drug screen (hosp performed)  Result  Value Ref Range   Opiates NONE DETECTED NONE DETECTED   Cocaine NONE DETECTED NONE DETECTED   Benzodiazepines POSITIVE (A) NONE DETECTED   Amphetamines NONE DETECTED NONE DETECTED   Tetrahydrocannabinol NONE DETECTED NONE DETECTED   Barbiturates NONE DETECTED NONE DETECTED  Urine microscopic-add on  Result Value Ref Range   Squamous Epithelial / LPF 0-5 (A) NONE SEEN   WBC, UA 0-5 0 - 5 WBC/hpf   RBC / HPF 0-5 0 - 5 RBC/hpf   Bacteria, UA FEW (A) NONE SEEN   Casts HYALINE CASTS (A) NEGATIVE   Urine-Other YEAST PRESENT   I-stat troponin, ED  Result Value Ref Range   Troponin i, poc 0.01 0.00 - 0.08 ng/mL   Comment 3          I-Stat CG4 Lactic Acid, ED  Result Value Ref Range   Lactic Acid, Venous 1.37 0.5 - 2.0 mmol/L  I-Stat CG4 Lactic Acid, ED  Result Value Ref Range   Lactic Acid, Venous 0.62 0.5 - 2.0 mmol/L   Ct Head Wo Contrast  09/25/2015  CLINICAL DATA:  Headache 2 months associated with  blurry vision and dizziness. History of falls. EXAM: CT HEAD WITHOUT CONTRAST TECHNIQUE: Contiguous axial images were obtained from the base of the skull through the vertex without intravenous contrast. COMPARISON:  09/24/2015 FINDINGS: There is encephalomalacia in the high right frontal lobe. Global atrophy. Mild chronic ischemic changes in the periventricular white matter. No mass effect, midline shift, or acute hemorrhage. There is fluid in the bilateral mastoid air cells. Mucosal thickening in the left sphenoid sinus. No definite skull fracture. Left-sided ethmoidectomy has been performed. IMPRESSION: Chronic ischemic changes and atrophy. No acute intracranial pathology. There is fluid in the mastoid air cells. Electronically Signed   By: Marybelle Killings M.D.   On: 09/25/2015 16:32   Ct Head Wo Contrast  09/24/2015  CLINICAL DATA:  Blurred vision with left frontal headache for 2 weeks. History of stroke. EXAM: CT HEAD WITHOUT CONTRAST TECHNIQUE: Contiguous axial images were obtained from the base of the skull through the vertex without intravenous contrast. COMPARISON:  CT 07/22/2015.  MRI 08/10/2015 and 09/17/2015. FINDINGS: Brain: There is no evidence of acute intracranial hemorrhage, mass lesion, brain edema or extra-axial fluid collection. The ventricles and subarachnoid spaces are appropriately sized for age. There is no CT evidence of acute cortical infarction. There is stable encephalomalacia medially in the right frontal lobe from a remote infarct. The vasogenic edema noted on the prior CT in the left frontal lobe has resolved, as seen on recent MRIs. Bones/sinuses/visualized face: There are stable postsurgical changes status post left mastoidectomy. A right mastoid effusion is unchanged. Chronic mucosal thickening is present in the left division of the sphenoid sinus. There are no air-fluid levels. The calvarium is intact. IMPRESSION: 1. No acute intracranial findings. 2. Left frontal lobe process  demonstrated on CT 2 months ago demonstrates continued improvement as seen on interval MRIs. Electronically Signed   By: Richardean Sale M.D.   On: 09/24/2015 18:12   Mr Angiogram Head Wo Contrast  09/24/2015  EXAM: MRI HEAD WITHOUT AND WITH CONTRAST MRA HEAD WITHOUT CONTRAST MRV HEAD WITHOUT CONTRAST TECHNIQUE: Multiplanar, multiecho pulse sequences of the brain and surrounding structures were obtained without and with intravenous contrast. Angiographic images of the intracranial venous structures were obtained using MRV technique without intravenous contrast. CONTRAST:  3m MULTIHANCE GADOBENATE DIMEGLUMINE 529 MG/ML IV SOLN COMPARISON:  Previous MRI has from 09/17/2015, 08/10/2015, and 07/27/2015. FINDINGS: MRI  HEAD: Study is moderate to severely degraded by motion artifact. No abnormal foci of restricted diffusion to suggest acute intracranial infarct. No definite foci of susceptibility artifact to suggest hemorrhage. Faint curvilinear hypodensity overlying the left parietal region on SWI sequence favored to be artifactual, as no definite FLAIR correlate seen in this region (series 1701, image 27). Minimal residual FLAIR signal abnormality within the left parietal lobe, not significantly changed relative to most recent examination from 09/17/2015, but markedly improved relative to earlier studies (series 10, image 22). FLAIR signal abnormality with associated encephalomalacia within the parasagittal posterior right frontal lobe is stable. No new FLAIR abnormality or vasogenic edema on this motion degraded study. Minimal sub cm residual curvilinear enhancement within the left temporal lobe is likely not significantly changed relative to prior (series 20, image 14). Additional faint nodular enhancement within the left parietal lobe not well seen on this motion degraded study. At the least, this cyst early not worsened as compared to prior. No definite new areas of enhancement on this severely motion degraded  study. No other mass lesion. No hydrocephalus. No extra-axial fluid collection. Major intracranial vascular flow voids are grossly maintained. Craniocervical junction within normal limits. Visualized upper cervical spine grossly unremarkable. Pituitary gland normal. No acute abnormality about the orbits. Mild mucosal thickening within the left sphenoid sinus and ethmoidal air cells. Bilateral mastoid effusions noted, larger on the right. Bone marrow signal intensity within normal limits. No acute scalp soft tissue abnormality. MRA HEAD: ANTERIOR CIRCULATION: Study is severely degraded by motion artifact. Visualized distal cervical segments of the internal carotid arteries are patent with antegrade flow. Petrous, cavernous, and supraclinoid segments are widely patent without high-grade stenosis or occlusion. A1 segments patent. Anterior communicating artery grossly normal. Anterior cerebral arteries are markedly attenuated by motion artifact and not well evaluated, but grossly patent to their distal aspects. M1 segments patent without occlusion or definite stenosis. MCA bifurcations grossly normal. Proximal MCA branches patent. Distal MCA branches not well evaluated on this exam. POSTERIOR CIRCULATION: Vertebral arteries patent to the vertebrobasilar junction. Left vertebral artery dominant. Left posterior inferior cerebral artery patent. Right posterior inferior cerebral artery not visualized. Basilar artery widely patent. Superior cerebellar arteries well opacified. Both posterior cerebral arteries arise from the basilar artery. PCAs are grossly patent to their min aspects, not well evaluated distally. No definite aneurysm. MRV HEAD: Dedicated MRV images demonstrate no filling defect to suggest acute venous sinus thrombosis. There is mild irregularity and attenuation of the mid aspect of the superior sagittal sinus, which may reflect sequela of remote thrombosis with re- cannulization (series 3 1501, image 13).  Again, no convincing evidence for acute venous sinus thrombosis at this time. The transverse and sigmoid sinuses are widely patent. Proximal internal jugular veins are widely patent. The visualized proximal internal jugular veins are patent bilaterally. Straight sinus, vein of Galen, and internal cerebral veins appear patent. IMPRESSION: MRI HEAD IMPRESSION: 1. Severely motion degraded study. 2. No new acute intracranial process. 3. No significant interval change in FLAIR signal abnormality within the left parietal lobe. Previously seen associated faint nodular enhancement within this region not well seen on this motion degraded study. Faint subcentimeter curvilinear enhancement within the left temporal lobe likely not significantly changed. MRA HEAD IMPRESSION: 1. Severely motion degraded study. 2. No large or proximal arterial branch occlusion. No definite high-grade or correctable stenosis. Distal medium and small vessels are not well evaluated on this exam. MRV HEAD IMPRESSION: 1. Mildly motion degraded study. 2. Irregularity with attenuation  of the mid aspect of the superior sagittal sinus, which may reflect sequela of remote venous sinus thrombosis with subsequent re- cannulization. 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 No filling defect or convincing evidence for acute venous sinus thrombosis at this time. 3. Otherwise normal MRV of the brain. Electronically Signed   By: Jeannine Boga M.D.   On: 09/24/2015 23:27   Mr Jeri Cos PX Contrast  09/24/2015  EXAM: MRI HEAD WITHOUT AND WITH CONTRAST MRA HEAD WITHOUT CONTRAST MRV HEAD WITHOUT CONTRAST TECHNIQUE: Multiplanar, multiecho pulse sequences of the brain and surrounding structures were obtained without and with intravenous contrast. Angiographic images of the intracranial venous structures were obtained using MRV  technique without intravenous contrast. CONTRAST:  51m MULTIHANCE GADOBENATE DIMEGLUMINE 529 MG/ML IV SOLN COMPARISON:  Previous MRI has from 09/17/2015, 08/10/2015, and 07/27/2015. FINDINGS: MRI HEAD: Study is moderate to severely degraded by motion artifact. No abnormal foci of restricted diffusion to suggest acute intracranial infarct. No definite foci of susceptibility artifact to suggest hemorrhage. Faint curvilinear hypodensity overlying the left parietal region on SWI sequence favored to be artifactual, as no definite FLAIR correlate seen in this region (series 1701, image 27). Minimal residual FLAIR signal abnormality within the left parietal lobe, not significantly changed relative to most recent examination from 09/17/2015, but markedly improved relative to earlier studies (series 10, image 22). FLAIR signal abnormality with associated encephalomalacia within the parasagittal posterior right frontal lobe is stable. No new FLAIR abnormality or vasogenic edema on this motion degraded study. Minimal sub cm residual curvilinear enhancement within the left temporal lobe is likely not significantly changed relative to prior (series 20, image 14). Additional faint nodular enhancement within the left parietal lobe not well seen on this motion degraded study. At the least, this cyst early not worsened as compared to prior. No definite new areas of enhancement on this severely motion degraded study. No other mass lesion. No hydrocephalus. No extra-axial fluid collection. Major intracranial vascular flow voids are grossly maintained. Craniocervical junction within normal limits. Visualized upper cervical spine grossly unremarkable. Pituitary gland normal. No acute abnormality about the orbits. Mild mucosal thickening within the left sphenoid sinus and ethmoidal air cells. Bilateral mastoid effusions noted, larger on the right. Bone marrow signal intensity within normal limits. No acute scalp soft tissue abnormality.  MRA HEAD: ANTERIOR CIRCULATION: Study is severely degraded by motion artifact. Visualized distal cervical segments of the internal carotid arteries are patent with antegrade flow. Petrous, cavernous, and supraclinoid segments are widely patent without high-grade stenosis or occlusion. A1 segments patent. Anterior communicating artery grossly normal. Anterior cerebral arteries are markedly attenuated by motion artifact and not well evaluated, but grossly patent to their distal aspects. M1 segments patent without occlusion or definite stenosis. MCA bifurcations grossly normal. Proximal MCA branches patent. Distal MCA branches not well evaluated on this exam. POSTERIOR CIRCULATION: Vertebral arteries patent to the vertebrobasilar junction. Left vertebral artery dominant. Left posterior inferior cerebral artery patent. Right posterior inferior cerebral artery not visualized. Basilar artery widely patent. Superior cerebellar arteries well opacified. Both posterior cerebral arteries arise from the basilar artery. PCAs are grossly patent to their min aspects, not well evaluated distally. No definite aneurysm. MRV HEAD: Dedicated MRV images demonstrate no filling defect to suggest acute venous sinus thrombosis. There is mild irregularity and attenuation of the mid aspect of the superior sagittal sinus, which may reflect sequela  of remote thrombosis with re- cannulization (series 3 1501, image 13). Again, no convincing evidence for acute venous sinus thrombosis at this time. The transverse and sigmoid sinuses are widely patent. Proximal internal jugular veins are widely patent. The visualized proximal internal jugular veins are patent bilaterally. Straight sinus, vein of Galen, and internal cerebral veins appear patent. IMPRESSION: MRI HEAD IMPRESSION: 1. Severely motion degraded study. 2. No new acute intracranial process. 3. No significant interval change in FLAIR signal abnormality within the left parietal lobe.  Previously seen associated faint nodular enhancement within this region not well seen on this motion degraded study. Faint subcentimeter curvilinear enhancement within the left temporal lobe likely not significantly changed. MRA HEAD IMPRESSION: 1. Severely motion degraded study. 2. No large or proximal arterial branch occlusion. No definite high-grade or correctable stenosis. Distal medium and small vessels are not well evaluated on this exam. MRV HEAD IMPRESSION: 1. Mildly motion degraded study. 2. Irregularity with attenuation of the mid aspect of the superior sagittal sinus, which may reflect sequela of remote venous sinus thrombosis with subsequent re- cannulization. 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 No filling defect or convincing evidence for acute venous sinus thrombosis at this time. 3. Otherwise normal MRV of the brain. Electronically Signed   By: Jeannine Boga M.D.   On: 09/24/2015 23:27   Mr Jeri Cos KG Contrast  09/18/2015  CLINICAL DATA:  Known brain tumors, 2 weeks of worsening dizziness and LEFT temporal occipital pain. Three days of blurry vision. History of hypertension, diabetes. EXAM: MRI HEAD WITHOUT AND WITH CONTRAST TECHNIQUE: Multiplanar, multiecho pulse sequences of the brain and surrounding structures were obtained without and with intravenous contrast. CONTRAST:  20 cc MultiHance COMPARISON:  MRI of the brain August 10, 2015 and MRI head July 27, 2015 and MRI head September 27, 2014 FINDINGS: No reduced diffusion to suggest acute ischemia. No susceptibility artifact to suggest hemorrhage. Moderately motion degraded FLAIR sequence, with very mild residual FLAIR hyperintense signal LEFT parietal lobe, markedly improved. Similar FLAIR hyperintense signal mesial RIGHT occipital lobe associated with encephalomalacia. Faint subcentimeter enhancement  LEFT temporal lobe, less conspicuous than prior examination. Minimal faint enhancement LEFT parietal convexity of prior area of nodular enhancement. No new areas of abnormal enhancement though, postcontrast sequences are moderately motion degraded. The ventricles and sulci are normal for patient's age. Patchy supratentorial white matter T2 hyperintensities exclusive of the aforementioned abnormality without midline shift or mass effect. No abnormal extra-axial fluid collections, abnormal enhancement or extra-axial masses. Normal major intracranial vascular flow voids present at the skull base. Ocular globes and orbital contents are nonsuspicious, status post bilateral ocular lens implants. Persistent mastoid effusions. Status post LEFT wall down mastoidectomy. Increasing LEFT sphenoid mucosal thickening without paranasal sinus air-fluid levels. No abnormal sellar expansion. No cerebellar tonsillar ectopia. No suspicious calvarial bone marrow signal. IMPRESSION: No acute intracranial process. Continued resolution of subcentimeter enhancing nodularity LEFT temporal parietal lobes, without new lesions on moderately motion degraded post contrast sequences. Mesial RIGHT posterior frontal lobe infarct. Electronically Signed   By: Elon Alas M.D.   On: 09/18/2015 00:21   Dg Chest Port 1 View  09/25/2015  CLINICAL DATA:  Confusion, tachycardia, dizziness EXAM: PORTABLE CHEST 1 VIEW COMPARISON:  CT chest 07/31/2015 FINDINGS: The heart size and mediastinal contours are within normal limits. Both lungs are clear. The visualized skeletal structures are unremarkable. IMPRESSION: No active  disease. Electronically Signed   By: Kathreen Devoid   On: 09/25/2015 16:13   Mr Mrv Head Wo Cm  09/24/2015  EXAM: MRI HEAD WITHOUT AND WITH CONTRAST MRA HEAD WITHOUT CONTRAST MRV HEAD WITHOUT CONTRAST TECHNIQUE: Multiplanar, multiecho pulse sequences of the brain and surrounding structures were obtained without and with intravenous  contrast. Angiographic images of the intracranial venous structures were obtained using MRV technique without intravenous contrast. CONTRAST:  27m MULTIHANCE GADOBENATE DIMEGLUMINE 529 MG/ML IV SOLN COMPARISON:  Previous MRI has from 09/17/2015, 08/10/2015, and 07/27/2015. FINDINGS: MRI HEAD: Study is moderate to severely degraded by motion artifact. No abnormal foci of restricted diffusion to suggest acute intracranial infarct. No definite foci of susceptibility artifact to suggest hemorrhage. Faint curvilinear hypodensity overlying the left parietal region on SWI sequence favored to be artifactual, as no definite FLAIR correlate seen in this region (series 1701, image 27). Minimal residual FLAIR signal abnormality within the left parietal lobe, not significantly changed relative to most recent examination from 09/17/2015, but markedly improved relative to earlier studies (series 10, image 22). FLAIR signal abnormality with associated encephalomalacia within the parasagittal posterior right frontal lobe is stable. No new FLAIR abnormality or vasogenic edema on this motion degraded study. Minimal sub cm residual curvilinear enhancement within the left temporal lobe is likely not significantly changed relative to prior (series 20, image 14). Additional faint nodular enhancement within the left parietal lobe not well seen on this motion degraded study. At the least, this cyst early not worsened as compared to prior. No definite new areas of enhancement on this severely motion degraded study. No other mass lesion. No hydrocephalus. No extra-axial fluid collection. Major intracranial vascular flow voids are grossly maintained. Craniocervical junction within normal limits. Visualized upper cervical spine grossly unremarkable. Pituitary gland normal. No acute abnormality about the orbits. Mild mucosal thickening within the left sphenoid sinus and ethmoidal air cells. Bilateral mastoid effusions noted, larger on the  right. Bone marrow signal intensity within normal limits. No acute scalp soft tissue abnormality. MRA HEAD: ANTERIOR CIRCULATION: Study is severely degraded by motion artifact. Visualized distal cervical segments of the internal carotid arteries are patent with antegrade flow. Petrous, cavernous, and supraclinoid segments are widely patent without high-grade stenosis or occlusion. A1 segments patent. Anterior communicating artery grossly normal. Anterior cerebral arteries are markedly attenuated by motion artifact and not well evaluated, but grossly patent to their distal aspects. M1 segments patent without occlusion or definite stenosis. MCA bifurcations grossly normal. Proximal MCA branches patent. Distal MCA branches not well evaluated on this exam. POSTERIOR CIRCULATION: Vertebral arteries patent to the vertebrobasilar junction. Left vertebral artery dominant. Left posterior inferior cerebral artery patent. Right posterior inferior cerebral artery not visualized. Basilar artery widely patent. Superior cerebellar arteries well opacified. Both posterior cerebral arteries arise from the basilar artery. PCAs are grossly patent to their min aspects, not well evaluated distally. No definite aneurysm. MRV HEAD: Dedicated MRV images demonstrate no filling defect to suggest acute venous sinus thrombosis. There is mild irregularity and attenuation of the mid aspect of the superior sagittal sinus, which may reflect sequela of remote thrombosis with re- cannulization (series 3 1501, image 13). Again, no convincing evidence for acute venous sinus thrombosis at this time. The transverse and sigmoid sinuses are widely patent. Proximal internal jugular veins are widely patent. The visualized proximal internal jugular veins are patent bilaterally. Straight sinus, vein of Galen, and internal cerebral veins appear patent. IMPRESSION: MRI HEAD IMPRESSION: 1. Severely motion degraded study. 2. No  new acute intracranial process. 3.  No significant interval change in FLAIR signal abnormality within the left parietal lobe. Previously seen associated faint nodular enhancement within this region not well seen on this motion degraded study. Faint subcentimeter curvilinear enhancement within the left temporal lobe likely not significantly changed. MRA HEAD IMPRESSION: 1. Severely motion degraded study. 2. No large or proximal arterial branch occlusion. No definite high-grade or correctable stenosis. Distal medium and small vessels are not well evaluated on this exam. MRV HEAD IMPRESSION: 1. Mildly motion degraded study. 2. Irregularity with attenuation of the mid aspect of the superior sagittal sinus, which may reflect sequela of remote venous sinus thrombosis with subsequent re- cannulization. 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 No filling defect or convincing evidence for acute venous sinus thrombosis at this time. 3. Otherwise normal MRV of the brain. Electronically Signed   By: Jeannine Boga M.D.   On: 09/24/2015 64:40      Delora Fuel, MD 34/74/25 [id number]

## 2015-09-25 NOTE — ED Notes (Signed)
Pt's family contact information: #1 Gracelyn Nurse (significant other) [phone]; #2 Ursula Alert (daughter) [phone].   Pt has stated a password so family can receive updates over the phone is "[place]"

## 2015-09-25 NOTE — Telephone Encounter (Signed)
I spoke with Mr. Pack to see how Mrs. Verdone was doing.  She went to the ED last night for dysmetria.  MRI of brain showed no acute infarcts.  MRV did show irregularity in the superior sagittal sinus, suggestive of prior venous thrombosis, which has since dissipated.  The sinus is now patent.  It correlates the signal findings in the brain.  At this point we will check a hypercoagulable/thrombophilia workup, including  Homozygous prothrombin G20210A, factor V leiden gene mutation, protein C and S, antithrombin, antiphosholipid)

## 2015-09-25 NOTE — Telephone Encounter (Signed)
I faxed the order for the hypercoag panel to her PCP's office via fax.

## 2015-09-25 NOTE — ED Notes (Signed)
Dr Ortiz at bedside 

## 2015-09-25 NOTE — ED Notes (Signed)
Pt arrived via EMS. EMS report that the pt was being seen at the eye dr. For ongoing treatment of blurred vision. The eye dr noted that the pt was anxious and had dizziness upon standing which is new for the pt. EMS states that the pt was evaluated here on 3/6 for that fall and was discharged home.

## 2015-09-26 ENCOUNTER — Telehealth: Payer: Self-pay

## 2015-09-26 DIAGNOSIS — D869 Sarcoidosis, unspecified: Secondary | ICD-10-CM | POA: Diagnosis present

## 2015-09-26 DIAGNOSIS — I951 Orthostatic hypotension: Principal | ICD-10-CM

## 2015-09-26 DIAGNOSIS — E876 Hypokalemia: Secondary | ICD-10-CM

## 2015-09-26 DIAGNOSIS — R000 Tachycardia, unspecified: Secondary | ICD-10-CM | POA: Diagnosis present

## 2015-09-26 DIAGNOSIS — R4182 Altered mental status, unspecified: Secondary | ICD-10-CM | POA: Diagnosis not present

## 2015-09-26 DIAGNOSIS — Z7982 Long term (current) use of aspirin: Secondary | ICD-10-CM | POA: Diagnosis not present

## 2015-09-26 DIAGNOSIS — F17210 Nicotine dependence, cigarettes, uncomplicated: Secondary | ICD-10-CM | POA: Diagnosis present

## 2015-09-26 DIAGNOSIS — G4733 Obstructive sleep apnea (adult) (pediatric): Secondary | ICD-10-CM | POA: Diagnosis present

## 2015-09-26 DIAGNOSIS — D496 Neoplasm of unspecified behavior of brain: Secondary | ICD-10-CM | POA: Diagnosis present

## 2015-09-26 DIAGNOSIS — R0609 Other forms of dyspnea: Secondary | ICD-10-CM | POA: Diagnosis not present

## 2015-09-26 DIAGNOSIS — N390 Urinary tract infection, site not specified: Secondary | ICD-10-CM | POA: Diagnosis not present

## 2015-09-26 DIAGNOSIS — E119 Type 2 diabetes mellitus without complications: Secondary | ICD-10-CM

## 2015-09-26 DIAGNOSIS — Z8673 Personal history of transient ischemic attack (TIA), and cerebral infarction without residual deficits: Secondary | ICD-10-CM | POA: Diagnosis not present

## 2015-09-26 DIAGNOSIS — B952 Enterococcus as the cause of diseases classified elsewhere: Secondary | ICD-10-CM | POA: Diagnosis not present

## 2015-09-26 DIAGNOSIS — K219 Gastro-esophageal reflux disease without esophagitis: Secondary | ICD-10-CM | POA: Diagnosis present

## 2015-09-26 DIAGNOSIS — I10 Essential (primary) hypertension: Secondary | ICD-10-CM | POA: Diagnosis not present

## 2015-09-26 DIAGNOSIS — F419 Anxiety disorder, unspecified: Secondary | ICD-10-CM | POA: Diagnosis present

## 2015-09-26 DIAGNOSIS — J449 Chronic obstructive pulmonary disease, unspecified: Secondary | ICD-10-CM | POA: Diagnosis present

## 2015-09-26 DIAGNOSIS — E785 Hyperlipidemia, unspecified: Secondary | ICD-10-CM | POA: Diagnosis present

## 2015-09-26 DIAGNOSIS — I639 Cerebral infarction, unspecified: Secondary | ICD-10-CM | POA: Diagnosis not present

## 2015-09-26 LAB — CBC
HEMATOCRIT: 34 % — AB (ref 36.0–46.0)
HEMOGLOBIN: 12 g/dL (ref 12.0–15.0)
MCH: 31.4 pg (ref 26.0–34.0)
MCHC: 35.3 g/dL (ref 30.0–36.0)
MCV: 89 fL (ref 78.0–100.0)
Platelets: 280 10*3/uL (ref 150–400)
RBC: 3.82 MIL/uL — AB (ref 3.87–5.11)
RDW: 13.9 % (ref 11.5–15.5)
WBC: 6.2 10*3/uL (ref 4.0–10.5)

## 2015-09-26 LAB — TROPONIN I

## 2015-09-26 LAB — LUPUS ANTICOAGULANT PANEL
DRVVT: 37.4 s (ref 0.0–44.0)
PTT Lupus Anticoagulant: 30.7 s (ref 0.0–43.6)

## 2015-09-26 LAB — CBG MONITORING, ED
GLUCOSE-CAPILLARY: 170 mg/dL — AB (ref 65–99)
GLUCOSE-CAPILLARY: 212 mg/dL — AB (ref 65–99)

## 2015-09-26 LAB — BASIC METABOLIC PANEL
ANION GAP: 12 (ref 5–15)
BUN: 12 mg/dL (ref 6–20)
CHLORIDE: 103 mmol/L (ref 101–111)
CO2: 25 mmol/L (ref 22–32)
Calcium: 9.2 mg/dL (ref 8.9–10.3)
Creatinine, Ser: 0.8 mg/dL (ref 0.44–1.00)
GFR calc Af Amer: >60 mL/min (ref >60)
GFR calc non Af Amer: >60 mL/min (ref >60)
GLUCOSE: 175 mg/dL — AB (ref 65–99)
POTASSIUM: 3.7 mmol/L (ref 3.5–5.1)
Sodium: 140 mmol/L (ref 135–145)

## 2015-09-26 LAB — CARDIOLIPIN ANTIBODIES, IGG, IGM, IGA

## 2015-09-26 LAB — PROTEIN C ACTIVITY: Protein C Activity: 136 % (ref 73–180)

## 2015-09-26 LAB — PROTEIN S ACTIVITY: Protein S Activity: 155 % — ABNORMAL HIGH (ref 63–140)

## 2015-09-26 LAB — GLUCOSE, CAPILLARY
GLUCOSE-CAPILLARY: 179 mg/dL — AB (ref 65–99)
Glucose-Capillary: 203 mg/dL — ABNORMAL HIGH (ref 65–99)

## 2015-09-26 LAB — CREATININE, SERUM
Creatinine, Ser: 0.82 mg/dL (ref 0.44–1.00)
GFR calc Af Amer: >60 mL/min (ref >60)

## 2015-09-26 LAB — PROTEIN S, TOTAL: Protein S Ag, Total: 149 % (ref 60–150)

## 2015-09-26 LAB — HOMOCYSTEINE: HOMOCYSTEINE-NORM: 19.9 umol/L — AB (ref 0.0–15.0)

## 2015-09-26 LAB — ANTITHROMBIN III: AntiThromb III Func: 108 % (ref 75–120)

## 2015-09-26 LAB — PHOSPHORUS
PHOSPHORUS: 2.4 mg/dL — AB (ref 2.5–4.6)
PHOSPHORUS: 2.7 mg/dL (ref 2.5–4.6)

## 2015-09-26 MED ORDER — ASPIRIN EC 81 MG PO TBEC
81.0000 mg | DELAYED_RELEASE_TABLET | Freq: Every day | ORAL | Status: DC
  Administered 2015-09-26 – 2015-09-28 (×3): 81 mg via ORAL
  Filled 2015-09-26 (×3): qty 1

## 2015-09-26 MED ORDER — ALPRAZOLAM 0.5 MG PO TABS
0.5000 mg | ORAL_TABLET | Freq: Three times a day (TID) | ORAL | Status: DC | PRN
  Administered 2015-09-26 – 2015-09-27 (×2): 0.5 mg via ORAL
  Filled 2015-09-26 (×3): qty 1

## 2015-09-26 MED ORDER — LORATADINE 10 MG PO TABS: 10.0000 mg | ORAL_TABLET | Freq: Every day | ORAL | Status: DC | PRN

## 2015-09-26 MED ORDER — SODIUM CHLORIDE 0.9 % IV SOLN
INTRAVENOUS | Status: DC
  Administered 2015-09-26: 11:00:00 via INTRAVENOUS
  Filled 2015-09-26: qty 1000

## 2015-09-26 MED ORDER — ALBUTEROL SULFATE (2.5 MG/3ML) 0.083% IN NEBU: 2.5000 mg | INHALATION_SOLUTION | Freq: Four times a day (QID) | RESPIRATORY_TRACT | Status: DC | PRN

## 2015-09-26 MED ORDER — METFORMIN HCL 500 MG PO TABS
1000.0000 mg | ORAL_TABLET | Freq: Two times a day (BID) | ORAL | Status: DC
  Filled 2015-09-26: qty 2

## 2015-09-26 MED ORDER — CITALOPRAM HYDROBROMIDE 20 MG PO TABS
20.0000 mg | ORAL_TABLET | Freq: Every day | ORAL | Status: DC
  Administered 2015-09-26 – 2015-09-28 (×3): 20 mg via ORAL
  Filled 2015-09-26: qty 1
  Filled 2015-09-26: qty 2
  Filled 2015-09-26: qty 1

## 2015-09-26 MED ORDER — SODIUM CHLORIDE 0.9% FLUSH
3.0000 mL | Freq: Two times a day (BID) | INTRAVENOUS | Status: DC
  Administered 2015-09-27 – 2015-09-28 (×2): 3 mL via INTRAVENOUS

## 2015-09-26 MED ORDER — INSULIN ASPART 100 UNIT/ML ~~LOC~~ SOLN
0.0000 [IU] | Freq: Three times a day (TID) | SUBCUTANEOUS | Status: DC
  Administered 2015-09-26: 7 [IU] via SUBCUTANEOUS
  Administered 2015-09-26 – 2015-09-27 (×2): 4 [IU] via SUBCUTANEOUS
  Administered 2015-09-27: 3 [IU] via SUBCUTANEOUS
  Administered 2015-09-27: 4 [IU] via SUBCUTANEOUS
  Filled 2015-09-26: qty 1

## 2015-09-26 MED ORDER — PIOGLITAZONE HCL 30 MG PO TABS
30.0000 mg | ORAL_TABLET | Freq: Every day | ORAL | Status: DC
  Administered 2015-09-26: 30 mg via ORAL
  Filled 2015-09-26 (×2): qty 1

## 2015-09-26 MED ORDER — ENOXAPARIN SODIUM 40 MG/0.4ML ~~LOC~~ SOLN
40.0000 mg | Freq: Every day | SUBCUTANEOUS | Status: DC
  Administered 2015-09-26 – 2015-09-28 (×3): 40 mg via SUBCUTANEOUS
  Filled 2015-09-26 (×4): qty 0.4

## 2015-09-26 MED ORDER — GLIPIZIDE ER 10 MG PO TB24
10.0000 mg | ORAL_TABLET | Freq: Two times a day (BID) | ORAL | Status: DC
  Filled 2015-09-26: qty 1

## 2015-09-26 MED ORDER — LINAGLIPTIN 5 MG PO TABS
5.0000 mg | ORAL_TABLET | Freq: Every day | ORAL | Status: DC
  Administered 2015-09-26 – 2015-09-28 (×3): 5 mg via ORAL
  Filled 2015-09-26 (×4): qty 1

## 2015-09-26 MED ORDER — ATORVASTATIN CALCIUM 40 MG PO TABS
40.0000 mg | ORAL_TABLET | Freq: Every day | ORAL | Status: DC
  Administered 2015-09-26 – 2015-09-28 (×3): 40 mg via ORAL
  Filled 2015-09-26: qty 4
  Filled 2015-09-26 (×2): qty 1

## 2015-09-26 NOTE — ED Notes (Signed)
Patient CBG was 170, The Nurse was informed.

## 2015-09-26 NOTE — Evaluation (Signed)
Physical Therapy Evaluation Patient Details Name: Kristi Kramer MRN: [id number] DOB: 1945/12/03 Today's Date: 09/26/2015   History of Present Illness  Kristi Kramer is a 70 y.o. 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  Clinical Impression  Pt emotional, impulsive and shaky. Pt requiring minA for safe ambulation and transfers due to decreased safety awareness. Pt safe to go home with assist of significant other provided he can provide 24/7 assist and she uses a RW for amb.    Follow Up Recommendations Home health PT;Supervision/Assistance - 24 hour    Equipment Recommendations  None recommended by PT (has RW)    Recommendations for Other Services       Precautions / Restrictions Precautions Precautions: Fall      Mobility  Bed Mobility Overal bed mobility: Modified Independent             General bed mobility comments: hob elevated   Transfers Overall transfer level: Needs assistance Equipment used: Rolling walker (2 wheeled) Transfers: Sit to/from Stand Sit to Stand: Min guard         General transfer comment: v/c's for safe hand placement, v/c's to slow down, pt impulsive  Ambulation/Gait Ambulation/Gait assistance: Min assist Ambulation Distance (Feet): 50 Feet (x2) Assistive device: Rolling walker (2 wheeled) Gait Pattern/deviations: Step-through pattern;Wide base of support   Gait velocity interpretation:  (v/c's to slow down) General Gait Details: impulsive, v/c's for safe walker management, mild dizziness. pt shaky and slightly anxious  Stairs            Wheelchair Mobility    Modified Rankin (Stroke Patients Only)       Balance  Overall balance assessment: Needs assistance Sitting-balance support: Feet supported;No upper extremity supported Sitting balance-Leahy Scale: Fair     Standing balance support: Bilateral upper extremity supported Standing balance-Leahy Scale: Poor Standing balance comment: pt okay statically but needs RW for safe ambulation as she reaches for objects to hold onto                             Pertinent Vitals/Pain Pain Assessment: No/denies pain    Home Living Family/patient expects to be discharged to:: Private residence Living Arrangements: Spouse/significant other Available Help at Discharge: Family;Available 24 hours/day Type of Home: House Home Access: Stairs to enter Entrance Stairs-Rails: Can reach both Entrance Stairs-Number of Steps: 2 Home Layout: One level Home Equipment: Walker - 2 wheels;Cane - single point      Prior Function Level of Independence: Needs assistance   Gait / Transfers Assistance Needed: uses cane  ADL's / Homemaking Assistance Needed: signifiant other assist with transfers in/out of tub        Hand Dominance   Dominant Hand: Right    Extremity/Trunk Assessment   Upper Extremity Assessment: Generalized weakness           Lower Extremity Assessment: Generalized weakness      Cervical / Trunk Assessment: Normal  Communication   Communication: No difficulties  Cognition Arousal/Alertness: Awake/alert Behavior During Therapy: Impulsive Overall Cognitive Status:  (emotionally labile, anxious)                      General  Comments General comments (skin integrity, edema, etc.): pt emotional regarding "my children won't have anything to do with me" "they'll just through me to the dogs".  pt assisted to bathroom, minA for safety for transfer on/off commode, indep for hygiene    Exercises        Assessment/Plan    PT Assessment Patient needs continued PT services  PT Diagnosis Difficulty walking;Generalized  weakness   PT Problem List Decreased strength;Decreased range of motion;Decreased balance;Decreased mobility;Decreased coordination  PT Treatment Interventions DME instruction;Gait training;Stair training;Functional mobility training;Therapeutic activities;Therapeutic exercise;Balance training   PT Goals (Current goals can be found in the Care Plan section) Acute Rehab PT Goals Patient Stated Goal: home PT Goal Formulation: With patient Time For Goal Achievement: 10/03/15 Potential to Achieve Goals: Good    Frequency Min 3X/week   Barriers to discharge        Co-evaluation               End of Session Equipment Utilized During Treatment: Gait belt          Functional Assessment Tool Used: clinical judgement Functional Limitation: Mobility: Walking and moving around Mobility: Walking and Moving Around Current Status [phone]): At least 1 percent but less than 20 percent impaired, limited or restricted Mobility: Walking and Moving Around Goal Status [phone]): At least 1 percent but less than 20 percent impaired, limited or restricted    Time: 2952-8413 PT Time Calculation (min) (ACUTE ONLY): 23 min   Charges:   PT Evaluation $PT Eval Moderate Complexity: 1 Procedure PT Treatments $Gait Training: 8-22 mins   PT G Codes:   PT G-Codes **NOT FOR INPATIENT CLASS** Functional Assessment Tool Used: clinical judgement Functional Limitation: Mobility: Walking and moving around Mobility: Walking and Moving Around Current Status ([id number]): At least 1 percent but less than 20 percent impaired, limited or restricted Mobility: Walking and Moving Around Goal Status [phone]): At least 1 percent but less than 20 percent impaired, limited or restricted    Kingsley Callander 09/26/2015, 5:01 PM   Kittie Plater, PT, DPT Pager #: [phone] Office #: [phone]

## 2015-09-26 NOTE — Progress Notes (Signed)
[facility] PROGRESS NOTE  Kristi Kramer KXF:[id number] DOB: [birth year] DOA: 09/25/2015 PCP: Fae Pippin  Assessment/Plan: #1 orthostatic hypotension Patient had presented with visual changes and dizziness and noted to be severely orthostatic. Antihypertensive medications have been held. Clinical improvement. Continue IV fluids. Follow. Repeat orthostatics in the morning.  #2 brain tumor Admitting physician discussed case with neuro hospitalist on call who recommended resuming patient back on glucocorticoids and PT OT evaluation. Patient has been started on Decadron. PT OT pending. On need to follow-up with his neurologist as outpatient.  #3 type 2 diabetes mellitus Continue sliding scale insulin while on steroids. Discontinue Actos. Continue charge enter. Follow.  #4 hypertension Antihypertensive medications on hold secondary to problem #1. Follow.  #5 hypokalemia Repleted.  #6 obstructive sleep apnea C past daily at bedtime.  #7 prophylaxis PPI for GI prophylaxis. Lovenox for DVT prophylaxis.  Code Status: Full Family Communication: Updated patient. No family present. Disposition Plan: Home once orthostatic hypotension has resolved with improvement in visual changes.   Consultants:  None  Procedures:  Chest x-ray 09/25/2015  CT head 09/25/2015    Antibiotics:  None  HPI/Subjective: Patient states dizziness has improved. Patient denies any chest pain. No shortness of breath. Patient seen ambulating from bathroom to bed without any problems.  Objective: Filed Vitals:   09/26/15 1200 09/26/15 1300  BP: 101/73 122/94  Pulse: 84 97  Temp:    Resp: 21 29    Intake/Output Summary (Last 24 hours) at 09/26/15 1441 Last data filed at 09/26/15 1248  Gross per 24 hour  Intake     50 ml  Output    325 ml  Net   -275 ml   There were no vitals filed for this visit.  Exam:   General:  NAD  Cardiovascular: RRR  Respiratory: CTAB  Abdomen:  Soft, nontender, nondistended, positive bowel sounds  Musculoskeletal: No clubbing cyanosis or edema.  Data Reviewed: Basic Metabolic Panel:  Recent Labs Lab 09/24/15 1706 09/25/15 1354 09/25/15 1550 09/26/15 0346 09/26/15 0647 09/26/15 0653  NA 138 136  --  140  --   --   K 3.2* 2.8*  --  3.7  --   --   CL 97* 95*  --  103  --   --   CO2 27 29  --  25  --   --   GLUCOSE 115* 167*  --  175*  --   --   BUN 12 14  --  12  --   --   CREATININE 0.88 0.96  --  0.80  --  0.82  CALCIUM 10.4* 10.1  --  9.2  --   --   MG  --   --  1.5*  --   --   --   PHOS  --   --   --  2.4* 2.7  --    Liver Function Tests: No results for input(s): AST, ALT, ALKPHOS, BILITOT, PROT, ALBUMIN in the last 168 hours. No results for input(s): LIPASE, AMYLASE in the last 168 hours. No results for input(s): AMMONIA in the last 168 hours. CBC:  Recent Labs Lab 09/24/15 1706 09/25/15 1354 09/26/15 0653  WBC 7.3 7.6 6.2  NEUTROABS  --  5.4  --   HGB 12.7 12.4 12.0  HCT 36.8 35.4* 34.0*  MCV 87.8 87.2 89.0  PLT 287 311 280   Cardiac Enzymes:  Recent Labs Lab 09/26/15 0346 09/26/15 0647  TROPONINI <0.03 <0.03   BNP (last  3 results) No results for input(s): BNP in the last 8760 hours.  ProBNP (last 3 results) No results for input(s): PROBNP in the last 8760 hours.  CBG:  Recent Labs Lab 09/26/15 0750 09/26/15 1224  GLUCAP 170* 212*    Recent Results (from the past 240 hour(s))  Urine culture     Status: None (Preliminary result)   Collection Time: 09/25/15  5:29 PM  Result Value Ref Range Status   Specimen Description URINE, CATHETERIZED  Final   Special Requests NONE  Final   Culture CULTURE REINCUBATED FOR BETTER GROWTH  Final   Report Status PENDING  Incomplete     Studies: Ct Head Wo Contrast  09/25/2015  CLINICAL DATA:  Headache 2 months associated with blurry vision and dizziness. History of falls. EXAM: CT HEAD WITHOUT CONTRAST TECHNIQUE: Contiguous axial images were  obtained from the base of the skull through the vertex without intravenous contrast. COMPARISON:  09/24/2015 FINDINGS: There is encephalomalacia in the high right frontal lobe. Global atrophy. Mild chronic ischemic changes in the periventricular white matter. No mass effect, midline shift, or acute hemorrhage. There is fluid in the bilateral mastoid air cells. Mucosal thickening in the left sphenoid sinus. No definite skull fracture. Left-sided ethmoidectomy has been performed. IMPRESSION: Chronic ischemic changes and atrophy. No acute intracranial pathology. There is fluid in the mastoid air cells. Electronically Signed   By: Marybelle Killings M.D.   On: 09/25/2015 16:32   Ct Head Wo Contrast  09/24/2015  CLINICAL DATA:  Blurred vision with left frontal headache for 2 weeks. History of stroke. EXAM: CT HEAD WITHOUT CONTRAST TECHNIQUE: Contiguous axial images were obtained from the base of the skull through the vertex without intravenous contrast. COMPARISON:  CT 07/22/2015.  MRI 08/10/2015 and 09/17/2015. FINDINGS: Brain: There is no evidence of acute intracranial hemorrhage, mass lesion, brain edema or extra-axial fluid collection. The ventricles and subarachnoid spaces are appropriately sized for age. There is no CT evidence of acute cortical infarction. There is stable encephalomalacia medially in the right frontal lobe from a remote infarct. The vasogenic edema noted on the prior CT in the left frontal lobe has resolved, as seen on recent MRIs. Bones/sinuses/visualized face: There are stable postsurgical changes status post left mastoidectomy. A right mastoid effusion is unchanged. Chronic mucosal thickening is present in the left division of the sphenoid sinus. There are no air-fluid levels. The calvarium is intact. IMPRESSION: 1. No acute intracranial findings. 2. Left frontal lobe process demonstrated on CT 2 months ago demonstrates continued improvement as seen on interval MRIs. Electronically Signed   By:  Richardean Sale M.D.   On: 09/24/2015 18:12   Mr Angiogram Head Wo Contrast  09/24/2015  EXAM: MRI HEAD WITHOUT AND WITH CONTRAST MRA HEAD WITHOUT CONTRAST MRV HEAD WITHOUT CONTRAST TECHNIQUE: Multiplanar, multiecho pulse sequences of the brain and surrounding structures were obtained without and with intravenous contrast. Angiographic images of the intracranial venous structures were obtained using MRV technique without intravenous contrast. CONTRAST:  33m MULTIHANCE GADOBENATE DIMEGLUMINE 529 MG/ML IV SOLN COMPARISON:  Previous MRI has from 09/17/2015, 08/10/2015, and 07/27/2015. FINDINGS: MRI HEAD: Study is moderate to severely degraded by motion artifact. No abnormal foci of restricted diffusion to suggest acute intracranial infarct. No definite foci of susceptibility artifact to suggest hemorrhage. Faint curvilinear hypodensity overlying the left parietal region on SWI sequence favored to be artifactual, as no definite FLAIR correlate seen in this region (series 1701, image 27). Minimal residual FLAIR signal abnormality within the left  parietal lobe, not significantly changed relative to most recent examination from 09/17/2015, but markedly improved relative to earlier studies (series 10, image 22). FLAIR signal abnormality with associated encephalomalacia within the parasagittal posterior right frontal lobe is stable. No new FLAIR abnormality or vasogenic edema on this motion degraded study. Minimal sub cm residual curvilinear enhancement within the left temporal lobe is likely not significantly changed relative to prior (series 20, image 14). Additional faint nodular enhancement within the left parietal lobe not well seen on this motion degraded study. At the least, this cyst early not worsened as compared to prior. No definite new areas of enhancement on this severely motion degraded study. No other mass lesion. No hydrocephalus. No extra-axial fluid collection. Major intracranial vascular flow voids are  grossly maintained. Craniocervical junction within normal limits. Visualized upper cervical spine grossly unremarkable. Pituitary gland normal. No acute abnormality about the orbits. Mild mucosal thickening within the left sphenoid sinus and ethmoidal air cells. Bilateral mastoid effusions noted, larger on the right. Bone marrow signal intensity within normal limits. No acute scalp soft tissue abnormality. MRA HEAD: ANTERIOR CIRCULATION: Study is severely degraded by motion artifact. Visualized distal cervical segments of the internal carotid arteries are patent with antegrade flow. Petrous, cavernous, and supraclinoid segments are widely patent without high-grade stenosis or occlusion. A1 segments patent. Anterior communicating artery grossly normal. Anterior cerebral arteries are markedly attenuated by motion artifact and not well evaluated, but grossly patent to their distal aspects. M1 segments patent without occlusion or definite stenosis. MCA bifurcations grossly normal. Proximal MCA branches patent. Distal MCA branches not well evaluated on this exam. POSTERIOR CIRCULATION: Vertebral arteries patent to the vertebrobasilar junction. Left vertebral artery dominant. Left posterior inferior cerebral artery patent. Right posterior inferior cerebral artery not visualized. Basilar artery widely patent. Superior cerebellar arteries well opacified. Both posterior cerebral arteries arise from the basilar artery. PCAs are grossly patent to their min aspects, not well evaluated distally. No definite aneurysm. MRV HEAD: Dedicated MRV images demonstrate no filling defect to suggest acute venous sinus thrombosis. There is mild irregularity and attenuation of the mid aspect of the superior sagittal sinus, which may reflect sequela of remote thrombosis with re- cannulization (series 3 1501, image 13). Again, no convincing evidence for acute venous sinus thrombosis at this time. The transverse and sigmoid sinuses are widely  patent. Proximal internal jugular veins are widely patent. The visualized proximal internal jugular veins are patent bilaterally. Straight sinus, vein of Galen, and internal cerebral veins appear patent. IMPRESSION: MRI HEAD IMPRESSION: 1. Severely motion degraded study. 2. No new acute intracranial process. 3. No significant interval change in FLAIR signal abnormality within the left parietal lobe. Previously seen associated faint nodular enhancement within this region not well seen on this motion degraded study. Faint subcentimeter curvilinear enhancement within the left temporal lobe likely not significantly changed. MRA HEAD IMPRESSION: 1. Severely motion degraded study. 2. No large or proximal arterial branch occlusion. No definite high-grade or correctable stenosis. Distal medium and small vessels are not well evaluated on this exam. MRV HEAD IMPRESSION: 1. Mildly motion degraded study. 2. Irregularity with attenuation of the mid aspect of the superior sagittal sinus, which may reflect sequela of remote venous sinus thrombosis with subsequent re- cannulization. 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 No filling defect or convincing evidence for acute venous sinus thrombosis at this time. 3. Otherwise normal MRV  of the brain. Electronically Signed   By: Jeannine Boga M.D.   On: 09/24/2015 23:27   Mr Jeri Cos XH Contrast  09/24/2015  EXAM: MRI HEAD WITHOUT AND WITH CONTRAST MRA HEAD WITHOUT CONTRAST MRV HEAD WITHOUT CONTRAST TECHNIQUE: Multiplanar, multiecho pulse sequences of the brain and surrounding structures were obtained without and with intravenous contrast. Angiographic images of the intracranial venous structures were obtained using MRV technique without intravenous contrast. CONTRAST:  87m MULTIHANCE GADOBENATE DIMEGLUMINE 529 MG/ML IV SOLN COMPARISON:  Previous  MRI has from 09/17/2015, 08/10/2015, and 07/27/2015. FINDINGS: MRI HEAD: Study is moderate to severely degraded by motion artifact. No abnormal foci of restricted diffusion to suggest acute intracranial infarct. No definite foci of susceptibility artifact to suggest hemorrhage. Faint curvilinear hypodensity overlying the left parietal region on SWI sequence favored to be artifactual, as no definite FLAIR correlate seen in this region (series 1701, image 27). Minimal residual FLAIR signal abnormality within the left parietal lobe, not significantly changed relative to most recent examination from 09/17/2015, but markedly improved relative to earlier studies (series 10, image 22). FLAIR signal abnormality with associated encephalomalacia within the parasagittal posterior right frontal lobe is stable. No new FLAIR abnormality or vasogenic edema on this motion degraded study. Minimal sub cm residual curvilinear enhancement within the left temporal lobe is likely not significantly changed relative to prior (series 20, image 14). Additional faint nodular enhancement within the left parietal lobe not well seen on this motion degraded study. At the least, this cyst early not worsened as compared to prior. No definite new areas of enhancement on this severely motion degraded study. No other mass lesion. No hydrocephalus. No extra-axial fluid collection. Major intracranial vascular flow voids are grossly maintained. Craniocervical junction within normal limits. Visualized upper cervical spine grossly unremarkable. Pituitary gland normal. No acute abnormality about the orbits. Mild mucosal thickening within the left sphenoid sinus and ethmoidal air cells. Bilateral mastoid effusions noted, larger on the right. Bone marrow signal intensity within normal limits. No acute scalp soft tissue abnormality. MRA HEAD: ANTERIOR CIRCULATION: Study is severely degraded by motion artifact. Visualized distal cervical segments of the  internal carotid arteries are patent with antegrade flow. Petrous, cavernous, and supraclinoid segments are widely patent without high-grade stenosis or occlusion. A1 segments patent. Anterior communicating artery grossly normal. Anterior cerebral arteries are markedly attenuated by motion artifact and not well evaluated, but grossly patent to their distal aspects. M1 segments patent without occlusion or definite stenosis. MCA bifurcations grossly normal. Proximal MCA branches patent. Distal MCA branches not well evaluated on this exam. POSTERIOR CIRCULATION: Vertebral arteries patent to the vertebrobasilar junction. Left vertebral artery dominant. Left posterior inferior cerebral artery patent. Right posterior inferior cerebral artery not visualized. Basilar artery widely patent. Superior cerebellar arteries well opacified. Both posterior cerebral arteries arise from the basilar artery. PCAs are grossly patent to their min aspects, not well evaluated distally. No definite aneurysm. MRV HEAD: Dedicated MRV images demonstrate no filling defect to suggest acute venous sinus thrombosis. There is mild irregularity and attenuation of the mid aspect of the superior sagittal sinus, which may reflect sequela of remote thrombosis with re- cannulization (series 3 1501, image 13). Again, no convincing evidence for acute venous sinus thrombosis at this time. The transverse and sigmoid sinuses are widely patent. Proximal internal jugular veins are widely patent. The visualized proximal internal jugular veins are patent bilaterally. Straight sinus, vein of Galen, and internal cerebral veins appear patent. IMPRESSION: MRI HEAD IMPRESSION: 1. Severely motion degraded  study. 2. No new acute intracranial process. 3. No significant interval change in FLAIR signal abnormality within the left parietal lobe. Previously seen associated faint nodular enhancement within this region not well seen on this motion degraded study. Faint  subcentimeter curvilinear enhancement within the left temporal lobe likely not significantly changed. MRA HEAD IMPRESSION: 1. Severely motion degraded study. 2. No large or proximal arterial branch occlusion. No definite high-grade or correctable stenosis. Distal medium and small vessels are not well evaluated on this exam. MRV HEAD IMPRESSION: 1. Mildly motion degraded study. 2. Irregularity with attenuation of the mid aspect of the superior sagittal sinus, which may reflect sequela of remote venous sinus thrombosis with subsequent re- cannulization. 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 No filling defect or convincing evidence for acute venous sinus thrombosis at this time. 3. Otherwise normal MRV of the brain. Electronically Signed   By: Jeannine Boga M.D.   On: 09/24/2015 23:27   Dg Chest Port 1 View  09/25/2015  CLINICAL DATA:  Confusion, tachycardia, dizziness EXAM: PORTABLE CHEST 1 VIEW COMPARISON:  CT chest 07/31/2015 FINDINGS: The heart size and mediastinal contours are within normal limits. Both lungs are clear. The visualized skeletal structures are unremarkable. IMPRESSION: No active disease. Electronically Signed   By: Kathreen Devoid   On: 09/25/2015 16:13   Mr Mrv Head Wo Cm  09/24/2015  EXAM: MRI HEAD WITHOUT AND WITH CONTRAST MRA HEAD WITHOUT CONTRAST MRV HEAD WITHOUT CONTRAST TECHNIQUE: Multiplanar, multiecho pulse sequences of the brain and surrounding structures were obtained without and with intravenous contrast. Angiographic images of the intracranial venous structures were obtained using MRV technique without intravenous contrast. CONTRAST:  37m MULTIHANCE GADOBENATE DIMEGLUMINE 529 MG/ML IV SOLN COMPARISON:  Previous MRI has from 09/17/2015, 08/10/2015, and 07/27/2015. FINDINGS: MRI HEAD: Study is moderate to severely degraded by motion artifact. No  abnormal foci of restricted diffusion to suggest acute intracranial infarct. No definite foci of susceptibility artifact to suggest hemorrhage. Faint curvilinear hypodensity overlying the left parietal region on SWI sequence favored to be artifactual, as no definite FLAIR correlate seen in this region (series 1701, image 27). Minimal residual FLAIR signal abnormality within the left parietal lobe, not significantly changed relative to most recent examination from 09/17/2015, but markedly improved relative to earlier studies (series 10, image 22). FLAIR signal abnormality with associated encephalomalacia within the parasagittal posterior right frontal lobe is stable. No new FLAIR abnormality or vasogenic edema on this motion degraded study. Minimal sub cm residual curvilinear enhancement within the left temporal lobe is likely not significantly changed relative to prior (series 20, image 14). Additional faint nodular enhancement within the left parietal lobe not well seen on this motion degraded study. At the least, this cyst early not worsened as compared to prior. No definite new areas of enhancement on this severely motion degraded study. No other mass lesion. No hydrocephalus. No extra-axial fluid collection. Major intracranial vascular flow voids are grossly maintained. Craniocervical junction within normal limits. Visualized upper cervical spine grossly unremarkable. Pituitary gland normal. No acute abnormality about the orbits. Mild mucosal thickening within the left sphenoid sinus and ethmoidal air cells. Bilateral mastoid effusions noted, larger on the right. Bone marrow signal intensity within normal limits. No acute scalp soft tissue abnormality. MRA HEAD: ANTERIOR CIRCULATION: Study is severely degraded by motion artifact. Visualized distal cervical segments of the internal carotid arteries are patent with  antegrade flow. Petrous, cavernous, and supraclinoid segments are widely patent without high-grade  stenosis or occlusion. A1 segments patent. Anterior communicating artery grossly normal. Anterior cerebral arteries are markedly attenuated by motion artifact and not well evaluated, but grossly patent to their distal aspects. M1 segments patent without occlusion or definite stenosis. MCA bifurcations grossly normal. Proximal MCA branches patent. Distal MCA branches not well evaluated on this exam. POSTERIOR CIRCULATION: Vertebral arteries patent to the vertebrobasilar junction. Left vertebral artery dominant. Left posterior inferior cerebral artery patent. Right posterior inferior cerebral artery not visualized. Basilar artery widely patent. Superior cerebellar arteries well opacified. Both posterior cerebral arteries arise from the basilar artery. PCAs are grossly patent to their min aspects, not well evaluated distally. No definite aneurysm. MRV HEAD: Dedicated MRV images demonstrate no filling defect to suggest acute venous sinus thrombosis. There is mild irregularity and attenuation of the mid aspect of the superior sagittal sinus, which may reflect sequela of remote thrombosis with re- cannulization (series 3 1501, image 13). Again, no convincing evidence for acute venous sinus thrombosis at this time. The transverse and sigmoid sinuses are widely patent. Proximal internal jugular veins are widely patent. The visualized proximal internal jugular veins are patent bilaterally. Straight sinus, vein of Galen, and internal cerebral veins appear patent. IMPRESSION: MRI HEAD IMPRESSION: 1. Severely motion degraded study. 2. No new acute intracranial process. 3. No significant interval change in FLAIR signal abnormality within the left parietal lobe. Previously seen associated faint nodular enhancement within this region not well seen on this motion degraded study. Faint subcentimeter curvilinear enhancement within the left temporal lobe likely not significantly changed. MRA HEAD IMPRESSION: 1. Severely motion  degraded study. 2. No large or proximal arterial branch occlusion. No definite high-grade or correctable stenosis. Distal medium and small vessels are not well evaluated on this exam. MRV HEAD IMPRESSION: 1. Mildly motion degraded study. 2. Irregularity with attenuation of the mid aspect of the superior sagittal sinus, which may reflect sequela of remote venous sinus thrombosis with subsequent re- cannulization. 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 No filling defect or convincing evidence for acute venous sinus thrombosis at this time. 3. Otherwise normal MRV of the brain. Electronically Signed   By: Jeannine Boga M.D.   On: 09/24/2015 23:27    Scheduled Meds: . aspirin EC  81 mg Oral Daily  . atorvastatin  40 mg Oral Daily  . citalopram  20 mg Oral Daily  . dexamethasone  4 mg Intravenous 4 times per day  . enoxaparin (LOVENOX) injection  40 mg Subcutaneous Daily  . insulin aspart  0-20 Units Subcutaneous TID WC  . ipratropium-albuterol  3 mL Nebulization Once  . linagliptin  5 mg Oral Daily  . pantoprazole (PROTONIX) IV  40 mg Intravenous Q24H  . pioglitazone  30 mg Oral Daily  . sodium chloride flush  3 mL Intravenous Q12H   Continuous Infusions: . sodium chloride 0.9 % 1,000 mL infusion 100 mL/hr at 09/26/15 1051    Principal Problem:   Orthostatic hypotension Active Problems:   Type 2 diabetes mellitus (HCC)   Essential hypertension   Hypokalemia   OSA (obstructive sleep apnea)   Hypomagnesemia   Brain tumor ([place])    Time spent: 31 minutes    THOMPSON,DANIEL M.D. [facility] Pager [phone]. If 7PM-7AM, please contact night-coverage at [email or web address], password [facility] 09/26/2015, 2:41 PM

## 2015-09-26 NOTE — Progress Notes (Signed)
Pt arrived to floor at 1350 oriented to room.  A&0x4.  Call bell at reach.  Instructed to call for assistance.  Verbalized understanding.  Will continue to monitor.  Karie Kirks, Therapist, sports.

## 2015-09-26 NOTE — Telephone Encounter (Signed)
Husband called to let you know pt is back in E.R. Is going to be admitted. States she was at eye doctor yesterday and was experiencing a lot of dizziness and falls. Eye provider felt she needed to be seen at E.R. For evaluation and called EMS. Would like to know if they happened to draw any blood work that you needed. Please advise.

## 2015-09-26 NOTE — Telephone Encounter (Signed)
If it was not yet drawn at her PCP's office, I wanted the hypercoagulable panel ordered.

## 2015-09-26 NOTE — Progress Notes (Signed)
Pt requesting Cpap tonight.  Assigned floor RT made aware and instructed he had already spoke with pt.  Karie Kirks, Therapist, sports.

## 2015-09-26 NOTE — ED Notes (Signed)
Patient denies pain and is resting comfortably.  

## 2015-09-26 NOTE — ED Notes (Signed)
Pt. Has anxiety and feels anxious. Xanax 0.5 mg given per orders

## 2015-09-27 ENCOUNTER — Inpatient Hospital Stay (HOSPITAL_COMMUNITY): Payer: Medicare Other

## 2015-09-27 DIAGNOSIS — I455 Other specified heart block: Secondary | ICD-10-CM | POA: Clinically undetermined

## 2015-09-27 LAB — URINALYSIS, ROUTINE W REFLEX MICROSCOPIC
BILIRUBIN URINE: NEGATIVE
Glucose, UA: 250 mg/dL — AB
KETONES UR: NEGATIVE mg/dL
NITRITE: POSITIVE — AB
Protein, ur: NEGATIVE mg/dL
Specific Gravity, Urine: 1.023 (ref 1.005–1.030)
pH: 6 (ref 5.0–8.0)

## 2015-09-27 LAB — HOMOCYSTEINE: HOMOCYSTEINE-NORM: 11.2 umol/L (ref 0.0–15.0)

## 2015-09-27 LAB — CBC WITH DIFFERENTIAL/PLATELET
BASOS ABS: 0 10*3/uL (ref 0.0–0.1)
BASOS PCT: 0 %
Eosinophils Absolute: 0 10*3/uL (ref 0.0–0.7)
Eosinophils Relative: 0 %
HEMATOCRIT: 30.5 % — AB (ref 36.0–46.0)
HEMOGLOBIN: 10.9 g/dL — AB (ref 12.0–15.0)
LYMPHS PCT: 13 %
Lymphs Abs: 0.8 10*3/uL (ref 0.7–4.0)
MCH: 31.9 pg (ref 26.0–34.0)
MCHC: 35.7 g/dL (ref 30.0–36.0)
MCV: 89.2 fL (ref 78.0–100.0)
Monocytes Absolute: 0.2 10*3/uL (ref 0.1–1.0)
Monocytes Relative: 3 %
NEUTROS ABS: 5.5 10*3/uL (ref 1.7–7.7)
NEUTROS PCT: 84 %
Platelets: 265 10*3/uL (ref 150–400)
RBC: 3.42 MIL/uL — AB (ref 3.87–5.11)
RDW: 13.9 % (ref 11.5–15.5)
WBC: 6.5 10*3/uL (ref 4.0–10.5)

## 2015-09-27 LAB — ECHOCARDIOGRAM COMPLETE
Height: 66 in
Weight: 3284.8 [oz_av]

## 2015-09-27 LAB — URINE MICROSCOPIC-ADD ON

## 2015-09-27 LAB — COMPREHENSIVE METABOLIC PANEL
ALBUMIN: 3.1 g/dL — AB (ref 3.5–5.0)
ALT: 14 U/L (ref 14–54)
AST: 19 U/L (ref 15–41)
Alkaline Phosphatase: 48 U/L (ref 38–126)
Anion gap: 11 (ref 5–15)
BILIRUBIN TOTAL: 0.7 mg/dL (ref 0.3–1.2)
BUN: 12 mg/dL (ref 6–20)
CALCIUM: 8.8 mg/dL — AB (ref 8.9–10.3)
CHLORIDE: 105 mmol/L (ref 101–111)
CO2: 23 mmol/L (ref 22–32)
Creatinine, Ser: 0.75 mg/dL (ref 0.44–1.00)
GFR calc Af Amer: >60 mL/min (ref >60)
GLUCOSE: 189 mg/dL — AB (ref 65–99)
POTASSIUM: 4 mmol/L (ref 3.5–5.1)
Sodium: 139 mmol/L (ref 135–145)
Total Protein: 5.8 g/dL — ABNORMAL LOW (ref 6.5–8.1)

## 2015-09-27 LAB — BETA-2-GLYCOPROTEIN I ABS, IGG/M/A: Beta-2 Glyco I IgG: <9 GPI IgG units (ref 0–20)

## 2015-09-27 LAB — TSH: TSH: 0.76 u[IU]/mL (ref 0.350–4.500)

## 2015-09-27 LAB — GLUCOSE, CAPILLARY
GLUCOSE-CAPILLARY: 179 mg/dL — AB (ref 65–99)
GLUCOSE-CAPILLARY: 184 mg/dL — AB (ref 65–99)
Glucose-Capillary: 139 mg/dL — ABNORMAL HIGH (ref 65–99)
Glucose-Capillary: 184 mg/dL — ABNORMAL HIGH (ref 65–99)

## 2015-09-27 LAB — PROTEIN C, TOTAL: Protein C, Total: 100 % (ref 60–150)

## 2015-09-27 MED ORDER — INSULIN ASPART 100 UNIT/ML ~~LOC~~ SOLN
0.0000 [IU] | Freq: Three times a day (TID) | SUBCUTANEOUS | Status: DC
  Administered 2015-09-28 (×2): 4 [IU] via SUBCUTANEOUS

## 2015-09-27 MED ORDER — ACETAMINOPHEN 325 MG PO TABS
650.0000 mg | ORAL_TABLET | Freq: Four times a day (QID) | ORAL | Status: DC | PRN
  Administered 2015-09-28: 650 mg via ORAL
  Filled 2015-09-27 (×2): qty 2

## 2015-09-27 MED ORDER — DEXTROSE 5 % IV SOLN
1.0000 g | INTRAVENOUS | Status: DC
  Administered 2015-09-27: 1 g via INTRAVENOUS
  Filled 2015-09-27 (×3): qty 10

## 2015-09-27 MED ORDER — PANTOPRAZOLE SODIUM 40 MG PO TBEC
40.0000 mg | DELAYED_RELEASE_TABLET | Freq: Every day | ORAL | Status: DC
  Administered 2015-09-28: 40 mg via ORAL
  Filled 2015-09-27: qty 1

## 2015-09-27 NOTE — Progress Notes (Signed)
  Echocardiogram 2D Echocardiogram has been performed.  Kristi Kramer 09/27/2015, 1:05 PM

## 2015-09-27 NOTE — Progress Notes (Signed)
Pt monitor reviewed this morning and 2.81 second pause noted on monitor from this AM when pt asleep. Pt with no complaints this AM, A/Ox4, currently NSR in 60s. Dr. Grandville Silos paged to notify of event on monitor.

## 2015-09-27 NOTE — Progress Notes (Signed)
Received call from [facility] with [place], they do not provide staffing for her area, [place]; CM talked to patient again for second [place] choice, patient chose [facility]. Mary with [facility] called for arrangements. Mindi Slicker [facility] [phone]

## 2015-09-27 NOTE — Care Management Note (Signed)
Case Management Note  Patient Details  Name: Kristi Kramer MRN: [id number] Date of Birth: 1945/11/05  Subjective/Objective:        Admitted with Othrostatic hypotension            Action/Plan: Patient lives at home with her significant other. She have a rolling walker at home and agreed to [facility] services, choice offered, patient chose [place]. Butch Penny with [facility] called for arrangements.  Expected Discharge Date:   possibly 09/28/2015               Expected Discharge Plan:  [place]  Discharge planning Services  CM Consult Choice offered to:  Patient  HH Arranged:  PT, OT [facility] Agency:  [place]  Status of Service:  In process, will continue to follow  Sherrilyn Rist [phone] 09/27/2015, 10:12 AM

## 2015-09-27 NOTE — Progress Notes (Signed)
Occupational Therapy Evaluation Patient Details Name: Kristi Kramer MRN: [id number] DOB: 05-27-1946 Today's Date: 09/27/2015    History of Present Illness Kristi Kramer is a 70 y.o. 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   Clinical Impression   Pt admitted with the above diagnoses and presents with below problem list. Pt will benefit from continued acute OT to address the below listed deficits and maximize independence with BADLs prior to d/c to venue below. PTA pt was mostly mod I with ADLs, min A for tub transfer. Pt is currently min guard to min A with LB ADLs and transfers, likely mod A for tub transfer. Noted pt with tremulous BUE during initial sitting EOB; resolved within a few minutes. Impulsive and with some confusion at times. OT to continue to follow acutely.      Follow Up Recommendations  Home health OT;Supervision/Assistance - 24 hour    Equipment Recommendations  None recommended by OT    Recommendations for Other Services       Precautions / Restrictions Precautions Precautions: Fall      Mobility Bed Mobility Overal bed mobility: Modified Independent             General bed mobility comments: hob elevated   Transfers Overall transfer level: Needs assistance Equipment used: Rolling walker (2 wheeled) Transfers: Sit to/from Stand Sit to Stand: Min guard         General transfer comment: from EOB, toilet, and recliner, cues for technique with rw and to slow down.    Balance Overall balance assessment: Needs assistance Sitting-balance support: No upper extremity supported;Feet supported Sitting balance-Leahy Scale: Fair     Standing  balance support: Bilateral upper extremity supported;During functional activity Standing balance-Leahy Scale: Poor Standing balance comment: external support of sink during hand washing                            ADL Overall ADL's : Needs assistance/impaired Eating/Feeding: Set up;Sitting   Grooming: Set up;Min guard;Sitting;Standing Grooming Details (indicate cue type and reason): sttod to wash hands with external support of sink and rw Upper Body Bathing: Set up;Sitting   Lower Body Bathing: Minimal assistance;Sit to/from stand   Upper Body Dressing : Set up;Sitting   Lower Body Dressing: Minimal assistance;Sit to/from stand   Toilet Transfer: Min guard;Ambulation;Comfort height toilet;Grab bars;RW   Toileting- Clothing Manipulation and Hygiene: Set up;Sitting/lateral lean   Tub/ Shower Transfer: Moderate assistance;Tub transfer;Ambulation;Rolling walker   Functional mobility during ADLs: Min guard;Minimal assistance General ADL Comments: Pt with tremulous BUE upon initial rise to EOB. Mostly resolved after a few minutes.      Vision     Perception     Praxis      Pertinent Vitals/Pain Pain Assessment: No/denies pain     Hand Dominance Right   Extremity/Trunk Assessment Upper Extremity Assessment Upper Extremity Assessment: Generalized weakness   Lower Extremity Assessment Lower Extremity Assessment: Generalized weakness       Communication Communication Communication: No difficulties   Cognition Arousal/Alertness: Awake/alert Behavior During Therapy: Anxious;Impulsive Overall Cognitive Status: No family/caregiver present to determine baseline cognitive functioning (tearful about current health situation; at times confused)  Memory: Decreased short-term memory             General Comments    Pt tearful and expressing anxiety over shuffle of tests/transfers to locations and being sent to different doctors and for different  testing    Exercises       Shoulder Instructions      Home Living Family/patient expects to be discharged to:: Private residence Living Arrangements: Spouse/significant other Available Help at Discharge: Family;Available 24 hours/day Type of Home: Independent living facility Home Access: Stairs to enter Entrance Stairs-Number of Steps: 2 Entrance Stairs-Rails: Can reach both Home Layout: One level     Bathroom Shower/Tub: Occupational psychologist: Standard     Home Equipment: Environmental consultant - 2 wheels;Cane - single point;Bedside commode;Grab bars - tub/shower          Prior Functioning/Environment Level of Independence: Needs assistance  Gait / Transfers Assistance Needed: uses cane ADL's / Homemaking Assistance Needed: signifiant other assist with transfers in/out of tub        OT Diagnosis: Generalized weakness   OT Problem List: Decreased activity tolerance;Impaired balance (sitting and/or standing);Decreased cognition;Decreased safety awareness;Decreased knowledge of use of DME or AE;Decreased knowledge of precautions   OT Treatment/Interventions: Self-care/ADL training;Therapeutic exercise;Energy conservation;DME and/or AE instruction;Therapeutic activities;Patient/family education;Balance training    OT Goals(Current goals can be found in the care plan section) Acute Rehab OT Goals Patient Stated Goal: home OT Goal Formulation: With patient Time For Goal Achievement: 10/04/15 Potential to Achieve Goals: Good ADL Goals Pt Will Perform Grooming: with modified independence;sitting;standing Pt Will Perform Lower Body Bathing: with modified independence;sit to/from stand;with adaptive equipment Pt Will Perform Lower Body Dressing: with modified independence;sit to/from stand;with adaptive equipment Pt Will Perform Tub/Shower Transfer: Tub transfer;with min assist;ambulating;shower seat;rolling walker Additional ADL Goal #1: Pt will incorporate 1 energy  conservation stratefy into ADL task with only initial verbal cue given.  OT Frequency: Min 2X/week   Barriers to D/C:            Co-evaluation              End of Session Equipment Utilized During Treatment: Gait belt;Rolling walker Nurse Communication: Other (comment) (tearful during session. burning sensation after urination)  Activity Tolerance: Patient tolerated treatment well Patient left: in chair;with call bell/phone within reach;with chair alarm set   Time: 1025-8527 OT Time Calculation (min): 28 min Charges:  OT General Charges $OT Visit: 1 Procedure OT Evaluation $OT Eval Low Complexity: 1 Procedure OT Treatments $Self Care/Home Management : 8-22 mins G-Codes:    Hortencia Pilar 10-18-15, 11:29 AM

## 2015-09-27 NOTE — Progress Notes (Signed)
Physical Therapy Treatment Patient Details Name: Kristi Kramer MRN: [id number] DOB: February 19, 1946 Today's Date: 09/27/2015    History of Present Illness Kristi Kramer is a 70 y.o. 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    PT Comments    Pt was able to walk with PT and did note her O2 sats were stable at 98% by end of session with no SOB.  Pt is in bed due to being tired and positioned with HOB elevated as she reports she needs to be in that posture to be comfortable.  Follow Up Recommendations  Home health PT;Supervision/Assistance - 24 hour     Equipment Recommendations  None recommended by PT    Recommendations for Other Services Rehab consult     Precautions / Restrictions Precautions Precautions: Fall    Mobility  Bed Mobility Overal bed mobility: Modified Independent             General bed mobility comments: hob elevated   Transfers   Equipment used: Rolling walker (2 wheeled) Transfers: Sit to/from [facility] Sit to Stand: Min guard Stand pivot transfers: Min guard       General transfer comment: reminders for hand placement  Ambulation/Gait Ambulation/Gait assistance: Min guard Ambulation Distance (Feet): 200 Feet Assistive device: Rolling walker (2 wheeled) Gait Pattern/deviations: Step-through pattern;Wide base of support;Trunk flexed (pushes walker out in front ) Gait velocity: reduced Gait velocity interpretation: Below normal speed for age/gender General Gait Details: pushes walker out, reminders to respect her symptoms   Stairs            Wheelchair Mobility    Modified Rankin (Stroke Patients Only)        Balance     Sitting balance-Leahy Scale: Good       Standing balance-Leahy Scale: Fair                      Cognition Arousal/Alertness: Awake/alert Behavior During Therapy: Anxious;Impulsive Overall Cognitive Status: Impaired/Different from baseline Area of Impairment: Memory     Memory: Decreased short-term memory              Exercises      General Comments General comments (skin integrity, edema, etc.): tearful about her family not knowing where she is and how she is      Pertinent Vitals/Pain Pain Assessment: No/denies pain    Home Living                      Prior Function            PT Goals (current goals can now be found in the care plan section) Acute Rehab PT Goals Patient Stated Goal: to get home Progress towards PT goals: Progressing toward goals    Frequency  Min 3X/week    PT Plan Current plan remains appropriate    Co-evaluation             End of Session Equipment Utilized During Treatment: Gait belt Activity Tolerance: Patient tolerated treatment well Patient left: in bed;with call bell/phone within reach;with bed alarm set     Time: 1357-1425 PT Time Calculation (min) (ACUTE ONLY): 28 min  Charges:  $Gait Training: 8-22 mins $  Therapeutic Activity: 8-22 mins                    G Codes:      Ramond Dial 10-08-2015, 4:05 PM   Mee Hives, PT MS Acute Rehab Dept. Number: [facility] [phone] and [place] [phone]

## 2015-09-27 NOTE — Progress Notes (Signed)
Pt requesting another CPAP mask.  Notified Butch Penny and instructed she will come up and talk with pt.  Pt made aware.  Karie Kirks, Therapist, sports.

## 2015-09-27 NOTE — Progress Notes (Signed)
[facility] PROGRESS NOTE  Kristi Kramer NGE:[id number] DOB: [birth year] DOA: 09/25/2015 PCP: Fae Pippin  Assessment/Plan: #1 orthostatic hypotension Clinical improvement. Orthostasis improved. Continue told oral antihypertensive medications. Continue IV fluids. Follow.  #2 sinus pause Patient noted to have a sinus pause of 2.81 seconds this morning. Patient noted to be asymptomatic. Check a TSH. Continue to hold beta blocker. Follow.  #3 hypertension Stable. Blood pressure currently acceptable. Continue to hold antihypertensive medications. His blood pressure control is needed may resume patient's ARB.  #4 brain tumor Patient with recent MRI MRA head with no acute abnormalities which was done 09/24/2015. Admitting physician discussed case with neuro hospitalists who recommended resuming patient back on steroids. Patient has been placed back on Decadron. PT/OT. Patient needs to follow-up with neurology as outpatient.  #5 type 2 diabetes Hemoglobin A1c was 8.1 on 08/14/2015. CBGs have ranged from 179 -203. Continue traction to, sliding scale insulin.  #6 hyperlipidemia Continue Lipitor.  #7 hypokalemia Repleted.  #8 obstructive sleep apnea CPAP daily at bedtime.  #9 prophylaxis PPI for GI prophylaxis. Lovenox for DVT prophylaxis.   Code Status: Full Family Communication: Updated patient. No family present. Disposition Plan: Remain inpatient. Hopefully home tomorrow if no further sinus pauses and continued clinical improvement.   Consultants: None Procedures:  CT head 09/25/2015  Chest x-ray 09/25/2015  Antibiotics: None  HPI/Subjective: Patient states embedded on on admission. Dizziness improved. Patient noted to have a sinus pause of 2.81 seconds this morning while sleeping. Patient noted to be asymptomatic. Patient denies any chest pain. No shortness of breath.  Objective: Filed Vitals:   09/27/15 0408 09/27/15 0845  BP: 127/64 121/65  Pulse: 61 64   Temp: 97.8 F (36.6 C) 97.5 F (36.4 C)  Resp: 20 18    Intake/Output Summary (Last 24 hours) at 09/27/15 1012 Last data filed at 09/27/15 0844  Gross per 24 hour  Intake    480 ml  Output   2226 ml  Net  -1746 ml   Filed Weights   09/26/15 1446 09/27/15 0408  Weight: 93.26 kg (205 lb 9.6 oz) 93.123 kg (205 lb 4.8 oz)    Exam:   General:  NAD  Cardiovascular: RRR  Respiratory: CTAB  Abdomen: Soft/NT/ND/+BS  Musculoskeletal: No c/c/e  Data Reviewed: Basic Metabolic Panel:  Recent Labs Lab 09/24/15 1706 09/25/15 1354 09/25/15 1550 09/26/15 0346 09/26/15 0647 09/26/15 0653 09/27/15 0352  NA 138 136  --  140  --   --  139  K 3.2* 2.8*  --  3.7  --   --  4.0  CL 97* 95*  --  103  --   --  105  CO2 27 29  --  25  --   --  23  GLUCOSE 115* 167*  --  175*  --   --  189*  BUN 12 14  --  12  --   --  12  CREATININE 0.88 0.96  --  0.80  --  0.82 0.75  CALCIUM 10.4* 10.1  --  9.2  --   --  8.8*  MG  --   --  1.5*  --   --   --   --   PHOS  --   --   --  2.4* 2.7  --   --    Liver Function Tests:  Recent Labs Lab 09/27/15 0352  AST 19  ALT 14  ALKPHOS 48  BILITOT 0.7  PROT 5.8*  ALBUMIN 3.1*  No results for input(s): LIPASE, AMYLASE in the last 168 hours. No results for input(s): AMMONIA in the last 168 hours. CBC:  Recent Labs Lab 09/24/15 1706 09/25/15 1354 09/26/15 0653 09/27/15 0352  WBC 7.3 7.6 6.2 6.5  NEUTROABS  --  5.4  --  5.5  HGB 12.7 12.4 12.0 10.9*  HCT 36.8 35.4* 34.0* 30.5*  MCV 87.8 87.2 89.0 89.2  PLT 287 311 280 265   Cardiac Enzymes:  Recent Labs Lab 09/26/15 0346 09/26/15 0647  TROPONINI <0.03 <0.03   BNP (last 3 results) No results for input(s): BNP in the last 8760 hours.  ProBNP (last 3 results) No results for input(s): PROBNP in the last 8760 hours.  CBG:  Recent Labs Lab 09/26/15 0750 09/26/15 1224 09/26/15 1556 09/26/15 2145 09/27/15 0547  GLUCAP 170* 212* 179* 203* 179*    Recent Results  (from the past 240 hour(s))  Blood culture (routine x 2)     Status: None (Preliminary result)   Collection Time: 09/25/15  4:22 PM  Result Value Ref Range Status   Specimen Description BLOOD RIGHT HAND  Final   Special Requests BOTTLES DRAWN AEROBIC ONLY 5CC  Final   Culture NO GROWTH < 24 HOURS  Final   Report Status PENDING  Incomplete  Blood culture (routine x 2)     Status: None (Preliminary result)   Collection Time: 09/25/15  4:30 PM  Result Value Ref Range Status   Specimen Description BLOOD RIGHT ANTECUBITAL  Final   Special Requests BOTTLES DRAWN AEROBIC AND ANAEROBIC 5CC  Final   Culture NO GROWTH < 24 HOURS  Final   Report Status PENDING  Incomplete  Urine culture     Status: None (Preliminary result)   Collection Time: 09/25/15  5:29 PM  Result Value Ref Range Status   Specimen Description URINE, CATHETERIZED  Final   Special Requests NONE  Final   Culture CULTURE REINCUBATED FOR BETTER GROWTH  Final   Report Status PENDING  Incomplete     Studies: Ct Head Wo Contrast  09/25/2015  CLINICAL DATA:  Headache 2 months associated with blurry vision and dizziness. History of falls. EXAM: CT HEAD WITHOUT CONTRAST TECHNIQUE: Contiguous axial images were obtained from the base of the skull through the vertex without intravenous contrast. COMPARISON:  09/24/2015 FINDINGS: There is encephalomalacia in the high right frontal lobe. Global atrophy. Mild chronic ischemic changes in the periventricular white matter. No mass effect, midline shift, or acute hemorrhage. There is fluid in the bilateral mastoid air cells. Mucosal thickening in the left sphenoid sinus. No definite skull fracture. Left-sided ethmoidectomy has been performed. IMPRESSION: Chronic ischemic changes and atrophy. No acute intracranial pathology. There is fluid in the mastoid air cells. Electronically Signed   By: Marybelle Killings M.D.   On: 09/25/2015 16:32   Dg Chest Port 1 View  09/25/2015  CLINICAL DATA:  Confusion,  tachycardia, dizziness EXAM: PORTABLE CHEST 1 VIEW COMPARISON:  CT chest 07/31/2015 FINDINGS: The heart size and mediastinal contours are within normal limits. Both lungs are clear. The visualized skeletal structures are unremarkable. IMPRESSION: No active disease. Electronically Signed   By: Kathreen Devoid   On: 09/25/2015 16:13    Scheduled Meds: . aspirin EC  81 mg Oral Daily  . atorvastatin  40 mg Oral Daily  . citalopram  20 mg Oral Daily  . dexamethasone  4 mg Intravenous 4 times per day  . enoxaparin (LOVENOX) injection  40 mg Subcutaneous Daily  . insulin  aspart  0-20 Units Subcutaneous TID WC  . ipratropium-albuterol  3 mL Nebulization Once  . linagliptin  5 mg Oral Daily  . pantoprazole (PROTONIX) IV  40 mg Intravenous Q24H  . sodium chloride flush  3 mL Intravenous Q12H   Continuous Infusions: . sodium chloride 0.9 % 1,000 mL infusion 100 mL/hr at 09/26/15 1051    Principal Problem:   Orthostatic hypotension Active Problems:   Type 2 diabetes mellitus (HCC)   Essential hypertension   Hypokalemia   OSA (obstructive sleep apnea)   Hypomagnesemia   Brain tumor ([place])   DM type 2, goal A1c below 7   Sinus pause    Time spent: 19mns    TArrow[facility] LLCMD [facility] Pager [phone] If 7PM-7AM, please contact night-coverage at [email or web address], password [facility]3/03/2016, 10:12 AM  LOS: 1 day

## 2015-09-27 NOTE — Progress Notes (Signed)
Inpatient Diabetes Program Recommendations  AACE/ADA: New Consensus Statement on Inpatient Glycemic Control (2015)  Target Ranges:  Prepandial:   less than 140 mg/dL      Peak postprandial:   less than 180 mg/dL (1-2 hours)      Critically ill patients:  140 - 180 mg/dL   Results for Kristi Kramer, Kristi Kramer (MRN [id number]) as of 09/27/2015 08:40  Ref. Range 09/26/2015 07:50 09/26/2015 12:24 09/26/2015 15:56 09/26/2015 21:45  Glucose-Capillary Latest Ref Range: 65-99 mg/dL 170 (H) 212 (H) 179 (H) 203 (H)   Results for Kristi Kramer, Kristi Kramer (MRN [id number]) as of 09/27/2015 08:40  Ref. Range 09/27/2015 05:47  Glucose-Capillary Latest Ref Range: 65-99 mg/dL 179 (H)    Admit with: Orthostatic Hypotension  History: DM, CVA, Brain Tumor, COPD  Home DM Meds: Metformin 1000 mg bid       Glipizide 10 mg bid         Januvia 100 mg daily       Actos 30 mg daily  Current Insulin Orders: Novolog Resistant Correction Scale/ SSI (0-20 units) TID AC      Tradjenta 5 mg daily    -Note patient currently getting Decadron 4 mg QID.  -Fasting glucose slightly elevated and patient also having some elevated postprandial glucose levels as well.  Ate 100% of dinner last PM.  -Note Home Metformin, Actos, and Glipizide are on hold.    MD- Please consider the following in-hospital insulin adjustments while pt's home meds are on hold and while pt is getting Decadron:  1. Kristi Kramer low dose basal insulin- Lantus 9 units daily (0.1 units/kg)  2. Kristi Kramer low dose Novolog Meal Coverage- Novolog 3 units tidwc (Hold of pt eats <50% of meal)      --Will follow patient during hospitalization--  Wyn Quaker RN, MSN, CDE Diabetes Coordinator Inpatient Glycemic Control Team Team Pager: [phone] (8a-5p)

## 2015-09-27 NOTE — Clinical Social Work Note (Addendum)
CSW received referral for SNF.  PT recommending home health.  CSW to sign off please re-consult if social work needs arise.  Jones Broom. [place], MSW, [place]

## 2015-09-27 NOTE — Progress Notes (Addendum)
At 1200 attempted bladder scan,  no urine noted.  Pt tolerated well.  Urine sent for UA &CX.  As ordered by Dr. Grandville Silos.   Karie Kirks, Therapist, sports.

## 2015-09-28 ENCOUNTER — Telehealth: Payer: Self-pay

## 2015-09-28 DIAGNOSIS — R9389 Abnormal findings on diagnostic imaging of other specified body structures: Secondary | ICD-10-CM

## 2015-09-28 DIAGNOSIS — I639 Cerebral infarction, unspecified: Secondary | ICD-10-CM

## 2015-09-28 DIAGNOSIS — N390 Urinary tract infection, site not specified: Secondary | ICD-10-CM

## 2015-09-28 DIAGNOSIS — Z8673 Personal history of transient ischemic attack (TIA), and cerebral infarction without residual deficits: Secondary | ICD-10-CM | POA: Diagnosis present

## 2015-09-28 DIAGNOSIS — B952 Enterococcus as the cause of diseases classified elsewhere: Secondary | ICD-10-CM

## 2015-09-28 LAB — CARDIOLIPIN ANTIBODIES, IGG, IGM, IGA: Anticardiolipin IgM: <9 MPL U/mL (ref 0–12)

## 2015-09-28 LAB — CBC WITH DIFFERENTIAL/PLATELET
BASOS ABS: 0 10*3/uL (ref 0.0–0.1)
BASOS PCT: 0 %
EOS PCT: 0 %
Eosinophils Absolute: 0 10*3/uL (ref 0.0–0.7)
HCT: 31.7 % — ABNORMAL LOW (ref 36.0–46.0)
Hemoglobin: 10.6 g/dL — ABNORMAL LOW (ref 12.0–15.0)
Lymphocytes Relative: 18 %
Lymphs Abs: 1.1 10*3/uL (ref 0.7–4.0)
MCH: 30.1 pg (ref 26.0–34.0)
MCHC: 33.4 g/dL (ref 30.0–36.0)
MCV: 90.1 fL (ref 78.0–100.0)
MONO ABS: 0.3 10*3/uL (ref 0.1–1.0)
Monocytes Relative: 5 %
Neutro Abs: 4.5 10*3/uL (ref 1.7–7.7)
Neutrophils Relative %: 77 %
PLATELETS: 287 10*3/uL (ref 150–400)
RBC: 3.52 MIL/uL — AB (ref 3.87–5.11)
RDW: 14.2 % (ref 11.5–15.5)
WBC: 5.9 10*3/uL (ref 4.0–10.5)

## 2015-09-28 LAB — COMPREHENSIVE METABOLIC PANEL
ALBUMIN: 3 g/dL — AB (ref 3.5–5.0)
ALT: 17 U/L (ref 14–54)
AST: 24 U/L (ref 15–41)
Alkaline Phosphatase: 42 U/L (ref 38–126)
Anion gap: 10 (ref 5–15)
BUN: 14 mg/dL (ref 6–20)
CHLORIDE: 106 mmol/L (ref 101–111)
CO2: 22 mmol/L (ref 22–32)
Calcium: 8.7 mg/dL — ABNORMAL LOW (ref 8.9–10.3)
Creatinine, Ser: 0.69 mg/dL (ref 0.44–1.00)
GFR calc Af Amer: >60 mL/min (ref >60)
GFR calc non Af Amer: >60 mL/min (ref >60)
GLUCOSE: 174 mg/dL — AB (ref 65–99)
POTASSIUM: 3.9 mmol/L (ref 3.5–5.1)
SODIUM: 138 mmol/L (ref 135–145)
Total Bilirubin: 0.6 mg/dL (ref 0.3–1.2)
Total Protein: 5.4 g/dL — ABNORMAL LOW (ref 6.5–8.1)

## 2015-09-28 LAB — BETA-2-GLYCOPROTEIN I ABS, IGG/M/A
Beta-2 Glyco I IgG: <9 GPI IgG units (ref 0–20)
Beta-2-Glycoprotein I IgM: <9 GPI IgM units (ref 0–32)

## 2015-09-28 LAB — GLUCOSE, CAPILLARY
Glucose-Capillary: 165 mg/dL — ABNORMAL HIGH (ref 65–99)
Glucose-Capillary: 196 mg/dL — ABNORMAL HIGH (ref 65–99)

## 2015-09-28 LAB — URINE CULTURE

## 2015-09-28 LAB — LUPUS ANTICOAGULANT PANEL
DRVVT: 35.4 s (ref 0.0–44.0)
PTT Lupus Anticoagulant: 27.4 s (ref 0.0–43.6)

## 2015-09-28 LAB — PROTEIN S, TOTAL: Protein S Ag, Total: 139 % (ref 60–150)

## 2015-09-28 LAB — PROTHROMBIN GENE MUTATION

## 2015-09-28 LAB — PROTEIN C ACTIVITY: Protein C Activity: 130 % (ref 73–180)

## 2015-09-28 LAB — PROTEIN C, TOTAL: PROTEIN C, TOTAL: 98 % (ref 60–150)

## 2015-09-28 LAB — PROTEIN S ACTIVITY: PROTEIN S ACTIVITY: 121 % (ref 63–140)

## 2015-09-28 MED ORDER — DEXAMETHASONE 4 MG PO TABS
4.0000 mg | ORAL_TABLET | Freq: Three times a day (TID) | ORAL | Status: DC
  Administered 2015-09-28: 4 mg via ORAL
  Filled 2015-09-28: qty 1

## 2015-09-28 MED ORDER — DEXAMETHASONE 4 MG PO TABS: 4.0000 mg | ORAL_TABLET | Freq: Three times a day (TID) | ORAL | Status: DC

## 2015-09-28 MED ORDER — LOSARTAN POTASSIUM 50 MG PO TABS
100.0000 mg | ORAL_TABLET | Freq: Every day | ORAL | Status: DC
  Administered 2015-09-28: 100 mg via ORAL
  Filled 2015-09-28: qty 2

## 2015-09-28 MED ORDER — AMOXICILLIN-POT CLAVULANATE 875-125 MG PO TABS: 1.0000 | ORAL_TABLET | Freq: Two times a day (BID) | ORAL | Status: AC

## 2015-09-28 NOTE — Progress Notes (Signed)
Occupational Therapy Treatment and Discharge Patient Details Name: Kristi Kramer MRN: [id number] DOB: Feb 17, 1946 Today's Date: 09/28/2015    History of present illness Kristi Kramer is a 70 y.o. 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   OT comments  This 70 yo female admitted with above presents to acute OT at a Mod I level, no further OT needs, we will sign off.  Follow Up Recommendations  Supervision - Intermittent    Equipment Recommendations  None recommended by OT       Precautions / Restrictions Precautions Precautions: Fall Restrictions Weight Bearing Restrictions: No       Mobility Bed Mobility Overal bed mobility: Modified Independent             General bed mobility comments: hob elevated  (20 degrees) and use of rail  Transfers Overall transfer level: Modified independent Equipment used: Rolling walker (2 wheeled) Transfers: Sit to/from Stand Sit to Stand: Modified independent (Device/Increase time)         General transfer comment: Pt ambulated 100 feet with RW without balance issues and no SOB    Balance Overall balance assessment: No apparent balance deficits (not formally assessed)                                 ADL Overall ADL's : Modified independent;Independent (at RW level)                                       General ADL Comments: Pt reports she does have a shower seat at home. No SOB and no balance issues noted with use of RW. I did go over energy conservation handout with pt even though no DOE noted today.                Cognition   Behavior During Therapy: WFL for tasks  assessed/performed Overall Cognitive Status: Within Functional Limits for tasks assessed                                    Pertinent Vitals/ Pain       Pain Assessment: No/denies pain (headache earlier, but pt reports RN gave her something for it)         Frequency Min 2X/week     Progress Toward Goals  OT Goals(current goals can now be found in the care plan section)  Progress towards OT goals: Goals met/education completed, patient discharged from OT (tub transfer not addressed to pt has a walk in shower with shower seat and I do not foresee any issues with this based on how well pt is currently getting around)     Plan Discharge plan needs to be updated       End of Session Equipment Utilized During Treatment: Gait belt;Rolling walker   Activity Tolerance Patient tolerated treatment well   Patient Left in chair;with call bell/phone within reach   Nurse Communication          Time: 8187-9766 OT Time Calculation (min): 24  min  Charges: OT General Charges $OT Visit: 1 Procedure OT Treatments $Self Care/Home Management : 23-37 mins  Almon Register [phone] 09/28/2015, 11:09 AM

## 2015-09-28 NOTE — Care Management Important Message (Signed)
Important Message  Patient Details  Name: Kristi Kramer MRN: [id number] Date of Birth: 1946/05/26   Medicare Important Message Given:  Yes    [name] P [place] 09/28/2015, 12:54 PM

## 2015-09-28 NOTE — Telephone Encounter (Signed)
Attempted to reach husband. Left message on home machine. Will attempt again.

## 2015-09-28 NOTE — Progress Notes (Signed)
Patient discharged to home at 2:40 pm  accompanied by spouse and NT via wheelchair. Discharge instructions given. Patient and spouse verbalized understanding. All personal belongings given. Patient's stable. Telemetry box and IV removed prior to discharge and site in good condition.

## 2015-09-28 NOTE — Discharge Summary (Addendum)
Physician Discharge Summary  Kristi Kramer BHA:[id number] DOB: [birth year] DOA: 09/25/2015  PCP: Fae Pippin  Admit date: 09/25/2015 Discharge date: 09/28/2015  Time spent: 65 minutes  Recommendations for Outpatient Follow-up:  1. Follow-up with CONROY,NATHAN, PA-C in 1-2 weeks. On follow-up patient's blood pressure needs to be reassessed as patient's beta blocker was discontinued secondary to sinus pause during the hospitalization.Patient will also need basic metabolic profile and magnesium checked to follow up on electrolytes and renal function. 2. Patient is to follow-up with Dr. Tomi Likens of neurology as scheduled. On follow-up hypercoagulable panel need to be followed up upon.   Discharge Diagnoses:  Principal Problem:   Orthostatic hypotension Active Problems:   Type 2 diabetes mellitus (HCC)   Essential hypertension   Hypokalemia   OSA (obstructive sleep apnea)   Hypomagnesemia   DM type 2, goal A1c below 7   Sinus pause   Chronic cerebral venous infarction [facility])   Enterococcus UTI   Discharge Condition: Stable and improved  Diet recommendation: Carb modified  Filed Weights   09/26/15 1446 09/27/15 0408 09/28/15 0445  Weight: 93.26 kg (205 lb 9.6 oz) 93.123 kg (205 lb 4.8 oz) 94.031 kg (207 lb 4.8 oz)    History of present illness:  Per Dr Noralee Space is a 70 y.o. female with a past medical history of type 2 diabetes, hypertension, hyperlipidemia, brain tumors without primary, CVA 2, obstructive sleep apnea on CPAP, COPD, sarcoidosis, anxiety, GERD, osteoarthritis who returned to the emergency department after being seen yesterday and on 09/17/2015 due to persistent visual changes and dizziness for 2 weeks.  Per patient, over the past 2 weeks she has been having persistent blurred vision, dizziness and left frontal temporal headache. She denied worsening LE UE weakness or sensory changes. Denied any speech abnormalities or language comprehension issues. She  complained of nausea, but denied emesis, abdominal pain, diarrhea or constipation. She stated that she has been feeling very dizzy when standing up from a seated or getting up from bed, but denied chest pain, palpitations, diaphoresis, pitting edema of the lower extremities, PND orthopnea.  The patient has been followed by Dr. Tomi Likens of [facility] and was supposed to have neurosurgery by Dr. Arnoldo Morale, but the surgery was canceled due to MRI showing interval improvement on the enhancing lesions and decreased vasogenic edema. She was on dexamethasone earlier in February for 2 weeks, but he was tapered off since then.  In the ER, the patient became very hypotensive and tachycardic after standing up. She had to receive IV fluid boluses and hospitalists are admitting her for further evaluation and observation.  Extensive imaging done in the past few months and recent workup did not show any significant new abnormality.   Hospital Course:  #1 orthostatic hypotension Patient had presented with dizzy spells and noted to be hypotensive and tachycardic in the emergency room after standing up. Patient was noted to be significantly orthostatic. Patient's antihypertensive medications were held and patient was hydrated with IV fluids. Patient's orthostasis resolved and patient had no further dizzy spells. Patient be discharged home in stable and improved condition. Outpatient follow-up.   #2 sinus pause Patient noted to have a sinus pause of 2.81 seconds on the morning of 09/27/2015. Patient had remained asymptomatic. TSH was obtained which was within normal limits. Patient's beta blocker had been on hold since admission and will be discontinued on discharge. Patient did not have any further sinus pauses during the hospitalization and will follow-up with PCP as outpatient.  #  3 enterococcus UTI/Klebsiella UTI During the hospitalization patient had complaints of dysuria and urinary frequency. Urinalysis was  repeated that was positive for UTI. Patient was placed on IV Rocephin. Urine cultures came back as greater than 100,000 colonies of enterococcus species that was pansensitive. And Klebsiella pneumonia species. Patient was transitioned to oral Augmentin and will be discharged on 6 more days of Augmentin to complete a course of antibiotic treatment. Outpatient follow-up.  #4 hypertension Stable. On admission patient was noted to be orthostatic and a such patient's antihypertensive medications were held. Patient was hydrated with IV fluids. Patient's blood pressure remained stable. Patient be discharged home on the ARB. Patient beta blocker has been discontinued secondary to sinus part noted during the hospitalization. Outpatient follow-up.   #5 probable chronic cerebral venous infarcts Patient prior to admission initially there was some concern of brain tumors and patient was being followed by neurology and neurosurgery. Patient with recent MRI MRA head with no acute abnormalities which was done 09/24/2015. Admitting physician discussed case with neuro hospitalists who recommended resuming patient back on steroids. Patient was placed back on Decadron and seen by physical therapy. Spoke with Dr. Tomi Likens of neurology who had reviewed patient's MRI films extensively with radiologist and felt patient likely did not have a brain tumor however patient likely did have some venous infarcts that seemed to have improved on recent MRI. It was recommended to discharge patient on a Decadron taper. Hypercoagulable panel was ordered and was pending at the time of discharge which will be followed up upon per neurology. Patient will be discharged home and is to follow-up with neurology as outpatient as previously scheduled.   #6 type 2 diabetes Hemoglobin A1c was 8.1 on 08/14/2015. Patient was maintained on a sliding scale insulin during the hospitalization and oral hypoglycemic agents were held. Oral hypoglycemic agents will be  resumed on discharge.  #7 hyperlipidemia Continued on home regimen of Lipitor.  #8 hypokalemia Repleted.  #9 obstructive sleep apnea CPAP daily at bedtime.   Procedures:  CT head 09/25/2015  Chest x-ray 09/25/2015    Consultations:  None  Discharge Exam: Filed Vitals:   09/28/15 0445 09/28/15 1107  BP:  150/66  Pulse: 58 62  Temp: 98.1 F (36.7 C) 98.5 F (36.9 C)  Resp: 18 18    General: NAD Cardiovascular: RRR Respiratory: CTAB  Discharge Instructions   Discharge Instructions    Diet Carb Modified    Complete by:  As directed      Discharge instructions    Complete by:  As directed   Follow up with Dr Tomi Likens, neurology as scheduled. Follow up with CONROY,NATHAN, PA-C in 1-2 weeks.     Increase activity slowly    Complete by:  As directed           Current Discharge Medication List    START taking these medications   Details  amoxicillin-clavulanate (AUGMENTIN) 875-125 MG tablet Take 1 tablet by mouth 2 (two) times daily. Take for 6 days then stop. Qty: 12 tablet, Refills: 0    dexamethasone (DECADRON) 4 MG tablet Take 1-3 tablets (4-12 mg total) by mouth every 8 (eight) hours. Take 1 tablet('4mg'$ ) 3 times daily x 2 days, then 1 tablet('4mg'$ ) 2 times daily x 2 days, then 1 tablet('4mg'$ ) daily x 2 days then stop. Qty: 12 tablet, Refills: 0      CONTINUE these medications which have NOT CHANGED   Details  albuterol (PROVENTIL) (2.5 MG/3ML) 0.083% nebulizer solution Take 2.5 mg by  nebulization 3 (three) times daily as needed for wheezing or shortness of breath.    ALPRAZolam (XANAX) 0.5 MG tablet Take 0.5-1 mg by mouth daily as needed. For anxiety.    aspirin EC 81 MG tablet Take 81 mg by mouth daily.     atorvastatin (LIPITOR) 20 MG tablet Take 40 mg by mouth daily.  Refills: 1    citalopram (CELEXA) 20 MG tablet Take 20 mg by mouth daily.    DEXILANT 60 MG capsule Take 60 mg by mouth daily as needed (for heartburn).  Refills: 11    glipiZIDE  (GLUCOTROL XL) 10 MG 24 hr tablet Take 10 mg by mouth 2 (two) times daily with breakfast and lunch. Refills: 3    ibuprofen (ADVIL,MOTRIN) 200 MG tablet Take 600 mg by mouth every 6 (six) hours as needed for moderate pain. Reported on 09/20/2015    loratadine (CLARITIN) 10 MG tablet Take 10 mg by mouth daily as needed for allergies or rhinitis.     losartan (COZAAR) 100 MG tablet Take 100 mg by mouth daily.    metFORMIN (GLUCOPHAGE) 1000 MG tablet Take 1,000 mg by mouth 2 (two) times daily with a meal.  Refills: 3    pioglitazone (ACTOS) 30 MG tablet Take 30 mg by mouth daily.    sitaGLIPtin (JANUVIA) 100 MG tablet Take 100 mg by mouth daily.    VENTOLIN HFA 108 (90 BASE) MCG/ACT inhaler Inhale 2 puffs into the lungs every 6 (six) hours as needed. wheezing Refills: 3    promethazine (PHENERGAN) 12.5 MG tablet Take 12.5 mg by mouth 3 (three) times daily as needed for nausea or vomiting.      STOP taking these medications     hydrochlorothiazide (HYDRODIURIL) 25 MG tablet      metoprolol (TOPROL-XL) 50 MG 24 hr tablet      VALIUM 5 MG tablet        Allergies  Allergen Reactions  . Mometasone Anaphylaxis    Turns Red  . Codeine Nausea And Vomiting  . Sulfa Antibiotics Nausea And Vomiting   Follow-up Information    Follow up with [facility].   Specialty:  [place]   Why:  They will do your home health care at your home   Contact information:   [place] [id number] [phone]       Follow up with CONROY,NATHAN, PA-C. Schedule an appointment as soon as possible for a visit in 1 week.   Specialty:  Physician Assistant   Why:  f/u in 1-2 weeks.   Contact information:   [place] [address] [phone]       Follow up with Dudley Major, DO.   Specialty:  Neurology   Why:  f/u as scheduled.   Contact information:   [address] [place]  [id number] [phone]        The results of significant diagnostics from this hospitalization (including imaging, microbiology, ancillary and laboratory) are listed below for reference.    Significant Diagnostic Studies: Ct Head Wo Contrast  09/25/2015  CLINICAL DATA:  Headache 2 months associated with blurry vision and dizziness. History of falls. EXAM: CT HEAD WITHOUT CONTRAST TECHNIQUE: Contiguous axial images were obtained from the base of the skull through the vertex without intravenous contrast. COMPARISON:  09/24/2015 FINDINGS: There is encephalomalacia in the high right frontal lobe. Global atrophy. Mild chronic ischemic  changes in the periventricular white matter. No mass effect, midline shift, or acute hemorrhage. There is fluid in the bilateral mastoid air cells. Mucosal thickening in the left sphenoid sinus. No definite skull fracture. Left-sided ethmoidectomy has been performed. IMPRESSION: Chronic ischemic changes and atrophy. No acute intracranial pathology. There is fluid in the mastoid air cells. Electronically Signed   By: Marybelle Killings M.D.   On: 09/25/2015 16:32   Ct Head Wo Contrast  09/24/2015  CLINICAL DATA:  Blurred vision with left frontal headache for 2 weeks. History of stroke. EXAM: CT HEAD WITHOUT CONTRAST TECHNIQUE: Contiguous axial images were obtained from the base of the skull through the vertex without intravenous contrast. COMPARISON:  CT 07/22/2015.  MRI 08/10/2015 and 09/17/2015. FINDINGS: Brain: There is no evidence of acute intracranial hemorrhage, mass lesion, brain edema or extra-axial fluid collection. The ventricles and subarachnoid spaces are appropriately sized for age. There is no CT evidence of acute cortical infarction. There is stable encephalomalacia medially in the right frontal lobe from a remote infarct. The vasogenic edema noted on the prior CT in the left frontal lobe has resolved, as seen on recent MRIs. Bones/sinuses/visualized face: There are  stable postsurgical changes status post left mastoidectomy. A right mastoid effusion is unchanged. Chronic mucosal thickening is present in the left division of the sphenoid sinus. There are no air-fluid levels. The calvarium is intact. IMPRESSION: 1. No acute intracranial findings. 2. Left frontal lobe process demonstrated on CT 2 months ago demonstrates continued improvement as seen on interval MRIs. Electronically Signed   By: Richardean Sale M.D.   On: 09/24/2015 18:12   Mr Angiogram Head Wo Contrast  09/24/2015  EXAM: MRI HEAD WITHOUT AND WITH CONTRAST MRA HEAD WITHOUT CONTRAST MRV HEAD WITHOUT CONTRAST TECHNIQUE: Multiplanar, multiecho pulse sequences of the brain and surrounding structures were obtained without and with intravenous contrast. Angiographic images of the intracranial venous structures were obtained using MRV technique without intravenous contrast. CONTRAST:  72m MULTIHANCE GADOBENATE DIMEGLUMINE 529 MG/ML IV SOLN COMPARISON:  Previous MRI has from 09/17/2015, 08/10/2015, and 07/27/2015. FINDINGS: MRI HEAD: Study is moderate to severely degraded by motion artifact. No abnormal foci of restricted diffusion to suggest acute intracranial infarct. No definite foci of susceptibility artifact to suggest hemorrhage. Faint curvilinear hypodensity overlying the left parietal region on SWI sequence favored to be artifactual, as no definite FLAIR correlate seen in this region (series 1701, image 27). Minimal residual FLAIR signal abnormality within the left parietal lobe, not significantly changed relative to most recent examination from 09/17/2015, but markedly improved relative to earlier studies (series 10, image 22). FLAIR signal abnormality with associated encephalomalacia within the parasagittal posterior right frontal lobe is stable. No new FLAIR abnormality or vasogenic edema on this motion degraded study. Minimal sub cm residual curvilinear enhancement within the left temporal lobe is likely not  significantly changed relative to prior (series 20, image 14). Additional faint nodular enhancement within the left parietal lobe not well seen on this motion degraded study. At the least, this cyst early not worsened as compared to prior. No definite new areas of enhancement on this severely motion degraded study. No other mass lesion. No hydrocephalus. No extra-axial fluid collection. Major intracranial vascular flow voids are grossly maintained. Craniocervical junction within normal limits. Visualized upper cervical spine grossly unremarkable. Pituitary gland normal. No acute abnormality about the orbits. Mild mucosal thickening within the left sphenoid sinus and ethmoidal air cells. Bilateral mastoid effusions noted, larger on the right. Bone marrow signal intensity  within normal limits. No acute scalp soft tissue abnormality. MRA HEAD: ANTERIOR CIRCULATION: Study is severely degraded by motion artifact. Visualized distal cervical segments of the internal carotid arteries are patent with antegrade flow. Petrous, cavernous, and supraclinoid segments are widely patent without high-grade stenosis or occlusion. A1 segments patent. Anterior communicating artery grossly normal. Anterior cerebral arteries are markedly attenuated by motion artifact and not well evaluated, but grossly patent to their distal aspects. M1 segments patent without occlusion or definite stenosis. MCA bifurcations grossly normal. Proximal MCA branches patent. Distal MCA branches not well evaluated on this exam. POSTERIOR CIRCULATION: Vertebral arteries patent to the vertebrobasilar junction. Left vertebral artery dominant. Left posterior inferior cerebral artery patent. Right posterior inferior cerebral artery not visualized. Basilar artery widely patent. Superior cerebellar arteries well opacified. Both posterior cerebral arteries arise from the basilar artery. PCAs are grossly patent to their min aspects, not well evaluated distally. No  definite aneurysm. MRV HEAD: Dedicated MRV images demonstrate no filling defect to suggest acute venous sinus thrombosis. There is mild irregularity and attenuation of the mid aspect of the superior sagittal sinus, which may reflect sequela of remote thrombosis with re- cannulization (series 3 1501, image 13). Again, no convincing evidence for acute venous sinus thrombosis at this time. The transverse and sigmoid sinuses are widely patent. Proximal internal jugular veins are widely patent. The visualized proximal internal jugular veins are patent bilaterally. Straight sinus, vein of Galen, and internal cerebral veins appear patent. IMPRESSION: MRI HEAD IMPRESSION: 1. Severely motion degraded study. 2. No new acute intracranial process. 3. No significant interval change in FLAIR signal abnormality within the left parietal lobe. Previously seen associated faint nodular enhancement within this region not well seen on this motion degraded study. Faint subcentimeter curvilinear enhancement within the left temporal lobe likely not significantly changed. MRA HEAD IMPRESSION: 1. Severely motion degraded study. 2. No large or proximal arterial branch occlusion. No definite high-grade or correctable stenosis. Distal medium and small vessels are not well evaluated on this exam. MRV HEAD IMPRESSION: 1. Mildly motion degraded study. 2. Irregularity with attenuation of the mid aspect of the superior sagittal sinus, which may reflect sequela of remote venous sinus thrombosis with subsequent re- cannulization. 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 No filling defect or convincing evidence for acute venous sinus thrombosis at this time. 3. Otherwise normal MRV of the brain. Electronically Signed   By: Jeannine Boga M.D.   On: 09/24/2015 23:27   Mr Jeri Cos NI Contrast  09/24/2015  EXAM: MRI  HEAD WITHOUT AND WITH CONTRAST MRA HEAD WITHOUT CONTRAST MRV HEAD WITHOUT CONTRAST TECHNIQUE: Multiplanar, multiecho pulse sequences of the brain and surrounding structures were obtained without and with intravenous contrast. Angiographic images of the intracranial venous structures were obtained using MRV technique without intravenous contrast. CONTRAST:  109m MULTIHANCE GADOBENATE DIMEGLUMINE 529 MG/ML IV SOLN COMPARISON:  Previous MRI has from 09/17/2015, 08/10/2015, and 07/27/2015. FINDINGS: MRI HEAD: Study is moderate to severely degraded by motion artifact. No abnormal foci of restricted diffusion to suggest acute intracranial infarct. No definite foci of susceptibility artifact to suggest hemorrhage. Faint curvilinear hypodensity overlying the left parietal region on SWI sequence favored to be artifactual, as no definite FLAIR correlate seen in this region (series 1701, image 27). Minimal residual FLAIR signal abnormality within the left parietal lobe, not significantly changed relative to most recent examination from 09/17/2015, but  markedly improved relative to earlier studies (series 10, image 22). FLAIR signal abnormality with associated encephalomalacia within the parasagittal posterior right frontal lobe is stable. No new FLAIR abnormality or vasogenic edema on this motion degraded study. Minimal sub cm residual curvilinear enhancement within the left temporal lobe is likely not significantly changed relative to prior (series 20, image 14). Additional faint nodular enhancement within the left parietal lobe not well seen on this motion degraded study. At the least, this cyst early not worsened as compared to prior. No definite new areas of enhancement on this severely motion degraded study. No other mass lesion. No hydrocephalus. No extra-axial fluid collection. Major intracranial vascular flow voids are grossly maintained. Craniocervical junction within normal limits. Visualized upper cervical spine  grossly unremarkable. Pituitary gland normal. No acute abnormality about the orbits. Mild mucosal thickening within the left sphenoid sinus and ethmoidal air cells. Bilateral mastoid effusions noted, larger on the right. Bone marrow signal intensity within normal limits. No acute scalp soft tissue abnormality. MRA HEAD: ANTERIOR CIRCULATION: Study is severely degraded by motion artifact. Visualized distal cervical segments of the internal carotid arteries are patent with antegrade flow. Petrous, cavernous, and supraclinoid segments are widely patent without high-grade stenosis or occlusion. A1 segments patent. Anterior communicating artery grossly normal. Anterior cerebral arteries are markedly attenuated by motion artifact and not well evaluated, but grossly patent to their distal aspects. M1 segments patent without occlusion or definite stenosis. MCA bifurcations grossly normal. Proximal MCA branches patent. Distal MCA branches not well evaluated on this exam. POSTERIOR CIRCULATION: Vertebral arteries patent to the vertebrobasilar junction. Left vertebral artery dominant. Left posterior inferior cerebral artery patent. Right posterior inferior cerebral artery not visualized. Basilar artery widely patent. Superior cerebellar arteries well opacified. Both posterior cerebral arteries arise from the basilar artery. PCAs are grossly patent to their min aspects, not well evaluated distally. No definite aneurysm. MRV HEAD: Dedicated MRV images demonstrate no filling defect to suggest acute venous sinus thrombosis. There is mild irregularity and attenuation of the mid aspect of the superior sagittal sinus, which may reflect sequela of remote thrombosis with re- cannulization (series 3 1501, image 13). Again, no convincing evidence for acute venous sinus thrombosis at this time. The transverse and sigmoid sinuses are widely patent. Proximal internal jugular veins are widely patent. The visualized proximal internal jugular  veins are patent bilaterally. Straight sinus, vein of Galen, and internal cerebral veins appear patent. IMPRESSION: MRI HEAD IMPRESSION: 1. Severely motion degraded study. 2. No new acute intracranial process. 3. No significant interval change in FLAIR signal abnormality within the left parietal lobe. Previously seen associated faint nodular enhancement within this region not well seen on this motion degraded study. Faint subcentimeter curvilinear enhancement within the left temporal lobe likely not significantly changed. MRA HEAD IMPRESSION: 1. Severely motion degraded study. 2. No large or proximal arterial branch occlusion. No definite high-grade or correctable stenosis. Distal medium and small vessels are not well evaluated on this exam. MRV HEAD IMPRESSION: 1. Mildly motion degraded study. 2. Irregularity with attenuation of the mid aspect of the superior sagittal sinus, which may reflect sequela of remote venous sinus thrombosis with subsequent re- cannulization. 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 No filling defect or convincing evidence for acute venous sinus thrombosis at this time. 3. Otherwise normal MRV of the brain. Electronically Signed   By: Pincus Badder.D.  On: 09/24/2015 23:27   Mr Jeri Cos AJ Contrast  09/18/2015  CLINICAL DATA:  Known brain tumors, 2 weeks of worsening dizziness and LEFT temporal occipital pain. Three days of blurry vision. History of hypertension, diabetes. EXAM: MRI HEAD WITHOUT AND WITH CONTRAST TECHNIQUE: Multiplanar, multiecho pulse sequences of the brain and surrounding structures were obtained without and with intravenous contrast. CONTRAST:  20 cc MultiHance COMPARISON:  MRI of the brain August 10, 2015 and MRI head July 27, 2015 and MRI head September 27, 2014 FINDINGS: No reduced diffusion to suggest acute ischemia. No  susceptibility artifact to suggest hemorrhage. Moderately motion degraded FLAIR sequence, with very mild residual FLAIR hyperintense signal LEFT parietal lobe, markedly improved. Similar FLAIR hyperintense signal mesial RIGHT occipital lobe associated with encephalomalacia. Faint subcentimeter enhancement LEFT temporal lobe, less conspicuous than prior examination. Minimal faint enhancement LEFT parietal convexity of prior area of nodular enhancement. No new areas of abnormal enhancement though, postcontrast sequences are moderately motion degraded. The ventricles and sulci are normal for patient's age. Patchy supratentorial white matter T2 hyperintensities exclusive of the aforementioned abnormality without midline shift or mass effect. No abnormal extra-axial fluid collections, abnormal enhancement or extra-axial masses. Normal major intracranial vascular flow voids present at the skull base. Ocular globes and orbital contents are nonsuspicious, status post bilateral ocular lens implants. Persistent mastoid effusions. Status post LEFT wall down mastoidectomy. Increasing LEFT sphenoid mucosal thickening without paranasal sinus air-fluid levels. No abnormal sellar expansion. No cerebellar tonsillar ectopia. No suspicious calvarial bone marrow signal. IMPRESSION: No acute intracranial process. Continued resolution of subcentimeter enhancing nodularity LEFT temporal parietal lobes, without new lesions on moderately motion degraded post contrast sequences. Mesial RIGHT posterior frontal lobe infarct. Electronically Signed   By: Elon Alas M.D.   On: 09/18/2015 00:21   Dg Chest Port 1 View  09/25/2015  CLINICAL DATA:  Confusion, tachycardia, dizziness EXAM: PORTABLE CHEST 1 VIEW COMPARISON:  CT chest 07/31/2015 FINDINGS: The heart size and mediastinal contours are within normal limits. Both lungs are clear. The visualized skeletal structures are unremarkable. IMPRESSION: No active disease. Electronically Signed    By: Kathreen Devoid   On: 09/25/2015 16:13   Mr Mrv Head Wo Cm  09/24/2015  EXAM: MRI HEAD WITHOUT AND WITH CONTRAST MRA HEAD WITHOUT CONTRAST MRV HEAD WITHOUT CONTRAST TECHNIQUE: Multiplanar, multiecho pulse sequences of the brain and surrounding structures were obtained without and with intravenous contrast. Angiographic images of the intracranial venous structures were obtained using MRV technique without intravenous contrast. CONTRAST:  92m MULTIHANCE GADOBENATE DIMEGLUMINE 529 MG/ML IV SOLN COMPARISON:  Previous MRI has from 09/17/2015, 08/10/2015, and 07/27/2015. FINDINGS: MRI HEAD: Study is moderate to severely degraded by motion artifact. No abnormal foci of restricted diffusion to suggest acute intracranial infarct. No definite foci of susceptibility artifact to suggest hemorrhage. Faint curvilinear hypodensity overlying the left parietal region on SWI sequence favored to be artifactual, as no definite FLAIR correlate seen in this region (series 1701, image 27). Minimal residual FLAIR signal abnormality within the left parietal lobe, not significantly changed relative to most recent examination from 09/17/2015, but markedly improved relative to earlier studies (series 10, image 22). FLAIR signal abnormality with associated encephalomalacia within the parasagittal posterior right frontal lobe is stable. No new FLAIR abnormality or vasogenic edema on this motion degraded study. Minimal sub cm residual curvilinear enhancement within the left temporal lobe is likely not significantly changed relative to prior (series 20, image 14). Additional faint nodular enhancement within the left parietal lobe not  well seen on this motion degraded study. At the least, this cyst early not worsened as compared to prior. No definite new areas of enhancement on this severely motion degraded study. No other mass lesion. No hydrocephalus. No extra-axial fluid collection. Major intracranial vascular flow voids are grossly  maintained. Craniocervical junction within normal limits. Visualized upper cervical spine grossly unremarkable. Pituitary gland normal. No acute abnormality about the orbits. Mild mucosal thickening within the left sphenoid sinus and ethmoidal air cells. Bilateral mastoid effusions noted, larger on the right. Bone marrow signal intensity within normal limits. No acute scalp soft tissue abnormality. MRA HEAD: ANTERIOR CIRCULATION: Study is severely degraded by motion artifact. Visualized distal cervical segments of the internal carotid arteries are patent with antegrade flow. Petrous, cavernous, and supraclinoid segments are widely patent without high-grade stenosis or occlusion. A1 segments patent. Anterior communicating artery grossly normal. Anterior cerebral arteries are markedly attenuated by motion artifact and not well evaluated, but grossly patent to their distal aspects. M1 segments patent without occlusion or definite stenosis. MCA bifurcations grossly normal. Proximal MCA branches patent. Distal MCA branches not well evaluated on this exam. POSTERIOR CIRCULATION: Vertebral arteries patent to the vertebrobasilar junction. Left vertebral artery dominant. Left posterior inferior cerebral artery patent. Right posterior inferior cerebral artery not visualized. Basilar artery widely patent. Superior cerebellar arteries well opacified. Both posterior cerebral arteries arise from the basilar artery. PCAs are grossly patent to their min aspects, not well evaluated distally. No definite aneurysm. MRV HEAD: Dedicated MRV images demonstrate no filling defect to suggest acute venous sinus thrombosis. There is mild irregularity and attenuation of the mid aspect of the superior sagittal sinus, which may reflect sequela of remote thrombosis with re- cannulization (series 3 1501, image 13). Again, no convincing evidence for acute venous sinus thrombosis at this time. The transverse and sigmoid sinuses are widely patent.  Proximal internal jugular veins are widely patent. The visualized proximal internal jugular veins are patent bilaterally. Straight sinus, vein of Galen, and internal cerebral veins appear patent. IMPRESSION: MRI HEAD IMPRESSION: 1. Severely motion degraded study. 2. No new acute intracranial process. 3. No significant interval change in FLAIR signal abnormality within the left parietal lobe. Previously seen associated faint nodular enhancement within this region not well seen on this motion degraded study. Faint subcentimeter curvilinear enhancement within the left temporal lobe likely not significantly changed. MRA HEAD IMPRESSION: 1. Severely motion degraded study. 2. No large or proximal arterial branch occlusion. No definite high-grade or correctable stenosis. Distal medium and small vessels are not well evaluated on this exam. MRV HEAD IMPRESSION: 1. Mildly motion degraded study. 2. Irregularity with attenuation of the mid aspect of the superior sagittal sinus, which may reflect sequela of remote venous sinus thrombosis with subsequent re- cannulization. 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 No filling defect or convincing evidence for acute venous sinus thrombosis at this time. 3. Otherwise normal MRV of the brain. Electronically Signed   By: Jeannine Boga M.D.   On: 09/24/2015 23:27    Microbiology: Recent Results (from the past 240 hour(s))  Blood culture (routine x 2)     Status: None (Preliminary result)   Collection Time: 09/25/15  4:22 PM  Result Value Ref Range Status   Specimen Description BLOOD RIGHT HAND  Final   Special Requests BOTTLES DRAWN AEROBIC ONLY 5CC  Final   Culture NO GROWTH 2 DAYS  Final   Report Status PENDING  Incomplete  Blood culture (routine x 2)     Status: None (Preliminary result)   Collection Time: 09/25/15  4:30 PM  Result  Value Ref Range Status   Specimen Description BLOOD RIGHT ANTECUBITAL  Final   Special Requests BOTTLES DRAWN AEROBIC AND ANAEROBIC 5CC  Final   Culture NO GROWTH 2 DAYS  Final   Report Status PENDING  Incomplete  Urine culture     Status: None   Collection Time: 09/25/15  5:29 PM  Result Value Ref Range Status   Specimen Description URINE, CATHETERIZED  Final   Special Requests NONE  Final   Culture   Final    >=100,000 COLONIES/mL ENTEROCOCCUS SPECIES 20,000 COLONIES/mL KLEBSIELLA PNEUMONIAE    Report Status 09/28/2015 FINAL  Final   Organism ID, Bacteria ENTEROCOCCUS SPECIES  Final   Organism ID, Bacteria KLEBSIELLA PNEUMONIAE  Final      Susceptibility   Klebsiella pneumoniae - MIC*    AMPICILLIN 16 RESISTANT Resistant     CEFAZOLIN <=4 SENSITIVE Sensitive     CEFTRIAXONE <=1 SENSITIVE Sensitive     CIPROFLOXACIN <=0.25 SENSITIVE Sensitive     GENTAMICIN <=1 SENSITIVE Sensitive     IMIPENEM <=0.25 SENSITIVE Sensitive     NITROFURANTOIN <=16 SENSITIVE Sensitive     TRIMETH/SULFA <=20 SENSITIVE Sensitive     AMPICILLIN/SULBACTAM 4 SENSITIVE Sensitive     PIP/TAZO <=4 SENSITIVE Sensitive     * 20,000 COLONIES/mL KLEBSIELLA PNEUMONIAE   Enterococcus species - MIC*    AMPICILLIN <=2 SENSITIVE Sensitive     LEVOFLOXACIN 1 SENSITIVE Sensitive     NITROFURANTOIN <=16 SENSITIVE Sensitive     VANCOMYCIN 1 SENSITIVE Sensitive     * >=100,000 COLONIES/mL ENTEROCOCCUS SPECIES     Labs: Basic Metabolic Panel:  Recent Labs Lab 09/24/15 1706 09/25/15 1354 09/25/15 1550 09/26/15 0346 09/26/15 0647 09/26/15 0653 09/27/15 0352 09/28/15 0430  NA 138 136  --  140  --   --  139 138  K 3.2* 2.8*  --  3.7  --   --  4.0 3.9  CL 97* 95*  --  103  --   --  105 106  CO2 27 29  --  25  --   --  23 22  GLUCOSE 115* 167*  --  175*  --   --  189* 174*  BUN 12 14  --  12  --   --  12 14  CREATININE 0.88 0.96  --  0.80  --  0.82 0.75 0.69  CALCIUM 10.4* 10.1  --  9.2  --   --  8.8* 8.7*   MG  --   --  1.5*  --   --   --   --   --   PHOS  --   --   --  2.4* 2.7  --   --   --    Liver Function Tests:  Recent Labs Lab 09/27/15 0352 09/28/15 0430  AST 19 24  ALT 14 17  ALKPHOS 48 42  BILITOT 0.7 0.6  PROT 5.8* 5.4*  ALBUMIN 3.1* 3.0*   No results for input(s): LIPASE, AMYLASE in the last 168 hours. No results for input(s): AMMONIA in the last 168 hours. CBC:  Recent Labs Lab 09/24/15 1706 09/25/15 1354 09/26/15 0653 09/27/15 0352 09/28/15 0430  WBC 7.3 7.6 6.2 6.5 5.9  NEUTROABS  --  5.4  --  5.5 4.5  HGB 12.7 12.4  12.0 10.9* 10.6*  HCT 36.8 35.4* 34.0* 30.5* 31.7*  MCV 87.8 87.2 89.0 89.2 90.1  PLT 287 311 280 265 287   Cardiac Enzymes:  Recent Labs Lab 09/26/15 0346 09/26/15 0647  TROPONINI <0.03 <0.03   BNP: BNP (last 3 results) No results for input(s): BNP in the last 8760 hours.  ProBNP (last 3 results) No results for input(s): PROBNP in the last 8760 hours.  CBG:  Recent Labs Lab 09/27/15 1125 09/27/15 1653 09/27/15 2136 09/28/15 0550 09/28/15 1140  GLUCAP 184* 139* 184* 165* 196*       Signed:  Meoshia Billing MD.  [facility] 09/28/2015, 12:44 PM

## 2015-09-28 NOTE — Telephone Encounter (Signed)
Instead, we can get carotid doppler.  I still want to make sure that the hypercoagulable panel was performed.  If it was done at her PCP's office, I would like it sent to [place] when available.

## 2015-09-28 NOTE — Telephone Encounter (Signed)
Spoke with patient. Will come have labs drawn on Monday. Pt is getting out of hospital today, but still weak. Line disconnected. Tried to reconnect. No answer.

## 2015-09-28 NOTE — Telephone Encounter (Signed)
Tasha from [facility] called. Pt had scans performed while in hospital, excluding MRA of neck. Appointment for that has been cancelled due to hospital guidelines to free up that appointment slot. Should MRA of neck be rescheduled for after release? Please advise and review Imaging. Thanks.

## 2015-09-29 LAB — URINE CULTURE

## 2015-09-30 DIAGNOSIS — Z792 Long term (current) use of antibiotics: Secondary | ICD-10-CM | POA: Diagnosis not present

## 2015-09-30 DIAGNOSIS — F17210 Nicotine dependence, cigarettes, uncomplicated: Secondary | ICD-10-CM | POA: Diagnosis not present

## 2015-09-30 DIAGNOSIS — I495 Sick sinus syndrome: Secondary | ICD-10-CM | POA: Diagnosis not present

## 2015-09-30 DIAGNOSIS — G4733 Obstructive sleep apnea (adult) (pediatric): Secondary | ICD-10-CM | POA: Diagnosis not present

## 2015-09-30 DIAGNOSIS — I951 Orthostatic hypotension: Secondary | ICD-10-CM | POA: Diagnosis not present

## 2015-09-30 DIAGNOSIS — B952 Enterococcus as the cause of diseases classified elsewhere: Secondary | ICD-10-CM | POA: Diagnosis not present

## 2015-09-30 DIAGNOSIS — F329 Major depressive disorder, single episode, unspecified: Secondary | ICD-10-CM | POA: Diagnosis not present

## 2015-09-30 DIAGNOSIS — E785 Hyperlipidemia, unspecified: Secondary | ICD-10-CM | POA: Diagnosis not present

## 2015-09-30 DIAGNOSIS — Z7982 Long term (current) use of aspirin: Secondary | ICD-10-CM | POA: Diagnosis not present

## 2015-09-30 DIAGNOSIS — N390 Urinary tract infection, site not specified: Secondary | ICD-10-CM | POA: Diagnosis not present

## 2015-09-30 DIAGNOSIS — M1991 Primary osteoarthritis, unspecified site: Secondary | ICD-10-CM | POA: Diagnosis not present

## 2015-09-30 DIAGNOSIS — Z8673 Personal history of transient ischemic attack (TIA), and cerebral infarction without residual deficits: Secondary | ICD-10-CM | POA: Diagnosis not present

## 2015-09-30 DIAGNOSIS — Z9181 History of falling: Secondary | ICD-10-CM | POA: Diagnosis not present

## 2015-09-30 DIAGNOSIS — I10 Essential (primary) hypertension: Secondary | ICD-10-CM | POA: Diagnosis not present

## 2015-09-30 DIAGNOSIS — Z7984 Long term (current) use of oral hypoglycemic drugs: Secondary | ICD-10-CM | POA: Diagnosis not present

## 2015-09-30 DIAGNOSIS — E119 Type 2 diabetes mellitus without complications: Secondary | ICD-10-CM | POA: Diagnosis not present

## 2015-09-30 DIAGNOSIS — F419 Anxiety disorder, unspecified: Secondary | ICD-10-CM | POA: Diagnosis not present

## 2015-09-30 DIAGNOSIS — J449 Chronic obstructive pulmonary disease, unspecified: Secondary | ICD-10-CM | POA: Diagnosis not present

## 2015-09-30 DIAGNOSIS — D496 Neoplasm of unspecified behavior of brain: Secondary | ICD-10-CM | POA: Diagnosis not present

## 2015-09-30 DIAGNOSIS — B961 Klebsiella pneumoniae [K. pneumoniae] as the cause of diseases classified elsewhere: Secondary | ICD-10-CM | POA: Diagnosis not present

## 2015-09-30 LAB — CULTURE, BLOOD (ROUTINE X 2)
CULTURE: NO GROWTH
CULTURE: NO GROWTH

## 2015-10-01 ENCOUNTER — Ambulatory Visit (HOSPITAL_COMMUNITY): Admission: RE | Admit: 2015-10-01 | Payer: Medicare Other | Source: Ambulatory Visit

## 2015-10-01 LAB — FACTOR 5 LEIDEN

## 2015-10-01 LAB — PROTHROMBIN GENE MUTATION

## 2015-10-02 DIAGNOSIS — B952 Enterococcus as the cause of diseases classified elsewhere: Secondary | ICD-10-CM | POA: Diagnosis not present

## 2015-10-02 DIAGNOSIS — N390 Urinary tract infection, site not specified: Secondary | ICD-10-CM | POA: Diagnosis not present

## 2015-10-02 DIAGNOSIS — E119 Type 2 diabetes mellitus without complications: Secondary | ICD-10-CM | POA: Diagnosis not present

## 2015-10-02 DIAGNOSIS — I951 Orthostatic hypotension: Secondary | ICD-10-CM | POA: Diagnosis not present

## 2015-10-02 DIAGNOSIS — I495 Sick sinus syndrome: Secondary | ICD-10-CM | POA: Diagnosis not present

## 2015-10-02 DIAGNOSIS — B961 Klebsiella pneumoniae [K. pneumoniae] as the cause of diseases classified elsewhere: Secondary | ICD-10-CM | POA: Diagnosis not present

## 2015-10-03 DIAGNOSIS — I10 Essential (primary) hypertension: Secondary | ICD-10-CM | POA: Diagnosis not present

## 2015-10-03 DIAGNOSIS — Z1389 Encounter for screening for other disorder: Secondary | ICD-10-CM | POA: Diagnosis not present

## 2015-10-03 DIAGNOSIS — I455 Other specified heart block: Secondary | ICD-10-CM | POA: Diagnosis not present

## 2015-10-03 DIAGNOSIS — I639 Cerebral infarction, unspecified: Secondary | ICD-10-CM | POA: Diagnosis not present

## 2015-10-03 DIAGNOSIS — E669 Obesity, unspecified: Secondary | ICD-10-CM | POA: Diagnosis not present

## 2015-10-03 DIAGNOSIS — Z6833 Body mass index (BMI) 33.0-33.9, adult: Secondary | ICD-10-CM | POA: Diagnosis not present

## 2015-10-03 DIAGNOSIS — I951 Orthostatic hypotension: Secondary | ICD-10-CM | POA: Diagnosis not present

## 2015-10-03 DIAGNOSIS — E119 Type 2 diabetes mellitus without complications: Secondary | ICD-10-CM | POA: Diagnosis not present

## 2015-10-05 ENCOUNTER — Ambulatory Visit (HOSPITAL_COMMUNITY): Payer: Medicare Other

## 2015-10-05 ENCOUNTER — Ambulatory Visit (HOSPITAL_COMMUNITY): Admission: RE | Admit: 2015-10-05 | Payer: Medicare Other | Source: Ambulatory Visit

## 2015-10-05 DIAGNOSIS — N390 Urinary tract infection, site not specified: Secondary | ICD-10-CM | POA: Diagnosis not present

## 2015-10-05 DIAGNOSIS — E119 Type 2 diabetes mellitus without complications: Secondary | ICD-10-CM | POA: Diagnosis not present

## 2015-10-05 DIAGNOSIS — I495 Sick sinus syndrome: Secondary | ICD-10-CM | POA: Diagnosis not present

## 2015-10-05 DIAGNOSIS — B961 Klebsiella pneumoniae [K. pneumoniae] as the cause of diseases classified elsewhere: Secondary | ICD-10-CM | POA: Diagnosis not present

## 2015-10-05 DIAGNOSIS — I951 Orthostatic hypotension: Secondary | ICD-10-CM | POA: Diagnosis not present

## 2015-10-05 DIAGNOSIS — B952 Enterococcus as the cause of diseases classified elsewhere: Secondary | ICD-10-CM | POA: Diagnosis not present

## 2015-10-08 DIAGNOSIS — E119 Type 2 diabetes mellitus without complications: Secondary | ICD-10-CM | POA: Diagnosis not present

## 2015-10-08 DIAGNOSIS — I495 Sick sinus syndrome: Secondary | ICD-10-CM | POA: Diagnosis not present

## 2015-10-08 DIAGNOSIS — B952 Enterococcus as the cause of diseases classified elsewhere: Secondary | ICD-10-CM | POA: Diagnosis not present

## 2015-10-08 DIAGNOSIS — I951 Orthostatic hypotension: Secondary | ICD-10-CM | POA: Diagnosis not present

## 2015-10-08 DIAGNOSIS — B961 Klebsiella pneumoniae [K. pneumoniae] as the cause of diseases classified elsewhere: Secondary | ICD-10-CM | POA: Diagnosis not present

## 2015-10-08 DIAGNOSIS — N390 Urinary tract infection, site not specified: Secondary | ICD-10-CM | POA: Diagnosis not present

## 2015-10-09 DIAGNOSIS — I951 Orthostatic hypotension: Secondary | ICD-10-CM | POA: Diagnosis not present

## 2015-10-09 DIAGNOSIS — B961 Klebsiella pneumoniae [K. pneumoniae] as the cause of diseases classified elsewhere: Secondary | ICD-10-CM | POA: Diagnosis not present

## 2015-10-09 DIAGNOSIS — B952 Enterococcus as the cause of diseases classified elsewhere: Secondary | ICD-10-CM | POA: Diagnosis not present

## 2015-10-09 DIAGNOSIS — N390 Urinary tract infection, site not specified: Secondary | ICD-10-CM | POA: Diagnosis not present

## 2015-10-09 DIAGNOSIS — I495 Sick sinus syndrome: Secondary | ICD-10-CM | POA: Diagnosis not present

## 2015-10-09 DIAGNOSIS — E119 Type 2 diabetes mellitus without complications: Secondary | ICD-10-CM | POA: Diagnosis not present

## 2015-10-10 DIAGNOSIS — B952 Enterococcus as the cause of diseases classified elsewhere: Secondary | ICD-10-CM | POA: Diagnosis not present

## 2015-10-10 DIAGNOSIS — E119 Type 2 diabetes mellitus without complications: Secondary | ICD-10-CM | POA: Diagnosis not present

## 2015-10-10 DIAGNOSIS — B961 Klebsiella pneumoniae [K. pneumoniae] as the cause of diseases classified elsewhere: Secondary | ICD-10-CM | POA: Diagnosis not present

## 2015-10-10 DIAGNOSIS — I495 Sick sinus syndrome: Secondary | ICD-10-CM | POA: Diagnosis not present

## 2015-10-10 DIAGNOSIS — I951 Orthostatic hypotension: Secondary | ICD-10-CM | POA: Diagnosis not present

## 2015-10-10 DIAGNOSIS — N390 Urinary tract infection, site not specified: Secondary | ICD-10-CM | POA: Diagnosis not present

## 2015-10-16 DIAGNOSIS — I951 Orthostatic hypotension: Secondary | ICD-10-CM | POA: Diagnosis not present

## 2015-10-16 DIAGNOSIS — N390 Urinary tract infection, site not specified: Secondary | ICD-10-CM | POA: Diagnosis not present

## 2015-10-16 DIAGNOSIS — B952 Enterococcus as the cause of diseases classified elsewhere: Secondary | ICD-10-CM | POA: Diagnosis not present

## 2015-10-16 DIAGNOSIS — E119 Type 2 diabetes mellitus without complications: Secondary | ICD-10-CM | POA: Diagnosis not present

## 2015-10-16 DIAGNOSIS — B961 Klebsiella pneumoniae [K. pneumoniae] as the cause of diseases classified elsewhere: Secondary | ICD-10-CM | POA: Diagnosis not present

## 2015-10-16 DIAGNOSIS — I495 Sick sinus syndrome: Secondary | ICD-10-CM | POA: Diagnosis not present

## 2015-10-18 ENCOUNTER — Other Ambulatory Visit: Payer: Self-pay | Admitting: Neurology

## 2015-10-18 ENCOUNTER — Encounter: Payer: Self-pay | Admitting: Neurology

## 2015-10-18 ENCOUNTER — Ambulatory Visit (INDEPENDENT_AMBULATORY_CARE_PROVIDER_SITE_OTHER): Payer: Medicare Other | Admitting: Neurology

## 2015-10-18 ENCOUNTER — Other Ambulatory Visit: Payer: Medicare Other

## 2015-10-18 VITALS — BP 128/64 | HR 93 | Ht 65.0 in | Wt 202.5 lb

## 2015-10-18 DIAGNOSIS — G43709 Chronic migraine without aura, not intractable, without status migrainosus: Secondary | ICD-10-CM

## 2015-10-18 DIAGNOSIS — I639 Cerebral infarction, unspecified: Secondary | ICD-10-CM | POA: Diagnosis not present

## 2015-10-18 DIAGNOSIS — I951 Orthostatic hypotension: Secondary | ICD-10-CM

## 2015-10-18 DIAGNOSIS — Z720 Tobacco use: Secondary | ICD-10-CM

## 2015-10-18 DIAGNOSIS — D689 Coagulation defect, unspecified: Secondary | ICD-10-CM | POA: Diagnosis not present

## 2015-10-18 DIAGNOSIS — E119 Type 2 diabetes mellitus without complications: Secondary | ICD-10-CM | POA: Diagnosis not present

## 2015-10-18 DIAGNOSIS — B961 Klebsiella pneumoniae [K. pneumoniae] as the cause of diseases classified elsewhere: Secondary | ICD-10-CM | POA: Diagnosis not present

## 2015-10-18 DIAGNOSIS — N390 Urinary tract infection, site not specified: Secondary | ICD-10-CM | POA: Diagnosis not present

## 2015-10-18 DIAGNOSIS — I495 Sick sinus syndrome: Secondary | ICD-10-CM | POA: Diagnosis not present

## 2015-10-18 DIAGNOSIS — B952 Enterococcus as the cause of diseases classified elsewhere: Secondary | ICD-10-CM | POA: Diagnosis not present

## 2015-10-18 MED ORDER — TOPIRAMATE 25 MG PO TABS: 25.0000 mg | ORAL_TABLET | Freq: Every day | ORAL | Status: DC

## 2015-10-18 NOTE — Progress Notes (Signed)
Chart forwarded.  
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
## 2015-10-18 NOTE — Patient Instructions (Signed)
1.  For headache, start topiramate '25mg'$  at bedtime.  Call in 4 weeks with update regarding headaches and we can increase dose if needed.  Possible side effects include: impaired thinking, sedation, paresthesias (numbness and tingling) and weight loss.  It may cause dehydration and there is a small risk for kidney stones, so make sure to stay hydrated with water during the day.  There is also a very small risk for glaucoma, so if you notice any change in your vision while taking this medication, see an ophthalmologist.   2.  Take tramadol for severe headache but limit to no more than 2 days out of the week. 3.  Check carotid doppler.   4.  Follow up with the eye doctor 5.  Have Dr. Tobie Lords address blood pressure and get up to date with mammogram 6.  We will check one other blood test, MTHFR gene mutation 7.  Follow up in 4-5 months.

## 2015-10-22 DIAGNOSIS — B952 Enterococcus as the cause of diseases classified elsewhere: Secondary | ICD-10-CM | POA: Diagnosis not present

## 2015-10-22 DIAGNOSIS — E119 Type 2 diabetes mellitus without complications: Secondary | ICD-10-CM | POA: Diagnosis not present

## 2015-10-22 DIAGNOSIS — B961 Klebsiella pneumoniae [K. pneumoniae] as the cause of diseases classified elsewhere: Secondary | ICD-10-CM | POA: Diagnosis not present

## 2015-10-22 DIAGNOSIS — I951 Orthostatic hypotension: Secondary | ICD-10-CM | POA: Diagnosis not present

## 2015-10-22 DIAGNOSIS — I495 Sick sinus syndrome: Secondary | ICD-10-CM | POA: Diagnosis not present

## 2015-10-22 DIAGNOSIS — N390 Urinary tract infection, site not specified: Secondary | ICD-10-CM | POA: Diagnosis not present

## 2015-10-24 ENCOUNTER — Ambulatory Visit
Admission: RE | Admit: 2015-10-24 | Discharge: 2015-10-24 | Disposition: A | Discharge status: Home / Self Care | Payer: Medicare Other | Source: Ambulatory Visit | Attending: Neurology | Admitting: Neurology

## 2015-10-24 DIAGNOSIS — E119 Type 2 diabetes mellitus without complications: Secondary | ICD-10-CM | POA: Diagnosis not present

## 2015-10-24 DIAGNOSIS — B961 Klebsiella pneumoniae [K. pneumoniae] as the cause of diseases classified elsewhere: Secondary | ICD-10-CM | POA: Diagnosis not present

## 2015-10-24 DIAGNOSIS — I495 Sick sinus syndrome: Secondary | ICD-10-CM | POA: Diagnosis not present

## 2015-10-24 DIAGNOSIS — B952 Enterococcus as the cause of diseases classified elsewhere: Secondary | ICD-10-CM | POA: Diagnosis not present

## 2015-10-24 DIAGNOSIS — N390 Urinary tract infection, site not specified: Secondary | ICD-10-CM | POA: Diagnosis not present

## 2015-10-24 DIAGNOSIS — I951 Orthostatic hypotension: Secondary | ICD-10-CM | POA: Diagnosis not present

## 2015-10-24 DIAGNOSIS — I63233 Cerebral infarction due to unspecified occlusion or stenosis of bilateral carotid arteries: Secondary | ICD-10-CM | POA: Diagnosis not present

## 2015-10-24 DIAGNOSIS — I639 Cerebral infarction, unspecified: Secondary | ICD-10-CM

## 2015-10-25 ENCOUNTER — Telehealth: Payer: Self-pay

## 2015-10-25 LAB — MTHFR DNA ANALYSIS

## 2015-10-25 NOTE — Telephone Encounter (Signed)
-----   Message from Pieter Partridge, DO sent at 10/25/2015  7:23 AM EDT ----- Doppler reveals no significant blockage.

## 2015-10-25 NOTE — Telephone Encounter (Signed)
Message relayed to patient. Verbalized understanding and denied questions.   

## 2015-10-26 ENCOUNTER — Telehealth: Payer: Self-pay

## 2015-10-26 NOTE — Telephone Encounter (Signed)
Message relayed to patient. Verbalized understanding and denied questions.   

## 2015-10-26 NOTE — Telephone Encounter (Signed)
-----   Message from Pieter Partridge, DO sent at 10/26/2015  1:36 AM EDT ----- This lab result is unremarkable.  It is not indicative of susceptibility for increased blood clotting

## 2015-10-30 DIAGNOSIS — I951 Orthostatic hypotension: Secondary | ICD-10-CM | POA: Diagnosis not present

## 2015-10-30 DIAGNOSIS — I495 Sick sinus syndrome: Secondary | ICD-10-CM | POA: Diagnosis not present

## 2015-10-30 DIAGNOSIS — N390 Urinary tract infection, site not specified: Secondary | ICD-10-CM | POA: Diagnosis not present

## 2015-10-30 DIAGNOSIS — B952 Enterococcus as the cause of diseases classified elsewhere: Secondary | ICD-10-CM | POA: Diagnosis not present

## 2015-10-30 DIAGNOSIS — B961 Klebsiella pneumoniae [K. pneumoniae] as the cause of diseases classified elsewhere: Secondary | ICD-10-CM | POA: Diagnosis not present

## 2015-10-30 DIAGNOSIS — E119 Type 2 diabetes mellitus without complications: Secondary | ICD-10-CM | POA: Diagnosis not present

## 2015-11-02 DIAGNOSIS — I495 Sick sinus syndrome: Secondary | ICD-10-CM | POA: Diagnosis not present

## 2015-11-02 DIAGNOSIS — B952 Enterococcus as the cause of diseases classified elsewhere: Secondary | ICD-10-CM | POA: Diagnosis not present

## 2015-11-02 DIAGNOSIS — N390 Urinary tract infection, site not specified: Secondary | ICD-10-CM | POA: Diagnosis not present

## 2015-11-02 DIAGNOSIS — I951 Orthostatic hypotension: Secondary | ICD-10-CM | POA: Diagnosis not present

## 2015-11-02 DIAGNOSIS — B961 Klebsiella pneumoniae [K. pneumoniae] as the cause of diseases classified elsewhere: Secondary | ICD-10-CM | POA: Diagnosis not present

## 2015-11-02 DIAGNOSIS — E119 Type 2 diabetes mellitus without complications: Secondary | ICD-10-CM | POA: Diagnosis not present

## 2015-11-06 DIAGNOSIS — B961 Klebsiella pneumoniae [K. pneumoniae] as the cause of diseases classified elsewhere: Secondary | ICD-10-CM | POA: Diagnosis not present

## 2015-11-06 DIAGNOSIS — I495 Sick sinus syndrome: Secondary | ICD-10-CM | POA: Diagnosis not present

## 2015-11-06 DIAGNOSIS — E119 Type 2 diabetes mellitus without complications: Secondary | ICD-10-CM | POA: Diagnosis not present

## 2015-11-06 DIAGNOSIS — B952 Enterococcus as the cause of diseases classified elsewhere: Secondary | ICD-10-CM | POA: Diagnosis not present

## 2015-11-06 DIAGNOSIS — I951 Orthostatic hypotension: Secondary | ICD-10-CM | POA: Diagnosis not present

## 2015-11-06 DIAGNOSIS — N390 Urinary tract infection, site not specified: Secondary | ICD-10-CM | POA: Diagnosis not present

## 2015-11-08 DIAGNOSIS — I951 Orthostatic hypotension: Secondary | ICD-10-CM | POA: Diagnosis not present

## 2015-11-08 DIAGNOSIS — I495 Sick sinus syndrome: Secondary | ICD-10-CM | POA: Diagnosis not present

## 2015-11-08 DIAGNOSIS — B961 Klebsiella pneumoniae [K. pneumoniae] as the cause of diseases classified elsewhere: Secondary | ICD-10-CM | POA: Diagnosis not present

## 2015-11-08 DIAGNOSIS — E119 Type 2 diabetes mellitus without complications: Secondary | ICD-10-CM | POA: Diagnosis not present

## 2015-11-08 DIAGNOSIS — N390 Urinary tract infection, site not specified: Secondary | ICD-10-CM | POA: Diagnosis not present

## 2015-11-08 DIAGNOSIS — B952 Enterococcus as the cause of diseases classified elsewhere: Secondary | ICD-10-CM | POA: Diagnosis not present

## 2015-11-15 DIAGNOSIS — I639 Cerebral infarction, unspecified: Secondary | ICD-10-CM | POA: Diagnosis not present

## 2015-11-15 DIAGNOSIS — S0093XA Contusion of unspecified part of head, initial encounter: Secondary | ICD-10-CM | POA: Diagnosis not present

## 2015-11-15 DIAGNOSIS — I69398 Other sequelae of cerebral infarction: Secondary | ICD-10-CM | POA: Diagnosis not present

## 2015-11-15 DIAGNOSIS — M79605 Pain in left leg: Secondary | ICD-10-CM | POA: Diagnosis not present

## 2015-11-15 DIAGNOSIS — Z6832 Body mass index (BMI) 32.0-32.9, adult: Secondary | ICD-10-CM | POA: Diagnosis not present

## 2015-11-16 DIAGNOSIS — I495 Sick sinus syndrome: Secondary | ICD-10-CM | POA: Diagnosis not present

## 2015-11-16 DIAGNOSIS — B961 Klebsiella pneumoniae [K. pneumoniae] as the cause of diseases classified elsewhere: Secondary | ICD-10-CM | POA: Diagnosis not present

## 2015-11-16 DIAGNOSIS — B952 Enterococcus as the cause of diseases classified elsewhere: Secondary | ICD-10-CM | POA: Diagnosis not present

## 2015-11-16 DIAGNOSIS — N390 Urinary tract infection, site not specified: Secondary | ICD-10-CM | POA: Diagnosis not present

## 2015-11-16 DIAGNOSIS — E119 Type 2 diabetes mellitus without complications: Secondary | ICD-10-CM | POA: Diagnosis not present

## 2015-11-16 DIAGNOSIS — I951 Orthostatic hypotension: Secondary | ICD-10-CM | POA: Diagnosis not present

## 2015-11-19 DIAGNOSIS — E119 Type 2 diabetes mellitus without complications: Secondary | ICD-10-CM | POA: Diagnosis not present

## 2015-11-19 DIAGNOSIS — I951 Orthostatic hypotension: Secondary | ICD-10-CM | POA: Diagnosis not present

## 2015-11-19 DIAGNOSIS — I495 Sick sinus syndrome: Secondary | ICD-10-CM | POA: Diagnosis not present

## 2015-11-19 DIAGNOSIS — N390 Urinary tract infection, site not specified: Secondary | ICD-10-CM | POA: Diagnosis not present

## 2015-11-19 DIAGNOSIS — B961 Klebsiella pneumoniae [K. pneumoniae] as the cause of diseases classified elsewhere: Secondary | ICD-10-CM | POA: Diagnosis not present

## 2015-11-19 DIAGNOSIS — B952 Enterococcus as the cause of diseases classified elsewhere: Secondary | ICD-10-CM | POA: Diagnosis not present

## 2015-11-20 DIAGNOSIS — R634 Abnormal weight loss: Secondary | ICD-10-CM | POA: Diagnosis not present

## 2015-11-20 DIAGNOSIS — R1013 Epigastric pain: Secondary | ICD-10-CM | POA: Diagnosis not present

## 2015-11-21 DIAGNOSIS — H43813 Vitreous degeneration, bilateral: Secondary | ICD-10-CM | POA: Diagnosis not present

## 2015-11-21 DIAGNOSIS — H26493 Other secondary cataract, bilateral: Secondary | ICD-10-CM | POA: Diagnosis not present

## 2015-11-22 DIAGNOSIS — R1310 Dysphagia, unspecified: Secondary | ICD-10-CM | POA: Diagnosis not present

## 2015-11-22 DIAGNOSIS — R1013 Epigastric pain: Secondary | ICD-10-CM | POA: Diagnosis not present

## 2015-11-22 DIAGNOSIS — F329 Major depressive disorder, single episode, unspecified: Secondary | ICD-10-CM | POA: Diagnosis not present

## 2015-11-22 DIAGNOSIS — Z79899 Other long term (current) drug therapy: Secondary | ICD-10-CM | POA: Diagnosis not present

## 2015-11-22 DIAGNOSIS — Z6832 Body mass index (BMI) 32.0-32.9, adult: Secondary | ICD-10-CM | POA: Diagnosis not present

## 2015-11-22 DIAGNOSIS — K319 Disease of stomach and duodenum, unspecified: Secondary | ICD-10-CM | POA: Diagnosis not present

## 2015-11-22 DIAGNOSIS — R634 Abnormal weight loss: Secondary | ICD-10-CM | POA: Diagnosis not present

## 2015-11-22 DIAGNOSIS — K2950 Unspecified chronic gastritis without bleeding: Secondary | ICD-10-CM | POA: Diagnosis not present

## 2015-11-22 DIAGNOSIS — K222 Esophageal obstruction: Secondary | ICD-10-CM | POA: Diagnosis not present

## 2015-11-22 DIAGNOSIS — K449 Diaphragmatic hernia without obstruction or gangrene: Secondary | ICD-10-CM | POA: Diagnosis not present

## 2015-11-22 DIAGNOSIS — F17210 Nicotine dependence, cigarettes, uncomplicated: Secondary | ICD-10-CM | POA: Diagnosis not present

## 2015-11-22 DIAGNOSIS — J45909 Unspecified asthma, uncomplicated: Secondary | ICD-10-CM | POA: Diagnosis not present

## 2015-11-22 DIAGNOSIS — K228 Other specified diseases of esophagus: Secondary | ICD-10-CM | POA: Diagnosis not present

## 2015-11-22 DIAGNOSIS — E669 Obesity, unspecified: Secondary | ICD-10-CM | POA: Diagnosis not present

## 2015-11-22 DIAGNOSIS — N189 Chronic kidney disease, unspecified: Secondary | ICD-10-CM | POA: Diagnosis not present

## 2015-11-22 DIAGNOSIS — K219 Gastro-esophageal reflux disease without esophagitis: Secondary | ICD-10-CM | POA: Diagnosis not present

## 2015-11-22 DIAGNOSIS — F419 Anxiety disorder, unspecified: Secondary | ICD-10-CM | POA: Diagnosis not present

## 2015-11-22 DIAGNOSIS — Z7984 Long term (current) use of oral hypoglycemic drugs: Secondary | ICD-10-CM | POA: Diagnosis not present

## 2015-11-22 DIAGNOSIS — Z8673 Personal history of transient ischemic attack (TIA), and cerebral infarction without residual deficits: Secondary | ICD-10-CM | POA: Diagnosis not present

## 2015-11-22 DIAGNOSIS — Z9049 Acquired absence of other specified parts of digestive tract: Secondary | ICD-10-CM | POA: Diagnosis not present

## 2015-11-22 DIAGNOSIS — J439 Emphysema, unspecified: Secondary | ICD-10-CM | POA: Diagnosis not present

## 2015-11-22 DIAGNOSIS — E785 Hyperlipidemia, unspecified: Secondary | ICD-10-CM | POA: Diagnosis not present

## 2015-11-22 DIAGNOSIS — E119 Type 2 diabetes mellitus without complications: Secondary | ICD-10-CM | POA: Diagnosis not present

## 2015-11-22 DIAGNOSIS — K760 Fatty (change of) liver, not elsewhere classified: Secondary | ICD-10-CM | POA: Diagnosis not present

## 2015-11-22 DIAGNOSIS — G4733 Obstructive sleep apnea (adult) (pediatric): Secondary | ICD-10-CM | POA: Diagnosis not present

## 2015-11-22 DIAGNOSIS — I129 Hypertensive chronic kidney disease with stage 1 through stage 4 chronic kidney disease, or unspecified chronic kidney disease: Secondary | ICD-10-CM | POA: Diagnosis not present

## 2015-11-23 DIAGNOSIS — B961 Klebsiella pneumoniae [K. pneumoniae] as the cause of diseases classified elsewhere: Secondary | ICD-10-CM | POA: Diagnosis not present

## 2015-11-23 DIAGNOSIS — E119 Type 2 diabetes mellitus without complications: Secondary | ICD-10-CM | POA: Diagnosis not present

## 2015-11-23 DIAGNOSIS — B952 Enterococcus as the cause of diseases classified elsewhere: Secondary | ICD-10-CM | POA: Diagnosis not present

## 2015-11-23 DIAGNOSIS — I495 Sick sinus syndrome: Secondary | ICD-10-CM | POA: Diagnosis not present

## 2015-11-23 DIAGNOSIS — D496 Neoplasm of unspecified behavior of brain: Secondary | ICD-10-CM | POA: Diagnosis not present

## 2015-11-23 DIAGNOSIS — I951 Orthostatic hypotension: Secondary | ICD-10-CM | POA: Diagnosis not present

## 2015-11-23 DIAGNOSIS — N390 Urinary tract infection, site not specified: Secondary | ICD-10-CM | POA: Diagnosis not present

## 2015-11-27 DIAGNOSIS — E782 Mixed hyperlipidemia: Secondary | ICD-10-CM | POA: Diagnosis not present

## 2015-11-27 DIAGNOSIS — J309 Allergic rhinitis, unspecified: Secondary | ICD-10-CM | POA: Diagnosis not present

## 2015-11-27 DIAGNOSIS — E119 Type 2 diabetes mellitus without complications: Secondary | ICD-10-CM | POA: Diagnosis not present

## 2015-11-27 DIAGNOSIS — Z8673 Personal history of transient ischemic attack (TIA), and cerebral infarction without residual deficits: Secondary | ICD-10-CM | POA: Diagnosis not present

## 2015-11-27 DIAGNOSIS — J449 Chronic obstructive pulmonary disease, unspecified: Secondary | ICD-10-CM | POA: Diagnosis not present

## 2015-11-27 DIAGNOSIS — I10 Essential (primary) hypertension: Secondary | ICD-10-CM | POA: Diagnosis not present

## 2015-11-27 DIAGNOSIS — I639 Cerebral infarction, unspecified: Secondary | ICD-10-CM | POA: Diagnosis not present

## 2015-11-27 DIAGNOSIS — Z6832 Body mass index (BMI) 32.0-32.9, adult: Secondary | ICD-10-CM | POA: Diagnosis not present

## 2015-12-07 DIAGNOSIS — E119 Type 2 diabetes mellitus without complications: Secondary | ICD-10-CM | POA: Diagnosis not present

## 2015-12-07 DIAGNOSIS — R531 Weakness: Secondary | ICD-10-CM | POA: Diagnosis not present

## 2015-12-07 DIAGNOSIS — I959 Hypotension, unspecified: Secondary | ICD-10-CM | POA: Diagnosis not present

## 2015-12-11 DIAGNOSIS — I69920 Aphasia following unspecified cerebrovascular disease: Secondary | ICD-10-CM | POA: Diagnosis not present

## 2015-12-11 DIAGNOSIS — C719 Malignant neoplasm of brain, unspecified: Secondary | ICD-10-CM | POA: Diagnosis not present

## 2015-12-11 DIAGNOSIS — E876 Hypokalemia: Secondary | ICD-10-CM | POA: Diagnosis not present

## 2015-12-12 DIAGNOSIS — Z6832 Body mass index (BMI) 32.0-32.9, adult: Secondary | ICD-10-CM | POA: Diagnosis not present

## 2015-12-12 DIAGNOSIS — K219 Gastro-esophageal reflux disease without esophagitis: Secondary | ICD-10-CM | POA: Diagnosis not present

## 2015-12-12 DIAGNOSIS — E876 Hypokalemia: Secondary | ICD-10-CM | POA: Diagnosis not present

## 2015-12-12 DIAGNOSIS — R531 Weakness: Secondary | ICD-10-CM | POA: Diagnosis not present

## 2015-12-24 DIAGNOSIS — J441 Chronic obstructive pulmonary disease with (acute) exacerbation: Secondary | ICD-10-CM | POA: Diagnosis not present

## 2015-12-24 DIAGNOSIS — E876 Hypokalemia: Secondary | ICD-10-CM | POA: Diagnosis not present

## 2015-12-24 DIAGNOSIS — E119 Type 2 diabetes mellitus without complications: Secondary | ICD-10-CM | POA: Diagnosis not present

## 2015-12-24 DIAGNOSIS — I959 Hypotension, unspecified: Secondary | ICD-10-CM | POA: Diagnosis not present

## 2015-12-26 ENCOUNTER — Observation Stay (HOSPITAL_COMMUNITY): Payer: Medicare Other

## 2015-12-26 ENCOUNTER — Observation Stay (HOSPITAL_COMMUNITY)
Admission: EM | Admit: 2015-12-26 | Discharge: 2015-12-28 | Disposition: A | Discharge status: Home / Self Care | Payer: Medicare Other | Attending: Internal Medicine | Admitting: Internal Medicine

## 2015-12-26 ENCOUNTER — Encounter (HOSPITAL_COMMUNITY): Payer: Self-pay

## 2015-12-26 ENCOUNTER — Emergency Department (HOSPITAL_COMMUNITY): Payer: Medicare Other

## 2015-12-26 DIAGNOSIS — E118 Type 2 diabetes mellitus with unspecified complications: Secondary | ICD-10-CM

## 2015-12-26 DIAGNOSIS — R404 Transient alteration of awareness: Secondary | ICD-10-CM | POA: Diagnosis not present

## 2015-12-26 DIAGNOSIS — F17210 Nicotine dependence, cigarettes, uncomplicated: Secondary | ICD-10-CM | POA: Insufficient documentation

## 2015-12-26 DIAGNOSIS — R51 Headache: Secondary | ICD-10-CM | POA: Diagnosis not present

## 2015-12-26 DIAGNOSIS — E119 Type 2 diabetes mellitus without complications: Secondary | ICD-10-CM | POA: Diagnosis not present

## 2015-12-26 DIAGNOSIS — R4182 Altered mental status, unspecified: Secondary | ICD-10-CM | POA: Diagnosis not present

## 2015-12-26 DIAGNOSIS — R531 Weakness: Secondary | ICD-10-CM | POA: Diagnosis not present

## 2015-12-26 DIAGNOSIS — R0602 Shortness of breath: Secondary | ICD-10-CM

## 2015-12-26 DIAGNOSIS — Z7982 Long term (current) use of aspirin: Secondary | ICD-10-CM | POA: Diagnosis not present

## 2015-12-26 DIAGNOSIS — I440 Atrioventricular block, first degree: Secondary | ICD-10-CM | POA: Insufficient documentation

## 2015-12-26 DIAGNOSIS — H539 Unspecified visual disturbance: Secondary | ICD-10-CM | POA: Insufficient documentation

## 2015-12-26 DIAGNOSIS — Z79899 Other long term (current) drug therapy: Secondary | ICD-10-CM | POA: Diagnosis not present

## 2015-12-26 DIAGNOSIS — J449 Chronic obstructive pulmonary disease, unspecified: Secondary | ICD-10-CM | POA: Diagnosis not present

## 2015-12-26 DIAGNOSIS — N179 Acute kidney failure, unspecified: Principal | ICD-10-CM | POA: Diagnosis present

## 2015-12-26 DIAGNOSIS — I4581 Long QT syndrome: Secondary | ICD-10-CM

## 2015-12-26 DIAGNOSIS — Z9181 History of falling: Secondary | ICD-10-CM | POA: Insufficient documentation

## 2015-12-26 DIAGNOSIS — B961 Klebsiella pneumoniae [K. pneumoniae] as the cause of diseases classified elsewhere: Secondary | ICD-10-CM | POA: Insufficient documentation

## 2015-12-26 DIAGNOSIS — R9431 Abnormal electrocardiogram [ECG] [EKG]: Secondary | ICD-10-CM | POA: Diagnosis present

## 2015-12-26 DIAGNOSIS — K219 Gastro-esophageal reflux disease without esophagitis: Secondary | ICD-10-CM | POA: Insufficient documentation

## 2015-12-26 DIAGNOSIS — R55 Syncope and collapse: Secondary | ICD-10-CM | POA: Diagnosis not present

## 2015-12-26 DIAGNOSIS — F419 Anxiety disorder, unspecified: Secondary | ICD-10-CM | POA: Insufficient documentation

## 2015-12-26 DIAGNOSIS — E876 Hypokalemia: Secondary | ICD-10-CM | POA: Diagnosis not present

## 2015-12-26 DIAGNOSIS — I69954 Hemiplegia and hemiparesis following unspecified cerebrovascular disease affecting left non-dominant side: Secondary | ICD-10-CM | POA: Diagnosis not present

## 2015-12-26 DIAGNOSIS — Z791 Long term (current) use of non-steroidal anti-inflammatories (NSAID): Secondary | ICD-10-CM | POA: Insufficient documentation

## 2015-12-26 DIAGNOSIS — R600 Localized edema: Secondary | ICD-10-CM | POA: Diagnosis not present

## 2015-12-26 DIAGNOSIS — E785 Hyperlipidemia, unspecified: Secondary | ICD-10-CM | POA: Diagnosis not present

## 2015-12-26 DIAGNOSIS — M1990 Unspecified osteoarthritis, unspecified site: Secondary | ICD-10-CM | POA: Diagnosis not present

## 2015-12-26 DIAGNOSIS — W19XXXA Unspecified fall, initial encounter: Secondary | ICD-10-CM | POA: Diagnosis not present

## 2015-12-26 DIAGNOSIS — G9340 Encephalopathy, unspecified: Secondary | ICD-10-CM | POA: Insufficient documentation

## 2015-12-26 DIAGNOSIS — J069 Acute upper respiratory infection, unspecified: Secondary | ICD-10-CM | POA: Diagnosis present

## 2015-12-26 DIAGNOSIS — G4733 Obstructive sleep apnea (adult) (pediatric): Secondary | ICD-10-CM | POA: Diagnosis not present

## 2015-12-26 DIAGNOSIS — I69998 Other sequelae following unspecified cerebrovascular disease: Secondary | ICD-10-CM | POA: Insufficient documentation

## 2015-12-26 DIAGNOSIS — E7800 Pure hypercholesterolemia, unspecified: Secondary | ICD-10-CM | POA: Diagnosis not present

## 2015-12-26 DIAGNOSIS — Z7951 Long term (current) use of inhaled steroids: Secondary | ICD-10-CM | POA: Insufficient documentation

## 2015-12-26 DIAGNOSIS — N289 Disorder of kidney and ureter, unspecified: Secondary | ICD-10-CM | POA: Diagnosis not present

## 2015-12-26 DIAGNOSIS — J9811 Atelectasis: Secondary | ICD-10-CM | POA: Diagnosis not present

## 2015-12-26 DIAGNOSIS — I10 Essential (primary) hypertension: Secondary | ICD-10-CM | POA: Diagnosis not present

## 2015-12-26 LAB — COMPREHENSIVE METABOLIC PANEL
ALT: 12 U/L — ABNORMAL LOW (ref 14–54)
AST: 17 U/L (ref 15–41)
Albumin: 4.2 g/dL (ref 3.5–5.0)
Alkaline Phosphatase: 72 U/L (ref 38–126)
Anion gap: 13 (ref 5–15)
BILIRUBIN TOTAL: 1.2 mg/dL (ref 0.3–1.2)
BUN: 29 mg/dL — AB (ref 6–20)
CHLORIDE: 99 mmol/L — AB (ref 101–111)
CO2: 25 mmol/L (ref 22–32)
Calcium: 10.2 mg/dL (ref 8.9–10.3)
Creatinine, Ser: 2.18 mg/dL — ABNORMAL HIGH (ref 0.44–1.00)
GFR calc Af Amer: 26 mL/min — ABNORMAL LOW (ref >60)
GFR, EST NON AFRICAN AMERICAN: 22 mL/min — AB (ref >60)
GLUCOSE: 219 mg/dL — AB (ref 65–99)
POTASSIUM: 3 mmol/L — AB (ref 3.5–5.1)
Sodium: 137 mmol/L (ref 135–145)
TOTAL PROTEIN: 8.3 g/dL — AB (ref 6.5–8.1)

## 2015-12-26 LAB — RAPID URINE DRUG SCREEN, HOSP PERFORMED
AMPHETAMINES: NOT DETECTED
BARBITURATES: NOT DETECTED
Benzodiazepines: POSITIVE — AB
Cocaine: NOT DETECTED
Opiates: NOT DETECTED
TETRAHYDROCANNABINOL: NOT DETECTED

## 2015-12-26 LAB — URINALYSIS, ROUTINE W REFLEX MICROSCOPIC
BILIRUBIN URINE: NEGATIVE
GLUCOSE, UA: NEGATIVE mg/dL
HGB URINE DIPSTICK: NEGATIVE
Ketones, ur: NEGATIVE mg/dL
Leukocytes, UA: NEGATIVE
Nitrite: NEGATIVE
Protein, ur: NEGATIVE mg/dL
SPECIFIC GRAVITY, URINE: 1.017 (ref 1.005–1.030)
pH: 5 (ref 5.0–8.0)

## 2015-12-26 LAB — CBC WITH DIFFERENTIAL/PLATELET
BASOS ABS: 0 10*3/uL (ref 0.0–0.1)
Basophils Relative: 0 %
EOS PCT: 0 %
Eosinophils Absolute: 0 10*3/uL (ref 0.0–0.7)
HEMATOCRIT: 39.5 % (ref 36.0–46.0)
Hemoglobin: 13.8 g/dL (ref 12.0–15.0)
LYMPHS ABS: 0.6 10*3/uL — AB (ref 0.7–4.0)
LYMPHS PCT: 5 %
MCH: 31.1 pg (ref 26.0–34.0)
MCHC: 34.9 g/dL (ref 30.0–36.0)
MCV: 89 fL (ref 78.0–100.0)
MONO ABS: 0.2 10*3/uL (ref 0.1–1.0)
MONOS PCT: 2 %
NEUTROS ABS: 10.5 10*3/uL — AB (ref 1.7–7.7)
Neutrophils Relative %: 93 %
Platelets: 307 10*3/uL (ref 150–400)
RBC: 4.44 MIL/uL (ref 3.87–5.11)
RDW: 12.7 % (ref 11.5–15.5)
WBC: 11.3 10*3/uL — ABNORMAL HIGH (ref 4.0–10.5)

## 2015-12-26 LAB — GLUCOSE, CAPILLARY: GLUCOSE-CAPILLARY: 195 mg/dL — AB (ref 65–99)

## 2015-12-26 LAB — MAGNESIUM: Magnesium: 1.4 mg/dL — ABNORMAL LOW (ref 1.7–2.4)

## 2015-12-26 LAB — BRAIN NATRIURETIC PEPTIDE: B Natriuretic Peptide: 270.2 pg/mL — ABNORMAL HIGH (ref 0.0–100.0)

## 2015-12-26 LAB — TROPONIN I

## 2015-12-26 MED ORDER — ATORVASTATIN CALCIUM 20 MG PO TABS
20.0000 mg | ORAL_TABLET | Freq: Every day | ORAL | Status: DC
  Administered 2015-12-27 – 2015-12-28 (×2): 20 mg via ORAL
  Filled 2015-12-26 (×2): qty 1

## 2015-12-26 MED ORDER — SODIUM CHLORIDE 0.9 % IV SOLN
INTRAVENOUS | Status: DC
  Administered 2015-12-26 – 2015-12-28 (×3): via INTRAVENOUS

## 2015-12-26 MED ORDER — SODIUM CHLORIDE 0.9 % IV SOLN
INTRAVENOUS | Status: DC
  Administered 2015-12-26: 14:00:00 via INTRAVENOUS

## 2015-12-26 MED ORDER — ONDANSETRON HCL 4 MG/2ML IJ SOLN: 4.0000 mg | Freq: Four times a day (QID) | INTRAMUSCULAR | Status: DC | PRN

## 2015-12-26 MED ORDER — POTASSIUM CHLORIDE CRYS ER 20 MEQ PO TBCR: 40.0000 meq | EXTENDED_RELEASE_TABLET | Freq: Once | ORAL | Status: DC

## 2015-12-26 MED ORDER — ACETAMINOPHEN 325 MG PO TABS: 650.0000 mg | ORAL_TABLET | Freq: Four times a day (QID) | ORAL | Status: DC | PRN

## 2015-12-26 MED ORDER — INSULIN ASPART 100 UNIT/ML ~~LOC~~ SOLN: 0.0000 [IU] | Freq: Every day | SUBCUTANEOUS | Status: DC

## 2015-12-26 MED ORDER — CITALOPRAM HYDROBROMIDE 20 MG PO TABS: 20.0000 mg | ORAL_TABLET | Freq: Every day | ORAL | Status: DC

## 2015-12-26 MED ORDER — TOPIRAMATE 25 MG PO TABS
25.0000 mg | ORAL_TABLET | Freq: Every day | ORAL | Status: DC
  Administered 2015-12-26 – 2015-12-27 (×2): 25 mg via ORAL
  Filled 2015-12-26 (×3): qty 1

## 2015-12-26 MED ORDER — FLUTICASONE FUROATE-VILANTEROL 200-25 MCG/INH IN AEPB
1.0000 | INHALATION_SPRAY | Freq: Every day | RESPIRATORY_TRACT | Status: DC
  Administered 2015-12-26 – 2015-12-28 (×3): 1 via RESPIRATORY_TRACT
  Filled 2015-12-26: qty 28

## 2015-12-26 MED ORDER — MOMETASONE FURO-FORMOTEROL FUM 200-5 MCG/ACT IN AERO: 2.0000 | INHALATION_SPRAY | Freq: Two times a day (BID) | RESPIRATORY_TRACT | Status: DC

## 2015-12-26 MED ORDER — ASPIRIN EC 81 MG PO TBEC
81.0000 mg | DELAYED_RELEASE_TABLET | Freq: Every day | ORAL | Status: DC
  Administered 2015-12-27 – 2015-12-28 (×2): 81 mg via ORAL
  Filled 2015-12-26 (×2): qty 1

## 2015-12-26 MED ORDER — SODIUM CHLORIDE 0.9% FLUSH
3.0000 mL | Freq: Two times a day (BID) | INTRAVENOUS | Status: DC
  Administered 2015-12-27: 3 mL via INTRAVENOUS

## 2015-12-26 MED ORDER — ALPRAZOLAM 0.25 MG PO TABS: 0.2500 mg | ORAL_TABLET | Freq: Every day | ORAL | Status: DC | PRN

## 2015-12-26 MED ORDER — ONDANSETRON HCL 4 MG PO TABS
4.0000 mg | ORAL_TABLET | Freq: Four times a day (QID) | ORAL | Status: DC | PRN
  Filled 2015-12-26: qty 1

## 2015-12-26 MED ORDER — POTASSIUM CHLORIDE CRYS ER 20 MEQ PO TBCR
40.0000 meq | EXTENDED_RELEASE_TABLET | ORAL | Status: AC
  Administered 2015-12-26: 40 meq via ORAL
  Filled 2015-12-26: qty 2

## 2015-12-26 MED ORDER — TRAMADOL HCL 50 MG PO TABS
25.0000 mg | ORAL_TABLET | Freq: Four times a day (QID) | ORAL | Status: DC | PRN
  Administered 2015-12-27 – 2015-12-28 (×2): 25 mg via ORAL
  Filled 2015-12-26 (×3): qty 1

## 2015-12-26 MED ORDER — HEPARIN SODIUM (PORCINE) 5000 UNIT/ML IJ SOLN
5000.0000 [IU] | Freq: Three times a day (TID) | INTRAMUSCULAR | Status: DC
  Administered 2015-12-26 – 2015-12-28 (×5): 5000 [IU] via SUBCUTANEOUS
  Filled 2015-12-26 (×5): qty 1

## 2015-12-26 MED ORDER — INSULIN ASPART 100 UNIT/ML ~~LOC~~ SOLN
0.0000 [IU] | Freq: Three times a day (TID) | SUBCUTANEOUS | Status: DC
  Administered 2015-12-27: 2 [IU] via SUBCUTANEOUS
  Administered 2015-12-27: 3 [IU] via SUBCUTANEOUS

## 2015-12-26 MED ORDER — ACETAMINOPHEN 650 MG RE SUPP: 650.0000 mg | Freq: Four times a day (QID) | RECTAL | Status: DC | PRN

## 2015-12-26 MED ORDER — ALBUTEROL SULFATE (2.5 MG/3ML) 0.083% IN NEBU
2.5000 mg | INHALATION_SOLUTION | Freq: Four times a day (QID) | RESPIRATORY_TRACT | Status: DC | PRN
Start: 2015-12-26 — End: 2015-12-28

## 2015-12-26 MED ORDER — PANTOPRAZOLE SODIUM 40 MG PO TBEC
40.0000 mg | DELAYED_RELEASE_TABLET | Freq: Every day | ORAL | Status: DC
  Administered 2015-12-27 – 2015-12-28 (×2): 40 mg via ORAL
  Filled 2015-12-26 (×2): qty 1

## 2015-12-26 NOTE — H&P (Signed)
[facility] History and Physical   Patient: Kristi Kramer IPJ:[id number]   PCP: Fae Pippin DOB: 13-Oct-1945   DOA: 12/26/2015   DOS: 12/26/2015   DOS: the patient was seen and examined on 12/26/2015  Patient coming from: The patient is coming from home  Chief Complaint: Confusion  HPI: Kristi Kramer is a 70 y.o. 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 The patient was brought to ER by EMS. Daughter was not able to wake up the patient at all. Per EMS patient did not loss any pulse. No fall reported. Patient remembers having breakfast but after that she generally goes to sleep on a regular basis. Patient's medications were adjusted yesterday but the family does not know which one. No other recent change in medications reported by patient. Patient had 6 falls in last 3 weeks. I'll describes as having dizziness with loss of balance. Patient complains of chronic left hip pain for which she takes tramadol as well as ibuprofen almost on daily basis. No focal deficit which is new. No dizziness no lightheadedness which is new. Patient denies any nausea or vomiting or diarrhea. Patient reports decrease oral intake. Per patient's daughter she has facial swelling every morning.  ED Course:  With confusion and recurrent fall CT head was done which was negative for any acute abnormality. Chest x-ray was also negative. Patient was referred for admission for AKI  At her baseline ambulates with support And is independent for most of her ADL; manages her medication on her own.  Review of Systems: as mentioned in the history of present illness.  A comprehensive review of the other systems is negative.  Past Medical History  Diagnosis Date  . Diabetes mellitus   .  Hypertension   . High cholesterol   . Stroke ([place])     x2 12, 16  . Sleep apnea     cpap  . COPD (chronic obstructive pulmonary disease) (HCC)     sarcoidosis  . Shortness of breath dyspnea   . Anxiety   . GERD (gastroesophageal reflux disease)   . Arthritis    Past Surgical History  Procedure Laterality Date  . Neck surgery    . Back surgery    . Cholecystectomy    . Abdominal hysterectomy    . Ear examination under anesthesia Left     plate   Social History:  reports that she has been smoking Cigarettes.  She has a 5.5 pack-year smoking history. She has never used smokeless tobacco. She reports that she does not drink alcohol or use illicit drugs.  Allergies  Allergen Reactions  . Mometasone Anaphylaxis    Turns Red  . Codeine Nausea And Vomiting  . Sulfa Antibiotics Nausea And Vomiting     Family History  Problem Relation Age of Onset  . Liver cancer Father      Prior to Admission medications   Medication Sig Start Date End Date Taking? Authorizing Provider  ADVAIR DISKUS 250-50 MCG/DOSE AEPB Inhale 1 puff into the lungs 2 (two) times daily. 11/27/15  Yes Historical Provider, MD  albuterol (PROVENTIL) (2.5 MG/3ML) 0.083% nebulizer solution Take 2.5 mg by nebulization 3 (three) times daily as needed for wheezing or shortness of breath.   Yes Historical Provider, MD  ALPRAZolam Duanne Moron) 0.5 MG tablet Take 0.5-1 mg by mouth daily as  needed. For anxiety.   Yes Historical Provider, MD  aspirin EC 81 MG tablet Take 81 mg by mouth daily.    Yes Historical Provider, MD  atorvastatin (LIPITOR) 20 MG tablet Take 20 mg by mouth daily.  06/02/15  Yes Historical Provider, MD  azelastine (ASTELIN) 0.1 % nasal spray Place 1 spray into both nostrils 2 (two) times daily. Use in each nostril as directed   Yes Historical Provider, MD  citalopram (CELEXA) 20 MG tablet Take 20 mg by mouth daily.   Yes Historical Provider, MD  esomeprazole (NEXIUM) 20 MG capsule Take 20 mg by mouth daily at  12 noon.   Yes Historical Provider, MD  ibuprofen (ADVIL,MOTRIN) 200 MG tablet Take 400 mg by mouth every 6 (six) hours as needed for headache or moderate pain. Reported on 09/20/2015   Yes Historical Provider, MD  loratadine (CLARITIN) 10 MG tablet Take 10 mg by mouth daily.    Yes Historical Provider, MD  metFORMIN (GLUCOPHAGE) 1000 MG tablet Take 1,000 mg by mouth 2 (two) times daily with a meal.  06/02/15  Yes Historical Provider, MD  pantoprazole (PROTONIX) 40 MG tablet Take 40 mg by mouth daily.   Yes Historical Provider, MD  promethazine (PHENERGAN) 25 MG tablet Take 25 mg by mouth every 6 (six) hours as needed. n/v 11/22/15  Yes Historical Provider, MD  sitaGLIPtin (JANUVIA) 100 MG tablet Take 100 mg by mouth daily.   Yes Historical Provider, MD  topiramate (TOPAMAX) 25 MG tablet Take 1 tablet (25 mg total) by mouth at bedtime. 10/18/15  Yes Adam Telford Nab, DO  traMADol (ULTRAM) 50 MG tablet Take 50 mg by mouth every 6 (six) hours as needed for moderate pain.   Yes Historical Provider, MD  VENTOLIN HFA 108 (90 BASE) MCG/ACT inhaler Inhale 2 puffs into the lungs every 6 (six) hours as needed. wheezing 04/10/15  Yes Historical Provider, MD    Physical Exam: Filed Vitals:   12/26/15 1401 12/26/15 1638 12/26/15 1730 12/26/15 1800  BP:  115/60 132/69 126/68  Pulse:  79 87 79  Temp:      TempSrc:      Resp:  '20 24 20  '$ SpO2: 93% 97% 96% 97%    General: Alert, Awake and Oriented to Time, Place and Person. Appear in mild distress Eyes: PERRL, Conjunctiva normal ENT: Oral Mucosa clear dry. Neck: difficult to assess JVD, no Abnormal Mass Or lumps Cardiovascular: S1 and S2 Present, no Murmur, Peripheral Pulses Present Respiratory: Bilateral Air entry equal and Decreased, Clear to Auscultation, no Crackles, no wheezes Abdomen: Bowel Sound present, Soft and no tenderness Skin: no redness, no Rash  Extremities: no Pedal edema, no calf tenderness Neurologic: Grossly no focal neuro deficit.  Bilaterally Equal motor strength Mental status AAOx3, speech normal, attention normal,  Cranial Nerves PERRL, EOM normal and present, Motor strength bilateral equal strength 4/5,  Sensation present to light touch,  Reflexes difficult to elicit knee and biceps, babinski equivocal,  Cerebellar test normal finger nose finger. No asterixis  Labs on Admission:  CBC:  Recent Labs Lab 12/26/15 1414  WBC 11.3*  NEUTROABS 10.5*  HGB 13.8  HCT 39.5  MCV 89.0  PLT 161   Basic Metabolic Panel:  Recent Labs Lab 12/26/15 1414  NA 137  K 3.0*  CL 99*  CO2 25  GLUCOSE 219*  BUN 29*  CREATININE 2.18*  CALCIUM 10.2   GFR: CrCl cannot be calculated (Unknown ideal weight.). Liver Function Tests:  Recent Labs Lab  12/26/15 1414  AST 17  ALT 12*  ALKPHOS 72  BILITOT 1.2  PROT 8.3*  ALBUMIN 4.2   No results for input(s): LIPASE, AMYLASE in the last 168 hours. No results for input(s): AMMONIA in the last 168 hours. Coagulation Profile: No results for input(s): INR, PROTIME in the last 168 hours. Cardiac Enzymes:  Recent Labs Lab 12/26/15 1414  TROPONINI <0.03   BNP (last 3 results) No results for input(s): PROBNP in the last 8760 hours. HbA1C: No results for input(s): HGBA1C in the last 72 hours. CBG: No results for input(s): GLUCAP in the last 168 hours. Lipid Profile: No results for input(s): CHOL, HDL, LDLCALC, TRIG, CHOLHDL, LDLDIRECT in the last 72 hours. Thyroid Function Tests: No results for input(s): TSH, T4TOTAL, FREET4, T3FREE, THYROIDAB in the last 72 hours. Anemia Panel: No results for input(s): VITAMINB12, FOLATE, FERRITIN, TIBC, IRON, RETICCTPCT in the last 72 hours. Urine analysis:    Component Value Date/Time   COLORURINE YELLOW 12/26/2015 1746   APPEARANCEUR CLOUDY* 12/26/2015 1746   LABSPEC 1.017 12/26/2015 1746   PHURINE 5.0 12/26/2015 1746   GLUCOSEU NEGATIVE 12/26/2015 1746   HGBUR NEGATIVE 12/26/2015 1746   BILIRUBINUR NEGATIVE 12/26/2015  1746   KETONESUR NEGATIVE 12/26/2015 1746   PROTEINUR NEGATIVE 12/26/2015 1746   UROBILINOGEN 0.2 05/31/2015 1420   NITRITE NEGATIVE 12/26/2015 1746   LEUKOCYTESUR NEGATIVE 12/26/2015 1746    Radiological Exams on Admission: Dg Chest 2 View  12/26/2015  CLINICAL DATA:  Shortness of breath and productive cough with intermittent fever for 3 weeks. EXAM: CHEST  2 VIEW COMPARISON:  Single-view of the chest 09/25/2015 and 05/29/2015. CT chest 07/31/2015. FINDINGS: Mild subsegmental atelectasis is seen in the periphery of the left lung base. The lungs are otherwise clear. No pneumothorax or pleural effusion. Heart size is normal. IMPRESSION: Mild subsegmental atelectasis left lung base.  Otherwise negative. Electronically Signed   By: Inge Rise M.D.   On: 12/26/2015 15:01   Ct Head Wo Contrast  12/26/2015  CLINICAL DATA:  Altered mental status today.  Posterior headache. EXAM: CT HEAD WITHOUT CONTRAST TECHNIQUE: Contiguous axial images were obtained from the base of the skull through the vertex without intravenous contrast. COMPARISON:  09/25/2015 FINDINGS: There is no evidence of intracranial hemorrhage, brain edema, or other signs of acute infarction. There is no evidence of intracranial mass lesion or mass effect. No abnormal extraaxial fluid collections are identified. Mild chronic small vessel disease is again demonstrated. An old small high right parietal lobe cortical infarct is stable in appearance. No evidence hydrocephalus. No skull abnormality identified. Opacification of left sphenoid sinus and ethmoid sinuses has increased since previous study. Prior left mastoidectomy again noted. IMPRESSION: No acute intracranial abnormality. Stable chronic small vessel disease and old high right parietal lobe infarct. Increased left sphenoid and ethmoid sinus disease compared to prior study. Electronically Signed   By: Earle Gell M.D.   On: 12/26/2015 17:16   EKG: Independently reviewed. normal sinus  rhythm, nonspecific ST and T waves changes, prolonged QT interval.  Assessment/Plan 1. AKI (acute kidney injury) [facility]) Patient presents with recurrent fall, dizziness, confusion. Found to have acute kidney injury. Next and probable etiology is dehydration. Since the patient appears to be clinically dehydrated. I would continue with IV hydration. Next and monitor renal function in the morning. Check ultrasound renal. Also check urine microscopic.  2. Recurrent fall. Probable syncopal episode. Confusion versus acute encephalopathy. Patient presented with confusion as a primary complaint. Her acute encephalopathy likely appears  to be multifactorial from polypharmacy due to worsening renal function. Also possibility of sleep apnea contributing to it cannot be ruled out. No focal deficit identified on examination. At present we will continue to monitor the patient on telemetry. Serial neuro checks. Physical therapy consult. Orthostatic vitals in the morning.  3. Hypokalemia. Prolonged QT interval on EKG. EKG shows prolonged QT interval of 524. Discontinuing Celexa at present. Replacing potassium aggressively. Monitoring on telemetry. Recheck potassium in the morning.  4. Essential hypertension. Blood pressure stable. We'll continue to monitor.  5. Recurrent CVA. Patient has been following up with neurology and had an extensive workup as an outpatient. Next on the present does not appear to be having a new CVA.  symptoms ongoing since long and deficit is chronic. Continue aspirin., Lipitor  6. Type 2 diabetes mellitus. Check hemoglobin A1c tomorrow Placing the patient on moderate sliding scale. Holding metformin as well as Januvia.  7. Obstructive sleep apnea. Continue C Pap daily at bedtime.  Nutrition: Carb modified diet DVT Prophylaxis: subcutaneous Heparin  Advance goals of care discussion: Full code, patient would like to discuss with son and family prior to making  ultimate decision   Consults: None  Family Communication: family was present at bedside, at the time of interview.  Opportunity was given to ask question and all questions were answered satisfactorily.  Disposition: Admitted as observation, telemetry unit. Likely to be discharged home, in 2 days, likely related home health physical therapy consulted.  Author: Berle Mull, MD [facility] Pager: [phone] 12/26/2015  If 7PM-7AM, please contact night-coverage [email or web address] Password [email or web address]

## 2015-12-26 NOTE — ED Notes (Signed)
Bed: VH46 Expected date:  Expected time:  Means of arrival:  Comments: EMS 68 - possible pneumonia

## 2015-12-26 NOTE — ED Provider Notes (Signed)
CSN: [id number]     Arrival date & time 12/26/15  1328 History   First MD Initiated Contact with Patient 12/26/15 1332     Chief Complaint  Patient presents with  . URI     (Consider location/radiation/quality/duration/timing/severity/associated sxs/prior Treatment) HPI Comments: Patient here complaining of increased cough and shortness of breath without fever 3 weeks. Does have a history of COPD inside physician yesterday for similar symptoms and given a nasal spray. Today she was difficult to arouse and a fellow resident of her facility called EMS. She does have a prior history of CVA with resultant left upper and left lower extremity weakness which she says is unchanged today. Denies any fever, vomiting today. No diarrhea. Was told that she may have a remote history of CHF. Denies any anginal type chest pain. EMS called and patient transported here  Patient is a 70 y.o. female presenting with URI. The history is provided by the patient.  URI   Past Medical History  Diagnosis Date  . Diabetes mellitus   . Hypertension   . High cholesterol   . Stroke ([place])     x2 12, 16  . Sleep apnea     cpap  . COPD (chronic obstructive pulmonary disease) (HCC)     sarcoidosis  . Shortness of breath dyspnea   . Anxiety   . GERD (gastroesophageal reflux disease)   . Arthritis    Past Surgical History  Procedure Laterality Date  . Neck surgery    . Back surgery    . Cholecystectomy    . Abdominal hysterectomy    . Ear examination under anesthesia Left     plate   Family History  Problem Relation Age of Onset  . Liver cancer Father    Social History  Substance Use Topics  . Smoking status: Light Tobacco Smoker -- 0.25 packs/day for 22 years    Types: Cigarettes  . Smokeless tobacco: Never Used     Comment: wants patches to quit  . Alcohol Use: No   OB History    No data available     Review of Systems  All other systems reviewed and are negative.     Allergies   Mometasone; Codeine; and Sulfa antibiotics  Home Medications   Prior to Admission medications   Medication Sig Start Date End Date Taking? Authorizing Provider  albuterol (PROVENTIL) (2.5 MG/3ML) 0.083% nebulizer solution Take 2.5 mg by nebulization 3 (three) times daily as needed for wheezing or shortness of breath.    Historical Provider, MD  ALPRAZolam Duanne Moron) 0.5 MG tablet Take 0.5-1 mg by mouth daily as needed. For anxiety.    Historical Provider, MD  aspirin EC 81 MG tablet Take 81 mg by mouth daily.     Historical Provider, MD  atorvastatin (LIPITOR) 20 MG tablet Take 40 mg by mouth daily.  06/02/15   Historical Provider, MD  citalopram (CELEXA) 20 MG tablet Take 20 mg by mouth daily.    Historical Provider, MD  dexamethasone (DECADRON) 4 MG tablet Take 1-3 tablets (4-12 mg total) by mouth every 8 (eight) hours. Take 1 tablet('4mg'$ ) 3 times daily x 2 days, then 1 tablet('4mg'$ ) 2 times daily x 2 days, then 1 tablet('4mg'$ ) daily x 2 days then stop. 09/28/15   Eugenie Filler, MD  DEXILANT 60 MG capsule Take 60 mg by mouth daily as needed (for heartburn).  04/23/15   Historical Provider, MD  glipiZIDE (GLUCOTROL XL) 10 MG 24 hr tablet Take 10 mg  by mouth 2 (two) times daily with breakfast and lunch. 06/29/15   Historical Provider, MD  ibuprofen (ADVIL,MOTRIN) 200 MG tablet Take 600 mg by mouth every 6 (six) hours as needed for moderate pain. Reported on 09/20/2015    Historical Provider, MD  loratadine (CLARITIN) 10 MG tablet Take 10 mg by mouth daily as needed for allergies or rhinitis.     Historical Provider, MD  losartan (COZAAR) 100 MG tablet Take 100 mg by mouth daily.    Historical Provider, MD  metFORMIN (GLUCOPHAGE) 1000 MG tablet Take 1,000 mg by mouth 2 (two) times daily with a meal.  06/02/15   Historical Provider, MD  pioglitazone (ACTOS) 30 MG tablet Take 30 mg by mouth daily.    Historical Provider, MD  promethazine (PHENERGAN) 12.5 MG tablet Take 12.5 mg by mouth 3 (three) times daily  as needed for nausea or vomiting.    Historical Provider, MD  sitaGLIPtin (JANUVIA) 100 MG tablet Take 100 mg by mouth daily.    Historical Provider, MD  topiramate (TOPAMAX) 25 MG tablet Take 1 tablet (25 mg total) by mouth at bedtime. 10/18/15   Pieter Partridge, DO  VENTOLIN HFA 108 (90 BASE) MCG/ACT inhaler Inhale 2 puffs into the lungs every 6 (six) hours as needed. wheezing 04/10/15   Historical Provider, MD   BP 137/65 mmHg  Pulse 81  Temp(Src) 97.7 F (36.5 C) (Oral)  Resp 20  SpO2 94% Physical Exam  Constitutional: She is oriented to person, place, and time. She appears well-developed and well-nourished.  Non-toxic appearance. No distress.  HENT:  Head: Normocephalic and atraumatic.  Eyes: Conjunctivae, EOM and lids are normal. Pupils are equal, round, and reactive to light.  Neck: Normal range of motion. Neck supple. No tracheal deviation present. No thyroid mass present.  Cardiovascular: Normal rate, regular rhythm and normal heart sounds.  Exam reveals no gallop.   No murmur heard. Pulmonary/Chest: Effort normal. No stridor. No respiratory distress. She has decreased breath sounds. She has no wheezes. She has rhonchi. She has no rales.  Abdominal: Soft. Normal appearance and bowel sounds are normal. She exhibits no distension. There is no tenderness. There is no rebound and no CVA tenderness.  Musculoskeletal: Normal range of motion. She exhibits no edema or tenderness.  Neurological: She is alert and oriented to person, place, and time. She has normal strength. No cranial nerve deficit or sensory deficit. GCS eye subscore is 4. GCS verbal subscore is 5. GCS motor subscore is 6.  Skin: Skin is warm and dry. No abrasion and no rash noted.  Psychiatric: She has a normal mood and affect. Her speech is normal and behavior is normal.  Nursing note and vitals reviewed.   ED Course  Procedures (including critical care time) Labs Review Labs Reviewed  BRAIN NATRIURETIC PEPTIDE   TROPONIN I  CBC WITH DIFFERENTIAL/PLATELET  COMPREHENSIVE METABOLIC PANEL    Imaging Review No results found. I have personally reviewed and evaluated these images and lab results as part of my medical decision-making.   EKG Interpretation None      MDM   Final diagnoses:  SOB (shortness of breath)    Patient here with evidence of acute kidney injury. Head CT was negative. Will admit for further management    Lacretia Leigh, MD 12/26/15 1749

## 2015-12-26 NOTE — ED Notes (Signed)
18:48  Pt can go to floor .

## 2015-12-26 NOTE — ED Notes (Addendum)
Patient transported by [facility] from home.  Patient c/o SHOB, cough productive of green sputum, intermittent fever x 3wks.  Patient states she was seen by PMD 1day ago and was started on medications - patient unable to recall.  Patient states her friend called EMS because she was difficult to wake up today.  Patient awake, alert, responding appropriately during assessment.    Significant other at bedside states patient has been sick with URI x 3-4 wks, with symptoms increasing over the last few days.  States patient has been very sleepy with decreased PO intake the last two days.

## 2015-12-27 DIAGNOSIS — N179 Acute kidney failure, unspecified: Secondary | ICD-10-CM | POA: Diagnosis not present

## 2015-12-27 DIAGNOSIS — I10 Essential (primary) hypertension: Secondary | ICD-10-CM | POA: Diagnosis not present

## 2015-12-27 DIAGNOSIS — G4733 Obstructive sleep apnea (adult) (pediatric): Secondary | ICD-10-CM | POA: Diagnosis not present

## 2015-12-27 DIAGNOSIS — E785 Hyperlipidemia, unspecified: Secondary | ICD-10-CM | POA: Diagnosis not present

## 2015-12-27 LAB — COMPREHENSIVE METABOLIC PANEL
ALBUMIN: 3.5 g/dL (ref 3.5–5.0)
ALT: 11 U/L — ABNORMAL LOW (ref 14–54)
ANION GAP: 10 (ref 5–15)
AST: 12 U/L — AB (ref 15–41)
Alkaline Phosphatase: 55 U/L (ref 38–126)
BUN: 32 mg/dL — AB (ref 6–20)
CHLORIDE: 101 mmol/L (ref 101–111)
CO2: 27 mmol/L (ref 22–32)
Calcium: 9.5 mg/dL (ref 8.9–10.3)
Creatinine, Ser: 1.84 mg/dL — ABNORMAL HIGH (ref 0.44–1.00)
GFR calc Af Amer: 32 mL/min — ABNORMAL LOW (ref >60)
GFR calc non Af Amer: 27 mL/min — ABNORMAL LOW (ref >60)
GLUCOSE: 126 mg/dL — AB (ref 65–99)
POTASSIUM: 3.3 mmol/L — AB (ref 3.5–5.1)
SODIUM: 138 mmol/L (ref 135–145)
Total Bilirubin: 0.9 mg/dL (ref 0.3–1.2)
Total Protein: 6.6 g/dL (ref 6.5–8.1)

## 2015-12-27 LAB — URINALYSIS W MICROSCOPIC (NOT AT ARMC)
BILIRUBIN URINE: NEGATIVE
GLUCOSE, UA: 100 mg/dL — AB
HGB URINE DIPSTICK: NEGATIVE
Ketones, ur: NEGATIVE mg/dL
Leukocytes, UA: NEGATIVE
NITRITE: NEGATIVE
Protein, ur: NEGATIVE mg/dL
SPECIFIC GRAVITY, URINE: 1.012 (ref 1.005–1.030)
pH: 6 (ref 5.0–8.0)

## 2015-12-27 LAB — BASIC METABOLIC PANEL
ANION GAP: 9 (ref 5–15)
BUN: 29 mg/dL — ABNORMAL HIGH (ref 6–20)
CALCIUM: 9.3 mg/dL (ref 8.9–10.3)
CO2: 27 mmol/L (ref 22–32)
Chloride: 100 mmol/L — ABNORMAL LOW (ref 101–111)
Creatinine, Ser: 1.52 mg/dL — ABNORMAL HIGH (ref 0.44–1.00)
GFR, EST AFRICAN AMERICAN: 40 mL/min — AB (ref >60)
GFR, EST NON AFRICAN AMERICAN: 34 mL/min — AB (ref >60)
GLUCOSE: 143 mg/dL — AB (ref 65–99)
POTASSIUM: 3.3 mmol/L — AB (ref 3.5–5.1)
Sodium: 136 mmol/L (ref 135–145)

## 2015-12-27 LAB — CBC
HEMATOCRIT: 33 % — AB (ref 36.0–46.0)
Hemoglobin: 11.5 g/dL — ABNORMAL LOW (ref 12.0–15.0)
MCH: 30.3 pg (ref 26.0–34.0)
MCHC: 34.8 g/dL (ref 30.0–36.0)
MCV: 87.1 fL (ref 78.0–100.0)
Platelets: 266 10*3/uL (ref 150–400)
RBC: 3.79 MIL/uL — ABNORMAL LOW (ref 3.87–5.11)
RDW: 12.6 % (ref 11.5–15.5)
WBC: 9.3 10*3/uL (ref 4.0–10.5)

## 2015-12-27 LAB — MAGNESIUM
Magnesium: 1.4 mg/dL — ABNORMAL LOW (ref 1.7–2.4)
Magnesium: 2 mg/dL (ref 1.7–2.4)

## 2015-12-27 LAB — GLUCOSE, CAPILLARY
GLUCOSE-CAPILLARY: 153 mg/dL — AB (ref 65–99)
GLUCOSE-CAPILLARY: 122 mg/dL — AB (ref 65–99)
Glucose-Capillary: 107 mg/dL — ABNORMAL HIGH (ref 65–99)
Glucose-Capillary: 135 mg/dL — ABNORMAL HIGH (ref 65–99)

## 2015-12-27 MED ORDER — MAGNESIUM SULFATE 2 GM/50ML IV SOLN
2.0000 g | Freq: Once | INTRAVENOUS | Status: AC
  Administered 2015-12-27: 2 g via INTRAVENOUS
  Filled 2015-12-27: qty 50

## 2015-12-27 MED ORDER — ENSURE ENLIVE PO LIQD
237.0000 mL | Freq: Two times a day (BID) | ORAL | Status: DC
  Administered 2015-12-27 – 2015-12-28 (×2): 237 mL via ORAL

## 2015-12-27 MED ORDER — POTASSIUM CHLORIDE CRYS ER 20 MEQ PO TBCR
40.0000 meq | EXTENDED_RELEASE_TABLET | ORAL | Status: AC
  Administered 2015-12-27 (×2): 40 meq via ORAL
  Filled 2015-12-27 (×2): qty 2

## 2015-12-27 NOTE — Care Management Note (Signed)
Case Management Note  Patient Details  Name: Kristi Kramer MRN: [id number] Date of Birth: November 11, 1945  Subjective/Objective: PT-recc HHPT. Patient chose [facility]-rep [facility] Glad aware & following for [facility] orders. MD notified of HHPT order needed.                   Action/Plan:d/c plan home w/HHC.  Expected Discharge Date:   (unknown)               Expected Discharge Plan:  [place]  In-House Referral:     Discharge planning Services  CM Consult  Post Acute Care Choice:    Choice offered to:  Patient  DME Arranged:    DME Agency:     HH Arranged:    [place] Agency:  [facility]  Status of Service:  In process, will continue to follow  Medicare Important Message Given:    Date Medicare IM Given:    Medicare IM give by:    Date Additional Medicare IM Given:    Additional Medicare Important Message give by:     If discussed at [place] of Stay Meetings, dates discussed:    Additional Comments:  Dessa Phi, RN 12/27/2015, 1:36 PM

## 2015-12-27 NOTE — Evaluation (Signed)
Physical Therapy Evaluation Patient Details Name: Kristi Kramer MRN: [id number] DOB: 10/10/1945 Today's Date: 12/27/2015   History of Present Illness  70 y.o. female with past medical history of CVA with residual left-sided weakness as well as vision deficit, essential hypertension, type 2 diabetes mellitus, history of COPD-sarcoidosis, obstructive sleep apnea on C Pap, dyslipidemia, essential hypertension, GERD and admitted acute kidney injury. Pt presents with recurrent fall, dizziness, confusion  Clinical Impression  Pt admitted with above diagnosis. Pt currently with functional limitations due to the deficits listed below (see PT Problem List).  Pt will benefit from skilled PT to increase their independence and safety with mobility to allow discharge to the venue listed below.   Pt assisted with ambulating in hallway.  Pt reports falls at home due to dizziness however none reported with mobility today.     Follow Up Recommendations Home health PT;Supervision for mobility/OOB    Equipment Recommendations  None recommended by PT    Recommendations for Other Services       Precautions / Restrictions Precautions Precautions: Fall      Mobility  Bed Mobility Overal bed mobility: Needs Assistance Bed Mobility: Supine to Sit     Supine to sit: Min guard        Transfers Overall transfer level: Needs assistance Equipment used: Rolling walker (2 wheeled) Transfers: Sit to/from [facility] Sit to Stand: Min guard Stand pivot transfers: Min guard       General transfer comment: verbal cues for safe technique  Ambulation/Gait Ambulation/Gait assistance: Min guard Ambulation Distance (Feet): 160 Feet Assistive device: Rolling walker (2 wheeled) Gait Pattern/deviations: Step-through pattern;Decreased stride length     General Gait Details: steady with RW, denies dizziness, vitals upon sitting: 142/64 mmHg, 98% room air, 85 bpm  Stairs             Wheelchair Mobility    Modified Rankin (Stroke Patients Only)       Balance                                             Pertinent Vitals/Pain Pain Assessment: No/denies pain    Home Living Family/patient expects to be discharged to:: Private residence Living Arrangements: Non-relatives/Friends Available Help at Discharge: Available 24 hours/day;Friend(s)   Home Access: Stairs to enter   [employer] of Steps: 2 Home Layout: One level Home Equipment: Environmental consultant - 2 wheels;Cane - single point;Toilet riser      Prior Function Level of Independence: Needs assistance   Gait / Transfers Assistance Needed: uses RW           Hand Dominance        Extremity/Trunk Assessment               Lower Extremity Assessment: Generalized weakness         Communication   Communication: No difficulties  Cognition Arousal/Alertness: Awake/alert Behavior During Therapy: WFL for tasks assessed/performed Overall Cognitive Status: Within Functional Limits for tasks assessed                      General Comments      Exercises        Assessment/Plan    PT Assessment Patient needs continued PT services  PT Diagnosis Difficulty walking;Generalized weakness   PT Problem List Decreased strength;Decreased balance;Decreased activity tolerance;Decreased mobility;Decreased knowledge of use of  DME  PT Treatment Interventions DME instruction;Gait training;Functional mobility training;Patient/family education;Therapeutic activities;Therapeutic exercise   PT Goals (Current goals can be found in the Care Plan section) Acute Rehab PT Goals PT Goal Formulation: With patient Time For Goal Achievement: 01/03/16 Potential to Achieve Goals: Good    Frequency Min 3X/week   Barriers to discharge        Co-evaluation               End of Session Equipment Utilized During Treatment: Gait belt Activity Tolerance: Patient tolerated  treatment well Patient left: with call bell/phone within reach;in chair;with chair alarm set      Functional Assessment Tool Used: clinical judgement Functional Limitation: Mobility: Walking and moving around Mobility: Walking and Moving Around Current Status [phone]): At least 1 percent but less than 20 percent impaired, limited or restricted Mobility: Walking and Moving Around Goal Status [phone]): At least 1 percent but less than 20 percent impaired, limited or restricted    Time: 0923-0943 PT Time Calculation (min) (ACUTE ONLY): 20 min   Charges:   PT Evaluation $PT Eval Moderate Complexity: 1 Procedure     PT G Codes:   PT G-Codes **NOT FOR INPATIENT CLASS** Functional Assessment Tool Used: clinical judgement Functional Limitation: Mobility: Walking and moving around Mobility: Walking and Moving Around Current Status ([id number]): At least 1 percent but less than 20 percent impaired, limited or restricted Mobility: Walking and Moving Around Goal Status [phone]): At least 1 percent but less than 20 percent impaired, limited or restricted    Jihaad Bruschi,KATHrine E 12/27/2015, 12:53 PM Carmelia Bake, PT, DPT 12/27/2015 Pager: [phone]

## 2015-12-27 NOTE — Progress Notes (Signed)
Initial Nutrition Assessment  DOCUMENTATION CODES:   Severe malnutrition in context of chronic illness  INTERVENTION:  -Ensure Enlive po BID, each supplement provides 350 kcal and 20 grams of protein -RD to continue to monitor  NUTRITION DIAGNOSIS:   Malnutrition related to chronic illness (Aging) as evidenced by percent weight loss, energy intake < or equal to 50% for > or equal to 1 month.  GOAL:   Patient will meet greater than or equal to 90% of their needs  MONITOR:   PO intake, I & O's, Labs, Supplement acceptance, Skin  REASON FOR ASSESSMENT:   Malnutrition Screening Tool    ASSESSMENT:   Kristi Kramer is a 70 y.o. 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  Spoke with Kristi Kramer at bedside who endorses ok PO intake, but states that she just doesn't get hungry. This is of course, with a PMH of CVA that likely contributes. She states at home she will eat: Breakfast - Fried egg with toast Lunch - Half a PB&J Sandwich Dinner - A small amount of a TV dinner  Likely not meeting her needs  Her wt history is messy, currently exhibits a 23#/11% severe wt loss in 3 months - Possibly related to dehydration  - pt admitted with AKI.  She was eating a tray during my visit that she had consumed approximately half of, but stated she probably wouldn't eat much more. Felt like it was a lot of food.  Nutrition-Focused physical exam completed. Findings are moderate fat depletion, no muscle depletion, and no edema.   Labs and medications reviewed: CBG 107-195; BUN 32, Cr 1.84; K 3.3; Mg 1.4   Diet Order:  Diet Carb Modified Fluid consistency:: Thin; Room service appropriate?: Yes  Skin:  Reviewed, no issues  Last BM:  6/6  Height:   Ht Readings from Last 1  Encounters:  12/26/15 '5\' 5"'$  (1.651 m)    Weight:   Wt Readings from Last 1 Encounters:  12/27/15 184 lb (83.462 kg)    Ideal Body Weight:  56.81 kg  BMI:  Body mass index is 30.62 kg/(m^2).  Estimated Nutritional Needs:   Kcal:  1600-1900 calories  Protein:  60-70 grams  Fluid:  >/= 1.6L  EDUCATION NEEDS:   No education needs identified at this time  Kristi Anis. Jonuel Butterfield, MS, RD LDN Inpatient Clinical Dietitian Pager [phone]

## 2015-12-27 NOTE — Progress Notes (Signed)
  Patient with increase confusion and frequent urination- bladder scan- PVR 0cc, Dr. Posey Pronto notified and UA sent. Will continue to assess patient.

## 2015-12-27 NOTE — Progress Notes (Signed)
[facility] Progress Note  Patient: Kristi Kramer GEX:[id number]   PCP: Fae Pippin DOB: 08/26/1945   DOA: 12/26/2015   DOS: 12/27/2015   Date of Service: the patient was seen and examined on 12/27/2015  Subjective: Patient mentions she is feeling better. Denies any dizziness lightheadedness shortness of breath. No nausea no vomiting. Nutrition: Tolerating oral diet  Brief hospital course: Patient was admitted on 12/26/2015, with complaint of recurrent for dizziness as well as still has fatigue and confusion, was found to have acute kidney injury. Currently further plan is continue monitoring for improvement in renal function as well as continue IV hydration.  Assessment and Plan: 1. AKI (acute kidney injury) [facility]) Patient presents with recurrent fall, dizziness, confusion. Found to have acute kidney injury. probable etiology is dehydration. I would continue with IV hydration.monitor renal function in the morning. Unremarkable ultrasound renal.  2. Recurrent fall. Probable syncopal episode. Acute encephalopathy. Patient presented with confusion as a primary complaint. Her acute encephalopathy likely appears to be multifactorial from polypharmacy due to worsening renal function. Also possibility of sleep apnea contributing to it cannot be ruled out. No focal deficit identified on examination. No events on telemetry identified, At present we will continue to monitor the patient on telemetry. Physical therapy consult. Patient is not orthostatic.   3. Hypokalemia. Hypomagnesemia. Prolonged QT interval on EKG. EKG shows prolonged QT interval of 524. Discontinuing Celexa at present. Replacing potassium and magnesium aggressively. Monitoring on telemetry. Recheck in the evening  4. Essential hypertension. Blood pressure stable. We'll continue to monitor.  5. Recurrent CVA. Patient has been following up with neurology and had an extensive workup as an outpatient. Next on the  present does not appear to be having a new CVA.  symptoms ongoing since long and deficit is chronic. Continue aspirin, Lipitor  6. Type 2 diabetes mellitus. Check hemoglobin A1c Placing the patient on moderate sliding scale. Holding metformin as well as Januvia.  7. Obstructive sleep apnea. Continue C Pap daily at bedtime.  Pain management: When necessary Tylenol Activity: Consulted physical therapy Bowel regimen: last BM 12/25/2015 Diet: Cardiac diet carb modified DVT Prophylaxis: subcutaneous Heparin  Advance goals of care discussion: Full code  Family Communication: no family was present at bedside, at the time of interview.   Disposition:  Discharge to home, likely home health Expected discharge date: 12/28/2015  Consultants: None Procedures: None  Antibiotics: Anti-infectives    None        Intake/Output Summary (Last 24 hours) at 12/27/15 1252 Last data filed at 12/27/15 1104  Gross per 24 hour  Intake 1237.5 ml  Output   1050 ml  Net  187.5 ml   Filed Weights   12/26/15 1800 12/27/15 0500  Weight: 83.87 kg (184 lb 14.4 oz) 83.462 kg (184 lb)    Objective: Physical Exam: Filed Vitals:   12/26/15 2240 12/27/15 0500 12/27/15 0733 12/27/15 0810  BP:  126/70    Pulse:  70 82   Temp:  98.1 F (36.7 C)    TempSrc:  Oral    Resp: '18 20 18   '$ Height:      Weight:  83.462 kg (184 lb)    SpO2:  99% 99% 98%    General: Alert, Awake and Oriented to Time, Place and Person. Appear in mild distress Eyes: PERRL, Conjunctiva normal ENT: Oral Mucosa clear moist. Neck: difficult to assess JVD, no Abnormal Mass Or lumps Cardiovascular: S1 and S2 Present, no Murmur, Peripheral Pulses Present Respiratory: Bilateral Air  entry equal and Decreased, Clear to Auscultation, no Crackles, no wheezes Abdomen: Bowel Sound present, Soft and no tenderness Skin: no redness, no Rash  Extremities: no Pedal edema, no calf tenderness Neurologic: Grossly no focal neuro deficit.  Bilaterally Equal motor strength  Data Reviewed: CBC:  Recent Labs Lab 12/26/15 1414 12/27/15 0413  WBC 11.3* 9.3  NEUTROABS 10.5*  --   HGB 13.8 11.5*  HCT 39.5 33.0*  MCV 89.0 87.1  PLT 307 703   Basic Metabolic Panel:  Recent Labs Lab 12/26/15 1414 12/27/15 0413  NA 137 138  K 3.0* 3.3*  CL 99* 101  CO2 25 27  GLUCOSE 219* 126*  BUN 29* 32*  CREATININE 2.18* 1.84*  CALCIUM 10.2 9.5  MG 1.4* 1.4*    Liver Function Tests:  Recent Labs Lab 12/26/15 1414 12/27/15 0413  AST 17 12*  ALT 12* 11*  ALKPHOS 72 55  BILITOT 1.2 0.9  PROT 8.3* 6.6  ALBUMIN 4.2 3.5   No results for input(s): LIPASE, AMYLASE in the last 168 hours. No results for input(s): AMMONIA in the last 168 hours. Coagulation Profile: No results for input(s): INR, PROTIME in the last 168 hours. Cardiac Enzymes:  Recent Labs Lab 12/26/15 1414  TROPONINI <0.03   BNP (last 3 results) No results for input(s): PROBNP in the last 8760 hours.  CBG:  Recent Labs Lab 12/26/15 2133 12/27/15 0739 12/27/15 1142  GLUCAP 195* 107* 135*    Studies: Dg Chest 2 View  12/26/2015  CLINICAL DATA:  Shortness of breath and productive cough with intermittent fever for 3 weeks. EXAM: CHEST  2 VIEW COMPARISON:  Single-view of the chest 09/25/2015 and 05/29/2015. CT chest 07/31/2015. FINDINGS: Mild subsegmental atelectasis is seen in the periphery of the left lung base. The lungs are otherwise clear. No pneumothorax or pleural effusion. Heart size is normal. IMPRESSION: Mild subsegmental atelectasis left lung base.  Otherwise negative. Electronically Signed   By: Inge Rise M.D.   On: 12/26/2015 15:01   Ct Head Wo Contrast  12/26/2015  CLINICAL DATA:  Altered mental status today.  Posterior headache. EXAM: CT HEAD WITHOUT CONTRAST TECHNIQUE: Contiguous axial images were obtained from the base of the skull through the vertex without intravenous contrast. COMPARISON:  09/25/2015 FINDINGS: There is no  evidence of intracranial hemorrhage, brain edema, or other signs of acute infarction. There is no evidence of intracranial mass lesion or mass effect. No abnormal extraaxial fluid collections are identified. Mild chronic small vessel disease is again demonstrated. An old small high right parietal lobe cortical infarct is stable in appearance. No evidence hydrocephalus. No skull abnormality identified. Opacification of left sphenoid sinus and ethmoid sinuses has increased since previous study. Prior left mastoidectomy again noted. IMPRESSION: No acute intracranial abnormality. Stable chronic small vessel disease and old high right parietal lobe infarct. Increased left sphenoid and ethmoid sinus disease compared to prior study. Electronically Signed   By: Earle Gell M.D.   On: 12/26/2015 17:16   US Renal  12/26/2015  CLINICAL DATA:  Renal insufficiency with acute kidney injury. EXAM: RENAL / URINARY TRACT ULTRASOUND COMPLETE COMPARISON:  CT of the abdomen and pelvis on 05/29/2015 FINDINGS: Right Kidney: Length: 12.1 cm. Echogenicity within normal limits. No mass or hydronephrosis visualized. Left Kidney: Length: 11.8 cm. Echogenicity within normal limits. No mass or hydronephrosis visualized. Lower pole cyst consistent with simple cyst measures approximately 1.8 x 1.2 x 2.2 cm. Bladder: Appears normal for degree of bladder distention. IMPRESSION: Normal sized kidneys  without evidence of hydronephrosis or significant atrophy. Electronically Signed   By: Aletta Edouard M.D.   On: 12/26/2015 20:09     Scheduled Meds: . aspirin EC  81 mg Oral Daily  . atorvastatin  20 mg Oral Daily  . feeding supplement (ENSURE ENLIVE)  237 mL Oral BID BM  . fluticasone furoate-vilanterol  1 puff Inhalation Daily  . heparin  5,000 Units Subcutaneous Q8H  . insulin aspart  0-15 Units Subcutaneous TID WC  . insulin aspart  0-5 Units Subcutaneous QHS  . pantoprazole  40 mg Oral Daily  . sodium chloride flush  3 mL Intravenous  Q12H  . topiramate  25 mg Oral QHS   Continuous Infusions: . sodium chloride 75 mL/hr at 12/26/15 2055   PRN Meds: acetaminophen **OR** acetaminophen, albuterol, ALPRAZolam, ondansetron **OR** ondansetron (ZOFRAN) IV, traMADol  Time spent: 30 minutes  Author: Berle Mull, MD [facility] Pager: [phone] 12/27/2015 12:52 PM  If 7PM-7AM, please contact night-coverage at [email or web address], password [facility]

## 2015-12-27 NOTE — Care Management Obs Status (Signed)
[place] NOTIFICATION   Patient Details  Name: GRAZIELLA CONNERY MRN: [id number] Date of Birth: 1945-10-28   Medicare Observation Status Notification Given:  Yes    MahabirJuliann Pulse, RN 12/27/2015, 1:04 PM

## 2015-12-28 DIAGNOSIS — N179 Acute kidney failure, unspecified: Secondary | ICD-10-CM | POA: Diagnosis not present

## 2015-12-28 DIAGNOSIS — E785 Hyperlipidemia, unspecified: Secondary | ICD-10-CM | POA: Diagnosis not present

## 2015-12-28 DIAGNOSIS — G4733 Obstructive sleep apnea (adult) (pediatric): Secondary | ICD-10-CM | POA: Diagnosis not present

## 2015-12-28 DIAGNOSIS — I10 Essential (primary) hypertension: Secondary | ICD-10-CM | POA: Diagnosis not present

## 2015-12-28 LAB — BASIC METABOLIC PANEL
Anion gap: 7 (ref 5–15)
BUN: 19 mg/dL (ref 6–20)
CHLORIDE: 101 mmol/L (ref 101–111)
CO2: 27 mmol/L (ref 22–32)
Calcium: 8.9 mg/dL (ref 8.9–10.3)
Creatinine, Ser: 0.87 mg/dL (ref 0.44–1.00)
Glucose, Bld: 125 mg/dL — ABNORMAL HIGH (ref 65–99)
Potassium: 3.1 mmol/L — ABNORMAL LOW (ref 3.5–5.1)
SODIUM: 135 mmol/L (ref 135–145)

## 2015-12-28 LAB — HEMOGLOBIN A1C
HEMOGLOBIN A1C: 6 % — AB (ref 4.8–5.6)
Mean Plasma Glucose: 126 mg/dL

## 2015-12-28 LAB — MAGNESIUM: Magnesium: 1.5 mg/dL — ABNORMAL LOW (ref 1.7–2.4)

## 2015-12-28 LAB — GLUCOSE, CAPILLARY: Glucose-Capillary: 113 mg/dL — ABNORMAL HIGH (ref 65–99)

## 2015-12-28 MED ORDER — PHENAZOPYRIDINE HCL 100 MG PO TABS: 100.0000 mg | ORAL_TABLET | Freq: Three times a day (TID) | ORAL | Status: DC | PRN

## 2015-12-28 MED ORDER — ALPRAZOLAM 0.5 MG PO TABS: 0.5000 mg | ORAL_TABLET | Freq: Every day | ORAL | Status: DC | PRN

## 2015-12-28 MED ORDER — MAGNESIUM OXIDE -MG SUPPLEMENT 400 (240 MG) MG PO TABS: 1.0000 | ORAL_TABLET | ORAL | Status: DC

## 2015-12-28 MED ORDER — PHENAZOPYRIDINE HCL 100 MG PO TABS
100.0000 mg | ORAL_TABLET | Freq: Three times a day (TID) | ORAL | Status: DC
  Administered 2015-12-28: 100 mg via ORAL
  Filled 2015-12-28 (×4): qty 1

## 2015-12-28 MED ORDER — ENSURE ENLIVE PO LIQD: 237.0000 mL | Freq: Two times a day (BID) | ORAL | Status: DC

## 2015-12-28 MED ORDER — POTASSIUM CHLORIDE CRYS ER 20 MEQ PO TBCR
40.0000 meq | EXTENDED_RELEASE_TABLET | ORAL | Status: AC
  Administered 2015-12-28 (×2): 40 meq via ORAL
  Filled 2015-12-28 (×2): qty 2

## 2015-12-28 MED ORDER — MAGNESIUM SULFATE 2 GM/50ML IV SOLN
2.0000 g | Freq: Once | INTRAVENOUS | Status: AC
  Administered 2015-12-28: 2 g via INTRAVENOUS
  Filled 2015-12-28: qty 50

## 2015-12-28 MED ORDER — POTASSIUM CHLORIDE CRYS ER 20 MEQ PO TBCR: 20.0000 meq | EXTENDED_RELEASE_TABLET | ORAL | Status: DC

## 2015-12-29 LAB — URINE CULTURE: Culture: 60000 — AB

## 2015-12-31 NOTE — Discharge Summary (Signed)
[facility] Discharge Summary   Patient: Kristi Kramer KPT:[id number]   PCP: Fae Pippin DOB: 04-02-1946   Date of admission: 12/26/2015   Date of discharge: 12/28/2015     Discharge Diagnoses:  Principal Problem:   AKI (acute kidney injury) ([place]) Active Problems:   Syncopal episodes   Type 2 diabetes mellitus ([place])   Essential hypertension   Hyperlipidemia with target LDL less than 100   OSA (obstructive sleep apnea)   Prolonged Q-T interval on ECG   Admitted From: Home Disposition:  Home with home health  Recommendations for Outpatient Follow-up:  1. Please follow-up with PCP in one week   Follow-up Information    Follow up with CONROY,NATHAN, PA-C. Schedule an appointment as soon as possible for a visit in 1 week.   Specialty:  Physician Assistant   Why:  with kidney function.    Contact information:   [place] [address] [phone]       Call Dudley Major, DO.   Specialty:  Neurology   Why:  As needed   Contact information:   [address] [place] [address] [phone]      Diet recommendation: Cardiac diet  Activity: The patient is advised to gradually reintroduce usual activities.  Discharge Condition: good  Code Status: Full code  History of present illness: As per the H and P dictated on admission, "Kristi Kramer is a 70 y.o. 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 The patient was brought to ER by EMS. Daughter was not able to wake up the patient at all. Per EMS patient did not loss any pulse. No fall reported. Patient remembers having breakfast but after that she generally goes to sleep on a regular basis. Patient's medications were adjusted yesterday but the family does not know  which one. No other recent change in medications reported by patient. Patient had 6 falls in last 3 weeks. I'll describes as having dizziness with loss of balance. Patient complains of chronic left hip pain for which she takes tramadol as well as ibuprofen almost on daily basis. No focal deficit which is new. No dizziness no lightheadedness which is new. Patient denies any nausea or vomiting or diarrhea. Patient reports decrease oral intake. Per patient's daughter she has facial swelling every morning."  Hospital Course:  Summary of her active problems in the hospital is as following. 1. AKI (acute kidney injury) [facility]) Patient presents with recurrent fall, dizziness, confusion. Found to have acute kidney injury. probable etiology is dehydration. Patient was given IV hydration resulting in significant improvement in renal function. Ultrasound renal was also negative for any obstruction or hydronephrosis.  2. Recurrent fall. Probable syncopal episode. Acute encephalopathy. Patient presented with confusion as a primary complaint. Her acute encephalopathy likely appears to be multifactorial from polypharmacy due to worsening renal function. Also possibility of sleep apnea contributing to it cannot be ruled out. No focal deficit identified on examination. No events on telemetry identified. Patient is not orthostatic.   3. Hypokalemia. Hypomagnesemia. Prolonged QT interval on EKG. EKG shows prolonged QT interval of 524. Discontinuing Phenergan at present. Replaced potassium and magnesium aggressively. In discharging on daily potassium  4. Essential hypertension. Blood pressure stable. We'll continue to monitor.  5. Recurrent CVA. Patient has been following up with neurology  and had an extensive workup as an outpatient. Next on the present does not appear to be having a new CVA.  symptoms ongoing since long and deficit is chronic. Continue aspirin, Lipitor  6. Type 2 diabetes  mellitus. hemoglobin A1c 6.0 Continue metformin as well as Januvia.  7. Obstructive sleep apnea. Continue C Pap daily at bedtime.  8. Klebsiella bacteriuria. Patient's urine culture grew 60,000 colonies of Klebsiella. Since the patient did not have any symptoms of infection and the colony count was not significant no antibiotics were prescribed.  All other chronic medical condition were stable during the hospitalization.  Patient was seen by physical therapy, who recommended home health, which was arranged by Education officer, museum and case Freight forwarder. On the day of the discharge the patient's vitals are stable in mentation is improved to baseline, and no other acute medical condition were reported by patient. the patient was felt safe to be discharge at home with home health.  Procedures and Results:  Ultrasound renal   Consultations:  None  DISCHARGE MEDICATION: Discharge Medication List as of 12/28/2015  9:43 AM    START taking these medications   Details  feeding supplement, ENSURE ENLIVE, (ENSURE ENLIVE) LIQD Take 237 mLs by mouth 2 (two) times daily between meals., Starting 12/28/2015, Until Discontinued, Normal    Magnesium Oxide 400 (240 Mg) MG TABS Take 1 tablet by mouth every other day., Starting 12/28/2015, Until Discontinued, Normal    phenazopyridine (PYRIDIUM) 100 MG tablet Take 1 tablet (100 mg total) by mouth 3 (three) times daily as needed for pain., Starting 12/28/2015, Until Discontinued, Normal    potassium chloride SA (K-DUR,KLOR-CON) 20 MEQ tablet Take 1 tablet (20 mEq total) by mouth every other day., Starting 12/28/2015, Until Discontinued, Normal      CONTINUE these medications which have CHANGED   Details  ALPRAZolam (XANAX) 0.5 MG tablet Take 1 tablet (0.5 mg total) by mouth daily as needed. For anxiety., Starting 12/28/2015, Until Discontinued, No Print      CONTINUE these medications which have NOT CHANGED   Details  ADVAIR DISKUS 250-50 MCG/DOSE AEPB Inhale 1 puff  into the lungs 2 (two) times daily., Starting 11/27/2015, Until Discontinued, Historical Med    albuterol (PROVENTIL) (2.5 MG/3ML) 0.083% nebulizer solution Take 2.5 mg by nebulization 3 (three) times daily as needed for wheezing or shortness of breath., Until Discontinued, Historical Med    aspirin EC 81 MG tablet Take 81 mg by mouth daily. , Until Discontinued, Historical Med    atorvastatin (LIPITOR) 20 MG tablet Take 20 mg by mouth daily. , Starting 06/02/2015, Until Discontinued, Historical Med    azelastine (ASTELIN) 0.1 % nasal spray Place 1 spray into both nostrils 2 (two) times daily. Use in each nostril as directed, Until Discontinued, Historical Med    citalopram (CELEXA) 20 MG tablet Take 20 mg by mouth daily., Until Discontinued, Historical Med    esomeprazole (NEXIUM) 20 MG capsule Take 20 mg by mouth daily at 12 noon., Until Discontinued, Historical Med    loratadine (CLARITIN) 10 MG tablet Take 10 mg by mouth daily. , Until Discontinued, Historical Med    metFORMIN (GLUCOPHAGE) 1000 MG tablet Take 1,000 mg by mouth 2 (two) times daily with a meal. , Starting 06/02/2015, Until Discontinued, Historical Med    sitaGLIPtin (JANUVIA) 100 MG tablet Take 100 mg by mouth daily., Until Discontinued, Historical Med    topiramate (TOPAMAX) 25 MG tablet Take 1 tablet (25 mg total) by mouth at bedtime., Starting 10/18/2015,  Until Discontinued, Normal    traMADol (ULTRAM) 50 MG tablet Take 50 mg by mouth every 6 (six) hours as needed for moderate pain., Until Discontinued, Historical Med    VENTOLIN HFA 108 (90 BASE) MCG/ACT inhaler Inhale 2 puffs into the lungs every 6 (six) hours as needed. wheezing, Starting 04/10/2015, Until Discontinued, Historical Med      STOP taking these medications     ibuprofen (ADVIL,MOTRIN) 200 MG tablet      pantoprazole (PROTONIX) 40 MG tablet      promethazine (PHENERGAN) 25 MG tablet        Allergies  Allergen Reactions  . Mometasone Anaphylaxis      Turns Red  . Codeine Nausea And Vomiting  . Sulfa Antibiotics Nausea And Vomiting   Discharge Instructions    Diet - low sodium heart healthy    Complete by:  As directed      Discharge instructions    Complete by:  As directed   It is important that you read following instructions as well as go over your medication list with RN to help you understand your care after this hospitalization.  Discharge Instructions: Please follow-up with PCP in one week  Please request your primary care physician to go over all Hospital Tests and Procedure/Radiological results at the follow up,  Please get all Hospital records sent to your PCP by signing hospital release before you go home.   Do not drive, operating heavy machinery, perform activities at heights, swimming or participation in water activities or provide baby sitting services if your were admitted for passing out medication; until you have been seen by Primary Care Physician or a Neurologist and advised to do so again. Do not take more than prescribed Pain, Sleep and Anxiety Medications. You were cared for by a hospitalist during your hospital stay. If you have any questions about your discharge medications or the care you received while you were in the hospital after you are discharged, you can call the unit and ask to speak with the hospitalist on call if the hospitalist that took care of you is not available.  Once you are discharged, your primary care physician will handle any further medical issues. Please note that NO REFILLS for any discharge medications will be authorized once you are discharged, as it is imperative that you return to your primary care physician (or establish a relationship with a primary care physician if you do not have one) for your aftercare needs so that they can reassess your need for medications and monitor your lab values. You Must read complete instructions/literature along with all the possible adverse  reactions/side effects for all the Medicines you take and that have been prescribed to you. Take any new Medicines after you have completely understood and accept all the possible adverse reactions/side effects. Wear Seat belts while driving.     Encourage fluids    Complete by:  As directed      Increase activity slowly    Complete by:  As directed           Discharge Exam: Filed Weights   12/26/15 1800 12/27/15 0500  Weight: 83.87 kg (184 lb 14.4 oz) 83.462 kg (184 lb)   Filed Vitals:   12/27/15 2255 12/28/15 0442  BP:  154/83  Pulse:  90  Temp:  98.5 F (36.9 C)  Resp: 18 18   General: Appear in no distress, no Rash; Oral Mucosa moist. Cardiovascular: S1 and S2 Present, no Murmur, no  JVD Respiratory: Bilateral Air entry present and Clear to Auscultation, no Crackles, no wheezes Abdomen: Bowel Sound present, Soft and no tenderness Extremities: trace Pedal edema, no calf tenderness Neurology: Grossly no focal neuro deficit.  The results of significant diagnostics from this hospitalization (including imaging, microbiology, ancillary and laboratory) are listed below for reference.    Significant Diagnostic Studies: Dg Chest 2 View  12/26/2015  CLINICAL DATA:  Shortness of breath and productive cough with intermittent fever for 3 weeks. EXAM: CHEST  2 VIEW COMPARISON:  Single-view of the chest 09/25/2015 and 05/29/2015. CT chest 07/31/2015. FINDINGS: Mild subsegmental atelectasis is seen in the periphery of the left lung base. The lungs are otherwise clear. No pneumothorax or pleural effusion. Heart size is normal. IMPRESSION: Mild subsegmental atelectasis left lung base.  Otherwise negative. Electronically Signed   By: Inge Rise M.D.   On: 12/26/2015 15:01   Ct Head Wo Contrast  12/26/2015  CLINICAL DATA:  Altered mental status today.  Posterior headache. EXAM: CT HEAD WITHOUT CONTRAST TECHNIQUE: Contiguous axial images were obtained from the base of the skull through the  vertex without intravenous contrast. COMPARISON:  09/25/2015 FINDINGS: There is no evidence of intracranial hemorrhage, brain edema, or other signs of acute infarction. There is no evidence of intracranial mass lesion or mass effect. No abnormal extraaxial fluid collections are identified. Mild chronic small vessel disease is again demonstrated. An old small high right parietal lobe cortical infarct is stable in appearance. No evidence hydrocephalus. No skull abnormality identified. Opacification of left sphenoid sinus and ethmoid sinuses has increased since previous study. Prior left mastoidectomy again noted. IMPRESSION: No acute intracranial abnormality. Stable chronic small vessel disease and old high right parietal lobe infarct. Increased left sphenoid and ethmoid sinus disease compared to prior study. Electronically Signed   By: Earle Gell M.D.   On: 12/26/2015 17:16   US Renal  12/26/2015  CLINICAL DATA:  Renal insufficiency with acute kidney injury. EXAM: RENAL / URINARY TRACT ULTRASOUND COMPLETE COMPARISON:  CT of the abdomen and pelvis on 05/29/2015 FINDINGS: Right Kidney: Length: 12.1 cm. Echogenicity within normal limits. No mass or hydronephrosis visualized. Left Kidney: Length: 11.8 cm. Echogenicity within normal limits. No mass or hydronephrosis visualized. Lower pole cyst consistent with simple cyst measures approximately 1.8 x 1.2 x 2.2 cm. Bladder: Appears normal for degree of bladder distention. IMPRESSION: Normal sized kidneys without evidence of hydronephrosis or significant atrophy. Electronically Signed   By: Aletta Edouard M.D.   On: 12/26/2015 20:09    Microbiology: Recent Results (from the past 240 hour(s))  Urine culture     Status: Abnormal   Collection Time: 12/26/15  5:46 PM  Result Value Ref Range Status   Specimen Description URINE, CATHETERIZED  Final   Special Requests NONE  Final   Culture 60,000 COLONIES/mL KLEBSIELLA PNEUMONIAE (A)  Final   Report Status 12/29/2015  FINAL  Final   Organism ID, Bacteria KLEBSIELLA PNEUMONIAE (A)  Final      Susceptibility   Klebsiella pneumoniae - MIC*    AMPICILLIN >=32 RESISTANT Resistant     CEFAZOLIN <=4 SENSITIVE Sensitive     CEFTRIAXONE <=1 SENSITIVE Sensitive     CIPROFLOXACIN <=0.25 SENSITIVE Sensitive     GENTAMICIN <=1 SENSITIVE Sensitive     IMIPENEM <=0.25 SENSITIVE Sensitive     NITROFURANTOIN 64 INTERMEDIATE Intermediate     TRIMETH/SULFA <=20 SENSITIVE Sensitive     AMPICILLIN/SULBACTAM 16 INTERMEDIATE Intermediate     PIP/TAZO 16 SENSITIVE Sensitive     *  60,000 COLONIES/mL KLEBSIELLA PNEUMONIAE     Labs: CBC:  Recent Labs Lab 12/26/15 1414 12/27/15 0413  WBC 11.3* 9.3  NEUTROABS 10.5*  --   HGB 13.8 11.5*  HCT 39.5 33.0*  MCV 89.0 87.1  PLT 307 166   Basic Metabolic Panel:  Recent Labs Lab 12/26/15 1414 12/27/15 0413 12/27/15 1448 12/28/15 0436  NA 137 138 136 135  K 3.0* 3.3* 3.3* 3.1*  CL 99* 101 100* 101  CO2 '25 27 27 27  '$ GLUCOSE 219* 126* 143* 125*  BUN 29* 32* 29* 19  CREATININE 2.18* 1.84* 1.52* 0.87  CALCIUM 10.2 9.5 9.3 8.9  MG 1.4* 1.4* 2.0 1.5*   Liver Function Tests:  Recent Labs Lab 12/26/15 1414 12/27/15 0413  AST 17 12*  ALT 12* 11*  ALKPHOS 72 55  BILITOT 1.2 0.9  PROT 8.3* 6.6  ALBUMIN 4.2 3.5   No results for input(s): LIPASE, AMYLASE in the last 168 hours. No results for input(s): AMMONIA in the last 168 hours. Cardiac Enzymes:  Recent Labs Lab 12/26/15 1414  TROPONINI <0.03   BNP (last 3 results)  Recent Labs  12/26/15 1414  BNP 270.2*   CBG:  Recent Labs Lab 12/27/15 0739 12/27/15 1142 12/27/15 1726 12/27/15 2203 12/28/15 0739  GLUCAP 107* 135* 153* 122* 113*   Time spent: 30 minutes  Signed:  Juliani Laduke  [facility] 12/28/2015 , 6:51 AM

## 2016-01-01 DIAGNOSIS — I69354 Hemiplegia and hemiparesis following cerebral infarction affecting left non-dominant side: Secondary | ICD-10-CM | POA: Diagnosis not present

## 2016-01-01 DIAGNOSIS — E119 Type 2 diabetes mellitus without complications: Secondary | ICD-10-CM | POA: Diagnosis not present

## 2016-01-01 DIAGNOSIS — R296 Repeated falls: Secondary | ICD-10-CM | POA: Diagnosis not present

## 2016-01-01 DIAGNOSIS — G4733 Obstructive sleep apnea (adult) (pediatric): Secondary | ICD-10-CM | POA: Diagnosis not present

## 2016-01-01 DIAGNOSIS — K219 Gastro-esophageal reflux disease without esophagitis: Secondary | ICD-10-CM | POA: Diagnosis not present

## 2016-01-01 DIAGNOSIS — Z7984 Long term (current) use of oral hypoglycemic drugs: Secondary | ICD-10-CM | POA: Diagnosis not present

## 2016-01-01 DIAGNOSIS — Z87891 Personal history of nicotine dependence: Secondary | ICD-10-CM | POA: Diagnosis not present

## 2016-01-01 DIAGNOSIS — M150 Primary generalized (osteo)arthritis: Secondary | ICD-10-CM | POA: Diagnosis not present

## 2016-01-01 DIAGNOSIS — E785 Hyperlipidemia, unspecified: Secondary | ICD-10-CM | POA: Diagnosis not present

## 2016-01-01 DIAGNOSIS — I10 Essential (primary) hypertension: Secondary | ICD-10-CM | POA: Diagnosis not present

## 2016-01-01 DIAGNOSIS — J449 Chronic obstructive pulmonary disease, unspecified: Secondary | ICD-10-CM | POA: Diagnosis not present

## 2016-01-02 DIAGNOSIS — J449 Chronic obstructive pulmonary disease, unspecified: Secondary | ICD-10-CM | POA: Diagnosis not present

## 2016-01-02 DIAGNOSIS — E119 Type 2 diabetes mellitus without complications: Secondary | ICD-10-CM | POA: Diagnosis not present

## 2016-01-02 DIAGNOSIS — R296 Repeated falls: Secondary | ICD-10-CM | POA: Diagnosis not present

## 2016-01-02 DIAGNOSIS — I69354 Hemiplegia and hemiparesis following cerebral infarction affecting left non-dominant side: Secondary | ICD-10-CM | POA: Diagnosis not present

## 2016-01-02 DIAGNOSIS — I10 Essential (primary) hypertension: Secondary | ICD-10-CM | POA: Diagnosis not present

## 2016-01-02 DIAGNOSIS — M150 Primary generalized (osteo)arthritis: Secondary | ICD-10-CM | POA: Diagnosis not present

## 2016-01-07 DIAGNOSIS — E119 Type 2 diabetes mellitus without complications: Secondary | ICD-10-CM | POA: Diagnosis not present

## 2016-01-07 DIAGNOSIS — R296 Repeated falls: Secondary | ICD-10-CM | POA: Diagnosis not present

## 2016-01-07 DIAGNOSIS — J449 Chronic obstructive pulmonary disease, unspecified: Secondary | ICD-10-CM | POA: Diagnosis not present

## 2016-01-07 DIAGNOSIS — I69354 Hemiplegia and hemiparesis following cerebral infarction affecting left non-dominant side: Secondary | ICD-10-CM | POA: Diagnosis not present

## 2016-01-07 DIAGNOSIS — M150 Primary generalized (osteo)arthritis: Secondary | ICD-10-CM | POA: Diagnosis not present

## 2016-01-07 DIAGNOSIS — I10 Essential (primary) hypertension: Secondary | ICD-10-CM | POA: Diagnosis not present

## 2016-01-08 DIAGNOSIS — N179 Acute kidney failure, unspecified: Secondary | ICD-10-CM | POA: Diagnosis not present

## 2016-01-08 DIAGNOSIS — J449 Chronic obstructive pulmonary disease, unspecified: Secondary | ICD-10-CM | POA: Diagnosis not present

## 2016-01-08 DIAGNOSIS — N390 Urinary tract infection, site not specified: Secondary | ICD-10-CM | POA: Diagnosis not present

## 2016-01-08 DIAGNOSIS — E119 Type 2 diabetes mellitus without complications: Secondary | ICD-10-CM | POA: Diagnosis not present

## 2016-01-08 DIAGNOSIS — Z79899 Other long term (current) drug therapy: Secondary | ICD-10-CM | POA: Diagnosis not present

## 2016-01-08 DIAGNOSIS — E876 Hypokalemia: Secondary | ICD-10-CM | POA: Diagnosis not present

## 2016-01-09 DIAGNOSIS — J449 Chronic obstructive pulmonary disease, unspecified: Secondary | ICD-10-CM | POA: Diagnosis not present

## 2016-01-09 DIAGNOSIS — R296 Repeated falls: Secondary | ICD-10-CM | POA: Diagnosis not present

## 2016-01-09 DIAGNOSIS — E119 Type 2 diabetes mellitus without complications: Secondary | ICD-10-CM | POA: Diagnosis not present

## 2016-01-09 DIAGNOSIS — I10 Essential (primary) hypertension: Secondary | ICD-10-CM | POA: Diagnosis not present

## 2016-01-09 DIAGNOSIS — M150 Primary generalized (osteo)arthritis: Secondary | ICD-10-CM | POA: Diagnosis not present

## 2016-01-09 DIAGNOSIS — I69354 Hemiplegia and hemiparesis following cerebral infarction affecting left non-dominant side: Secondary | ICD-10-CM | POA: Diagnosis not present

## 2016-01-14 DIAGNOSIS — M150 Primary generalized (osteo)arthritis: Secondary | ICD-10-CM | POA: Diagnosis not present

## 2016-01-14 DIAGNOSIS — E119 Type 2 diabetes mellitus without complications: Secondary | ICD-10-CM | POA: Diagnosis not present

## 2016-01-14 DIAGNOSIS — J449 Chronic obstructive pulmonary disease, unspecified: Secondary | ICD-10-CM | POA: Diagnosis not present

## 2016-01-14 DIAGNOSIS — R296 Repeated falls: Secondary | ICD-10-CM | POA: Diagnosis not present

## 2016-01-14 DIAGNOSIS — I10 Essential (primary) hypertension: Secondary | ICD-10-CM | POA: Diagnosis not present

## 2016-01-14 DIAGNOSIS — I69354 Hemiplegia and hemiparesis following cerebral infarction affecting left non-dominant side: Secondary | ICD-10-CM | POA: Diagnosis not present

## 2016-01-16 DIAGNOSIS — M150 Primary generalized (osteo)arthritis: Secondary | ICD-10-CM | POA: Diagnosis not present

## 2016-01-16 DIAGNOSIS — I10 Essential (primary) hypertension: Secondary | ICD-10-CM | POA: Diagnosis not present

## 2016-01-16 DIAGNOSIS — J449 Chronic obstructive pulmonary disease, unspecified: Secondary | ICD-10-CM | POA: Diagnosis not present

## 2016-01-16 DIAGNOSIS — I69354 Hemiplegia and hemiparesis following cerebral infarction affecting left non-dominant side: Secondary | ICD-10-CM | POA: Diagnosis not present

## 2016-01-16 DIAGNOSIS — R296 Repeated falls: Secondary | ICD-10-CM | POA: Diagnosis not present

## 2016-01-16 DIAGNOSIS — E119 Type 2 diabetes mellitus without complications: Secondary | ICD-10-CM | POA: Diagnosis not present

## 2016-01-21 DIAGNOSIS — M150 Primary generalized (osteo)arthritis: Secondary | ICD-10-CM | POA: Diagnosis not present

## 2016-01-21 DIAGNOSIS — E119 Type 2 diabetes mellitus without complications: Secondary | ICD-10-CM | POA: Diagnosis not present

## 2016-01-21 DIAGNOSIS — R296 Repeated falls: Secondary | ICD-10-CM | POA: Diagnosis not present

## 2016-01-21 DIAGNOSIS — J449 Chronic obstructive pulmonary disease, unspecified: Secondary | ICD-10-CM | POA: Diagnosis not present

## 2016-01-21 DIAGNOSIS — I69354 Hemiplegia and hemiparesis following cerebral infarction affecting left non-dominant side: Secondary | ICD-10-CM | POA: Diagnosis not present

## 2016-01-21 DIAGNOSIS — I10 Essential (primary) hypertension: Secondary | ICD-10-CM | POA: Diagnosis not present

## 2016-01-23 DIAGNOSIS — I69354 Hemiplegia and hemiparesis following cerebral infarction affecting left non-dominant side: Secondary | ICD-10-CM | POA: Diagnosis not present

## 2016-01-23 DIAGNOSIS — I10 Essential (primary) hypertension: Secondary | ICD-10-CM | POA: Diagnosis not present

## 2016-01-23 DIAGNOSIS — J449 Chronic obstructive pulmonary disease, unspecified: Secondary | ICD-10-CM | POA: Diagnosis not present

## 2016-01-23 DIAGNOSIS — E119 Type 2 diabetes mellitus without complications: Secondary | ICD-10-CM | POA: Diagnosis not present

## 2016-01-23 DIAGNOSIS — R296 Repeated falls: Secondary | ICD-10-CM | POA: Diagnosis not present

## 2016-01-23 DIAGNOSIS — M150 Primary generalized (osteo)arthritis: Secondary | ICD-10-CM | POA: Diagnosis not present

## 2016-01-28 DIAGNOSIS — N390 Urinary tract infection, site not specified: Secondary | ICD-10-CM | POA: Diagnosis not present

## 2016-01-28 DIAGNOSIS — I10 Essential (primary) hypertension: Secondary | ICD-10-CM | POA: Diagnosis not present

## 2016-01-28 DIAGNOSIS — R296 Repeated falls: Secondary | ICD-10-CM | POA: Diagnosis not present

## 2016-01-28 DIAGNOSIS — I69354 Hemiplegia and hemiparesis following cerebral infarction affecting left non-dominant side: Secondary | ICD-10-CM | POA: Diagnosis not present

## 2016-01-28 DIAGNOSIS — M150 Primary generalized (osteo)arthritis: Secondary | ICD-10-CM | POA: Diagnosis not present

## 2016-01-28 DIAGNOSIS — L298 Other pruritus: Secondary | ICD-10-CM | POA: Diagnosis not present

## 2016-01-28 DIAGNOSIS — E119 Type 2 diabetes mellitus without complications: Secondary | ICD-10-CM | POA: Diagnosis not present

## 2016-01-28 DIAGNOSIS — M545 Low back pain: Secondary | ICD-10-CM | POA: Diagnosis not present

## 2016-01-28 DIAGNOSIS — Z6831 Body mass index (BMI) 31.0-31.9, adult: Secondary | ICD-10-CM | POA: Diagnosis not present

## 2016-01-28 DIAGNOSIS — J449 Chronic obstructive pulmonary disease, unspecified: Secondary | ICD-10-CM | POA: Diagnosis not present

## 2016-01-29 ENCOUNTER — Telehealth: Payer: Self-pay

## 2016-01-29 NOTE — Telephone Encounter (Signed)
Opened in error

## 2016-02-05 DIAGNOSIS — I10 Essential (primary) hypertension: Secondary | ICD-10-CM | POA: Diagnosis not present

## 2016-02-05 DIAGNOSIS — R296 Repeated falls: Secondary | ICD-10-CM | POA: Diagnosis not present

## 2016-02-05 DIAGNOSIS — I69354 Hemiplegia and hemiparesis following cerebral infarction affecting left non-dominant side: Secondary | ICD-10-CM | POA: Diagnosis not present

## 2016-02-05 DIAGNOSIS — J449 Chronic obstructive pulmonary disease, unspecified: Secondary | ICD-10-CM | POA: Diagnosis not present

## 2016-02-05 DIAGNOSIS — M150 Primary generalized (osteo)arthritis: Secondary | ICD-10-CM | POA: Diagnosis not present

## 2016-02-05 DIAGNOSIS — E119 Type 2 diabetes mellitus without complications: Secondary | ICD-10-CM | POA: Diagnosis not present

## 2016-02-07 DIAGNOSIS — N390 Urinary tract infection, site not specified: Secondary | ICD-10-CM | POA: Diagnosis not present

## 2016-02-12 DIAGNOSIS — I69354 Hemiplegia and hemiparesis following cerebral infarction affecting left non-dominant side: Secondary | ICD-10-CM | POA: Diagnosis not present

## 2016-02-12 DIAGNOSIS — R296 Repeated falls: Secondary | ICD-10-CM | POA: Diagnosis not present

## 2016-02-12 DIAGNOSIS — I10 Essential (primary) hypertension: Secondary | ICD-10-CM | POA: Diagnosis not present

## 2016-02-12 DIAGNOSIS — M150 Primary generalized (osteo)arthritis: Secondary | ICD-10-CM | POA: Diagnosis not present

## 2016-02-12 DIAGNOSIS — E119 Type 2 diabetes mellitus without complications: Secondary | ICD-10-CM | POA: Diagnosis not present

## 2016-02-12 DIAGNOSIS — J449 Chronic obstructive pulmonary disease, unspecified: Secondary | ICD-10-CM | POA: Diagnosis not present

## 2016-02-19 ENCOUNTER — Ambulatory Visit: Payer: Medicare Other | Admitting: Neurology

## 2016-02-19 DIAGNOSIS — L298 Other pruritus: Secondary | ICD-10-CM | POA: Diagnosis not present

## 2016-02-19 DIAGNOSIS — B9689 Other specified bacterial agents as the cause of diseases classified elsewhere: Secondary | ICD-10-CM | POA: Diagnosis not present

## 2016-02-19 DIAGNOSIS — N760 Acute vaginitis: Secondary | ICD-10-CM | POA: Diagnosis not present

## 2016-02-20 DIAGNOSIS — I69354 Hemiplegia and hemiparesis following cerebral infarction affecting left non-dominant side: Secondary | ICD-10-CM | POA: Diagnosis not present

## 2016-02-20 DIAGNOSIS — R296 Repeated falls: Secondary | ICD-10-CM | POA: Diagnosis not present

## 2016-02-20 DIAGNOSIS — I10 Essential (primary) hypertension: Secondary | ICD-10-CM | POA: Diagnosis not present

## 2016-02-20 DIAGNOSIS — E119 Type 2 diabetes mellitus without complications: Secondary | ICD-10-CM | POA: Diagnosis not present

## 2016-02-20 DIAGNOSIS — J449 Chronic obstructive pulmonary disease, unspecified: Secondary | ICD-10-CM | POA: Diagnosis not present

## 2016-02-20 DIAGNOSIS — M150 Primary generalized (osteo)arthritis: Secondary | ICD-10-CM | POA: Diagnosis not present

## 2016-02-28 DIAGNOSIS — E782 Mixed hyperlipidemia: Secondary | ICD-10-CM | POA: Diagnosis not present

## 2016-02-28 DIAGNOSIS — Z6830 Body mass index (BMI) 30.0-30.9, adult: Secondary | ICD-10-CM | POA: Diagnosis not present

## 2016-02-28 DIAGNOSIS — E119 Type 2 diabetes mellitus without complications: Secondary | ICD-10-CM | POA: Diagnosis not present

## 2016-02-28 DIAGNOSIS — I10 Essential (primary) hypertension: Secondary | ICD-10-CM | POA: Diagnosis not present

## 2016-02-28 DIAGNOSIS — J449 Chronic obstructive pulmonary disease, unspecified: Secondary | ICD-10-CM | POA: Diagnosis not present

## 2016-02-28 DIAGNOSIS — I639 Cerebral infarction, unspecified: Secondary | ICD-10-CM | POA: Diagnosis not present

## 2016-02-28 DIAGNOSIS — F418 Other specified anxiety disorders: Secondary | ICD-10-CM | POA: Diagnosis not present

## 2016-02-28 DIAGNOSIS — F17200 Nicotine dependence, unspecified, uncomplicated: Secondary | ICD-10-CM | POA: Diagnosis not present

## 2016-03-03 ENCOUNTER — Ambulatory Visit: Payer: Medicare Other | Admitting: Neurology

## 2016-03-07 DIAGNOSIS — Z87891 Personal history of nicotine dependence: Secondary | ICD-10-CM | POA: Diagnosis not present

## 2016-03-07 DIAGNOSIS — F17210 Nicotine dependence, cigarettes, uncomplicated: Secondary | ICD-10-CM | POA: Diagnosis not present

## 2016-03-07 DIAGNOSIS — G4733 Obstructive sleep apnea (adult) (pediatric): Secondary | ICD-10-CM | POA: Diagnosis not present

## 2016-03-07 DIAGNOSIS — E559 Vitamin D deficiency, unspecified: Secondary | ICD-10-CM | POA: Diagnosis not present

## 2016-03-07 DIAGNOSIS — J4520 Mild intermittent asthma, uncomplicated: Secondary | ICD-10-CM | POA: Diagnosis not present

## 2016-03-07 DIAGNOSIS — J310 Chronic rhinitis: Secondary | ICD-10-CM | POA: Diagnosis not present

## 2016-03-08 DIAGNOSIS — G4733 Obstructive sleep apnea (adult) (pediatric): Secondary | ICD-10-CM | POA: Diagnosis not present

## 2016-03-11 ENCOUNTER — Other Ambulatory Visit (INDEPENDENT_AMBULATORY_CARE_PROVIDER_SITE_OTHER): Payer: Medicare Other

## 2016-03-11 ENCOUNTER — Encounter: Payer: Self-pay | Admitting: Neurology

## 2016-03-11 ENCOUNTER — Telehealth: Payer: Self-pay

## 2016-03-11 ENCOUNTER — Ambulatory Visit (INDEPENDENT_AMBULATORY_CARE_PROVIDER_SITE_OTHER): Payer: Medicare Other | Admitting: Neurology

## 2016-03-11 VITALS — BP 124/64 | HR 85 | Ht 65.0 in

## 2016-03-11 DIAGNOSIS — G08 Intracranial and intraspinal phlebitis and thrombophlebitis: Secondary | ICD-10-CM | POA: Diagnosis not present

## 2016-03-11 DIAGNOSIS — Z794 Long term (current) use of insulin: Secondary | ICD-10-CM

## 2016-03-11 DIAGNOSIS — I8290 Acute embolism and thrombosis of unspecified vein: Secondary | ICD-10-CM

## 2016-03-11 DIAGNOSIS — G44229 Chronic tension-type headache, not intractable: Secondary | ICD-10-CM

## 2016-03-11 DIAGNOSIS — I639 Cerebral infarction, unspecified: Secondary | ICD-10-CM

## 2016-03-11 DIAGNOSIS — E785 Hyperlipidemia, unspecified: Secondary | ICD-10-CM

## 2016-03-11 DIAGNOSIS — E119 Type 2 diabetes mellitus without complications: Secondary | ICD-10-CM

## 2016-03-11 DIAGNOSIS — R938 Abnormal findings on diagnostic imaging of other specified body structures: Secondary | ICD-10-CM | POA: Diagnosis not present

## 2016-03-11 DIAGNOSIS — R9389 Abnormal findings on diagnostic imaging of other specified body structures: Secondary | ICD-10-CM

## 2016-03-11 DIAGNOSIS — F17200 Nicotine dependence, unspecified, uncomplicated: Secondary | ICD-10-CM | POA: Diagnosis not present

## 2016-03-11 LAB — LIPID PANEL
CHOLESTEROL: 133 mg/dL (ref 0–200)
HDL: 44.5 mg/dL (ref >39.00)
LDL Cholesterol: 52 mg/dL (ref 0–99)
NonHDL: 88.5
TRIGLYCERIDES: 182 mg/dL — AB (ref 0.0–149.0)
Total CHOL/HDL Ratio: 3
VLDL: 36.4 mg/dL (ref 0.0–40.0)

## 2016-03-11 LAB — BASIC METABOLIC PANEL
BUN: 13 mg/dL (ref 6–23)
CHLORIDE: 104 meq/L (ref 96–112)
CO2: 29 meq/L (ref 19–32)
Calcium: 9.1 mg/dL (ref 8.4–10.5)
Creatinine, Ser: 0.78 mg/dL (ref 0.40–1.20)
GFR: 78.03 mL/min (ref >60.00)
GLUCOSE: 145 mg/dL — AB (ref 70–99)
POTASSIUM: 3 meq/L — AB (ref 3.5–5.1)
SODIUM: 139 meq/L (ref 135–145)

## 2016-03-11 MED ORDER — TOPIRAMATE 50 MG PO TABS
50.0000 mg | ORAL_TABLET | Freq: Every day | ORAL | 3 refills | Status: DC
Start: 2016-03-11 — End: 2016-07-01
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
## 2016-03-11 NOTE — Telephone Encounter (Signed)
-----   Message from Pieter Partridge, DO sent at 03/11/2016  1:25 PM EDT ----- Labs reviewed.  Kidney function is good.  Cholesterol is down, which is good.

## 2016-03-11 NOTE — Patient Instructions (Signed)
1.  We will check a BMP.  If it looks okay, we will increase topiramate to '50mg'$  at bedtime. 2.  Must limit use of pain relievers to no more than 2 days out of the week.  This includes ibuprofen and tramadol.  So, 5 days out of the week, you should not take pain medication 3.  Continue aspirin daily.  Continue Lipitor. 4.  Quit smoking 5.  Follow up in 6 months or as needed.

## 2016-03-11 NOTE — Telephone Encounter (Signed)
Per today's A&P:  1.  We will check a BMP.  If it looks okay, we will increase topiramate to '50mg'$  at bedtime.   Message relayed to patient. Verbalized understanding and denied questions.   RX sent in.

## 2016-03-14 DIAGNOSIS — I10 Essential (primary) hypertension: Secondary | ICD-10-CM | POA: Diagnosis not present

## 2016-03-14 DIAGNOSIS — Z6831 Body mass index (BMI) 31.0-31.9, adult: Secondary | ICD-10-CM | POA: Diagnosis not present

## 2016-03-14 DIAGNOSIS — I639 Cerebral infarction, unspecified: Secondary | ICD-10-CM | POA: Diagnosis not present

## 2016-03-14 LAB — RFX DRVVT SCR W/RFLX CONF 1:1 MIX: dRVVT Screen: 35 s (ref <=45)

## 2016-03-14 LAB — RFX PTT-LA W/RFX TO HEX PHASE CONF: PTT-LA Screen: 38 s (ref <=40)

## 2016-03-16 LAB — HYPERCOAGULABLE PANEL, COMPREHENSIVE
AntiThromb III Func: 81 % activity (ref 80–120)
Anticardiolipin IgG: <14 [GPL'U]
Beta-2-Glycoprotein I IgM: <9 SMU (ref <=20)
PROTEIN C ACTIVITY: 95 % (ref 70–180)
PROTEIN S ANTIGEN, TOTAL: 100 % (ref 70–140)
Protein C Antigen: 98 % (ref 70–140)
Protein S Activity: 97 % (ref 60–140)

## 2016-03-17 DIAGNOSIS — R8271 Bacteriuria: Secondary | ICD-10-CM | POA: Diagnosis not present

## 2016-03-17 DIAGNOSIS — N390 Urinary tract infection, site not specified: Secondary | ICD-10-CM | POA: Diagnosis not present

## 2016-03-17 DIAGNOSIS — N3281 Overactive bladder: Secondary | ICD-10-CM | POA: Diagnosis not present

## 2016-03-18 DIAGNOSIS — F17210 Nicotine dependence, cigarettes, uncomplicated: Secondary | ICD-10-CM | POA: Diagnosis not present

## 2016-03-18 DIAGNOSIS — D860 Sarcoidosis of lung: Secondary | ICD-10-CM | POA: Diagnosis not present

## 2016-03-18 DIAGNOSIS — I270 Primary pulmonary hypertension: Secondary | ICD-10-CM | POA: Diagnosis not present

## 2016-03-18 DIAGNOSIS — J4520 Mild intermittent asthma, uncomplicated: Secondary | ICD-10-CM | POA: Diagnosis not present

## 2016-03-18 DIAGNOSIS — G4733 Obstructive sleep apnea (adult) (pediatric): Secondary | ICD-10-CM | POA: Diagnosis not present

## 2016-03-19 DIAGNOSIS — G4733 Obstructive sleep apnea (adult) (pediatric): Secondary | ICD-10-CM | POA: Diagnosis not present

## 2016-04-01 DIAGNOSIS — Z23 Encounter for immunization: Secondary | ICD-10-CM | POA: Diagnosis not present

## 2016-04-03 DIAGNOSIS — E669 Obesity, unspecified: Secondary | ICD-10-CM | POA: Diagnosis not present

## 2016-04-03 DIAGNOSIS — M545 Low back pain: Secondary | ICD-10-CM | POA: Diagnosis not present

## 2016-04-03 DIAGNOSIS — Z6831 Body mass index (BMI) 31.0-31.9, adult: Secondary | ICD-10-CM | POA: Diagnosis not present

## 2016-04-04 ENCOUNTER — Ambulatory Visit: Payer: Medicare Other | Admitting: Neurology

## 2016-04-23 DIAGNOSIS — J310 Chronic rhinitis: Secondary | ICD-10-CM | POA: Diagnosis not present

## 2016-04-23 DIAGNOSIS — F17210 Nicotine dependence, cigarettes, uncomplicated: Secondary | ICD-10-CM | POA: Diagnosis not present

## 2016-04-23 DIAGNOSIS — G4733 Obstructive sleep apnea (adult) (pediatric): Secondary | ICD-10-CM | POA: Diagnosis not present

## 2016-04-23 DIAGNOSIS — R5383 Other fatigue: Secondary | ICD-10-CM | POA: Diagnosis not present

## 2016-04-23 DIAGNOSIS — I270 Primary pulmonary hypertension: Secondary | ICD-10-CM | POA: Diagnosis not present

## 2016-04-23 DIAGNOSIS — D860 Sarcoidosis of lung: Secondary | ICD-10-CM | POA: Diagnosis not present

## 2016-04-29 DIAGNOSIS — E119 Type 2 diabetes mellitus without complications: Secondary | ICD-10-CM | POA: Diagnosis not present

## 2016-04-29 DIAGNOSIS — I639 Cerebral infarction, unspecified: Secondary | ICD-10-CM | POA: Diagnosis not present

## 2016-04-29 DIAGNOSIS — I10 Essential (primary) hypertension: Secondary | ICD-10-CM | POA: Diagnosis not present

## 2016-04-29 DIAGNOSIS — Z1231 Encounter for screening mammogram for malignant neoplasm of breast: Secondary | ICD-10-CM | POA: Diagnosis not present

## 2016-04-29 DIAGNOSIS — F418 Other specified anxiety disorders: Secondary | ICD-10-CM | POA: Diagnosis not present

## 2016-04-29 DIAGNOSIS — I69354 Hemiplegia and hemiparesis following cerebral infarction affecting left non-dominant side: Secondary | ICD-10-CM | POA: Diagnosis not present

## 2016-04-29 DIAGNOSIS — J449 Chronic obstructive pulmonary disease, unspecified: Secondary | ICD-10-CM | POA: Diagnosis not present

## 2016-04-29 DIAGNOSIS — M545 Low back pain: Secondary | ICD-10-CM | POA: Diagnosis not present

## 2016-04-30 DIAGNOSIS — R079 Chest pain, unspecified: Secondary | ICD-10-CM | POA: Diagnosis not present

## 2016-04-30 DIAGNOSIS — S20212D Contusion of left front wall of thorax, subsequent encounter: Secondary | ICD-10-CM | POA: Diagnosis not present

## 2016-04-30 DIAGNOSIS — R110 Nausea: Secondary | ICD-10-CM | POA: Diagnosis not present

## 2016-05-14 DIAGNOSIS — Z1231 Encounter for screening mammogram for malignant neoplasm of breast: Secondary | ICD-10-CM | POA: Diagnosis not present

## 2016-05-18 ENCOUNTER — Inpatient Hospital Stay (HOSPITAL_COMMUNITY)
Admission: EM | Admit: 2016-05-18 | Discharge: 2016-05-28 | DRG: 025 | Disposition: A | Discharge status: Home / Self Care | Payer: Medicare Other | Attending: Neurosurgery | Admitting: Neurosurgery

## 2016-05-18 ENCOUNTER — Encounter (HOSPITAL_COMMUNITY): Payer: Self-pay | Admitting: Emergency Medicine

## 2016-05-18 DIAGNOSIS — C3490 Malignant neoplasm of unspecified part of unspecified bronchus or lung: Secondary | ICD-10-CM

## 2016-05-18 DIAGNOSIS — Z882 Allergy status to sulfonamides status: Secondary | ICD-10-CM

## 2016-05-18 DIAGNOSIS — D496 Neoplasm of unspecified behavior of brain: Secondary | ICD-10-CM | POA: Diagnosis present

## 2016-05-18 DIAGNOSIS — C8590 Non-Hodgkin lymphoma, unspecified, unspecified site: Secondary | ICD-10-CM | POA: Diagnosis not present

## 2016-05-18 DIAGNOSIS — Z1612 Extended spectrum beta lactamase (ESBL) resistance: Secondary | ICD-10-CM | POA: Diagnosis present

## 2016-05-18 DIAGNOSIS — E7800 Pure hypercholesterolemia, unspecified: Secondary | ICD-10-CM | POA: Diagnosis present

## 2016-05-18 DIAGNOSIS — Z888 Allergy status to other drugs, medicaments and biological substances status: Secondary | ICD-10-CM

## 2016-05-18 DIAGNOSIS — A499 Bacterial infection, unspecified: Secondary | ICD-10-CM

## 2016-05-18 DIAGNOSIS — J449 Chronic obstructive pulmonary disease, unspecified: Secondary | ICD-10-CM | POA: Diagnosis present

## 2016-05-18 DIAGNOSIS — G936 Cerebral edema: Secondary | ICD-10-CM | POA: Diagnosis not present

## 2016-05-18 DIAGNOSIS — C7931 Secondary malignant neoplasm of brain: Secondary | ICD-10-CM | POA: Diagnosis not present

## 2016-05-18 DIAGNOSIS — Z01818 Encounter for other preprocedural examination: Secondary | ICD-10-CM

## 2016-05-18 DIAGNOSIS — R569 Unspecified convulsions: Secondary | ICD-10-CM | POA: Diagnosis not present

## 2016-05-18 DIAGNOSIS — D869 Sarcoidosis, unspecified: Secondary | ICD-10-CM | POA: Diagnosis present

## 2016-05-18 DIAGNOSIS — Z452 Encounter for adjustment and management of vascular access device: Secondary | ICD-10-CM

## 2016-05-18 DIAGNOSIS — R402431 Glasgow coma scale score 3-8, in the field [EMT or ambulance]: Secondary | ICD-10-CM | POA: Diagnosis not present

## 2016-05-18 DIAGNOSIS — Z7951 Long term (current) use of inhaled steroids: Secondary | ICD-10-CM

## 2016-05-18 DIAGNOSIS — Z7984 Long term (current) use of oral hypoglycemic drugs: Secondary | ICD-10-CM

## 2016-05-18 DIAGNOSIS — Z515 Encounter for palliative care: Secondary | ICD-10-CM

## 2016-05-18 DIAGNOSIS — F17210 Nicotine dependence, cigarettes, uncomplicated: Secondary | ICD-10-CM | POA: Diagnosis present

## 2016-05-18 DIAGNOSIS — K219 Gastro-esophageal reflux disease without esophagitis: Secondary | ICD-10-CM | POA: Diagnosis present

## 2016-05-18 DIAGNOSIS — Z7982 Long term (current) use of aspirin: Secondary | ICD-10-CM

## 2016-05-18 DIAGNOSIS — Z6834 Body mass index (BMI) 34.0-34.9, adult: Secondary | ICD-10-CM

## 2016-05-18 DIAGNOSIS — I10 Essential (primary) hypertension: Secondary | ICD-10-CM | POA: Diagnosis present

## 2016-05-18 DIAGNOSIS — Z79899 Other long term (current) drug therapy: Secondary | ICD-10-CM

## 2016-05-18 DIAGNOSIS — R748 Abnormal levels of other serum enzymes: Secondary | ICD-10-CM | POA: Diagnosis present

## 2016-05-18 DIAGNOSIS — E669 Obesity, unspecified: Secondary | ICD-10-CM | POA: Diagnosis present

## 2016-05-18 DIAGNOSIS — X58XXXA Exposure to other specified factors, initial encounter: Secondary | ICD-10-CM | POA: Diagnosis present

## 2016-05-18 DIAGNOSIS — E118 Type 2 diabetes mellitus with unspecified complications: Secondary | ICD-10-CM

## 2016-05-18 DIAGNOSIS — I5042 Chronic combined systolic (congestive) and diastolic (congestive) heart failure: Secondary | ICD-10-CM | POA: Diagnosis not present

## 2016-05-18 DIAGNOSIS — Z791 Long term (current) use of non-steroidal anti-inflammatories (NSAID): Secondary | ICD-10-CM

## 2016-05-18 DIAGNOSIS — R9389 Abnormal findings on diagnostic imaging of other specified body structures: Secondary | ICD-10-CM

## 2016-05-18 DIAGNOSIS — R51 Headache: Secondary | ICD-10-CM | POA: Diagnosis present

## 2016-05-18 DIAGNOSIS — I110 Hypertensive heart disease with heart failure: Secondary | ICD-10-CM | POA: Diagnosis present

## 2016-05-18 DIAGNOSIS — Z800 Family history of malignant neoplasm of digestive organs: Secondary | ICD-10-CM

## 2016-05-18 DIAGNOSIS — I69354 Hemiplegia and hemiparesis following cerebral infarction affecting left non-dominant side: Secondary | ICD-10-CM

## 2016-05-18 DIAGNOSIS — R778 Other specified abnormalities of plasma proteins: Secondary | ICD-10-CM

## 2016-05-18 DIAGNOSIS — E876 Hypokalemia: Secondary | ICD-10-CM | POA: Diagnosis present

## 2016-05-18 DIAGNOSIS — G4733 Obstructive sleep apnea (adult) (pediatric): Secondary | ICD-10-CM | POA: Diagnosis present

## 2016-05-18 DIAGNOSIS — N390 Urinary tract infection, site not specified: Secondary | ICD-10-CM | POA: Diagnosis present

## 2016-05-18 DIAGNOSIS — Z87440 Personal history of urinary (tract) infections: Secondary | ICD-10-CM

## 2016-05-18 DIAGNOSIS — R9401 Abnormal electroencephalogram [EEG]: Secondary | ICD-10-CM | POA: Diagnosis present

## 2016-05-18 DIAGNOSIS — R7989 Other specified abnormal findings of blood chemistry: Secondary | ICD-10-CM

## 2016-05-18 DIAGNOSIS — M4854XA Collapsed vertebra, not elsewhere classified, thoracic region, initial encounter for fracture: Secondary | ICD-10-CM | POA: Diagnosis present

## 2016-05-18 DIAGNOSIS — G4089 Other seizures: Secondary | ICD-10-CM | POA: Diagnosis present

## 2016-05-18 DIAGNOSIS — E119 Type 2 diabetes mellitus without complications: Secondary | ICD-10-CM | POA: Diagnosis present

## 2016-05-18 DIAGNOSIS — F419 Anxiety disorder, unspecified: Secondary | ICD-10-CM | POA: Diagnosis present

## 2016-05-18 DIAGNOSIS — E785 Hyperlipidemia, unspecified: Secondary | ICD-10-CM | POA: Diagnosis present

## 2016-05-18 DIAGNOSIS — Z885 Allergy status to narcotic agent status: Secondary | ICD-10-CM

## 2016-05-18 NOTE — ED Triage Notes (Signed)
Pt was sleeping and had a seizure when EMS arrived she was unresponsive, upon route she became responsive but then had another seizure that lasted 1 minute, was given versed which stopped the seizure.  Hx of CVA, Diabeties and seizures.

## 2016-05-19 ENCOUNTER — Inpatient Hospital Stay (HOSPITAL_COMMUNITY): Payer: Medicare Other

## 2016-05-19 ENCOUNTER — Encounter (HOSPITAL_COMMUNITY): Payer: Self-pay | Admitting: Emergency Medicine

## 2016-05-19 ENCOUNTER — Emergency Department (HOSPITAL_COMMUNITY): Payer: Medicare Other

## 2016-05-19 DIAGNOSIS — C8591 Non-Hodgkin lymphoma, unspecified, lymph nodes of head, face, and neck: Secondary | ICD-10-CM | POA: Diagnosis not present

## 2016-05-19 DIAGNOSIS — G4733 Obstructive sleep apnea (adult) (pediatric): Secondary | ICD-10-CM | POA: Diagnosis not present

## 2016-05-19 DIAGNOSIS — F17210 Nicotine dependence, cigarettes, uncomplicated: Secondary | ICD-10-CM | POA: Diagnosis present

## 2016-05-19 DIAGNOSIS — G4089 Other seizures: Secondary | ICD-10-CM | POA: Diagnosis not present

## 2016-05-19 DIAGNOSIS — A499 Bacterial infection, unspecified: Secondary | ICD-10-CM | POA: Diagnosis not present

## 2016-05-19 DIAGNOSIS — R569 Unspecified convulsions: Secondary | ICD-10-CM | POA: Diagnosis not present

## 2016-05-19 DIAGNOSIS — I10 Essential (primary) hypertension: Secondary | ICD-10-CM | POA: Diagnosis not present

## 2016-05-19 DIAGNOSIS — R51 Headache: Secondary | ICD-10-CM | POA: Diagnosis present

## 2016-05-19 DIAGNOSIS — C7931 Secondary malignant neoplasm of brain: Secondary | ICD-10-CM | POA: Diagnosis not present

## 2016-05-19 DIAGNOSIS — M4854XA Collapsed vertebra, not elsewhere classified, thoracic region, initial encounter for fracture: Secondary | ICD-10-CM | POA: Diagnosis not present

## 2016-05-19 DIAGNOSIS — Z87440 Personal history of urinary (tract) infections: Secondary | ICD-10-CM | POA: Diagnosis not present

## 2016-05-19 DIAGNOSIS — E119 Type 2 diabetes mellitus without complications: Secondary | ICD-10-CM | POA: Diagnosis present

## 2016-05-19 DIAGNOSIS — J90 Pleural effusion, not elsewhere classified: Secondary | ICD-10-CM | POA: Diagnosis not present

## 2016-05-19 DIAGNOSIS — N390 Urinary tract infection, site not specified: Secondary | ICD-10-CM | POA: Insufficient documentation

## 2016-05-19 DIAGNOSIS — C8339 Diffuse large B-cell lymphoma, extranodal and solid organ sites: Secondary | ICD-10-CM | POA: Diagnosis not present

## 2016-05-19 DIAGNOSIS — K5730 Diverticulosis of large intestine without perforation or abscess without bleeding: Secondary | ICD-10-CM | POA: Diagnosis not present

## 2016-05-19 DIAGNOSIS — E7800 Pure hypercholesterolemia, unspecified: Secondary | ICD-10-CM | POA: Diagnosis present

## 2016-05-19 DIAGNOSIS — R0600 Dyspnea, unspecified: Secondary | ICD-10-CM | POA: Diagnosis not present

## 2016-05-19 DIAGNOSIS — E876 Hypokalemia: Secondary | ICD-10-CM | POA: Diagnosis present

## 2016-05-19 DIAGNOSIS — D496 Neoplasm of unspecified behavior of brain: Secondary | ICD-10-CM | POA: Diagnosis not present

## 2016-05-19 DIAGNOSIS — Z7984 Long term (current) use of oral hypoglycemic drugs: Secondary | ICD-10-CM | POA: Diagnosis not present

## 2016-05-19 DIAGNOSIS — R748 Abnormal levels of other serum enzymes: Secondary | ICD-10-CM | POA: Diagnosis not present

## 2016-05-19 DIAGNOSIS — Z452 Encounter for adjustment and management of vascular access device: Secondary | ICD-10-CM | POA: Diagnosis not present

## 2016-05-19 DIAGNOSIS — I636 Cerebral infarction due to cerebral venous thrombosis, nonpyogenic: Secondary | ICD-10-CM | POA: Diagnosis not present

## 2016-05-19 DIAGNOSIS — X58XXXA Exposure to other specified factors, initial encounter: Secondary | ICD-10-CM | POA: Diagnosis present

## 2016-05-19 DIAGNOSIS — E785 Hyperlipidemia, unspecified: Secondary | ICD-10-CM | POA: Diagnosis not present

## 2016-05-19 DIAGNOSIS — R938 Abnormal findings on diagnostic imaging of other specified body structures: Secondary | ICD-10-CM | POA: Diagnosis not present

## 2016-05-19 DIAGNOSIS — E118 Type 2 diabetes mellitus with unspecified complications: Secondary | ICD-10-CM | POA: Diagnosis not present

## 2016-05-19 DIAGNOSIS — K219 Gastro-esophageal reflux disease without esophagitis: Secondary | ICD-10-CM | POA: Diagnosis present

## 2016-05-19 DIAGNOSIS — Z7951 Long term (current) use of inhaled steroids: Secondary | ICD-10-CM | POA: Diagnosis not present

## 2016-05-19 DIAGNOSIS — J449 Chronic obstructive pulmonary disease, unspecified: Secondary | ICD-10-CM | POA: Diagnosis not present

## 2016-05-19 DIAGNOSIS — G9389 Other specified disorders of brain: Secondary | ICD-10-CM | POA: Diagnosis not present

## 2016-05-19 DIAGNOSIS — I69354 Hemiplegia and hemiparesis following cerebral infarction affecting left non-dominant side: Secondary | ICD-10-CM | POA: Diagnosis not present

## 2016-05-19 DIAGNOSIS — Z515 Encounter for palliative care: Secondary | ICD-10-CM | POA: Diagnosis not present

## 2016-05-19 DIAGNOSIS — I5042 Chronic combined systolic (congestive) and diastolic (congestive) heart failure: Secondary | ICD-10-CM | POA: Diagnosis not present

## 2016-05-19 DIAGNOSIS — C8590 Non-Hodgkin lymphoma, unspecified, unspecified site: Secondary | ICD-10-CM | POA: Diagnosis not present

## 2016-05-19 DIAGNOSIS — Z7982 Long term (current) use of aspirin: Secondary | ICD-10-CM | POA: Diagnosis not present

## 2016-05-19 DIAGNOSIS — I110 Hypertensive heart disease with heart failure: Secondary | ICD-10-CM | POA: Diagnosis present

## 2016-05-19 DIAGNOSIS — G936 Cerebral edema: Secondary | ICD-10-CM | POA: Diagnosis present

## 2016-05-19 LAB — GLUCOSE, CAPILLARY
GLUCOSE-CAPILLARY: 106 mg/dL — AB (ref 65–99)
Glucose-Capillary: 111 mg/dL — ABNORMAL HIGH (ref 65–99)
Glucose-Capillary: 123 mg/dL — ABNORMAL HIGH (ref 65–99)
Glucose-Capillary: 125 mg/dL — ABNORMAL HIGH (ref 65–99)

## 2016-05-19 LAB — I-STAT CG4 LACTIC ACID, ED: LACTIC ACID, VENOUS: 5.82 mmol/L — AB (ref 0.5–1.9)

## 2016-05-19 LAB — COMPREHENSIVE METABOLIC PANEL
ALT: 13 U/L — AB (ref 14–54)
AST: 22 U/L (ref 15–41)
Albumin: 3.6 g/dL (ref 3.5–5.0)
Alkaline Phosphatase: 79 U/L (ref 38–126)
Anion gap: 14 (ref 5–15)
BUN: 22 mg/dL — ABNORMAL HIGH (ref 6–20)
CHLORIDE: 102 mmol/L (ref 101–111)
CO2: 21 mmol/L — AB (ref 22–32)
CREATININE: 1.05 mg/dL — AB (ref 0.44–1.00)
Calcium: 9.4 mg/dL (ref 8.9–10.3)
GFR calc non Af Amer: 53 mL/min — ABNORMAL LOW (ref >60)
Glucose, Bld: 197 mg/dL — ABNORMAL HIGH (ref 65–99)
Potassium: 2.8 mmol/L — ABNORMAL LOW (ref 3.5–5.1)
SODIUM: 137 mmol/L (ref 135–145)
Total Bilirubin: 0.4 mg/dL (ref 0.3–1.2)
Total Protein: 6.8 g/dL (ref 6.5–8.1)

## 2016-05-19 LAB — URINALYSIS, ROUTINE W REFLEX MICROSCOPIC
Bilirubin Urine: NEGATIVE
Glucose, UA: NEGATIVE mg/dL
Ketones, ur: NEGATIVE mg/dL
LEUKOCYTES UA: NEGATIVE
Nitrite: POSITIVE — AB
PROTEIN: NEGATIVE mg/dL
Specific Gravity, Urine: 1.019 (ref 1.005–1.030)
pH: 6 (ref 5.0–8.0)

## 2016-05-19 LAB — RAPID URINE DRUG SCREEN, HOSP PERFORMED
Amphetamines: NOT DETECTED
BARBITURATES: NOT DETECTED
BENZODIAZEPINES: POSITIVE — AB
Cocaine: NOT DETECTED
Opiates: NOT DETECTED
TETRAHYDROCANNABINOL: NOT DETECTED

## 2016-05-19 LAB — CBC WITH DIFFERENTIAL/PLATELET
Basophils Absolute: 0 10*3/uL (ref 0.0–0.1)
Basophils Relative: 0 %
EOS PCT: 0 %
Eosinophils Absolute: 0 10*3/uL (ref 0.0–0.7)
HCT: 37 % (ref 36.0–46.0)
Hemoglobin: 12.9 g/dL (ref 12.0–15.0)
LYMPHS ABS: 1.6 10*3/uL (ref 0.7–4.0)
LYMPHS PCT: 12 %
MCH: 29.4 pg (ref 26.0–34.0)
MCHC: 34.9 g/dL (ref 30.0–36.0)
MCV: 84.3 fL (ref 78.0–100.0)
MONO ABS: 0.7 10*3/uL (ref 0.1–1.0)
MONOS PCT: 5 %
Neutro Abs: 11.1 10*3/uL — ABNORMAL HIGH (ref 1.7–7.7)
Neutrophils Relative %: 83 %
PLATELETS: 237 10*3/uL (ref 150–400)
RBC: 4.39 MIL/uL (ref 3.87–5.11)
RDW: 12.6 % (ref 11.5–15.5)
WBC: 13.4 10*3/uL — ABNORMAL HIGH (ref 4.0–10.5)

## 2016-05-19 LAB — BASIC METABOLIC PANEL
Anion gap: 22 — ABNORMAL HIGH (ref 5–15)
Anion gap: 9 (ref 5–15)
BUN: 19 mg/dL (ref 6–20)
BUN: 23 mg/dL — AB (ref 6–20)
CALCIUM: 9.2 mg/dL (ref 8.9–10.3)
CALCIUM: 9.7 mg/dL (ref 8.9–10.3)
CO2: 17 mmol/L — ABNORMAL LOW (ref 22–32)
CO2: 27 mmol/L (ref 22–32)
CREATININE: 1.1 mg/dL — AB (ref 0.44–1.00)
Chloride: 100 mmol/L — ABNORMAL LOW (ref 101–111)
Chloride: 103 mmol/L (ref 101–111)
Creatinine, Ser: 0.91 mg/dL (ref 0.44–1.00)
GFR calc Af Amer: 58 mL/min — ABNORMAL LOW (ref >60)
GFR calc Af Amer: >60 mL/min (ref >60)
GFR, EST NON AFRICAN AMERICAN: 50 mL/min — AB (ref >60)
GLUCOSE: 134 mg/dL — AB (ref 65–99)
GLUCOSE: 180 mg/dL — AB (ref 65–99)
Potassium: 3.8 mmol/L (ref 3.5–5.1)
Potassium: 2.9 mmol/L — ABNORMAL LOW (ref 3.5–5.1)
Sodium: 139 mmol/L (ref 135–145)
Sodium: 139 mmol/L (ref 135–145)

## 2016-05-19 LAB — CBC
HCT: 42.7 % (ref 36.0–46.0)
Hemoglobin: 14.4 g/dL (ref 12.0–15.0)
MCH: 29.9 pg (ref 26.0–34.0)
MCHC: 33.7 g/dL (ref 30.0–36.0)
MCV: 88.8 fL (ref 78.0–100.0)
PLATELETS: 274 10*3/uL (ref 150–400)
RBC: 4.81 MIL/uL (ref 3.87–5.11)
RDW: 13.2 % (ref 11.5–15.5)
WBC: 13.9 10*3/uL — ABNORMAL HIGH (ref 4.0–10.5)

## 2016-05-19 LAB — URINE MICROSCOPIC-ADD ON: WBC UA: NONE SEEN WBC/hpf (ref 0–5)

## 2016-05-19 LAB — LACTIC ACID, PLASMA
Lactic Acid, Venous: 5.4 mmol/L (ref 0.5–1.9)
Lactic Acid, Venous: 2.8 mmol/L (ref 0.5–1.9)
Lactic Acid, Venous: 1.3 mmol/L (ref 0.5–1.9)
Lactic Acid, Venous: 2.2 mmol/L (ref 0.5–1.9)

## 2016-05-19 LAB — CK TOTAL AND CKMB (NOT AT ARMC)
CK TOTAL: 52 U/L (ref 38–234)
CK, MB: 2.1 ng/mL (ref 0.5–5.0)
Relative Index: INVALID (ref 0.0–2.5)

## 2016-05-19 LAB — HEPATIC FUNCTION PANEL
ALBUMIN: 3.5 g/dL (ref 3.5–5.0)
ALK PHOS: 78 U/L (ref 38–126)
ALT: 13 U/L — AB (ref 14–54)
AST: 22 U/L (ref 15–41)
Bilirubin, Direct: 0.1 mg/dL (ref 0.1–0.5)
Indirect Bilirubin: 0.5 mg/dL (ref 0.3–0.9)
TOTAL PROTEIN: 6.4 g/dL — AB (ref 6.5–8.1)
Total Bilirubin: 0.6 mg/dL (ref 0.3–1.2)

## 2016-05-19 LAB — ACETAMINOPHEN LEVEL: Acetaminophen (Tylenol), Serum: <10 ug/mL — ABNORMAL LOW (ref 10–30)

## 2016-05-19 LAB — MAGNESIUM: Magnesium: 1.9 mg/dL (ref 1.7–2.4)

## 2016-05-19 LAB — TROPONIN I
TROPONIN I: 0.76 ng/mL — AB (ref <0.03)
Troponin I: 1.21 ng/mL (ref <0.03)
Troponin I: 1.59 ng/mL (ref <0.03)

## 2016-05-19 LAB — CBG MONITORING, ED: GLUCOSE-CAPILLARY: 147 mg/dL — AB (ref 65–99)

## 2016-05-19 LAB — SALICYLATE LEVEL

## 2016-05-19 LAB — MRSA PCR SCREENING: MRSA BY PCR: NEGATIVE

## 2016-05-19 MED ORDER — CITALOPRAM HYDROBROMIDE 20 MG PO TABS
20.0000 mg | ORAL_TABLET | Freq: Every day | ORAL | Status: DC
  Administered 2016-05-20 – 2016-05-28 (×7): 20 mg via ORAL
  Filled 2016-05-19: qty 2
  Filled 2016-05-19: qty 1
  Filled 2016-05-19 (×2): qty 2
  Filled 2016-05-19 (×5): qty 1
  Filled 2016-05-19: qty 2

## 2016-05-19 MED ORDER — SODIUM CHLORIDE 0.9 % IV SOLN
INTRAVENOUS | Status: AC
  Administered 2016-05-19: 06:00:00 via INTRAVENOUS

## 2016-05-19 MED ORDER — SODIUM CHLORIDE 0.9 % IV SOLN
1000.0000 mg | Freq: Once | INTRAVENOUS | Status: AC
  Administered 2016-05-19: 1000 mg via INTRAVENOUS
  Filled 2016-05-19: qty 10

## 2016-05-19 MED ORDER — POTASSIUM CHLORIDE CRYS ER 20 MEQ PO TBCR
20.0000 meq | EXTENDED_RELEASE_TABLET | Freq: Once | ORAL | Status: AC
  Administered 2016-05-19: 20 meq via ORAL
  Filled 2016-05-19: qty 1

## 2016-05-19 MED ORDER — TOPIRAMATE 25 MG PO TABS
50.0000 mg | ORAL_TABLET | Freq: Two times a day (BID) | ORAL | Status: DC
  Administered 2016-05-19 – 2016-05-27 (×14): 50 mg via ORAL
  Filled 2016-05-19 (×17): qty 2

## 2016-05-19 MED ORDER — PANTOPRAZOLE SODIUM 40 MG PO TBEC
40.0000 mg | DELAYED_RELEASE_TABLET | Freq: Every day | ORAL | Status: DC
  Administered 2016-05-20 – 2016-05-28 (×7): 40 mg via ORAL
  Filled 2016-05-19 (×8): qty 1

## 2016-05-19 MED ORDER — POTASSIUM CHLORIDE 10 MEQ/100ML IV SOLN
10.0000 meq | INTRAVENOUS | Status: DC
  Administered 2016-05-19 (×3): 10 meq via INTRAVENOUS
  Filled 2016-05-19 (×4): qty 100

## 2016-05-19 MED ORDER — FLUTICASONE PROPIONATE 50 MCG/ACT NA SUSP
1.0000 | Freq: Two times a day (BID) | NASAL | Status: DC
  Administered 2016-05-19 – 2016-05-20 (×3): 2 via NASAL
  Administered 2016-05-21: 1 via NASAL
  Administered 2016-05-22 (×2): 2 via NASAL
  Administered 2016-05-23: 1 via NASAL
  Administered 2016-05-23: 2 via NASAL
  Administered 2016-05-24: 1 via NASAL
  Administered 2016-05-24 – 2016-05-27 (×5): 2 via NASAL
  Filled 2016-05-19 (×3): qty 16

## 2016-05-19 MED ORDER — HYDRALAZINE HCL 20 MG/ML IJ SOLN: 10.0000 mg | INTRAMUSCULAR | Status: DC | PRN

## 2016-05-19 MED ORDER — ATORVASTATIN CALCIUM 10 MG PO TABS
20.0000 mg | ORAL_TABLET | Freq: Every day | ORAL | Status: DC
  Administered 2016-05-19 – 2016-05-27 (×9): 20 mg via ORAL
  Filled 2016-05-19 (×3): qty 1
  Filled 2016-05-19 (×2): qty 2
  Filled 2016-05-19 (×3): qty 1
  Filled 2016-05-19: qty 2
  Filled 2016-05-19: qty 1

## 2016-05-19 MED ORDER — ASPIRIN EC 81 MG PO TBEC
81.0000 mg | DELAYED_RELEASE_TABLET | Freq: Every day | ORAL | Status: DC
  Administered 2016-05-20: 81 mg via ORAL
  Filled 2016-05-19 (×2): qty 1

## 2016-05-19 MED ORDER — DEXTROSE 5 % IV SOLN
1.0000 g | INTRAVENOUS | Status: AC
  Administered 2016-05-20: 1 g via INTRAVENOUS
  Filled 2016-05-19 (×3): qty 10

## 2016-05-19 MED ORDER — POTASSIUM CHLORIDE 10 MEQ/100ML IV SOLN
10.0000 meq | INTRAVENOUS | Status: AC
  Administered 2016-05-19 (×2): 10 meq via INTRAVENOUS
  Filled 2016-05-19: qty 100

## 2016-05-19 MED ORDER — ALBUTEROL SULFATE HFA 108 (90 BASE) MCG/ACT IN AERS: 2.0000 | INHALATION_SPRAY | Freq: Four times a day (QID) | RESPIRATORY_TRACT | Status: DC | PRN

## 2016-05-19 MED ORDER — DEXTROSE 5 % IV SOLN
1.0000 g | Freq: Once | INTRAVENOUS | Status: AC
  Administered 2016-05-19: 1 g via INTRAVENOUS
  Filled 2016-05-19: qty 10

## 2016-05-19 MED ORDER — MOMETASONE FURO-FORMOTEROL FUM 200-5 MCG/ACT IN AERO: 2.0000 | INHALATION_SPRAY | Freq: Two times a day (BID) | RESPIRATORY_TRACT | Status: DC

## 2016-05-19 MED ORDER — INSULIN ASPART 100 UNIT/ML ~~LOC~~ SOLN
0.0000 [IU] | Freq: Three times a day (TID) | SUBCUTANEOUS | Status: DC
  Administered 2016-05-19 – 2016-05-21 (×4): 1 [IU] via SUBCUTANEOUS
  Administered 2016-05-22: 2 [IU] via SUBCUTANEOUS
  Administered 2016-05-22: 1 [IU] via SUBCUTANEOUS
  Administered 2016-05-22 – 2016-05-24 (×3): 2 [IU] via SUBCUTANEOUS
  Administered 2016-05-25 – 2016-05-26 (×3): 1 [IU] via SUBCUTANEOUS
  Administered 2016-05-27: 2 [IU] via SUBCUTANEOUS
  Administered 2016-05-27 (×2): 3 [IU] via SUBCUTANEOUS
  Administered 2016-05-28: 2 [IU] via SUBCUTANEOUS

## 2016-05-19 MED ORDER — SODIUM CHLORIDE 0.9 % IV SOLN
Freq: Once | INTRAVENOUS | Status: AC
  Administered 2016-05-19: 02:00:00 via INTRAVENOUS

## 2016-05-19 MED ORDER — SODIUM CHLORIDE 0.9 % IV SOLN
INTRAVENOUS | Status: DC
  Administered 2016-05-19: 03:00:00 via INTRAVENOUS

## 2016-05-19 MED ORDER — ENOXAPARIN SODIUM 40 MG/0.4ML ~~LOC~~ SOLN
40.0000 mg | SUBCUTANEOUS | Status: DC
  Administered 2016-05-19 – 2016-05-25 (×7): 40 mg via SUBCUTANEOUS
  Filled 2016-05-19 (×6): qty 0.4

## 2016-05-19 MED ORDER — ALPRAZOLAM 0.5 MG PO TABS: 0.5000 mg | ORAL_TABLET | Freq: Every day | ORAL | Status: DC | PRN

## 2016-05-19 MED ORDER — ALBUTEROL SULFATE (2.5 MG/3ML) 0.083% IN NEBU: 2.5000 mg | INHALATION_SOLUTION | Freq: Three times a day (TID) | RESPIRATORY_TRACT | Status: DC | PRN

## 2016-05-19 MED ORDER — LORAZEPAM 2 MG/ML IJ SOLN
1.0000 mg | INTRAMUSCULAR | Status: DC | PRN
  Administered 2016-05-24: 1 mg via INTRAVENOUS
  Filled 2016-05-19: qty 1

## 2016-05-19 MED ORDER — KETOROLAC TROMETHAMINE 10 MG PO TABS
10.0000 mg | ORAL_TABLET | Freq: Four times a day (QID) | ORAL | Status: AC | PRN
  Administered 2016-05-19 – 2016-05-24 (×7): 10 mg via ORAL
  Filled 2016-05-19 (×8): qty 1

## 2016-05-19 MED ORDER — ACETAMINOPHEN 325 MG PO TABS
650.0000 mg | ORAL_TABLET | Freq: Four times a day (QID) | ORAL | Status: DC | PRN
  Administered 2016-05-19 – 2016-05-25 (×5): 650 mg via ORAL
  Filled 2016-05-19 (×6): qty 2

## 2016-05-19 MED ORDER — POTASSIUM CHLORIDE 10 MEQ/100ML IV SOLN
10.0000 meq | INTRAVENOUS | Status: DC
  Administered 2016-05-19: 10 meq via INTRAVENOUS
  Filled 2016-05-19: qty 100

## 2016-05-19 NOTE — ED Notes (Signed)
Paged admitting MD

## 2016-05-19 NOTE — ED Notes (Signed)
Escorted patient to CT and back without difficulty. Pt verbally responsive at this time.

## 2016-05-19 NOTE — ED Notes (Signed)
Kristi Kramer (daughter) contact info [phone] (home) [phone] (cell) is going home, please call if there are problems.

## 2016-05-19 NOTE — ED Notes (Signed)
Patient still out of the department for testing
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
## 2016-05-19 NOTE — Progress Notes (Signed)
If EEG negative and MRI non-revealing for stroke. Continue AED and follow up with neurology for seizure.   Etta Quill PA-C [facility] [phone]  M-F  (am- 4 PM)  05/19/2016, 10:45 AM

## 2016-05-19 NOTE — ED Notes (Signed)
Patient was in MRI and unable to tolerate. Admitting MD was paged twice with no response. MRI sent patient back to ED RM. Now patient sent to EEG.

## 2016-05-19 NOTE — ED Notes (Signed)
Patient transported to MRI 

## 2016-05-19 NOTE — ED Notes (Signed)
Requested sec. Page attending to report Trop. level

## 2016-05-19 NOTE — H&P (Signed)
History and Physical    Kristi Kramer TDS:[id number] DOB: 10-03-1945 DOA: 05/18/2016  PCP: Cyndi Bender, PA-C  Patient coming from: Home.  Chief Complaint: Seizure.  History obtained from previous records ER physician, patient's nurse as patient is confused and patient's family not available at this time.  HPI: Kristi Kramer is a 70 y.o. female with history of CVA with left-sided weakness, hypertension, diabetes mellitus type 2, COPD, sleep apnea was brought to the ER after patient had a witnessed seizure. Patient's boyfriend saw the patient had tonic-clonic seizure. EMS was called and after EMS arrival patient had another tonic-clonic seizure and was given Midazolam. Patient was postictal in the ER on arrival. CT head is unremarkable. Neurologist was consulted and patient was placed on loading dose of Keppra. After neurological evaluation patient was placed on increased dose of Topamax which patient usually takes for headaches. On exam patient is still post ictal but follows commands and moves all extremities. Pupils are reacting. No further neurological exam possible since patient is still postictal.   ED Course: CT head is unremarkable. Neurology was consulted and patient was placed on Keppra loading dose but neurology has recommended increasing the Topamax dose.  Review of Systems: As per HPI, rest all negative.   Past Medical History:  Diagnosis Date  . Anxiety   . Arthritis   . COPD (chronic obstructive pulmonary disease) (HCC)    sarcoidosis  . Diabetes mellitus   . GERD (gastroesophageal reflux disease)   . High cholesterol   . Hypertension   . Shortness of breath dyspnea   . Sleep apnea    cpap  . Stroke ([place])    x2 12, 16    Past Surgical History:  Procedure Laterality Date  . ABDOMINAL HYSTERECTOMY    . BACK SURGERY    . CHOLECYSTECTOMY    . EAR EXAMINATION UNDER ANESTHESIA Left    plate  . NECK SURGERY       reports that she has been smoking Cigarettes.   She has a 5.50 pack-year smoking history. She has never used smokeless tobacco. She reports that she does not drink alcohol or use drugs.  Allergies  Allergen Reactions  . Mometasone Anaphylaxis and Rash    Turns real red Turns Red  . Codeine Nausea And Vomiting  . Sulfa Antibiotics Nausea And Vomiting    Family History  Problem Relation Age of Onset  . Liver cancer Father     Prior to Admission medications   Medication Sig Start Date End Date Taking? Authorizing Provider  ADVAIR DISKUS 250-50 MCG/DOSE AEPB Inhale 1 puff into the lungs 2 (two) times daily. 11/27/15  Yes Historical Provider, MD  albuterol (PROVENTIL) (2.5 MG/3ML) 0.083% nebulizer solution Take 2.5 mg by nebulization 3 (three) times daily as needed for wheezing or shortness of breath.   Yes Historical Provider, MD  ALPRAZolam Duanne Moron) 0.5 MG tablet Take 1 tablet (0.5 mg total) by mouth daily as needed. For anxiety. 12/28/15  Yes Lavina Hamman, MD  aspirin EC 81 MG tablet Take 81 mg by mouth daily.    Yes Historical Provider, MD  atorvastatin (LIPITOR) 20 MG tablet Take 20 mg by mouth daily.  06/02/15  Yes Historical Provider, MD  citalopram (CELEXA) 20 MG tablet Take 20 mg by mouth daily.   Yes Historical Provider, MD  esomeprazole (NEXIUM) 20 MG capsule Take 40 mg by mouth daily at 12 noon.    Yes Historical Provider, MD  fluticasone (FLONASE) 50 MCG/ACT  nasal spray Place 1-2 sprays into both nostrils 2 (two) times daily.   Yes Historical Provider, MD  hydrochlorothiazide (HYDRODIURIL) 25 MG tablet Take 25 mg by mouth daily.   Yes Historical Provider, MD  lidocaine (LIDODERM) 5 % Place 1 patch onto the skin daily as needed (pain). Remove & Discard patch within 12 hours or as directed by MD   Yes Historical Provider, MD  meloxicam (MOBIC) 7.5 MG tablet Take 7.5 mg by mouth daily.   Yes Historical Provider, MD  metFORMIN (GLUCOPHAGE) 1000 MG tablet Take 1,000 mg by mouth 2 (two) times daily with a meal.  06/02/15  Yes  Historical Provider, MD  phenazopyridine (PYRIDIUM) 100 MG tablet Take 1 tablet (100 mg total) by mouth 3 (three) times daily as needed for pain. 12/28/15  Yes Lavina Hamman, MD  promethazine (PHENERGAN) 12.5 MG tablet Take 12.5 mg by mouth every 6 (six) hours as needed for nausea or vomiting.   Yes Historical Provider, MD  topiramate (TOPAMAX) 50 MG tablet Take 1 tablet (50 mg total) by mouth daily. Patient taking differently: Take 50 mg by mouth daily as needed (headaches).  03/11/16  Yes Adam Telford Nab, DO  traMADol (ULTRAM) 50 MG tablet Take 50 mg by mouth every 6 (six) hours as needed for moderate pain.   Yes Historical Provider, MD  VENTOLIN HFA 108 (90 BASE) MCG/ACT inhaler Inhale 2 puffs into the lungs every 6 (six) hours as needed. wheezing 04/10/15  Yes Historical Provider, MD  Vitamin D, Ergocalciferol, (DRISDOL) 50000 units CAPS capsule Take 50,000 Units by mouth every 7 (seven) days.   Yes Historical Provider, MD    Physical Exam: Vitals:   05/19/16 0330 05/19/16 0345 05/19/16 0400 05/19/16 0415  BP: 132/75 133/75 121/79 143/84  Pulse: 75 79 82 69  Resp: '12 22 16 19  '$ Temp:      TempSrc:      SpO2: 96% 97% 99% 95%      Constitutional: Moderately built and nourished. Vitals:   05/19/16 0330 05/19/16 0345 05/19/16 0400 05/19/16 0415  BP: 132/75 133/75 121/79 143/84  Pulse: 75 79 82 69  Resp: '12 22 16 19  '$ Temp:      TempSrc:      SpO2: 96% 97% 99% 95%   Eyes: Anicteric no pallor. ENMT: No discharge from the ears eyes nose and mouth. Neck: No neck rigidity. No mass felt. No JVD appreciated. Respiratory: No rhonchi or crepitations. Cardiovascular: S1 and S2 heard no murmurs appreciated. Abdomen: Soft nontender bowel sounds present. Musculoskeletal: No edema. No joint effusion. Skin: No rash. Skin appears warm. Neurologic: Patient is drowsy but arousable and follows commands and moves all extremities. Perla positive. Psychiatric: Patient is drowsy.   Labs on Admission: I  have personally reviewed following labs and imaging studies  CBC:  Recent Labs Lab 05/19/16 0014  WBC 13.9*  HGB 14.4  HCT 42.7  MCV 88.8  PLT 242   Basic Metabolic Panel:  Recent Labs Lab 05/19/16 0014 05/19/16 0051 05/19/16 0310  NA 139 137  --   K 3.8 2.8*  --   CL 100* 102  --   CO2 17* 21*  --   GLUCOSE 180* 197*  --   BUN 23* 22*  --   CREATININE 1.10* 1.05*  --   CALCIUM 9.7 9.4  --   MG  --   --  1.9   GFR: CrCl cannot be calculated (Unknown ideal weight.). Liver Function Tests:  Recent Labs  Lab 05/19/16 0051  AST 22  ALT 13*  ALKPHOS 79  BILITOT 0.4  PROT 6.8  ALBUMIN 3.6   No results for input(s): LIPASE, AMYLASE in the last 168 hours. No results for input(s): AMMONIA in the last 168 hours. Coagulation Profile: No results for input(s): INR, PROTIME in the last 168 hours. Cardiac Enzymes:  Recent Labs Lab 05/19/16 0031  CKTOTAL 52  CKMB 2.1   BNP (last 3 results) No results for input(s): PROBNP in the last 8760 hours. HbA1C: No results for input(s): HGBA1C in the last 72 hours. CBG:  Recent Labs Lab 05/19/16 0310  GLUCAP 147*   Lipid Profile: No results for input(s): CHOL, HDL, LDLCALC, TRIG, CHOLHDL, LDLDIRECT in the last 72 hours. Thyroid Function Tests: No results for input(s): TSH, T4TOTAL, FREET4, T3FREE, THYROIDAB in the last 72 hours. Anemia Panel: No results for input(s): VITAMINB12, FOLATE, FERRITIN, TIBC, IRON, RETICCTPCT in the last 72 hours. Urine analysis:    Component Value Date/Time   COLORURINE YELLOW 05/19/2016 0245   APPEARANCEUR CLOUDY (A) 05/19/2016 0245   LABSPEC 1.019 05/19/2016 0245   PHURINE 6.0 05/19/2016 0245   GLUCOSEU NEGATIVE 05/19/2016 0245   HGBUR SMALL (A) 05/19/2016 0245   BILIRUBINUR NEGATIVE 05/19/2016 0245   KETONESUR NEGATIVE 05/19/2016 0245   PROTEINUR NEGATIVE 05/19/2016 0245   UROBILINOGEN 0.2 05/31/2015 1420   NITRITE POSITIVE (A) 05/19/2016 0245   LEUKOCYTESUR NEGATIVE 05/19/2016  0245   Sepsis Labs: '@LABRCNTIP'$ (procalcitonin:4,lacticidven:4) )No results found for this or any previous visit (from the past 240 hour(s)).   Radiological Exams on Admission: Ct Head Wo Contrast  Result Date: 05/19/2016 CLINICAL DATA:  Multiple seizures, with lethargy, acute onset. Initial encounter. EXAM: CT HEAD WITHOUT CONTRAST TECHNIQUE: Contiguous axial images were obtained from the base of the skull through the vertex without intravenous contrast. COMPARISON:  CT of the head performed 12/26/2015, and MRI of the brain performed 09/24/2015 FINDINGS: Brain: No evidence of acute infarction, hemorrhage, hydrocephalus, extra-axial collection or mass lesion/mass effect. Small chronic infarcts are noted at the high parietal lobes bilaterally. Mild periventricular white matter change likely reflects small vessel ischemic microangiopathy. Mild chronic ischemic change is noted at the right basal ganglia. The brainstem and fourth ventricle are within normal limits. The cerebral hemispheres demonstrate grossly normal gray-white differentiation. No mass effect or midline shift is seen. Vascular: No hyperdense vessel or unexpected calcification. Skull: There is no evidence of fracture; visualized osseous structures are unremarkable in appearance. Sinuses/Orbits: The orbits are within normal limits. The patient is status post left-sided mastoidectomy. There is partial opacification of the right mastoid air cells. The paranasal sinuses are well-aerated. Other: No significant soft tissue abnormalities are seen. IMPRESSION: 1. No acute intracranial pathology seen on CT. 2. Small chronic infarcts at the high parietal lobes bilaterally. 3. Mild small vessel ischemic microangiopathy. Mild chronic ischemic change at the right basal ganglia. 4. Partial opacification of the right mastoid air cells. Electronically Signed   By: Garald Balding M.D.   On: 05/19/2016 01:38    EKG: Independently reviewed. Normal sinus  rhythm.  Assessment/Plan Principal Problem:   Seizures ([place]) Active Problems:   Type 2 diabetes mellitus (HCC)   Essential hypertension   OSA (obstructive sleep apnea)   Seizure ([place])    1. Seizures, still postictal - appreciate neurology consult and recommendations. Patient is placed on increased dose of Topamax. Please see neurology recommendations for Topamax dosing. When necessary Ativan for any seizures. MRI brain and EEG are pending. 2. Elevated lactate levels  probably from seizures - continue hydration check troponin. 3. Obstructive sleep apnea on CPAP. 4. Hypertension - since patient's lactate levels are elevated will hold off hydrochlorothiazide and keep patient on when necessary IV hydralazine. 5. Hypokalemia probably from diuretics - replace and recheck. Magnesium levels are normal. 6. Diabetes mellitus type 2 - since lactate levels are elevated will hold off metformin. Patient is placed on sliding scale coverage. 7. COPD - presently not wheezing. Continue inhalers.   DVT prophylaxis: Lovenox. Code Status: Full code.  Family Communication: No family at the bedside.  Disposition Plan: Home.  Consults called: Neurology.  Admission status: Inpatient. Stepdown. Likely stay 2 days.    Rise Patience MD [facility] Pager [phone].  If 7PM-7AM, please contact night-coverage [email or web address] Password [email or web address]  05/19/2016, 5:29 AM

## 2016-05-19 NOTE — ED Notes (Addendum)
Initial report from EMS was that pt has a hx of seizures however when family arrived they informed RN that pt has never had a seizure.  RN added BMP and CBC per protocol for nen onset seizures.  MD informed

## 2016-05-19 NOTE — Consult Note (Signed)
Neurology Consultation Reason for Consult: Seizures Referring Physician: Palumbo, a  CC: Seizures  History is obtained from: Family  HPI: Kristi Kramer is a 70 y.o. female who presents with seizures. Her boyfriend reports that she had a vocalization then appeared to reach upwards with her right hand, before falling in the bed and shaking all over with arms flexed. Following this, she remained confused and did not recover before having another seizure after EMS arrival. She was therefore given benzodiazepine and transported here.  After arrival here, she remained postictal was given a gram of IV Keppra.  She has a history of headaches and takes Topamax 50 mg daily for headaches.  Currently, she is beginning to arouse but is still very  . Lethargic. ROS:  Unable to obtain due to altered mental status.   Past Medical History:  Diagnosis Date  . Anxiety   . Arthritis   . COPD (chronic obstructive pulmonary disease) (HCC)    sarcoidosis  . Diabetes mellitus   . GERD (gastroesophageal reflux disease)   . High cholesterol   . Hypertension   . Shortness of breath dyspnea   . Sleep apnea    cpap  . Stroke ([place])    x2 12, 16     Family History  Problem Relation Age of Onset  . Liver cancer Father      Social History:  reports that she has been smoking Cigarettes.  She has a 5.50 pack-year smoking history. She has never used smokeless tobacco. She reports that she does not drink alcohol or use drugs.   Exam: Current vital signs: BP 146/78   Pulse 74   Temp 99.4 F (37.4 C) (Rectal)   Resp 26   SpO2 96%  Vital signs in last 24 hours: Temp:  [99.3 F (37.4 C)-99.4 F (37.4 C)] 99.4 F (37.4 C) (10/30 0243) Pulse Rate:  [69-77] 74 (10/30 0130) Resp:  [24-32] 26 (10/30 0130) BP: (132-152)/(74-79) 146/78 (10/30 0130) SpO2:  [93 %-97 %] 96 % (10/30 0130)   Physical Exam  Constitutional: Appears well-developed and well-nourished.  Psych: Affect appropriate to  situation Eyes: No scleral injection HENT: No OP obstrucion Head: Normocephalic.  Cardiovascular: Normal rate and regular rhythm.  Respiratory: Effort normal and breath sounds normal to anterior ascultation GI: Soft.  No distension. There is no tenderness.  Skin: WDI  Neuro: Mental Status: Patient is awake, alert, oriented to person,  Month. She does not answer when I ask her for the year or place. She does follow commands readily Cranial Nerves: II: Visual Fields are full. Pupils are equal, round, and reactive to light.   III,IV, VI: EOMI without ptosis or diploplia.  V: Facial sensation is symmetric to temperature VII: Facial movement is symmetric.  VIII: hearing is intact to voice X: Uvula elevates symmetrically XI: Shoulder shrug is symmetric. XII: tongue is midline without atrophy or fasciculations.  Motor: Tone is normal. Bulk is normal. 5/5 strength was present in all four extremities.  Sensory: Sensation is symmetric to light touch in the arms and legs. Cerebellar: She does not comply with testing       I have reviewed labs in epic and the results pertinent to this consultation are: Mild hypokalemia, elevated glucose  I have reviewed the images obtained:CT head-Remote infarcts  Impression: 70 year old female with a history of remote cortical infarcts who presents with new onset seizures. She clearly has lesions that would be a potential seizure focus, and is also on tramadol which can  lower seizure threshold.  Recommendations: 1) discontinue tramadol 2) increase topiramate to 50 mg twice a day, would plan on increasing to 75 mg twice a day after one week, then 100 mg twice a day 3) MRI brain 4) EEG 5) neurology will follow  Roland Rack, MD [facility] [phone]  If 7pm- 7am, please page neurology on call as listed in [place].

## 2016-05-19 NOTE — Procedures (Signed)
ELECTROENCEPHALOGRAM REPORT  Date of Study: 05/19/2016  Patient's Name: Kristi Kramer MRN: [id number] Date of Birth: March 21, 1946  Referring Provider: Dr. Gean Birchwood  Clinical History: This is a 70 year old woman with seizures.  Medications: topiramate (TOPAMAX) tablet 50 mg  albuterol (PROVENTIL) (2.5 MG/3ML) 0.083% nebulizer solution 2.5 mg  ALPRAZolam (XANAX) tablet 0.5 mg  aspirin EC tablet 81 mg  atorvastatin (LIPITOR) tablet 20 mg  cefTRIAXone (ROCEPHIN) 1 g in dextrose 5 % 50 mL IVPB  citalopram (CELEXA) tablet 20 mg  enoxaparin (LOVENOX) injection 40 mg  fluticasone (FLONASE) 50 MCG/ACT nasal spray 1-2 spray  hydrALAZINE (APRESOLINE) injection 10 mg  insulin aspart (novoLOG) injection 0-9 Units  LORazepam (ATIVAN) injection 1 mg  mometasone-formoterol (DULERA) 200-5 MCG/ACT inhaler 2 puff  pantoprazole (PROTONIX) EC tablet 40 mg  potassium chloride 10 mEq in 100 mL IVPB   Technical Summary: A multichannel digital EEG recording measured by the international 10-20 system with electrodes applied with paste and impedances below 5000 ohms performed as portable with EKG monitoring in an awake and asleep patient.  Hyperventilation and photic stimulation were not performed.  The digital EEG was referentially recorded, reformatted, and digitally filtered in a variety of bipolar and referential montages for optimal display.   Description: The patient is awake and asleep during the recording. There is no clear posterior dominant rhythm. The background is disorganized with a large amount of diffuse 4-7 Hz theta and 2-3 Hz delta slowing, with additional focal 2-3 Hz slowing over the left hemisphere. There are frequent broad sharp waves over the left frontopolar, at times occurring in a pseudo-periodic pattern, without evolution on amplitude or frequency. During drowsiness and sleep, there is an increase in theta and delta slowing of the background with poorly formed sleep spindles  seen.Hyperventilation and photic stimulation were not performed.  There were no electrographic seizures seen.    EKG lead was unremarkable.  Impression: This awake and asleep EEG is abnormal due to the presence of: 1. Moderate diffuse slowing of the waking background 2. Additional focal slowing over the left hemisphere 3. Frequent broad sharp waves over the left frontopolar region, at times occurring in pseudo-periodic pattern.  Clinical Correlation of the above findings indicates bilateral cerebral dysfunction, left greater than right, suggestive of underlying structural or physiologic abnormality. There is possible epileptogenic potential over the left frontopolar region. The pseudo-periodic pattern does not represent ongoing seizure activity, however may be associated with an acute brain lesion and clinical focal seizures, or post-ictal after focal status epilepticus. There were no electrographic seizures in this study.    Ellouise Newer, M.D.

## 2016-05-19 NOTE — ED Notes (Signed)
Admitting called, informed him of Trop. Requested that we insure there is an EKG, he will put in orders for ASA that should be administered when she returns from MRI

## 2016-05-19 NOTE — ED Notes (Signed)
Dr. Leonel Ramsay at bedside with family.

## 2016-05-19 NOTE — ED Provider Notes (Signed)
[facility] DEPT Provider Note   CSN: [id number] Arrival date & time: 05/18/16  2333   By signing my name below, I, Maud Deed. Royston Sinner, attest that this documentation has been prepared under the direction and in the presence of Heela Heishman, MD.  Electronically Signed: Maud Deed. Royston Sinner, ED Scribe. 05/19/16. 12:40 AM.   History   Chief Complaint Chief Complaint  Patient presents with  . Seizures   The history is provided by a relative. No language interpreter was used.  Seizures   This is a new problem. The current episode started 1 to 2 hours ago. The problem has not changed since onset.There were 2 to 3 seizures. The most recent episode lasted 30 to 120 seconds. Pertinent negatives include no speech difficulty and no cough. Characteristics include loss of consciousness and bit tongue. Characteristics do not include bowel incontinence. The episode was witnessed. There was no sensation of an aura present. The seizure(s) had no focality. Possible causes include medication or dosage change. Possible causes do not include sleep deprivation. There has been no fever. Meds prior to arrival: versed.   LEVEL 5 CAVEAT DUE TO ACUITY OF CONDITION   HPI Comments: Kristi Kramer is a 70 y.o. female with a PMHx of COPD, DM, hyperlipidemia, and stroke who presents to the Emergency Department here after an episode of seizure like activity this evening. Per family, pt had a seizure while at her home witnessed by her boyfriend. Daughter states she is unsure how long pt was seizing for. En route to department, pt had a second seizure which lasted 1 minute/ 2 mg of Versed administered prior to arrival to department. No prior history of seizures.   Pt is currently on Keflex, Tessalon, Macrobid, and Lipitor which are all newly prescribed within the last 2.5 months. Pt was diagnosed with a UTI 2 months ago which daughter states has not yet resolved.  PCP: Cyndi Bender, PA-C    Past Medical History:    Diagnosis Date  . Anxiety   . Arthritis   . COPD (chronic obstructive pulmonary disease) (HCC)    sarcoidosis  . Diabetes mellitus   . GERD (gastroesophageal reflux disease)   . High cholesterol   . Hypertension   . Shortness of breath dyspnea   . Sleep apnea    cpap  . Stroke ([place])    x2 12, 16    Patient Active Problem List   Diagnosis Date Noted  . AKI (acute kidney injury) ([place]) 12/26/2015  . Prolonged Q-T interval on ECG 12/26/2015  . 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 Enterococcus UTI 09/28/2015  . Sinus pause 09/27/2015  . DM type 2, goal A1c below 7   . Orthostatic hypotension 09/25/2015  . Hypomagnesemia 09/25/2015  . Vocal cord dysfunction 06/24/2013  . Dyspnea 05/25/2013  . Syncopal episodes 07/23/2011  . Chronic neck pain 07/23/2011  . Fall 07/23/2011  . Epigastric abdominal pain 07/23/2011  . Type 2 diabetes mellitus ([place]) 07/23/2011  . Essential hypertension 07/23/2011  . Hyperlipidemia with target LDL less than 100 07/23/2011  . Chronic headache 07/23/2011  . Hypokalemia 07/23/2011  . OSA (obstructive sleep apnea) 07/23/2011  . Insomnia 07/23/2011  . Cerebrovascular disease 07/23/2011  . Hilar adenopathy R side only, 1.5 cm max 07/23/2011    Past Surgical History:  Procedure Laterality Date  . ABDOMINAL HYSTERECTOMY    . BACK SURGERY    . CHOLECYSTECTOMY    . EAR EXAMINATION UNDER ANESTHESIA Left    plate  .  NECK SURGERY      OB History    No data available       Home Medications    Prior to Admission medications   Medication Sig Start Date End Date Taking? Authorizing Provider  ADVAIR DISKUS 250-50 MCG/DOSE AEPB Inhale 1 puff into the lungs 2 (two) times daily. 11/27/15   Historical Provider, MD  albuterol (PROVENTIL) (2.5 MG/3ML) 0.083% nebulizer solution Take 2.5 mg by nebulization 3 (three) times daily as needed for wheezing or shortness of breath.    Historical Provider, MD  ALPRAZolam Duanne Moron) 0.5 MG tablet Take  1 tablet (0.5 mg total) by mouth daily as needed. For anxiety. 12/28/15   Lavina Hamman, MD  aspirin EC 81 MG tablet Take 81 mg by mouth daily.     Historical Provider, MD  atorvastatin (LIPITOR) 20 MG tablet Take 20 mg by mouth daily.  06/02/15   Historical Provider, MD  azelastine (ASTELIN) 0.1 % nasal spray Place 1 spray into both nostrils 2 (two) times daily. Use in each nostril as directed    Historical Provider, MD  citalopram (CELEXA) 20 MG tablet Take 20 mg by mouth daily.    Historical Provider, MD  esomeprazole (NEXIUM) 20 MG capsule Take 20 mg by mouth daily at 12 noon.    Historical Provider, MD  feeding supplement, ENSURE ENLIVE, (ENSURE ENLIVE) LIQD Take 237 mLs by mouth 2 (two) times daily between meals. 12/28/15   Lavina Hamman, MD  loratadine (CLARITIN) 10 MG tablet Take 10 mg by mouth daily.     Historical Provider, MD  Magnesium Oxide 400 (240 Mg) MG TABS Take 1 tablet by mouth every other day. 12/28/15   Lavina Hamman, MD  metFORMIN (GLUCOPHAGE) 1000 MG tablet Take 1,000 mg by mouth 2 (two) times daily with a meal.  06/02/15   Historical Provider, MD  phenazopyridine (PYRIDIUM) 100 MG tablet Take 1 tablet (100 mg total) by mouth 3 (three) times daily as needed for pain. 12/28/15   Lavina Hamman, MD  potassium chloride SA (K-DUR,KLOR-CON) 20 MEQ tablet Take 1 tablet (20 mEq total) by mouth every other day. 12/28/15   Lavina Hamman, MD  sitaGLIPtin (JANUVIA) 100 MG tablet Take 100 mg by mouth daily.    Historical Provider, MD  topiramate (TOPAMAX) 50 MG tablet Take 1 tablet (50 mg total) by mouth daily. 03/11/16   Pieter Partridge, DO  traMADol (ULTRAM) 50 MG tablet Take 50 mg by mouth every 6 (six) hours as needed for moderate pain.    Historical Provider, MD  VENTOLIN HFA 108 (90 BASE) MCG/ACT inhaler Inhale 2 puffs into the lungs every 6 (six) hours as needed. wheezing 04/10/15   Historical Provider, MD  Vitamin D, Ergocalciferol, (DRISDOL) 50000 units CAPS capsule Take 50,000 Units by  mouth every 7 (seven) days.    Historical Provider, MD    Family History Family History  Problem Relation Age of Onset  . Liver cancer Father     Social History Social History  Substance Use Topics  . Smoking status: Light Tobacco Smoker    Packs/day: 0.25    Years: 22.00    Types: Cigarettes  . Smokeless tobacco: Never Used     Comment: wants patches to quit  . Alcohol use No     Allergies   Mometasone; Codeine; and Sulfa antibiotics   Review of Systems Review of Systems  Unable to perform ROS: Acuity of condition  Constitutional: Negative for fever.  Respiratory:  Negative for cough.   Gastrointestinal: Negative for bowel incontinence.  Neurological: Positive for seizures and loss of consciousness. Negative for speech difficulty.     Physical Exam Updated Vital Signs BP 132/74 (BP Location: Right Arm)   Pulse 69   Temp 99.3 F (37.4 C) (Oral)   Resp (!) 32   SpO2 95%   Physical Exam  Constitutional: She appears well-developed and well-nourished. No distress.  HENT:  Head: Normocephalic and atraumatic. Head is without Battle's sign and without abrasion.  Mouth/Throat: No oropharyngeal exudate.  Bruising to tip of tongue consistent with bite injury from seizure   Eyes: EOM are normal.  Pupils re 2-3 mm and sluggish  Neck: Normal range of motion. No JVD present. No tracheal deviation present.  Cardiovascular: Normal rate, regular rhythm and normal heart sounds.   Pulmonary/Chest: Effort normal and breath sounds normal. No stridor. No respiratory distress. She has no wheezes. She has no rales.  Abdominal: Soft. She exhibits no distension and no mass. There is no tenderness. There is no rebound and no guarding.  Hyperactive bowel sounds  Musculoskeletal: Normal range of motion.  All compartments are soft  Lymphadenopathy:    She has no cervical adenopathy.  Neurological: GCS eye subscore is 3. GCS verbal subscore is 1. GCS motor subscore is 5. She displays no  Babinski's sign on the right side. She displays Babinski's sign on the left side.  Reflex Scores:      Tricep reflexes are 2+ on the right side and 2+ on the left side.      Bicep reflexes are 2+ on the right side and 2+ on the left side.      Brachioradialis reflexes are 2+ on the right side and 2+ on the left side.      Patellar reflexes are 2+ on the right side and 2+ on the left side. Skin: Skin is warm and dry. Capillary refill takes less than 2 seconds.  Sallow colored skin throughout   Nursing note and vitals reviewed.    ED Treatments / Results   Vitals:   05/19/16 0415 05/19/16 0530  BP: 143/84 154/83  Pulse: 69 80  Resp: 19 19  Temp:     Results for orders placed or performed during the hospital encounter of 16/10/96  Basic metabolic panel - if new onset seizures  Result Value Ref Range   Sodium 139 135 - 145 mmol/L   Potassium 3.8 3.5 - 5.1 mmol/L   Chloride 100 (L) 101 - 111 mmol/L   CO2 17 (L) 22 - 32 mmol/L   Glucose, Bld 180 (H) 65 - 99 mg/dL   BUN 23 (H) 6 - 20 mg/dL   Creatinine, Ser 1.10 (H) 0.44 - 1.00 mg/dL   Calcium 9.7 8.9 - 10.3 mg/dL   GFR calc non Af Amer 50 (L) >60 mL/min   GFR calc Af Amer 58 (L) >60 mL/min   Anion gap 22 (H) 5 - 15  CBC - if new onset seizures  Result Value Ref Range   WBC 13.9 (H) 4.0 - 10.5 K/uL   RBC 4.81 3.87 - 5.11 MIL/uL   Hemoglobin 14.4 12.0 - 15.0 g/dL   HCT 42.7 36.0 - 46.0 %   MCV 88.8 78.0 - 100.0 fL   MCH 29.9 26.0 - 34.0 pg   MCHC 33.7 30.0 - 36.0 g/dL   RDW 13.2 11.5 - 15.5 %   Platelets 274 150 - 400 K/uL  Lactic acid, plasma  Result Value  Ref Range   Lactic Acid, Venous 5.4 (HH) 0.5 - 1.9 mmol/L  Lactic acid, plasma  Result Value Ref Range   Lactic Acid, Venous 2.8 (HH) 0.5 - 1.9 mmol/L  Acetaminophen level  Result Value Ref Range   Acetaminophen (Tylenol), Serum <10 (L) 10 - 30 ug/mL  CK total and CKMB (cardiac)not at [facility]  Result Value Ref Range   Total CK 52 38 - 234 U/L   CK, MB 2.1 0.5 - 5.0  ng/mL   Relative Index RELATIVE INDEX IS INVALID 0.0 - 2.5  Salicylate level  Result Value Ref Range   Salicylate Lvl <2.7 2.8 - 30.0 mg/dL  Urinalysis, Routine w reflex microscopic (not at [facility])  Result Value Ref Range   Color, Urine YELLOW YELLOW   APPearance CLOUDY (A) CLEAR   Specific Gravity, Urine 1.019 1.005 - 1.030   pH 6.0 5.0 - 8.0   Glucose, UA NEGATIVE NEGATIVE mg/dL   Hgb urine dipstick SMALL (A) NEGATIVE   Bilirubin Urine NEGATIVE NEGATIVE   Ketones, ur NEGATIVE NEGATIVE mg/dL   Protein, ur NEGATIVE NEGATIVE mg/dL   Nitrite POSITIVE (A) NEGATIVE   Leukocytes, UA NEGATIVE NEGATIVE  Comprehensive metabolic panel  Result Value Ref Range   Sodium 137 135 - 145 mmol/L   Potassium 2.8 (L) 3.5 - 5.1 mmol/L   Chloride 102 101 - 111 mmol/L   CO2 21 (L) 22 - 32 mmol/L   Glucose, Bld 197 (H) 65 - 99 mg/dL   BUN 22 (H) 6 - 20 mg/dL   Creatinine, Ser 1.05 (H) 0.44 - 1.00 mg/dL   Calcium 9.4 8.9 - 10.3 mg/dL   Total Protein 6.8 6.5 - 8.1 g/dL   Albumin 3.6 3.5 - 5.0 g/dL   AST 22 15 - 41 U/L   ALT 13 (L) 14 - 54 U/L   Alkaline Phosphatase 79 38 - 126 U/L   Total Bilirubin 0.4 0.3 - 1.2 mg/dL   GFR calc non Af Amer 53 (L) >60 mL/min   GFR calc Af Amer >60 >60 mL/min   Anion gap 14 5 - 15  Magnesium  Result Value Ref Range   Magnesium 1.9 1.7 - 2.4 mg/dL  Urine microscopic-add on  Result Value Ref Range   Squamous Epithelial / LPF 0-5 (A) NONE SEEN   WBC, UA NONE SEEN 0 - 5 WBC/hpf   RBC / HPF 0-5 0 - 5 RBC/hpf   Bacteria, UA MANY (A) NONE SEEN  Lactic acid, plasma  Result Value Ref Range   Lactic Acid, Venous 1.3 0.5 - 1.9 mmol/L  Hepatic function panel  Result Value Ref Range   Total Protein 6.4 (L) 6.5 - 8.1 g/dL   Albumin 3.5 3.5 - 5.0 g/dL   AST 22 15 - 41 U/L   ALT 13 (L) 14 - 54 U/L   Alkaline Phosphatase 78 38 - 126 U/L   Total Bilirubin 0.6 0.3 - 1.2 mg/dL   Bilirubin, Direct 0.1 0.1 - 0.5 mg/dL   Indirect Bilirubin 0.5 0.3 - 0.9 mg/dL  CBC with  Differential/Platelet  Result Value Ref Range   WBC 13.4 (H) 4.0 - 10.5 K/uL   RBC 4.39 3.87 - 5.11 MIL/uL   Hemoglobin 12.9 12.0 - 15.0 g/dL   HCT 37.0 36.0 - 46.0 %   MCV 84.3 78.0 - 100.0 fL   MCH 29.4 26.0 - 34.0 pg   MCHC 34.9 30.0 - 36.0 g/dL   RDW 12.6 11.5 - 15.5 %   Platelets  237 150 - 400 K/uL   Neutrophils Relative % 83 %   Neutro Abs 11.1 (H) 1.7 - 7.7 K/uL   Lymphocytes Relative 12 %   Lymphs Abs 1.6 0.7 - 4.0 K/uL   Monocytes Relative 5 %   Monocytes Absolute 0.7 0.1 - 1.0 K/uL   Eosinophils Relative 0 %   Eosinophils Absolute 0.0 0.0 - 0.7 K/uL   Basophils Relative 0 %   Basophils Absolute 0.0 0.0 - 0.1 K/uL  Basic metabolic panel  Result Value Ref Range   Sodium 139 135 - 145 mmol/L   Potassium 2.9 (L) 3.5 - 5.1 mmol/L   Chloride 103 101 - 111 mmol/L   CO2 27 22 - 32 mmol/L   Glucose, Bld 134 (H) 65 - 99 mg/dL   BUN 19 6 - 20 mg/dL   Creatinine, Ser 0.91 0.44 - 1.00 mg/dL   Calcium 9.2 8.9 - 10.3 mg/dL   GFR calc non Af Amer >60 >60 mL/min   GFR calc Af Amer >60 >60 mL/min   Anion gap 9 5 - 15  Troponin I  Result Value Ref Range   Troponin I 1.21 (HH) <0.03 ng/mL  Urine rapid drug screen (hosp performed)  Result Value Ref Range   Opiates NONE DETECTED NONE DETECTED   Cocaine NONE DETECTED NONE DETECTED   Benzodiazepines POSITIVE (A) NONE DETECTED   Amphetamines NONE DETECTED NONE DETECTED   Tetrahydrocannabinol NONE DETECTED NONE DETECTED   Barbiturates NONE DETECTED NONE DETECTED  CBG monitoring, ED  Result Value Ref Range   Glucose-Capillary 147 (H) 65 - 99 mg/dL  I-Stat CG4 Lactic Acid, ED  Result Value Ref Range   Lactic Acid, Venous 5.82 (HH) 0.5 - 1.9 mmol/L   Comment NOTIFIED PHYSICIAN    Ct Head Wo Contrast  Result Date: 05/19/2016 CLINICAL DATA:  Multiple seizures, with lethargy, acute onset. Initial encounter. EXAM: CT HEAD WITHOUT CONTRAST TECHNIQUE: Contiguous axial images were obtained from the base of the skull through the vertex  without intravenous contrast. COMPARISON:  CT of the head performed 12/26/2015, and MRI of the brain performed 09/24/2015 FINDINGS: Brain: No evidence of acute infarction, hemorrhage, hydrocephalus, extra-axial collection or mass lesion/mass effect. Small chronic infarcts are noted at the high parietal lobes bilaterally. Mild periventricular white matter change likely reflects small vessel ischemic microangiopathy. Mild chronic ischemic change is noted at the right basal ganglia. The brainstem and fourth ventricle are within normal limits. The cerebral hemispheres demonstrate grossly normal gray-white differentiation. No mass effect or midline shift is seen. Vascular: No hyperdense vessel or unexpected calcification. Skull: There is no evidence of fracture; visualized osseous structures are unremarkable in appearance. Sinuses/Orbits: The orbits are within normal limits. The patient is status post left-sided mastoidectomy. There is partial opacification of the right mastoid air cells. The paranasal sinuses are well-aerated. Other: No significant soft tissue abnormalities are seen. IMPRESSION: 1. No acute intracranial pathology seen on CT. 2. Small chronic infarcts at the high parietal lobes bilaterally. 3. Mild small vessel ischemic microangiopathy. Mild chronic ischemic change at the right basal ganglia. 4. Partial opacification of the right mastoid air cells. Electronically Signed   By: Garald Balding M.D.   On: 05/19/2016 01:38    DIAGNOSTIC STUDIES: Oxygen Saturation is 95% on RA, adequate by my interpretation.    COORDINATION OF CARE: 12:29 AM- Will order blood work. Discussed treatment plan with pt at bedside and pt agreed to plan.     12:56 AM- Spoke with Leonel Ramsay from Neurology.  Will load with Keppra and admit. Leonel Ramsay will come to see the patient.    EKG Interpretation  Date/Time:  Monday May 19 2016 00:42:53 EDT Ventricular Rate:  71 PR Interval:    QRS Duration: 99 QT  Interval:  430 QTC Calculation: 468 R Axis:   86 Text Interpretation:  Sinus rhythm Confirmed by [facility]  MD, Sahian Kerney ([id number]) on 05/19/2016 1:00:13 AM       Procedures Procedures (including critical care time)  Medications Ordered in ED Medications  potassium chloride 10 mEq in 100 mL IVPB (10 mEq Intravenous New Bag/Given 05/19/16 0519)  topiramate (TOPAMAX) tablet 50 mg (0 mg Oral Hold 05/19/16 0647)  fluticasone (FLONASE) 50 MCG/ACT nasal spray 1-2 spray (not administered)  mometasone-formoterol (DULERA) 200-5 MCG/ACT inhaler 2 puff (not administered)  ALPRAZolam (XANAX) tablet 0.5 mg (not administered)  pantoprazole (PROTONIX) EC tablet 40 mg (not administered)  atorvastatin (LIPITOR) tablet 20 mg (not administered)  aspirin EC tablet 81 mg (not administered)  albuterol (PROVENTIL) (2.5 MG/3ML) 0.083% nebulizer solution 2.5 mg (not administered)  citalopram (CELEXA) tablet 20 mg (not administered)  0.9 %  sodium chloride infusion (not administered)  enoxaparin (LOVENOX) injection 40 mg (not administered)  insulin aspart (novoLOG) injection 0-9 Units (not administered)  cefTRIAXone (ROCEPHIN) 1 g in dextrose 5 % 50 mL IVPB (not administered)  potassium chloride 10 mEq in 100 mL IVPB (not administered)  hydrALAZINE (APRESOLINE) injection 10 mg (not administered)  LORazepam (ATIVAN) injection 1 mg (not administered)  levETIRAcetam (KEPPRA) 1,000 mg in sodium chloride 0.9 % 100 mL IVPB (0 mg Intravenous Stopped 05/19/16 0258)  0.9 %  sodium chloride infusion ( Intravenous Stopped 05/19/16 0258)  cefTRIAXone (ROCEPHIN) 1 g in dextrose 5 % 50 mL IVPB (0 g Intravenous Stopped 05/19/16 0612)     Final Clinical Impressions(s) / ED Diagnoses  UTI Seizures  Will admit to triad  I personally performed the services described in this documentation, which was scribed in my presence. The recorded information has been reviewed and is accurate.     Veatrice Kells, MD 05/19/16  [phone]

## 2016-05-19 NOTE — Progress Notes (Signed)
Routine EEG completed, results pending. 

## 2016-05-19 NOTE — ED Notes (Signed)
Per family, pt sleeps w/ a CPAP

## 2016-05-20 ENCOUNTER — Inpatient Hospital Stay (HOSPITAL_COMMUNITY): Payer: Medicare Other

## 2016-05-20 DIAGNOSIS — R9389 Abnormal findings on diagnostic imaging of other specified body structures: Secondary | ICD-10-CM

## 2016-05-20 LAB — GLUCOSE, CAPILLARY
GLUCOSE-CAPILLARY: 113 mg/dL — AB (ref 65–99)
GLUCOSE-CAPILLARY: 116 mg/dL — AB (ref 65–99)
GLUCOSE-CAPILLARY: 114 mg/dL — AB (ref 65–99)
Glucose-Capillary: 114 mg/dL — ABNORMAL HIGH (ref 65–99)

## 2016-05-20 LAB — CBC
HEMATOCRIT: 35.2 % — AB (ref 36.0–46.0)
HEMOGLOBIN: 11.9 g/dL — AB (ref 12.0–15.0)
MCH: 29.2 pg (ref 26.0–34.0)
MCHC: 33.8 g/dL (ref 30.0–36.0)
MCV: 86.3 fL (ref 78.0–100.0)
Platelets: 226 10*3/uL (ref 150–400)
RBC: 4.08 MIL/uL (ref 3.87–5.11)
RDW: 12.9 % (ref 11.5–15.5)
WBC: 7.1 10*3/uL (ref 4.0–10.5)

## 2016-05-20 LAB — COMPREHENSIVE METABOLIC PANEL
ALBUMIN: 3.1 g/dL — AB (ref 3.5–5.0)
ALT: 13 U/L — ABNORMAL LOW (ref 14–54)
ANION GAP: 8 (ref 5–15)
AST: 29 U/L (ref 15–41)
Alkaline Phosphatase: 69 U/L (ref 38–126)
BUN: 16 mg/dL (ref 6–20)
CHLORIDE: 108 mmol/L (ref 101–111)
CO2: 24 mmol/L (ref 22–32)
Calcium: 9.1 mg/dL (ref 8.9–10.3)
Creatinine, Ser: 0.88 mg/dL (ref 0.44–1.00)
GFR calc Af Amer: >60 mL/min (ref >60)
GFR calc non Af Amer: >60 mL/min (ref >60)
GLUCOSE: 122 mg/dL — AB (ref 65–99)
POTASSIUM: 3.3 mmol/L — AB (ref 3.5–5.1)
SODIUM: 140 mmol/L (ref 135–145)
Total Bilirubin: 0.9 mg/dL (ref 0.3–1.2)
Total Protein: 5.9 g/dL — ABNORMAL LOW (ref 6.5–8.1)

## 2016-05-20 LAB — TROPONIN I
Troponin I: 1.25 ng/mL (ref <0.03)
Troponin I: 1.61 ng/mL (ref <0.03)

## 2016-05-20 MED ORDER — LORAZEPAM 2 MG/ML IJ SOLN
2.0000 mg | Freq: Once | INTRAMUSCULAR | Status: AC
  Administered 2016-05-20: 2 mg via INTRAVENOUS
  Filled 2016-05-20: qty 1

## 2016-05-20 MED ORDER — LORAZEPAM 2 MG/ML IJ SOLN
INTRAMUSCULAR | Status: AC
  Administered 2016-05-20: 2 mg
  Filled 2016-05-20: qty 1

## 2016-05-20 NOTE — Progress Notes (Addendum)
MRI with contrast attempted x2 more times this afternoon, pt had '2mg'$  ativan at 1128, pt went to sleep as leaving unit to go to MRI, pt kept waking up and MRI called asking for further intervention to help get MRI done.  Pt orders were for 2-'4mg'$  ativan for MRI, second dose of ativan '2mg'$  given at 1235.   Pt still was not cooperative enough to stay still or stay asleep in order to get test done pt was returned to unit.  Will notify MD --Dr Sherral Hammers of unsuccessful at getting MRI done.

## 2016-05-20 NOTE — Progress Notes (Signed)
PROGRESS NOTE    [name]  GGY:[id number] DOB: [birth year] DOA: 05/18/2016 PCP: Cyndi Bender, PA-C   Brief Narrative:  Kristi Kramer is a 70 y.o.WF PMHx  Anxiety, CVA with left-sided weakness, HTN, DM Type 2 uncontrolled with complications, COPD, OSA,   Brought to the ER after patient had a witnessed seizure. Patient's boyfriend saw the patient had tonic-clonic seizure. EMS was called and after EMS arrival patient had another tonic-clonic seizure and was given Midazolam. Patient was postictal in the ER on arrival. CT head is unremarkable. Neurologist was consulted and patient was placed on loading dose of Keppra. After neurological evaluation patient was placed on increased dose of Topamax which patient usually takes for headaches. On exam patient is still post ictal but follows commands and moves all extremities. Pupils are reacting. No further neurological exam possible since patient is still postictal.    Subjective: 10/31  A/O 3 (does not when), NAD.    Assessment & Plan:   Principal Problem:   Seizures ([place]) Active Problems:   Type 2 diabetes mellitus (HCC)   Essential hypertension   OSA (obstructive sleep apnea)   Seizure (HCC)  Seizures -EEG pending - Brain metastases? -Received call from [facility]: Believe they may see metastases/masses on normal brain MRI. Have ordered contrast MRI. ADDENDUM: Even with Ativan patient unable to cooperate with exam will need General Anesthesia. Have place request  Stroke (history of) -CT scan showed old strokes see results below  OSA -CPAP per respiratory therapy  COPD -Currently asymptomatic monitor closely  HTN -Hydralazine PRN  Possible UTI -UA not convincing for true UTI and is more suggestive of dirty collection - complete 3 days of abx only unless culture more revealing   Diabetes type 2 controlled with, complications -Sensitive SSI     DVT prophylaxis: Lovenox Code Status: Full Family  Communication: None Disposition Plan: Per neurology   Consultants:  Neurology   Procedures/Significant Events:  10/30 CT head WO contrast:-No acute intracranial pathology. - Small chronic infarcts at the high parietal lobes bilaterally. 10/31 brain MRI:-New cerebral signal abnormality and swelling centered on the genu of the corpus callosum. Edematous change also present in the right cerebellum. DDX: Tumefactive demyelination, CNS lymphoma, or atypical infection are primary considerations.   Cultures 10/30 MRSA by PCR negative 10/31 urine  pending  Antimicrobials: Rocephin 10/29 >   Devices    LINES / TUBES:      Continuous Infusions:    Objective: Vitals:   05/20/16 0300 05/20/16 0730 05/20/16 1404 05/20/16 1616  BP: 119/62 118/83 (!) 145/74 128/70  Pulse:  97 (!) 59 (!) 54  Resp:  16 (!) 23 (!) 21  Temp: 97.7 F (36.5 C) 97.8 F (36.6 C) 98.4 F (36.9 C) 99.3 F (37.4 C)  TempSrc: Oral Oral Axillary Axillary  SpO2:  100% 100% 95%  Weight:      Height:        Intake/Output Summary (Last 24 hours) at 05/20/16 1739 Last data filed at 05/20/16 1000  Gross per 24 hour  Intake               50 ml  Output              450 ml  Net             -400 ml   Filed Weights   05/19/16 0958  Weight: 94.3 kg (208 lb)    Examination:  General: A/O 3 (does not when),  No acute respiratory distress Eyes: negative scleral hemorrhage, negative anisocoria, negative icterus ENT: Negative Runny nose, negative gingival bleeding, Neck:  Negative scars, masses, torticollis, lymphadenopathy, JVD Lungs: Clear to auscultation bilaterally without wheezes or crackles Cardiovascular: Regular rate and rhythm without murmur gallop or rub normal S1 and S2 Abdomen: negative abdominal pain, nondistended, positive soft, bowel sounds, no rebound, no ascites, no appreciable mass Extremities: No significant cyanosis, clubbing, or edema bilateral lower extremities Skin: Negative  rashes, lesions, ulcers Psychiatric:  Negative depression, negative anxiety, negative fatigue, negative mania  Central nervous system:  Cranial nerves II through XII intact, tongue/uvula midline, all extremities muscle strength 5/5, sensation intact throughout,negative dysarthria, negative expressive aphasia, negative receptive aphasia.  .     Data Reviewed: Care during the described time interval was provided by me .  I have reviewed this patient's available data, including medical history, events of note, physical examination, and all test results as part of my evaluation. I have personally reviewed and interpreted all radiology studies.  CBC:  Recent Labs Lab 05/19/16 0014 05/19/16 0538 05/20/16 0650  WBC 13.9* 13.4* 7.1  NEUTROABS  --  11.1*  --   HGB 14.4 12.9 11.9*  HCT 42.7 37.0 35.2*  MCV 88.8 84.3 86.3  PLT 274 237 427   Basic Metabolic Panel:  Recent Labs Lab 05/19/16 0014 05/19/16 0051 05/19/16 0310 05/19/16 0538 05/20/16 0650  NA 139 137  --  139 140  K 3.8 2.8*  --  2.9* 3.3*  CL 100* 102  --  103 108  CO2 17* 21*  --  27 24  GLUCOSE 180* 197*  --  134* 122*  BUN 23* 22*  --  19 16  CREATININE 1.10* 1.05*  --  0.91 0.88  CALCIUM 9.7 9.4  --  9.2 9.1  MG  --   --  1.9  --   --    GFR: Estimated Creatinine Clearance: 69.4 mL/min (by C-G formula based on SCr of 0.88 mg/dL). Liver Function Tests:  Recent Labs Lab 05/19/16 0051 05/19/16 0538 05/20/16 0650  AST '22 22 29  '$ ALT 13* 13* 13*  ALKPHOS 79 78 69  BILITOT 0.4 0.6 0.9  PROT 6.8 6.4* 5.9*  ALBUMIN 3.6 3.5 3.1*   No results for input(s): LIPASE, AMYLASE in the last 168 hours. No results for input(s): AMMONIA in the last 168 hours. Coagulation Profile: No results for input(s): INR, PROTIME in the last 168 hours. Cardiac Enzymes:  Recent Labs Lab 05/19/16 0031 05/19/16 0538 05/19/16 1048 05/19/16 1812 05/19/16 2350 05/20/16 0650  CKTOTAL 52  --   --   --   --   --   CKMB 2.1  --   --    --   --   --   TROPONINI  --  1.21* 0.76* 1.59* 1.61* 1.25*   BNP (last 3 results) No results for input(s): PROBNP in the last 8760 hours. HbA1C: No results for input(s): HGBA1C in the last 72 hours. CBG:  Recent Labs Lab 05/19/16 1926 05/19/16 2359 05/20/16 0726 05/20/16 1409 05/20/16 1615  GLUCAP 106* 111* 113* 114* 116*   Lipid Profile: No results for input(s): CHOL, HDL, LDLCALC, TRIG, CHOLHDL, LDLDIRECT in the last 72 hours. Thyroid Function Tests: No results for input(s): TSH, T4TOTAL, FREET4, T3FREE, THYROIDAB in the last 72 hours. Anemia Panel: No results for input(s): VITAMINB12, FOLATE, FERRITIN, TIBC, IRON, RETICCTPCT in the last 72 hours. Urine analysis:    Component Value Date/Time   COLORURINE YELLOW  05/19/2016 0245   APPEARANCEUR CLOUDY (A) 05/19/2016 0245   LABSPEC 1.019 05/19/2016 0245   PHURINE 6.0 05/19/2016 0245   GLUCOSEU NEGATIVE 05/19/2016 0245   HGBUR SMALL (A) 05/19/2016 0245   BILIRUBINUR NEGATIVE 05/19/2016 0245   KETONESUR NEGATIVE 05/19/2016 0245   PROTEINUR NEGATIVE 05/19/2016 0245   UROBILINOGEN 0.2 05/31/2015 1420   NITRITE POSITIVE (A) 05/19/2016 0245   LEUKOCYTESUR NEGATIVE 05/19/2016 0245   Sepsis Labs: '@LABRCNTIP'$ (procalcitonin:4,lacticidven:4)  ) Recent Results (from the past 240 hour(s))  MRSA PCR Screening     Status: None   Collection Time: 05/19/16  9:52 AM  Result Value Ref Range Status   MRSA by PCR NEGATIVE NEGATIVE Final    Comment:        The GeneXpert MRSA Assay (FDA approved for NASAL specimens only), is one component of a comprehensive MRSA colonization surveillance program. It is not intended to diagnose MRSA infection nor to guide or monitor treatment for MRSA infections.          Radiology Studies: Ct Head Wo Contrast  Result Date: 05/19/2016 CLINICAL DATA:  Multiple seizures, with lethargy, acute onset. Initial encounter. EXAM: CT HEAD WITHOUT CONTRAST TECHNIQUE: Contiguous axial images were  obtained from the base of the skull through the vertex without intravenous contrast. COMPARISON:  CT of the head performed 12/26/2015, and MRI of the brain performed 09/24/2015 FINDINGS: Brain: No evidence of acute infarction, hemorrhage, hydrocephalus, extra-axial collection or mass lesion/mass effect. Small chronic infarcts are noted at the high parietal lobes bilaterally. Mild periventricular white matter change likely reflects small vessel ischemic microangiopathy. Mild chronic ischemic change is noted at the right basal ganglia. The brainstem and fourth ventricle are within normal limits. The cerebral hemispheres demonstrate grossly normal gray-white differentiation. No mass effect or midline shift is seen. Vascular: No hyperdense vessel or unexpected calcification. Skull: There is no evidence of fracture; visualized osseous structures are unremarkable in appearance. Sinuses/Orbits: The orbits are within normal limits. The patient is status post left-sided mastoidectomy. There is partial opacification of the right mastoid air cells. The paranasal sinuses are well-aerated. Other: No significant soft tissue abnormalities are seen. IMPRESSION: 1. No acute intracranial pathology seen on CT. 2. Small chronic infarcts at the high parietal lobes bilaterally. 3. Mild small vessel ischemic microangiopathy. Mild chronic ischemic change at the right basal ganglia. 4. Partial opacification of the right mastoid air cells. Electronically Signed   By: Garald Balding M.D.   On: 05/19/2016 01:38   Mr Brain Wo Contrast  Result Date: 05/20/2016 CLINICAL DATA:  Seizure.  History of CVA with left-sided weakness. EXAM: MRI HEAD WITHOUT CONTRAST TECHNIQUE: Multiplanar, multiecho pulse sequences of the brain and surrounding structures were obtained without intravenous contrast. COMPARISON:  09/24/2015 and earlier FINDINGS: Brain: There is new T2 and FLAIR hyperintensity centered on the genu of the corpus callosum and extending  into the inferior frontal white matter/cortex, with swelling. These changes also continue along the hypothalamus and in the caudate heads. Minimal if any diffusion restriction. No evidence of blood products. There is edematous change also seen in the peripheral right cerebellum. Stable gliosis in the left parietal and parasagittal right frontal lobes. In the left parietal region 08/10/2015 there was edema and enhancement which subsided. Inflammation or venous infarct were the primary considerations at that time. Current findings are presumably related to the same process. The dural venous sinuses have normal flow voids and the current cerebral edematous changes are not typical for veno-occlusive disease. A inflammatory/ demyelinating process is favored.  CNS lymphoma is considered given the periventricular predominant findings in the cerebral hemispheres, but the cerebellar finding would not be typical. Cerebritis is also considered. Vascular: Normal flow voids Skull and upper cervical spine:  Negative. Sinuses/Orbits:Left mastoidectomy.  Chronic right mastoiditis. Other: These results were called by telephone at the time of interpretation on 05/20/2016 at 10:12 am to Dr. Gari Crown , who verbally acknowledged these results. IMPRESSION: New cerebral signal abnormality and swelling centered on the genu of the corpus callosum. Edematous change also present in the right cerebellum. Underlying pathology is presumably the same process as seen in the left parietal lobe 08/10/2015. Tumefactive demyelination, CNS lymphoma, or atypical infection are primary considerations. Veno-occlusive disease was implicated previously; dural venous sinuses have an unremarkable noncontrast appearance. Enhanced MRI is planned. Electronically Signed   By: Monte Fantasia M.D.   On: 05/20/2016 10:32        Scheduled Meds: . aspirin EC  81 mg Oral Daily  . atorvastatin  20 mg Oral q1800  . cefTRIAXone (ROCEPHIN) IVPB 1 gram/50 mL D5W  1  g Intravenous Q24H  . citalopram  20 mg Oral Daily  . enoxaparin (LOVENOX) injection  40 mg Subcutaneous Q24H  . fluticasone  1-2 spray Each Nare BID  . insulin aspart  0-9 Units Subcutaneous TID WC  . pantoprazole  40 mg Oral Daily  . topiramate  50 mg Oral BID   Continuous Infusions:    LOS: 1 day    Time spent: 40 minutes    WOODS, Geraldo Docker, MD [facility] Pager [phone]   If 7PM-7AM, please contact night-coverage [email or web address] Password [email or web address] 05/20/2016, 5:39 PM

## 2016-05-21 ENCOUNTER — Encounter (HOSPITAL_COMMUNITY)
Admission: EM | Disposition: A | Discharge status: Home / Self Care | Payer: Self-pay | Source: Home / Self Care | Attending: Internal Medicine

## 2016-05-21 ENCOUNTER — Inpatient Hospital Stay (HOSPITAL_COMMUNITY): Payer: Medicare Other

## 2016-05-21 ENCOUNTER — Inpatient Hospital Stay (HOSPITAL_COMMUNITY): Payer: Medicare Other | Admitting: Anesthesiology

## 2016-05-21 HISTORY — PX: RADIOLOGY WITH ANESTHESIA: SHX6223

## 2016-05-21 LAB — GLUCOSE, CAPILLARY
GLUCOSE-CAPILLARY: 117 mg/dL — AB (ref 65–99)
GLUCOSE-CAPILLARY: 115 mg/dL — AB (ref 65–99)
GLUCOSE-CAPILLARY: 125 mg/dL — AB (ref 65–99)
Glucose-Capillary: 127 mg/dL — ABNORMAL HIGH (ref 65–99)
Glucose-Capillary: 132 mg/dL — ABNORMAL HIGH (ref 65–99)

## 2016-05-21 SURGERY — RADIOLOGY WITH ANESTHESIA
Anesthesia: General

## 2016-05-21 MED ORDER — ALPRAZOLAM 0.5 MG PO TABS
0.5000 mg | ORAL_TABLET | Freq: Two times a day (BID) | ORAL | Status: DC | PRN
  Administered 2016-05-24: 0.5 mg via ORAL
  Filled 2016-05-21: qty 1

## 2016-05-21 MED ORDER — GADOBENATE DIMEGLUMINE 529 MG/ML IV SOLN
20.0000 mL | Freq: Once | INTRAVENOUS | Status: AC
  Administered 2016-05-21: 20 mL via INTRAVENOUS

## 2016-05-21 MED ORDER — CARVEDILOL 3.125 MG PO TABS
3.1250 mg | ORAL_TABLET | Freq: Two times a day (BID) | ORAL | Status: DC
  Administered 2016-05-21 – 2016-05-28 (×12): 3.125 mg via ORAL
  Filled 2016-05-21 (×13): qty 1

## 2016-05-21 NOTE — Progress Notes (Addendum)
[name] TEAM 1 - Stepdown/ICU TEAM  Kristi Kramer  OJJ:[id number] DOB: Jul 07, 1946 DOA: 05/18/2016 PCP: Cyndi Bender, PA-C    Brief Narrative:  70 y.o. female with history of CVA with left-sided weakness, HTN, DM2, COPD, and sleep apnea who was brought to the ER after her boyfriend saw the patient have a tonic-clonic seizure. EMS was called and after EMS arrival the patient had another tonic-clonic seizure and was given Midazolam. Patient was postictal in the ER on arrival. CT head was unremarkable. Neurologist was consulted and patient was placed on loading dose of Keppra.   Subjective: The patient is resting comfortably in bed.  She is hungry.  She is anxious to be discharged home.  She denies headache fevers chills nausea vomiting or abdominal pain.  Assessment & Plan:    Abnormal MRI noted on MRI brain w/o contrast 10/31 - MRI brain w/ contrast under anesthesia today noted abnormal enhancement in both inferior frontal gyri, the genu of the corpus callosum, and the R cerebullum - some different areas of abnormal MRI appearance were first noted in January 2017 - pt has undergone evaluation by Dr. 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 - will consult neurosurgery in the morning for a repeat evaluation - I have discussed the findings with the patient and her family at bedside - will go ahead and stop her prophylactic ASA in anticipation of possible bx   Seizures Likely related to above - appear well controlled at present - Neurology following  Elevated lactate Due to seizure activity   Obstructive sleep apnea on CPAP  Possible UTI UA not convincing for true UTI and is more suggestive of dirty collection - complete 3 days of abx with final dose today  Hypertension Blood pressure trending upward - adjust treatment and follow  Hypokalemia Was trending up yesterday -  follow-up in a.m.  DM 2  Currently well-controlled  COPD Quiescent  DVT prophylaxis: lovenox Code Status: FULL CODE Family Communication: spoke to son, daughter, and significant other at bedside Disposition Plan: SDU  Consultants:  Neurology   Procedures: EEG - 10/30 - focal slowing over L hemisphere - frequent broad sharp waves over L frontopolar region   Antimicrobials:  Rocephin 10/30 > 11/1  Objective: Blood pressure (!) 140/99, pulse 76, temperature 98.4 F (36.9 C), temperature source Oral, resp. rate (!) 23, height '5\' 5"'$  (1.651 m), weight 94.3 kg (208 lb), SpO2 98 %.  Intake/Output Summary (Last 24 hours) at 05/21/16 1521 Last data filed at 05/21/16 1410  Gross per 24 hour  Intake              540 ml  Output              475 ml  Net               65 ml   Filed Weights   05/19/16 0958  Weight: 94.3 kg (208 lb)    Examination: General: No acute respiratory distress Lungs: Clear to auscultation bilaterally without wheezes or crackles Cardiovascular: Regular rate and rhythm without murmur gallop or rub normal S1 and S2 Abdomen: Nontender, nondistended, soft, bowel sounds positive, no rebound, no ascites, no appreciable mass Extremities: No significant cyanosis, clubbing, or edema bilateral lower extremities  CBC:  Recent Labs Lab 05/19/16 0014 05/19/16 0538 05/20/16 0650  WBC 13.9* 13.4* 7.1  NEUTROABS  --  11.1*  --  HGB 14.4 12.9 11.9*  HCT 42.7 37.0 35.2*  MCV 88.8 84.3 86.3  PLT 274 237 094   Basic Metabolic Panel:  Recent Labs Lab 05/19/16 0014 05/19/16 0051 05/19/16 0310 05/19/16 0538 05/20/16 0650  NA 139 137  --  139 140  K 3.8 2.8*  --  2.9* 3.3*  CL 100* 102  --  103 108  CO2 17* 21*  --  27 24  GLUCOSE 180* 197*  --  134* 122*  BUN 23* 22*  --  19 16  CREATININE 1.10* 1.05*  --  0.91 0.88  CALCIUM 9.7 9.4  --  9.2 9.1  MG  --   --  1.9  --   --    GFR: Estimated Creatinine Clearance: 69.4 mL/min (by C-G formula based on SCr  of 0.88 mg/dL).  Liver Function Tests:  Recent Labs Lab 05/19/16 0051 05/19/16 0538 05/20/16 0650  AST '22 22 29  '$ ALT 13* 13* 13*  ALKPHOS 79 78 69  BILITOT 0.4 0.6 0.9  PROT 6.8 6.4* 5.9*  ALBUMIN 3.6 3.5 3.1*    Cardiac Enzymes:  Recent Labs Lab 05/19/16 0031 05/19/16 0538 05/19/16 1048 05/19/16 1812 05/19/16 2350 05/20/16 0650  CKTOTAL 52  --   --   --   --   --   CKMB 2.1  --   --   --   --   --   TROPONINI  --  1.21* 0.76* 1.59* 1.61* 1.25*    HbA1C: Hgb A1c MFr Bld  Date/Time Value Ref Range Status  12/27/2015 04:13 AM 6.0 (H) 4.8 - 5.6 % Final    Comment:    (NOTE)         Pre-diabetes: 5.7 - 6.4         Diabetes: >6.4         Glycemic control for adults with diabetes: <7.0   08/14/2015 03:20 PM 8.1 (H) 4.8 - 5.6 % Final    Comment:    (NOTE)         Pre-diabetes: 5.7 - 6.4         Diabetes: >6.4         Glycemic control for adults with diabetes: <7.0     CBG:  Recent Labs Lab 05/20/16 1615 05/20/16 2239 05/21/16 0728 05/21/16 1033 05/21/16 1155  GLUCAP 116* 114* 117* 115* 127*    Recent Results (from the past 240 hour(s))  MRSA PCR Screening     Status: None   Collection Time: 05/19/16  9:52 AM  Result Value Ref Range Status   MRSA by PCR NEGATIVE NEGATIVE Final    Comment:        The GeneXpert MRSA Assay (FDA approved for NASAL specimens only), is one component of a comprehensive MRSA colonization surveillance program. It is not intended to diagnose MRSA infection nor to guide or monitor treatment for MRSA infections.      Scheduled Meds: . aspirin EC  81 mg Oral Daily  . atorvastatin  20 mg Oral q1800  . cefTRIAXone (ROCEPHIN) IVPB 1 gram/50 mL D5W  1 g Intravenous Q24H  . citalopram  20 mg Oral Daily  . enoxaparin (LOVENOX) injection  40 mg Subcutaneous Q24H  . fluticasone  1-2 spray Each Nare BID  . insulin aspart  0-9 Units Subcutaneous TID WC  . pantoprazole  40 mg Oral Daily  . topiramate  50 mg Oral BID      LOS: 2 days    Dellis Filbert T.  Thereasa Solo, MD [facility] Office  [phone] Pager - Text Page per Amion as per below:  On-Call/Text Page:      [email or web address]      password [email or web address]  If 7PM-7AM, please contact night-coverage [email or web address] Password [email or web address] 05/21/2016, 3:21 PM

## 2016-05-21 NOTE — Anesthesia Preprocedure Evaluation (Signed)
Anesthesia Evaluation  Patient identified by MRN, date of birth, ID band Patient awake    Reviewed: Allergy & Precautions, NPO status , Patient's Chart, lab work & pertinent test results  Airway Mallampati: I       Dental   Pulmonary Current Smoker,    Pulmonary exam normal        Cardiovascular hypertension, Pt. on medications Normal cardiovascular exam     Neuro/Psych    GI/Hepatic Neg liver ROS,   Endo/Other  negative endocrine ROSdiabetes, Type 2  Renal/GU   negative genitourinary   Musculoskeletal   Abdominal Normal abdominal exam  (+)   Peds negative pediatric ROS (+)  Hematology negative hematology ROS (+)   Anesthesia Other Findings   Reproductive/Obstetrics negative OB ROS                             Anesthesia Physical Anesthesia Plan  ASA: III  Anesthesia Plan: General   Post-op Pain Management:    Induction: Intravenous  Airway Management Planned: Oral ETT  Additional Equipment:   Intra-op Plan:   Post-operative Plan: Extubation in OR  Informed Consent: I have reviewed the patients History and Physical, chart, labs and discussed the procedure including the risks, benefits and alternatives for the proposed anesthesia with the patient or authorized representative who has indicated his/her understanding and acceptance.   Dental advisory given  Plan Discussed with: CRNA and Surgeon  Anesthesia Plan Comments:         Anesthesia Quick Evaluation

## 2016-05-21 NOTE — Anesthesia Postprocedure Evaluation (Signed)
Anesthesia Post Note  Patient: Kristi Kramer  Procedure(s) Performed: Procedure(s) (LRB): RADIOLOGY WITH ANESTHESIA - MRI (N/A)  Patient location during evaluation: PACU Anesthesia Type: General Level of consciousness: awake and sedated Pain management: pain level controlled Vital Signs Assessment: post-procedure vital signs reviewed and stable Respiratory status: respiratory function stable Cardiovascular status: stable Postop Assessment: no signs of nausea or vomiting Anesthetic complications: no     Last Vitals:  Vitals:   05/21/16 1030 05/21/16 1042  BP: (!) 140/57   Pulse: 65 67  Resp: (!) 23 (!) 21  Temp:  36.1 C    Last Pain:  Vitals:   05/21/16 0713  TempSrc: Oral  PainSc:    Pain Goal: Patients Stated Pain Goal: 0 (05/20/16 0953)               Densel Kronick JR,JOHN [facility]

## 2016-05-21 NOTE — Transfer of Care (Signed)
Immediate Anesthesia Transfer of Care Note  Patient: Kristi Kramer  Procedure(s) Performed: Procedure(s): RADIOLOGY WITH ANESTHESIA - MRI (N/A)  Patient Location: PACU  Anesthesia Type:General  Level of Consciousness: awake, alert  and oriented  Airway & Oxygen Therapy: Patient Spontanous Breathing and Patient connected to nasal cannula oxygen  Post-op Assessment: Report given to RN, Post -op Vital signs reviewed and stable and Patient moving all extremities X 4  Post vital signs: Reviewed and stable  Last Vitals:  Vitals:   05/21/16 1030 05/21/16 1042  BP: (!) 140/57 (!) 111/92  Pulse: 65 67  Resp: (!) 23 (!) 21  Temp:  36.1 C    Last Pain:  Vitals:   05/21/16 0713  TempSrc: Oral  PainSc:       Patients Stated Pain Goal: 0 (38/17/71 1657)  Complications: No apparent anesthesia complications

## 2016-05-22 ENCOUNTER — Inpatient Hospital Stay (HOSPITAL_COMMUNITY): Payer: Medicare Other

## 2016-05-22 ENCOUNTER — Encounter (HOSPITAL_COMMUNITY): Payer: Self-pay | Admitting: Radiology

## 2016-05-22 DIAGNOSIS — E118 Type 2 diabetes mellitus with unspecified complications: Secondary | ICD-10-CM

## 2016-05-22 DIAGNOSIS — Z79899 Other long term (current) drug therapy: Secondary | ICD-10-CM

## 2016-05-22 LAB — CBC
HCT: 36.2 % (ref 36.0–46.0)
HEMOGLOBIN: 12.5 g/dL (ref 12.0–15.0)
MCH: 29 pg (ref 26.0–34.0)
MCHC: 34.5 g/dL (ref 30.0–36.0)
MCV: 84 fL (ref 78.0–100.0)
Platelets: 240 10*3/uL (ref 150–400)
RBC: 4.31 MIL/uL (ref 3.87–5.11)
RDW: 12.6 % (ref 11.5–15.5)
WBC: 7.8 10*3/uL (ref 4.0–10.5)

## 2016-05-22 LAB — BASIC METABOLIC PANEL
ANION GAP: 9 (ref 5–15)
BUN: 8 mg/dL (ref 6–20)
CALCIUM: 9.3 mg/dL (ref 8.9–10.3)
CO2: 25 mmol/L (ref 22–32)
Chloride: 102 mmol/L (ref 101–111)
Creatinine, Ser: 0.86 mg/dL (ref 0.44–1.00)
Glucose, Bld: 132 mg/dL — ABNORMAL HIGH (ref 65–99)
Potassium: 2.8 mmol/L — ABNORMAL LOW (ref 3.5–5.1)
SODIUM: 136 mmol/L (ref 135–145)

## 2016-05-22 LAB — GLUCOSE, CAPILLARY
GLUCOSE-CAPILLARY: 123 mg/dL — AB (ref 65–99)
GLUCOSE-CAPILLARY: 153 mg/dL — AB (ref 65–99)
GLUCOSE-CAPILLARY: 157 mg/dL — AB (ref 65–99)
GLUCOSE-CAPILLARY: 112 mg/dL — AB (ref 65–99)

## 2016-05-22 LAB — PROTIME-INR
INR: 1.08
PROTHROMBIN TIME: 14.1 s (ref 11.4–15.2)

## 2016-05-22 MED ORDER — IOPAMIDOL (ISOVUE-300) INJECTION 61%
INTRAVENOUS | Status: AC
  Administered 2016-05-22: 100 mL via INTRAVENOUS
  Filled 2016-05-22: qty 100

## 2016-05-22 MED ORDER — POTASSIUM CHLORIDE CRYS ER 10 MEQ PO TBCR
50.0000 meq | EXTENDED_RELEASE_TABLET | Freq: Once | ORAL | Status: AC
  Administered 2016-05-22: 50 meq via ORAL
  Filled 2016-05-22: qty 2

## 2016-05-22 MED ORDER — IOPAMIDOL (ISOVUE-300) INJECTION 61%
15.0000 mL | INTRAVENOUS | Status: AC
  Administered 2016-05-22 (×2): 15 mL via ORAL

## 2016-05-22 NOTE — Progress Notes (Signed)
PROGRESS NOTE    IRIDIAN READER  BHA:[id number] DOB: [birth year] DOA: 05/18/2016 PCP: Cyndi Bender, PA-C   Brief Narrative:  Kristi Kramer is a 70 y.o.WF PMHx  Anxiety, CVA with left-sided weakness, HTN, DM Type 2 uncontrolled with complications, COPD, OSA,   Brought to the ER after patient had a witnessed seizure. Patient's boyfriend saw the patient had tonic-clonic seizure. EMS was called and after EMS arrival patient had another tonic-clonic seizure and was given Midazolam. Patient was postictal in the ER on arrival. CT head is unremarkable. Neurologist was consulted and patient was placed on loading dose of Keppra. After neurological evaluation patient was placed on increased dose of Topamax which patient usually takes for headaches. On exam patient is still post ictal but follows commands and moves all extremities. Pupils are reacting. No further neurological exam possible since patient is still postictal.    Subjective: 11/2  A/O 4, positive headache, some difficulty understanding complex ideas. NAD. Kristi Kramer [phone], [phone] cell present. Currently acting as patient's friend/medical Optometrist. Patient states she would like Kristi Kramer her significant other to be her Primary HCPOA Per patient and LCSW there is some infighting amongst the children on what's best for the patient.   Assessment & Plan:   Principal Problem:   Seizures ([place]) Active Problems:   Type 2 diabetes mellitus (HCC)   Essential hypertension   OSA (obstructive sleep apnea)   Seizure (HCC)   Abnormal MRI  Seizures -EEG: Abnormal could be consistent with structural abnormality see results below  Brain metastases/Primary Brain Lymphoma -Received call from [facility]: Believe they may see metastases/masses on normal brain MRI. Have ordered contrast MRI. 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  Newman Pies Neurosurgery has seen previously March 2017 -Obtain CT chest, abdomen, pelvis to complete neoplasm workup. Will await findings to determine best site for biopsy  Stroke (history of) -CT scan showed old strokes see results below  OSA -CPAP per respiratory therapy  COPD -Currently asymptomatic monitor closely  HTN -Hydralazine PRN  Possible UTI -UA not convincing for true UTI and is more suggestive of dirty collection - complete 3 days of abx only unless culture more revealing   Diabetes type 2 controlled with, complications -Sensitive SSI  Hypokalemia -K-Dur 50 mEq   Goals of care  -Obtain Palliative Care Consult: Patient and significant other (Kristi Kramer) would like to speak with member of palliative care team to discuss appointing primary and secondary HCPOA, CODE STATUS, short-term and long-term goals of care.    DVT prophylaxis: Lovenox Code Status: Full Family Communication: Kristi [phone] Kramer, [phone] cell present and Kristi Kramer significant other present Disposition Plan: Per neurology   Consultants:  Dr.Karen Jacquenette [facility] Dr. Newman Pies Neurosurgery pending    Procedures/Significant Events:  10/30 CT head WO contrast:-No acute intracranial pathology. - Small chronic infarcts at the high parietal lobes bilaterally. EEG - 10/30 - focal slowing over L hemisphere - frequent broad sharp waves over L frontopolar region  10/31 brain MRI:-New cerebral signal abnormality and swelling centered on the genu of the corpus callosum. Edematous change also present in the right cerebellum. DDX: Tumefactive demyelination, CNS lymphoma, or atypical infection are primary considerations.    Cultures 10/30 MRSA by PCR negative 10/31 urine  pending  Antimicrobials: Rocephin 10/29 > 11/1   Devices    LINES / TUBES:      Continuous Infusions:  Objective: Vitals:   05/22/16 0253 05/22/16 0505 05/22/16 0746 05/22/16  0807  BP: (!) 146/84 (!) 147/78  (!) 157/82  Pulse: 74 71  69  Resp: (!) 23 20    Temp:  98 F (36.7 C) 98.4 F (36.9 C)   TempSrc:  Oral Oral   SpO2: 100% 99%    Weight:      Height:        Intake/Output Summary (Last 24 hours) at 05/22/16 0865 Last data filed at 05/21/16 2218  Gross per 24 hour  Intake                0 ml  Output              875 ml  Net             -875 ml   Filed Weights   05/19/16 0958  Weight: 94.3 kg (208 lb)    Examination:  General: A/O 4, No acute respiratory distress Eyes: negative scleral hemorrhage, negative anisocoria, negative icterus ENT: Negative Runny nose, negative gingival bleeding, Neck:  Negative scars, masses, torticollis, lymphadenopathy, JVD Lungs: Clear to auscultation bilaterally without wheezes or crackles Cardiovascular: Regular rate and rhythm without murmur gallop or rub normal S1 and S2 Abdomen: negative abdominal pain, nondistended, positive soft, bowel sounds, no rebound, no ascites, no appreciable mass Extremities: No significant cyanosis, clubbing, or edema bilateral lower extremities Skin: Negative rashes, lesions, ulcers Psychiatric:  Negative depression, negative anxiety, negative fatigue, negative mania  Central nervous system:  Cranial nerves II through XII intact, tongue/uvula midline, all extremities muscle strength 5/5, sensation intact throughout,negative dysarthria, negative expressive aphasia, negative receptive aphasia.  .     Data Reviewed: Care during the described time interval was provided by me .  I have reviewed this patient's available data, including medical history, events of note, physical examination, and all test results as part of my evaluation. I have personally reviewed and interpreted all radiology studies.  CBC:  Recent Labs Lab 05/19/16 0014 05/19/16 0538 05/20/16 0650 05/22/16 0255  WBC 13.9* 13.4* 7.1 7.8  NEUTROABS  --  11.1*  --   --   HGB 14.4 12.9 11.9* 12.5  HCT 42.7 37.0  35.2* 36.2  MCV 88.8 84.3 86.3 84.0  PLT 274 237 226 784   Basic Metabolic Panel:  Recent Labs Lab 05/19/16 0014 05/19/16 0051 05/19/16 0310 05/19/16 0538 05/20/16 0650 05/22/16 0255  NA 139 137  --  139 140 136  K 3.8 2.8*  --  2.9* 3.3* 2.8*  CL 100* 102  --  103 108 102  CO2 17* 21*  --  '27 24 25  '$ GLUCOSE 180* 197*  --  134* 122* 132*  BUN 23* 22*  --  '19 16 8  '$ CREATININE 1.10* 1.05*  --  0.91 0.88 0.86  CALCIUM 9.7 9.4  --  9.2 9.1 9.3  MG  --   --  1.9  --   --   --    GFR: Estimated Creatinine Clearance: 71.1 mL/min (by C-G formula based on SCr of 0.86 mg/dL). Liver Function Tests:  Recent Labs Lab 05/19/16 0051 05/19/16 0538 05/20/16 0650  AST '22 22 29  '$ ALT 13* 13* 13*  ALKPHOS 79 78 69  BILITOT 0.4 0.6 0.9  PROT 6.8 6.4* 5.9*  ALBUMIN 3.6 3.5 3.1*   No results for input(s): LIPASE, AMYLASE in the last 168 hours. No results for input(s): AMMONIA in the last 168 hours. Coagulation Profile:  Recent Labs Lab 05/22/16 0255  INR 1.08   Cardiac Enzymes:  Recent Labs Lab 05/19/16 0031 05/19/16 0538 05/19/16 1048 05/19/16 1812 05/19/16 2350 05/20/16 0650  CKTOTAL 52  --   --   --   --   --   CKMB 2.1  --   --   --   --   --   TROPONINI  --  1.21* 0.76* 1.59* 1.61* 1.25*   BNP (last 3 results) No results for input(s): PROBNP in the last 8760 hours. HbA1C: No results for input(s): HGBA1C in the last 72 hours. CBG:  Recent Labs Lab 05/21/16 1033 05/21/16 1155 05/21/16 1728 05/21/16 2116 05/22/16 0809  GLUCAP 115* 127* 132* 125* 123*   Lipid Profile: No results for input(s): CHOL, HDL, LDLCALC, TRIG, CHOLHDL, LDLDIRECT in the last 72 hours. Thyroid Function Tests: No results for input(s): TSH, T4TOTAL, FREET4, T3FREE, THYROIDAB in the last 72 hours. Anemia Panel: No results for input(s): VITAMINB12, FOLATE, FERRITIN, TIBC, IRON, RETICCTPCT in the last 72 hours. Urine analysis:    Component Value Date/Time   COLORURINE YELLOW 05/19/2016  0245   APPEARANCEUR CLOUDY (A) 05/19/2016 0245   LABSPEC 1.019 05/19/2016 0245   PHURINE 6.0 05/19/2016 0245   GLUCOSEU NEGATIVE 05/19/2016 0245   HGBUR SMALL (A) 05/19/2016 0245   BILIRUBINUR NEGATIVE 05/19/2016 0245   KETONESUR NEGATIVE 05/19/2016 0245   PROTEINUR NEGATIVE 05/19/2016 0245   UROBILINOGEN 0.2 05/31/2015 1420   NITRITE POSITIVE (A) 05/19/2016 0245   LEUKOCYTESUR NEGATIVE 05/19/2016 0245   Sepsis Labs: '@LABRCNTIP'$ (procalcitonin:4,lacticidven:4)  ) Recent Results (from the past 240 hour(s))  Culture, Urine     Status: Abnormal (Preliminary result)   Collection Time: 05/19/16  2:45 AM  Result Value Ref Range Status   Specimen Description URINE, RANDOM  Final   Special Requests NONE  Final   Culture >=100,000 COLONIES/mL GRAM NEGATIVE RODS (A)  Final   Report Status PENDING  Incomplete  MRSA PCR Screening     Status: None   Collection Time: 05/19/16  9:52 AM  Result Value Ref Range Status   MRSA by PCR NEGATIVE NEGATIVE Final    Comment:        The GeneXpert MRSA Assay (FDA approved for NASAL specimens only), is one component of a comprehensive MRSA colonization surveillance program. It is not intended to diagnose MRSA infection nor to guide or monitor treatment for MRSA infections.   Culture, Urine     Status: Abnormal (Preliminary result)   Collection Time: 05/20/16 11:08 PM  Result Value Ref Range Status   Specimen Description URINE, RANDOM  Final   Special Requests NONE  Final   Culture 10,000 COLONIES/mL GRAM NEGATIVE RODS (A)  Final   Report Status PENDING  Incomplete         Radiology Studies: Mr Brain Wo Contrast  Result Date: 05/20/2016 CLINICAL DATA:  Seizure.  History of CVA with left-sided weakness. EXAM: MRI HEAD WITHOUT CONTRAST TECHNIQUE: Multiplanar, multiecho pulse sequences of the brain and surrounding structures were obtained without intravenous contrast. COMPARISON:  09/24/2015 and earlier FINDINGS: Brain: There is new T2 and  FLAIR hyperintensity centered on the genu of the corpus callosum and extending into the inferior frontal white matter/cortex, with swelling. These changes also continue along the hypothalamus and in the caudate heads. Minimal if any diffusion restriction. No evidence of blood products. There is edematous change also seen in the peripheral right cerebellum. Stable gliosis in the left parietal and parasagittal right frontal lobes. In the left  parietal region 08/10/2015 there was edema and enhancement which subsided. Inflammation or venous infarct were the primary considerations at that time. Current findings are presumably related to the same process. The dural venous sinuses have normal flow voids and the current cerebral edematous changes are not typical for veno-occlusive disease. A inflammatory/ demyelinating process is favored. CNS lymphoma is considered given the periventricular predominant findings in the cerebral hemispheres, but the cerebellar finding would not be typical. Cerebritis is also considered. Vascular: Normal flow voids Skull and upper cervical spine:  Negative. Sinuses/Orbits:Left mastoidectomy.  Chronic right mastoiditis. Other: These results were called by telephone at the time of interpretation on 05/20/2016 at 10:12 am to Dr. Gari Crown , who verbally acknowledged these results. IMPRESSION: New cerebral signal abnormality and swelling centered on the genu of the corpus callosum. Edematous change also present in the right cerebellum. Underlying pathology is presumably the same process as seen in the left parietal lobe 08/10/2015. Tumefactive demyelination, CNS lymphoma, or atypical infection are primary considerations. Veno-occlusive disease was implicated previously; dural venous sinuses have an unremarkable noncontrast appearance. Enhanced MRI is planned. Electronically Signed   By: Monte Fantasia M.D.   On: 05/20/2016 10:32   Mr Jeri Cos IW Contrast  Result Date: 05/21/2016 CLINICAL DATA:   70 year old female undergoing brain MRI with general anesthesia. Admitted with seizure and confusion. Abnormal signal in the brain, progressive since March 2016. Initial encounter. EXAM: MRI HEAD WITHOUT AND WITH CONTRAST TECHNIQUE: Multiplanar, multiecho pulse sequences of the brain and surrounding structures were obtained without and with intravenous contrast. CONTRAST:  75m MULTIHANCE GADOBENATE DIMEGLUMINE 529 MG/ML IV SOLN COMPARISON:  Recent brain MRI without contrast 05/20/2016. Brain MRIs with contrast 09/24/2015 and earlier. FINDINGS: Brain: No restricted diffusion or evidence of acute infarction. Confluent abnormal T2 and FLAIR hyperintensity involving the anterior inferior frontal gyri and the genu of the corpus callosum which is abnormally expanded, a new finding since March 2017. Associated scattered and confluent confluent nodular, as well as petechial enhancement throughout the inferior frontal gyri, and tracking into the right hypothalamus. Associated mild regional mass effect. Diffusion is facilitated in these areas. However, some of the nodular enhancing component in the left inferior frontal gyrus is dark on T2 signal (series 11, image 22). Superimposed large 3 cm area of abnormal enhancement in the posterior right cerebellum with confluent surrounding T2 and FLAIR hyperintensity. This enhancement appears both parenchymal and possibly leptomeningeal. Similar mild regional mass effect and facilitated diffusion in that region. In January 2017 there was confluent abnormal signal in the left superior parietal lobe which resembled vasogenic edema and was associated with abnormal nodular gyral enhancement at that time which is similar to the inferior frontal gyral enhancement seen today. However, that edema has resolved and there is now a small area of cortical encephalomalacia corresponding to the gyral enhancement at that time. In 2016 abnormal signal in the right peri-Rolandic cortex at the superior  frontal gyrus was noted and although the confluent T2 and FLAIR hyperintensity there persists, a small area of cortical encephalomalacia also developed. No pachymeningeal thickening. No abnormal dural thickening or enhancement elsewhere. No acute intracranial hemorrhage identified. No midline shift. No ventriculomegaly. Basilar cisterns remain patent. Negative pituitary. Negative cervicomedullary junction. Vascular: Major intracranial vascular flow voids are stable since 2016 and appear normal. Skull and upper cervical spine: Negative aside from previous postoperative changes. Visible cervical spinal cord is normal. Sinuses/Orbits: Stable mild mastoid effusions. Fluid in the pharynx in the setting of intubation. Mild left maxillary sinus  mucous retention cyst. Stable and negative orbit soft tissues. Other: Visible internal auditory structures appear normal. Negative scalp soft tissues. IMPRESSION: 1. Moderate to severe abnormal enhancement and signal abnormality within infiltrative appearance in both inferior frontal gyri, the genu of the corpus callosum, and separately in the right cerebellum. There is nonenhancing T2 signal abnormality extending into the right hypothalamus. Mild associated regional mass effect. Diffusion in these areas is facilitated, although some of the enhancing components are dark on T2 signal. 2. Evolution and/or regression of signal abnormality in the left parietal and superior right frontal lobes since January 2017 and March 2016, respectively. Small foci of cortical encephalomalacia developed at both areas. But while the left parietal FLAIR signal abnormality nearly resolved, that in the right frontal lobe did not. 3. 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 Other top differential diagnosis are Atypical CNS Infection and Neurosarcoidosis. Tumefactive demyelination is felt less likely. Glioblastoma or glioma ptosis is felt unlikely since the left parietal  involvement largely resolved. Electronically Signed   By: Genevie Ann M.D.   On: 05/21/2016 12:11        Scheduled Meds: . atorvastatin  20 mg Oral q1800  . carvedilol  3.125 mg Oral BID WC  . citalopram  20 mg Oral Daily  . enoxaparin (LOVENOX) injection  40 mg Subcutaneous Q24H  . fluticasone  1-2 spray Each Nare BID  . insulin aspart  0-9 Units Subcutaneous TID WC  . pantoprazole  40 mg Oral Daily  . topiramate  50 mg Oral BID   Continuous Infusions:    LOS: 3 days    Time spent: 40 minutes    Skyrah Krupp, Geraldo Docker, MD [facility] Pager [phone]   If 7PM-7AM, please contact night-coverage [email or web address] Password [email or web address] 05/22/2016, 9:11 AM

## 2016-05-22 NOTE — Consult Note (Signed)
Reason for Consult: Brain lesions, seizure Referring Physician: Dr. Yancey Flemings Kristi Kramer is an 70 y.o. female.  HPI: The patient is a 70 year old white female who I've known for years. Most recently she had a frontal brain lesion noted on his CT and MRI scan. I discussed the situation with the patient. We had her scheduled for a stereotactic brain biopsy but on the planning MRI of the lesion had disappeared.  More recently, the patient had a seizure witnessed by her boyfriend. She was brought to the ER and admitted for further workup. The workup included a brain MRI which demonstrated multiple intracranial lesions. A neurosurgical consultation was requested. The patient has been worked up further with a CT of the chest abdomen and pelvis which turned out negative for primary lesions.  Presently the patient is accompanied by a family friend and her boyfriend. She is alert and oriented 3. She has no complaints. She wants to go home. According to her boyfriend and friend she has been acting funny. She's had no further seizures in the hospital.  Past Medical History:  Diagnosis Date  . Anxiety   . Arthritis   . COPD (chronic obstructive pulmonary disease) (HCC)    sarcoidosis  . Diabetes mellitus   . GERD (gastroesophageal reflux disease)   . High cholesterol   . Hypertension   . Shortness of breath dyspnea   . Sleep apnea    cpap  . Stroke ([place])    x2 12, 16    Past Surgical History:  Procedure Laterality Date  . ABDOMINAL HYSTERECTOMY    . BACK SURGERY    . CHOLECYSTECTOMY    . EAR EXAMINATION UNDER ANESTHESIA Left    plate  . NECK SURGERY      Family History  Problem Relation Age of Onset  . Liver cancer Father     Social History:  reports that she has been smoking Cigarettes.  She has a 5.50 pack-year smoking history. She has never used smokeless tobacco. She reports that she does not drink alcohol or use drugs.  Allergies:  Allergies  Allergen Reactions  .  Mometasone Anaphylaxis and Rash    Turns real red Turns Red  . Codeine Nausea And Vomiting  . Sulfa Antibiotics Nausea And Vomiting    Medications:  I have reviewed the patient's current medications. Prior to Admission:  Prescriptions Prior to Admission  Medication Sig Dispense Refill Last Dose  . ADVAIR DISKUS 250-50 MCG/DOSE AEPB Inhale 1 puff into the lungs 2 (two) times daily.  11 05/18/2016 at Unknown time  . albuterol (PROVENTIL) (2.5 MG/3ML) 0.083% nebulizer solution Take 2.5 mg by nebulization 3 (three) times daily as needed for wheezing or shortness of breath.   unk  . ALPRAZolam (XANAX) 0.5 MG tablet Take 1 tablet (0.5 mg total) by mouth daily as needed. For anxiety.  0 unk  . aspirin EC 81 MG tablet Take 81 mg by mouth daily.    Past Month at Unknown time  . atorvastatin (LIPITOR) 20 MG tablet Take 20 mg by mouth daily.   1 Past Month at Unknown time  . citalopram (CELEXA) 20 MG tablet Take 20 mg by mouth daily.   05/18/2016 at Unknown time  . esomeprazole (NEXIUM) 20 MG capsule Take 40 mg by mouth daily at 12 noon.    05/18/2016 at Unknown time  . fluticasone (FLONASE) 50 MCG/ACT nasal spray Place 1-2 sprays into both nostrils 2 (two) times daily.   05/18/2016 at Unknown time  .  hydrochlorothiazide (HYDRODIURIL) 25 MG tablet Take 25 mg by mouth daily.   05/18/2016 at Unknown time  . lidocaine (LIDODERM) 5 % Place 1 patch onto the skin daily as needed (pain). Remove & Discard patch within 12 hours or as directed by MD   Past Week at Unknown time  . meloxicam (MOBIC) 7.5 MG tablet Take 7.5 mg by mouth daily.   05/18/2016 at Unknown time  . metFORMIN (GLUCOPHAGE) 1000 MG tablet Take 1,000 mg by mouth 2 (two) times daily with a meal.   3 05/18/2016 at Unknown time  . phenazopyridine (PYRIDIUM) 100 MG tablet Take 1 tablet (100 mg total) by mouth 3 (three) times daily as needed for pain. 10 tablet 0 unk  . promethazine (PHENERGAN) 12.5 MG tablet Take 12.5 mg by mouth every 6 (six)  hours as needed for nausea or vomiting.   Past Week at Unknown time  . topiramate (TOPAMAX) 50 MG tablet Take 1 tablet (50 mg total) by mouth daily. (Patient taking differently: Take 50 mg by mouth daily as needed (headaches). ) 30 tablet 3 Past Month at Unknown time  . traMADol (ULTRAM) 50 MG tablet Take 50 mg by mouth every 6 (six) hours as needed for moderate pain.   unk  . VENTOLIN HFA 108 (90 BASE) MCG/ACT inhaler Inhale 2 puffs into the lungs every 6 (six) hours as needed. wheezing  3 unk  . Vitamin D, Ergocalciferol, (DRISDOL) 50000 units CAPS capsule Take 50,000 Units by mouth every 7 (seven) days.   Past Week at Unknown time   Scheduled: . atorvastatin  20 mg Oral q1800  . carvedilol  3.125 mg Oral BID WC  . citalopram  20 mg Oral Daily  . enoxaparin (LOVENOX) injection  40 mg Subcutaneous Q24H  . fluticasone  1-2 spray Each Nare BID  . insulin aspart  0-9 Units Subcutaneous TID WC  . pantoprazole  40 mg Oral Daily  . topiramate  50 mg Oral BID   Continuous:  RSW:[id number], albuterol, ALPRAZolam, hydrALAZINE, ketorolac, LORazepam Anti-infectives    Start     Dose/Rate Route Frequency Ordered Stop   05/20/16 0800  cefTRIAXone (ROCEPHIN) 1 g in dextrose 5 % 50 mL IVPB     1 g 100 mL/hr over 30 Minutes Intravenous Every 24 hours 05/19/16 0529 05/21/16 2359   05/19/16 0430  cefTRIAXone (ROCEPHIN) 1 g in dextrose 5 % 50 mL IVPB     1 g 100 mL/hr over 30 Minutes Intravenous  Once 05/19/16 0423 05/19/16 0612       Results for orders placed or performed during the hospital encounter of 05/18/16 (from the past 48 hour(s))  Glucose, capillary     Status: Abnormal   Collection Time: 05/20/16  4:15 PM  Result Value Ref Range   Glucose-Capillary 116 (H) 65 - 99 mg/dL  Glucose, capillary     Status: Abnormal   Collection Time: 05/20/16 10:39 PM  Result Value Ref Range   Glucose-Capillary 114 (H) 65 - 99 mg/dL   Comment 1 Notify RN   Culture, Urine     Status: Abnormal  (Preliminary result)   Collection Time: 05/20/16 11:08 PM  Result Value Ref Range   Specimen Description URINE, RANDOM    Special Requests NONE    Culture 10,000 COLONIES/mL ESCHERICHIA COLI (A)    Report Status PENDING   Glucose, capillary     Status: Abnormal   Collection Time: 05/21/16  7:28 AM  Result Value Ref Range   Glucose-Capillary 117 (  H) 65 - 99 mg/dL  Glucose, capillary     Status: Abnormal   Collection Time: 05/21/16 10:33 AM  Result Value Ref Range   Glucose-Capillary 115 (H) 65 - 99 mg/dL   Comment 1 Notify RN   Glucose, capillary     Status: Abnormal   Collection Time: 05/21/16 11:55 AM  Result Value Ref Range   Glucose-Capillary 127 (H) 65 - 99 mg/dL  Glucose, capillary     Status: Abnormal   Collection Time: 05/21/16  5:28 PM  Result Value Ref Range   Glucose-Capillary 132 (H) 65 - 99 mg/dL  Glucose, capillary     Status: Abnormal   Collection Time: 05/21/16  9:16 PM  Result Value Ref Range   Glucose-Capillary 125 (H) 65 - 99 mg/dL   Comment 1 Notify RN    Comment 2 Document in Chart   Basic metabolic panel     Status: Abnormal   Collection Time: 05/22/16  2:55 AM  Result Value Ref Range   Sodium 136 135 - 145 mmol/L   Potassium 2.8 (L) 3.5 - 5.1 mmol/L   Chloride 102 101 - 111 mmol/L   CO2 25 22 - 32 mmol/L   Glucose, Bld 132 (H) 65 - 99 mg/dL   BUN 8 6 - 20 mg/dL   Creatinine, Ser 0.86 0.44 - 1.00 mg/dL   Calcium 9.3 8.9 - 10.3 mg/dL   GFR calc non Af Amer >60 >60 mL/min   GFR calc Af Amer >60 >60 mL/min    Comment: (NOTE) The eGFR has been calculated using the CKD EPI equation. This calculation has not been validated in all clinical situations. eGFR's persistently <60 mL/min signify possible Chronic Kidney Disease.    Anion gap 9 5 - 15  CBC     Status: None   Collection Time: 05/22/16  2:55 AM  Result Value Ref Range   WBC 7.8 4.0 - 10.5 K/uL   RBC 4.31 3.87 - 5.11 MIL/uL   Hemoglobin 12.5 12.0 - 15.0 g/dL   HCT 36.2 36.0 - 46.0 %   MCV  84.0 78.0 - 100.0 fL   MCH 29.0 26.0 - 34.0 pg   MCHC 34.5 30.0 - 36.0 g/dL   RDW 12.6 11.5 - 15.5 %   Platelets 240 150 - 400 K/uL  Protime-INR     Status: None   Collection Time: 05/22/16  2:55 AM  Result Value Ref Range   Prothrombin Time 14.1 11.4 - 15.2 seconds   INR 1.08   Glucose, capillary     Status: Abnormal   Collection Time: 05/22/16  8:09 AM  Result Value Ref Range   Glucose-Capillary 123 (H) 65 - 99 mg/dL  Glucose, capillary     Status: Abnormal   Collection Time: 05/22/16 12:38 PM  Result Value Ref Range   Glucose-Capillary 153 (H) 65 - 99 mg/dL    Ct Chest W Contrast  Result Date: 05/22/2016 CLINICAL DATA:  History of waxing and waning abnormal intracranial enhancement, suspect brain tumor, possible metastasis. History of hypertension, diabetes, stroke. History of cholecystectomy, appendectomy and hysterectomy. EXAM: CT CHEST, ABDOMEN, AND PELVIS WITH CONTRAST TECHNIQUE: Multidetector CT imaging of the chest, abdomen and pelvis was performed following the standard protocol during bolus administration of intravenous contrast. CONTRAST:  100 cc ISOVUE-300 IOPAMIDOL (ISOVUE-300) INJECTION 61% COMPARISON:  MRI head May 21, 2016 and multiple priors. CT abdomen and pelvis May 29, 2015. FINDINGS: CT CHEST FINDINGS CARDIOVASCULAR: Heart size is normal. Mild coronary artery calcifications. Mild  pericardial wall thickening. Thoracic aorta is normal course and caliber, mild calcific atherosclerosis. MEDIASTINUM/NODES: No mediastinal mass. No lymphadenopathy by CT size criteria. Small calcified LEFT hilar lymph nodes. Normal appearance of thoracic esophagus though not tailored for evaluation. Increasingly prominent chylous duct at the crus of the diaphragm. LUNGS/PLEURA: Tracheobronchial tree is patent, no pneumothorax. Scattered calcified granulomas. Enhancing dependent atelectasis. Trace pleural effusions. No focal consolidation, nodules or masses. MUSCULOSKELETAL: Mild T3, mild  to moderate T4 through T6 and mild T7 age indeterminate compression fractures. No destructive bony lesions. ACDF. Mildly widened RIGHT acromioclavicular joint space most compatible with old injury. CT ABDOMEN AND PELVIS FINDINGS HEPATOBILIARY: Liver is normal.  Status post cholecystectomy. PANCREAS: Normal. SPLEEN: Normal. ADRENALS/URINARY TRACT: Kidneys are orthotopic, demonstrating symmetric enhancement. Too small to characterize RIGHT interpolar hypodensity. 1 cm LEFT lower pole and RIGHT interpolar cysts. No nephrolithiasis, hydronephrosis or solid renal masses. The unopacified ureters are normal in course and caliber. Delayed imaging through the kidneys demonstrates symmetric prompt contrast excretion within the proximal urinary collecting system. Urinary bladder is partially distended and unremarkable. Normal adrenal glands. STOMACH/BOWEL: The stomach, small and large bowel are normal in course and caliber without inflammatory changes. Moderate descending and sigmoid diverticulosis. Mild amount of retained large bowel stool. VASCULAR/LYMPHATIC: Aortoiliac vessels are normal in course and caliber, moderate calcific atherosclerosis. No lymphadenopathy by CT size criteria. REPRODUCTIVE: Status post hysterectomy. OTHER: No intraperitoneal free fluid or free air. MUSCULOSKELETAL: Non-acute. Gluteal injection granulomas. Anterior abdominal wall ligamentous laxity. L2 benign hemangioma. Mild lower lumbar facet arthropathy. Anterior pelvic wall scarring. IMPRESSION: CT CHEST:  No CT findings of neoplasm in the chest. Trace pleural effusions and bibasilar atelectasis. Old granulomatous disease. Old multiple mild and mild to moderate age indeterminate upper thoracic compression fractures. CT ABDOMEN AND PELVIS: No CT findings of neoplasm in the abdomen or pelvis. Diverticulosis without acute diverticulitis. Status post cholecystectomy, appendectomy and hysterectomy. Electronically Signed   By: Elon Alas M.D.    On: 05/22/2016 14:17   Mr Jeri Cos HY Contrast  Result Date: 05/21/2016 CLINICAL DATA:  70 year old female undergoing brain MRI with general anesthesia. Admitted with seizure and confusion. Abnormal signal in the brain, progressive since March 2016. Initial encounter. EXAM: MRI HEAD WITHOUT AND WITH CONTRAST TECHNIQUE: Multiplanar, multiecho pulse sequences of the brain and surrounding structures were obtained without and with intravenous contrast. CONTRAST:  36m MULTIHANCE GADOBENATE DIMEGLUMINE 529 MG/ML IV SOLN COMPARISON:  Recent brain MRI without contrast 05/20/2016. Brain MRIs with contrast 09/24/2015 and earlier. FINDINGS: Brain: No restricted diffusion or evidence of acute infarction. Confluent abnormal T2 and FLAIR hyperintensity involving the anterior inferior frontal gyri and the genu of the corpus callosum which is abnormally expanded, a new finding since March 2017. Associated scattered and confluent confluent nodular, as well as petechial enhancement throughout the inferior frontal gyri, and tracking into the right hypothalamus. Associated mild regional mass effect. Diffusion is facilitated in these areas. However, some of the nodular enhancing component in the left inferior frontal gyrus is dark on T2 signal (series 11, image 22). Superimposed large 3 cm area of abnormal enhancement in the posterior right cerebellum with confluent surrounding T2 and FLAIR hyperintensity. This enhancement appears both parenchymal and possibly leptomeningeal. Similar mild regional mass effect and facilitated diffusion in that region. In January 2017 there was confluent abnormal signal in the left superior parietal lobe which resembled vasogenic edema and was associated with abnormal nodular gyral enhancement at that time which is similar to the inferior frontal gyral enhancement seen  today. However, that edema has resolved and there is now a small area of cortical encephalomalacia corresponding to the gyral  enhancement at that time. In 2016 abnormal signal in the right peri-Rolandic cortex at the superior frontal gyrus was noted and although the confluent T2 and FLAIR hyperintensity there persists, a small area of cortical encephalomalacia also developed. No pachymeningeal thickening. No abnormal dural thickening or enhancement elsewhere. No acute intracranial hemorrhage identified. No midline shift. No ventriculomegaly. Basilar cisterns remain patent. Negative pituitary. Negative cervicomedullary junction. Vascular: Major intracranial vascular flow voids are stable since 2016 and appear normal. Skull and upper cervical spine: Negative aside from previous postoperative changes. Visible cervical spinal cord is normal. Sinuses/Orbits: Stable mild mastoid effusions. Fluid in the pharynx in the setting of intubation. Mild left maxillary sinus mucous retention cyst. Stable and negative orbit soft tissues. Other: Visible internal auditory structures appear normal. Negative scalp soft tissues. IMPRESSION: 1. Moderate to severe abnormal enhancement and signal abnormality within infiltrative appearance in both inferior frontal gyri, the genu of the corpus callosum, and separately in the right cerebellum. There is nonenhancing T2 signal abnormality extending into the right hypothalamus. Mild associated regional mass effect. Diffusion in these areas is facilitated, although some of the enhancing components are dark on T2 signal. 2. Evolution and/or regression of signal abnormality in the left parietal and superior right frontal lobes since January 2017 and March 2016, respectively. Small foci of cortical encephalomalacia developed at both areas. But while the left parietal FLAIR signal abnormality nearly resolved, that in the right frontal lobe did not. 3. 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 Other top differential diagnosis are Atypical CNS Infection and Neurosarcoidosis. Tumefactive  demyelination is felt less likely. Glioblastoma or glioma ptosis is felt unlikely since the left parietal involvement largely resolved. Electronically Signed   By: Genevie Ann M.D.   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 History of hypertension, diabetes, stroke. History of cholecystectomy, appendectomy and hysterectomy. EXAM: CT CHEST, ABDOMEN, AND PELVIS WITH CONTRAST TECHNIQUE: Multidetector CT imaging of the chest, abdomen and pelvis was performed following the standard protocol during bolus administration of intravenous contrast. CONTRAST:  100 cc ISOVUE-300 IOPAMIDOL (ISOVUE-300) INJECTION 61% COMPARISON:  MRI head May 21, 2016 and multiple priors. CT abdomen and pelvis May 29, 2015. FINDINGS: CT CHEST FINDINGS CARDIOVASCULAR: Heart size is normal. Mild coronary artery calcifications. Mild pericardial wall thickening. Thoracic aorta is normal course and caliber, mild calcific atherosclerosis. MEDIASTINUM/NODES: No mediastinal mass. No lymphadenopathy by CT size criteria. Small calcified LEFT hilar lymph nodes. Normal appearance of thoracic esophagus though not tailored for evaluation. Increasingly prominent chylous duct at the crus of the diaphragm. LUNGS/PLEURA: Tracheobronchial tree is patent, no pneumothorax. Scattered calcified granulomas. Enhancing dependent atelectasis. Trace pleural effusions. No focal consolidation, nodules or masses. MUSCULOSKELETAL: Mild T3, mild to moderate T4 through T6 and mild T7 age indeterminate compression fractures. No destructive bony lesions. ACDF. Mildly widened RIGHT acromioclavicular joint space most compatible with old injury. CT ABDOMEN AND PELVIS FINDINGS HEPATOBILIARY: Liver is normal.  Status post cholecystectomy. PANCREAS: Normal. SPLEEN: Normal. ADRENALS/URINARY TRACT: Kidneys are orthotopic,  demonstrating symmetric enhancement. Too small to characterize RIGHT interpolar hypodensity. 1 cm LEFT lower pole and RIGHT interpolar cysts. No nephrolithiasis, hydronephrosis or solid renal masses. The unopacified ureters are normal in course and caliber. Delayed imaging through the  kidneys demonstrates symmetric prompt contrast excretion within the proximal urinary collecting system. Urinary bladder is partially distended and unremarkable. Normal adrenal glands. STOMACH/BOWEL: The stomach, small and large bowel are normal in course and caliber without inflammatory changes. Moderate descending and sigmoid diverticulosis. Mild amount of retained large bowel stool. VASCULAR/LYMPHATIC: Aortoiliac vessels are normal in course and caliber, moderate calcific atherosclerosis. No lymphadenopathy by CT size criteria. REPRODUCTIVE: Status post hysterectomy. OTHER: No intraperitoneal free fluid or free air. MUSCULOSKELETAL: Non-acute. Gluteal injection granulomas. Anterior abdominal wall ligamentous laxity. L2 benign hemangioma. Mild lower lumbar facet arthropathy. Anterior pelvic wall scarring. IMPRESSION: CT CHEST:  No CT findings of neoplasm in the chest. Trace pleural effusions and bibasilar atelectasis. Old granulomatous disease. Old multiple mild and mild to moderate age indeterminate upper thoracic compression fractures. CT ABDOMEN AND PELVIS: No CT findings of neoplasm in the abdomen or pelvis. Diverticulosis without acute diverticulitis. Status post cholecystectomy, appendectomy and hysterectomy. Electronically Signed   By: Elon Alas M.D.   On: 05/22/2016 14:17    ROS: As above Blood pressure (!) 157/82, pulse 69, temperature 98 F (36.7 C), temperature source Oral, resp. rate 20, height _0  (1.651 m), weight 94.3 kg (208 lb), SpO2 99 %. Physical Exam  General: an alert obese and pleasant 70 year old white female in no apparent distress.  HEENT: Normocephalic, atraumatic, pupils equal round  reactive to light, extraocular muscles intact  Neck: Supple  Extremities: Unremarkable  Thorax: Symmetric  Abdomen obese and soft  Neurologic exam the patient is alert and oriented 3. Glasgow Coma Scale 15. Cranial nerves II through XII are examined bilaterally and grossly normal. Vision and hearing are grossly normal bilaterally. Motor strength is 5 over 5 in her bowel bicep, tricep, hand grip, quadriceps, gastrocnemius. Cerebellar function is intact to rapid alternating movements of the upper extremities bilaterally. Sensory function is intact to light touch sensation in all tested dermatomes bilaterally.   I have reviewed the patient's brain MRI with and without contrast performed at [facility] on 05/21/2016. It demonstrates the patient has multiple intracranial lesions including inferior frontal, hypothalamus, cerebellum, etc. None of which have significant mass effect. There is surrounding edema.  I have reviewed the report of the patient's CT of the chest abdomen and pelvis done today. It does not demonstrate any primary lesions.  Assessment/Plan: Multiple intracranial lesions: I have discussed the situation with the patient, her boyfriend and friend. 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 We have discussed the various treatment options clothing doing nothing, continued medical management, empiric treatment, and biopsy of one of these lesions. I recommended the latter. The most accessible lesion is her right cerebellar lesion. I have described a suboccipital craniectomy for open biopsy/resection of this tumor. We have discussed the risks of surgery including risks of anesthesia, hemorrhage, infection, spinal fluid leak, medical risk, etc. I have asked her all their questions. She wants to proceed with surgery. We will tentatively plan to do this on Monday.  The patient asked if she could be discharged to home and  come back on Monday to have her surgery. That is fine with me. She will need to be discharged on anticonvulsants. I would not send her home on steroids as we don't want to have this lesion disappear again before we can get a biopsy.  Julius Matus D 05/22/2016, 3:00 PM

## 2016-05-23 ENCOUNTER — Encounter (HOSPITAL_COMMUNITY): Payer: Self-pay | Admitting: Anesthesiology

## 2016-05-23 DIAGNOSIS — R7989 Other specified abnormal findings of blood chemistry: Secondary | ICD-10-CM

## 2016-05-23 DIAGNOSIS — R748 Abnormal levels of other serum enzymes: Secondary | ICD-10-CM

## 2016-05-23 DIAGNOSIS — Z515 Encounter for palliative care: Secondary | ICD-10-CM

## 2016-05-23 DIAGNOSIS — Z01818 Encounter for other preprocedural examination: Secondary | ICD-10-CM

## 2016-05-23 DIAGNOSIS — G4733 Obstructive sleep apnea (adult) (pediatric): Secondary | ICD-10-CM

## 2016-05-23 DIAGNOSIS — E118 Type 2 diabetes mellitus with unspecified complications: Secondary | ICD-10-CM

## 2016-05-23 DIAGNOSIS — I10 Essential (primary) hypertension: Secondary | ICD-10-CM

## 2016-05-23 DIAGNOSIS — R778 Other specified abnormalities of plasma proteins: Secondary | ICD-10-CM

## 2016-05-23 DIAGNOSIS — R938 Abnormal findings on diagnostic imaging of other specified body structures: Secondary | ICD-10-CM

## 2016-05-23 LAB — BASIC METABOLIC PANEL
ANION GAP: 9 (ref 5–15)
BUN: 10 mg/dL (ref 6–20)
CALCIUM: 9.6 mg/dL (ref 8.9–10.3)
CO2: 23 mmol/L (ref 22–32)
Chloride: 103 mmol/L (ref 101–111)
Creatinine, Ser: 0.81 mg/dL (ref 0.44–1.00)
GFR calc non Af Amer: >60 mL/min (ref >60)
Glucose, Bld: 131 mg/dL — ABNORMAL HIGH (ref 65–99)
Potassium: 3.1 mmol/L — ABNORMAL LOW (ref 3.5–5.1)
Sodium: 135 mmol/L (ref 135–145)

## 2016-05-23 LAB — URINE CULTURE

## 2016-05-23 LAB — GLUCOSE, CAPILLARY
GLUCOSE-CAPILLARY: 151 mg/dL — AB (ref 65–99)
GLUCOSE-CAPILLARY: 115 mg/dL — AB (ref 65–99)
Glucose-Capillary: 117 mg/dL — ABNORMAL HIGH (ref 65–99)
Glucose-Capillary: 173 mg/dL — ABNORMAL HIGH (ref 65–99)

## 2016-05-23 LAB — TROPONIN I
TROPONIN I: 0.11 ng/mL — AB (ref <0.03)
Troponin I: 0.1 ng/mL (ref <0.03)

## 2016-05-23 LAB — MAGNESIUM: MAGNESIUM: 1.6 mg/dL — AB (ref 1.7–2.4)

## 2016-05-23 MED ORDER — LEVETIRACETAM 500 MG PO TABS
500.0000 mg | ORAL_TABLET | Freq: Two times a day (BID) | ORAL | Status: DC
  Administered 2016-05-23 – 2016-05-28 (×10): 500 mg via ORAL
  Filled 2016-05-23 (×10): qty 1

## 2016-05-23 MED ORDER — MAGNESIUM SULFATE 2 GM/50ML IV SOLN
2.0000 g | Freq: Once | INTRAVENOUS | Status: AC
  Administered 2016-05-23: 2 g via INTRAVENOUS
  Filled 2016-05-23: qty 50

## 2016-05-23 MED ORDER — LISINOPRIL 2.5 MG PO TABS
2.5000 mg | ORAL_TABLET | Freq: Every day | ORAL | Status: DC
  Administered 2016-05-24 – 2016-05-28 (×4): 2.5 mg via ORAL
  Filled 2016-05-23 (×5): qty 1

## 2016-05-23 MED ORDER — POTASSIUM CHLORIDE CRYS ER 20 MEQ PO TBCR
50.0000 meq | EXTENDED_RELEASE_TABLET | Freq: Once | ORAL | Status: AC
  Administered 2016-05-23: 50 meq via ORAL
  Filled 2016-05-23: qty 2

## 2016-05-23 MED ORDER — CARVEDILOL 3.125 MG PO TABS
3.1250 mg | ORAL_TABLET | Freq: Two times a day (BID) | ORAL | Status: DC
  Administered 2016-05-23: 3.125 mg via ORAL

## 2016-05-23 MED ORDER — FOSFOMYCIN TROMETHAMINE 3 G PO PACK
3.0000 g | PACK | ORAL | Status: AC
  Administered 2016-05-23 – 2016-05-27 (×3): 3 g via ORAL
  Filled 2016-05-23 (×4): qty 3

## 2016-05-23 NOTE — Care Management Important Message (Signed)
Important Message  Patient Details  Name: Kristi Kramer MRN: [id number] Date of Birth: [birth year]   Medicare Important Message Given:  Yes    Nathen May 05/23/2016, 10:33 AM

## 2016-05-23 NOTE — Consult Note (Signed)
CARDIOLOGY CONSULT NOTE   Patient ID: Kristi Kramer MRN: [id number] DOB/AGE: 1946/06/22 70 y.o.  Admit date: 05/18/2016  Requesting Physician: Dr. Sherral Hammers Primary Physician:   Cyndi Bender, PA-C Primary Cardiologist:  New Reason for Consultation:  Elevated troponin. Pre op clearance prior to brain biopsy.   HPI: Kristi Kramer is a 70 y.o. 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 She was found to have multiple intracranial lesions requiring biopsy. Cardiology asked for pre operative clearance.   The patient has been followed by Dr. Tomi Likens of [place] [facility] and was supposed to have neurosurgery by Dr. Arnoldo Morale, but the surgery was canceled due to MRI showing interval improvement on the enhancing lesions and decreased vasogenic edema after being treated with steroids.   Admitted in 09/2015 for blurred vision, dizziness and HA. 2D ECHO at that time showed normal LV function and mild MR.   Admitted in 12/2015 for acute encephalopathy and possible syncope.  Patient's boyfriend saw the patient had tonic-clonic seizure. EMS was called and after EMS arrival patient had another tonic-clonic seizure and was given Midazolam.Patient was postictal in the ER on arrival. CT head was unremarkable. Neurologist was consulted and patient was placed on loading dose of Keppra. After neurological evaluation patient was placed on increased dose of Topamax which patient usually takes for headaches.   In the ER she was noted to be hypokalemic with an elevated lactate to 2.2. Troponin I 1.21--> 1.25.   The workup included a brain MRI which demonstrated multiple intracranial lesions.The patient has been worked up further with a CT of the chest abdomen and pelvis which turned out negative for primary lesions.  A neurosurgical consultation was  requested and Dr. 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 Plan is for suboccipital craniectomy for open biopsy/resection of this tumor on Monday. Cardiology asked for pre operative clearance.   Today she is alert and oriented sitting in bed eating lunch. She doesn't remember much around the time of her recent seizure. Now doing okay. She does report a history of chest pain that started about a month ago after falling out of a trailer while at the beach. She has had intermittent chest pain since that time that feels like a heaviness and is associated with SOB. The chest pain is worse with walking around, certain movements and also worse with palpation. She has some intermittent LE edema and orthopnea. No PND. She has dizziness but no recent syncope. No real strong family history of CAD that she can remember. She used to smoke about 1PPD but quit 1 month ago. Currently has some chest pain that is mild while laying in the bed.    Past Medical History:  Diagnosis Date  . Anxiety   . Arthritis   . COPD (chronic obstructive pulmonary disease) (HCC)    sarcoidosis  . Diabetes mellitus   . GERD (gastroesophageal reflux disease)   . High cholesterol   . Hypertension   . Shortness of breath dyspnea   . Sleep apnea    cpap  . Stroke ([place])    x2 12, 16     Past Surgical History:  Procedure Laterality Date  . ABDOMINAL HYSTERECTOMY    . BACK SURGERY    . CHOLECYSTECTOMY    . EAR EXAMINATION UNDER ANESTHESIA Left  plate  . NECK SURGERY    . RADIOLOGY WITH ANESTHESIA N/A 05/21/2016   Procedure: RADIOLOGY WITH ANESTHESIA - MRI;  Surgeon: Medication Radiologist, MD;  [facility];  Service: Radiology;  Laterality: N/A;    Allergies  Allergen Reactions  . Mometasone Anaphylaxis and Rash    Turns real red Turns Red  . Codeine Nausea And Vomiting  . Sulfa Antibiotics Nausea And Vomiting    I have reviewed the patient's current medications . atorvastatin  20 mg  Oral q1800  . carvedilol  3.125 mg Oral BID WC  . citalopram  20 mg Oral Daily  . enoxaparin (LOVENOX) injection  40 mg Subcutaneous Q24H  . fluticasone  1-2 spray Each Nare BID  . fosfomycin  3 g Oral Q48H  . insulin aspart  0-9 Units Subcutaneous TID WC  . levETIRAcetam  500 mg Oral BID  . pantoprazole  40 mg Oral Daily  . topiramate  50 mg Oral BID     acetaminophen, albuterol, ALPRAZolam, hydrALAZINE, ketorolac, LORazepam  Prior to Admission medications   Medication Sig Start Date End Date Taking? Authorizing Provider  ADVAIR DISKUS 250-50 MCG/DOSE AEPB Inhale 1 puff into the lungs 2 (two) times daily. 11/27/15  Yes Historical Provider, MD  albuterol (PROVENTIL) (2.5 MG/3ML) 0.083% nebulizer solution Take 2.5 mg by nebulization 3 (three) times daily as needed for wheezing or shortness of breath.   Yes Historical Provider, MD  ALPRAZolam Duanne Moron) 0.5 MG tablet Take 1 tablet (0.5 mg total) by mouth daily as needed. For anxiety. 12/28/15  Yes Lavina Hamman, MD  aspirin EC 81 MG tablet Take 81 mg by mouth daily.    Yes Historical Provider, MD  atorvastatin (LIPITOR) 20 MG tablet Take 20 mg by mouth daily.  06/02/15  Yes Historical Provider, MD  citalopram (CELEXA) 20 MG tablet Take 20 mg by mouth daily.   Yes Historical Provider, MD  esomeprazole (NEXIUM) 20 MG capsule Take 40 mg by mouth daily at 12 noon.    Yes Historical Provider, MD  fluticasone (FLONASE) 50 MCG/ACT nasal spray Place 1-2 sprays into both nostrils 2 (two) times daily.   Yes Historical Provider, MD  hydrochlorothiazide (HYDRODIURIL) 25 MG tablet Take 25 mg by mouth daily.   Yes Historical Provider, MD  lidocaine (LIDODERM) 5 % Place 1 patch onto the skin daily as needed (pain). Remove & Discard patch within 12 hours or as directed by MD   Yes Historical Provider, MD  meloxicam (MOBIC) 7.5 MG tablet Take 7.5 mg by mouth daily.   Yes Historical Provider, MD  metFORMIN (GLUCOPHAGE) 1000 MG tablet Take 1,000 mg by mouth 2 (two)  times daily with a meal.  06/02/15  Yes Historical Provider, MD  phenazopyridine (PYRIDIUM) 100 MG tablet Take 1 tablet (100 mg total) by mouth 3 (three) times daily as needed for pain. 12/28/15  Yes Lavina Hamman, MD  promethazine (PHENERGAN) 12.5 MG tablet Take 12.5 mg by mouth every 6 (six) hours as needed for nausea or vomiting.   Yes Historical Provider, MD  topiramate (TOPAMAX) 50 MG tablet Take 1 tablet (50 mg total) by mouth daily. Patient taking differently: Take 50 mg by mouth daily as needed (headaches).  03/11/16  Yes Adam Telford Nab, DO  traMADol (ULTRAM) 50 MG tablet Take 50 mg by mouth every 6 (six) hours as needed for moderate pain.   Yes Historical Provider, MD  VENTOLIN HFA 108 (90 BASE) MCG/ACT inhaler Inhale 2 puffs into the lungs every  6 (six) hours as needed. wheezing 04/10/15  Yes Historical Provider, MD  Vitamin D, Ergocalciferol, (DRISDOL) 50000 units CAPS capsule Take 50,000 Units by mouth every 7 (seven) days.   Yes Historical Provider, MD     Social History   Social History  . Marital status: Divorced    Spouse name: N/A  . Number of children: N/A  . Years of education: N/A   Occupational History  . Disabled Not Employed   Social History Main Topics  . Smoking status: Light Tobacco Smoker    Packs/day: 0.25    Years: 22.00    Types: Cigarettes  . Smokeless tobacco: Never Used     Comment: wants patches to quit  . Alcohol use No  . Drug use: No  . Sexual activity: No   Other Topics Concern  . Not on file   Social History Narrative  . No narrative on file    Family Status  Relation Status  . Father Deceased  . Sister Deceased   CHF   Family History  Problem Relation Age of Onset  . Liver cancer Father        ROS:  Full 14 point review of systems complete and found to be negative unless listed above.  Physical Exam: Blood pressure 93/62, pulse 66, temperature 98.5 F (36.9 C), temperature source Oral, resp. rate (!) 22, height '5\' 5"'$  (1.651 m),  weight 208 lb (94.3 kg), SpO2 100 %.  General: Well developed, well nourished, female in no acute distress, obese Head: Eyes PERRLA, No xanthomas.   Normocephalic and atraumatic, oropharynx without edema or exudate. Dentition:  Lungs: CTAB Heart: HRRR S1 S2, no rub/gallop, Heart regular rate and rhythm with S1, S2  murmur. pulses are 2+ extrem.   Chest mildly tender to palpation Neck: No carotid bruits. No lymphadenopathy.  No JVD. Abdomen: Bowel sounds present, abdomen soft and non-tender without masses or hernias noted. Msk:  No spine or cva tenderness. No weakness, no joint deformities or effusions. Extremities: No clubbing or cyanosis. No LE  edema.  Neuro: Alert and oriented X 3. No focal deficits noted. Psych:  Good affect, responds appropriately Skin: No rashes or lesions noted.  Labs:   Lab Results  Component Value Date   WBC 7.8 05/22/2016   HGB 12.5 05/22/2016   HCT 36.2 05/22/2016   MCV 84.0 05/22/2016   PLT 240 05/22/2016    Recent Labs  05/22/16 0255  INR 1.08    Recent Labs Lab 05/20/16 0650 05/22/16 0255  NA 140 136  K 3.3* 2.8*  CL 108 102  CO2 24 25  BUN 16 8  CREATININE 0.88 0.86  CALCIUM 9.1 9.3  PROT 5.9*  --   BILITOT 0.9  --   ALKPHOS 69  --   ALT 13*  --   AST 29  --   GLUCOSE 122* 132*  ALBUMIN 3.1*  --    Magnesium  Date Value Ref Range Status  05/19/2016 1.9 1.7 - 2.4 mg/dL Final   No results for input(s): CKTOTAL, CKMB, TROPONINI in the last 72 hours. No results for input(s): TROPIPOC in the last 72 hours. Pro B Natriuretic peptide (BNP)  Date/Time Value Ref Range Status  06/01/2013 09:35 PM 37.1 0 - 125 pg/mL Final   Lab Results  Component Value Date   CHOL 133 03/11/2016   HDL 44.50 03/11/2016   LDLCALC 52 03/11/2016   TRIG 182.0 (H) 03/11/2016   No results found for: DDIMER Lipase  Date/Time  Value Ref Range Status  05/31/2015 01:00 PM 32 11 - 51 U/L Final   TSH  Date/Time Value Ref Range Status  09/27/2015 01:40 PM  0.760 0.350 - 4.500 uIU/mL Final  07/24/2011 06:28 AM 2.661 0.350 - 4.500 uIU/mL Final   No results found for: VITAMINB12, FOLATE, FERRITIN, TIBC, IRON, RETICCTPCT  Echo: 09/27/2015 LV EF: 55% -   60% Study Conclusions - Left ventricle: The cavity size was normal. Wall thickness was   increased in a pattern of mild LVH. Systolic function was normal.   The estimated ejection fraction was in the range of 55% to 60%.   Wall motion was normal; there were no regional wall motion   abnormalities. - Mitral valve: There was mild regurgitation.  ECG:  NSR HR 71 with mild diffuse ST depression  Radiology:  Ct Chest W Contrast  Result Date: 05/22/2016 CLINICAL DATA:  History of waxing and waning abnormal intracranial enhancement, suspect brain tumor, possible metastasis. History of hypertension, diabetes, stroke. History of cholecystectomy, appendectomy and hysterectomy. EXAM: CT CHEST, ABDOMEN, AND PELVIS WITH CONTRAST TECHNIQUE: Multidetector CT imaging of the chest, abdomen and pelvis was performed following the standard protocol during bolus administration of intravenous contrast. CONTRAST:  100 cc ISOVUE-300 IOPAMIDOL (ISOVUE-300) INJECTION 61% COMPARISON:  MRI head May 21, 2016 and multiple priors. CT abdomen and pelvis May 29, 2015. FINDINGS: CT CHEST FINDINGS CARDIOVASCULAR: Heart size is normal. Mild coronary artery calcifications. Mild pericardial wall thickening. Thoracic aorta is normal course and caliber, mild calcific atherosclerosis. MEDIASTINUM/NODES: No mediastinal mass. No lymphadenopathy by CT size criteria. Small calcified LEFT hilar lymph nodes. Normal appearance of thoracic esophagus though not tailored for evaluation. Increasingly prominent chylous duct at the crus of the diaphragm. LUNGS/PLEURA: Tracheobronchial tree is patent, no pneumothorax. Scattered calcified granulomas. Enhancing dependent atelectasis. Trace pleural effusions. No focal consolidation, nodules or masses.  MUSCULOSKELETAL: Mild T3, mild to moderate T4 through T6 and mild T7 age indeterminate compression fractures. No destructive bony lesions. ACDF. Mildly widened RIGHT acromioclavicular joint space most compatible with old injury. CT ABDOMEN AND PELVIS FINDINGS HEPATOBILIARY: Liver is normal.  Status post cholecystectomy. PANCREAS: Normal. SPLEEN: Normal. ADRENALS/URINARY TRACT: Kidneys are orthotopic, demonstrating symmetric enhancement. Too small to characterize RIGHT interpolar hypodensity. 1 cm LEFT lower pole and RIGHT interpolar cysts. No nephrolithiasis, hydronephrosis or solid renal masses. The unopacified ureters are normal in course and caliber. Delayed imaging through the kidneys demonstrates symmetric prompt contrast excretion within the proximal urinary collecting system. Urinary bladder is partially distended and unremarkable. Normal adrenal glands. STOMACH/BOWEL: The stomach, small and large bowel are normal in course and caliber without inflammatory changes. Moderate descending and sigmoid diverticulosis. Mild amount of retained large bowel stool. VASCULAR/LYMPHATIC: Aortoiliac vessels are normal in course and caliber, moderate calcific atherosclerosis. No lymphadenopathy by CT size criteria. REPRODUCTIVE: Status post hysterectomy. OTHER: No intraperitoneal free fluid or free air. MUSCULOSKELETAL: Non-acute. Gluteal injection granulomas. Anterior abdominal wall ligamentous laxity. L2 benign hemangioma. Mild lower lumbar facet arthropathy. Anterior pelvic wall scarring. IMPRESSION: CT CHEST:  No CT findings of neoplasm in the chest. Trace pleural effusions and bibasilar atelectasis. Old granulomatous disease. Old multiple mild and mild to moderate age indeterminate upper thoracic compression fractures. CT ABDOMEN AND PELVIS: No CT findings of neoplasm in the abdomen or pelvis. Diverticulosis without acute diverticulitis. Status post cholecystectomy, appendectomy and hysterectomy. Electronically Signed    By: Elon Alas M.D.   On: 05/22/2016 14:17   Ct Abdomen Pelvis W Contrast  Result Date: 05/22/2016 CLINICAL DATA:  History of waxing and waning abnormal intracranial enhancement, suspect brain tumor, possible metastasis. History of hypertension, diabetes, stroke. History of cholecystectomy, appendectomy and hysterectomy. EXAM: CT CHEST, ABDOMEN, AND PELVIS WITH CONTRAST TECHNIQUE: Multidetector CT imaging of the chest, abdomen and pelvis was performed following the standard protocol during bolus administration of intravenous contrast. CONTRAST:  100 cc ISOVUE-300 IOPAMIDOL (ISOVUE-300) INJECTION 61% COMPARISON:  MRI head May 21, 2016 and multiple priors. CT abdomen and pelvis May 29, 2015. FINDINGS: CT CHEST FINDINGS CARDIOVASCULAR: Heart size is normal. Mild coronary artery calcifications. Mild pericardial wall thickening. Thoracic aorta is normal course and caliber, mild calcific atherosclerosis. MEDIASTINUM/NODES: No mediastinal mass. No lymphadenopathy by CT size criteria. Small calcified LEFT hilar lymph nodes. Normal appearance of thoracic esophagus though not tailored for evaluation. Increasingly prominent chylous duct at the crus of the diaphragm. LUNGS/PLEURA: Tracheobronchial tree is patent, no pneumothorax. Scattered calcified granulomas. Enhancing dependent atelectasis. Trace pleural effusions. No focal consolidation, nodules or masses. MUSCULOSKELETAL: Mild T3, mild to moderate T4 through T6 and mild T7 age indeterminate compression fractures. No destructive bony lesions. ACDF. Mildly widened RIGHT acromioclavicular joint space most compatible with old injury. CT ABDOMEN AND PELVIS FINDINGS HEPATOBILIARY: Liver is normal.  Status post cholecystectomy. PANCREAS: Normal. SPLEEN: Normal. ADRENALS/URINARY TRACT: Kidneys are orthotopic, demonstrating symmetric enhancement. Too small to characterize RIGHT interpolar hypodensity. 1 cm LEFT lower pole and RIGHT interpolar cysts. No  nephrolithiasis, hydronephrosis or solid renal masses. The unopacified ureters are normal in course and caliber. Delayed imaging through the kidneys demonstrates symmetric prompt contrast excretion within the proximal urinary collecting system. Urinary bladder is partially distended and unremarkable. Normal adrenal glands. STOMACH/BOWEL: The stomach, small and large bowel are normal in course and caliber without inflammatory changes. Moderate descending and sigmoid diverticulosis. Mild amount of retained large bowel stool. VASCULAR/LYMPHATIC: Aortoiliac vessels are normal in course and caliber, moderate calcific atherosclerosis. No lymphadenopathy by CT size criteria. REPRODUCTIVE: Status post hysterectomy. OTHER: No intraperitoneal free fluid or free air. MUSCULOSKELETAL: Non-acute. Gluteal injection granulomas. Anterior abdominal wall ligamentous laxity. L2 benign hemangioma. Mild lower lumbar facet arthropathy. Anterior pelvic wall scarring. IMPRESSION: CT CHEST:  No CT findings of neoplasm in the chest. Trace pleural effusions and bibasilar atelectasis. Old granulomatous disease. Old multiple mild and mild to moderate age indeterminate upper thoracic compression fractures. CT ABDOMEN AND PELVIS: No CT findings of neoplasm in the abdomen or pelvis. Diverticulosis without acute diverticulitis. Status post cholecystectomy, appendectomy and hysterectomy. Electronically Signed   By: Elon Alas M.D.   On: 05/22/2016 14:17    ASSESSMENT AND PLAN:    Principal Problem:   Seizures ([place]) Active Problems:   Type 2 diabetes mellitus (HCC)   Essential hypertension   OSA (obstructive sleep apnea)   Seizure (HCC)   Abnormal MRI   Polypharmacy   Controlled diabetes mellitus type 2 with complications (HCC)  Pre-op clearance: with chest pain and elevated troponin will plan for nuclear stress test. As long as no high risk features she will be cleared for surgery.   Elevated troponin: unclear etiology    HTN: BP was elevated but actually soft today. ? accuracy  DMT2: continue SSI per IM  Previous CVA: continue statin.   OSA: continue CPAP  Signed: Angelena Form, PA-C 05/23/2016 12:56 PM  Pager [phone]  Co-Sign MD  Patient seen and examined. Agree with assessment and plan.  Ms. Kristi Kramer is a 70 year old Caucasian female who has a history of hypertension, type 2 diabetes mellitus, COPD with prior greater than 30  year history of tobacco use, struck to sleep apnea, and prior CVA 2.  She was admitted following tonic-clonic seizure activity.  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  Of note, remotely , she had an abnormal MRI appearance and several lesions have been present had resolved and were felt possibly due to venous infarcts.  Approximately one month ago, she fell out of a trailer when she was at the beach.  She sustained chest wall injury and has complained of chest wall discomfort ever since.  Her chest pain is clearly sedated by palpation and is not ischemic in etiology.  During her present evaluation, she was found to be hypokalemic and had an elevated lactate at 2.2.  She has had mildly positive troponins with a flat plateau curve against acute coronary syndrome etiology.  She is not very active.  Her ECG on presentation shows sinus rhythm at 71 bpm with nonspecific ST changes.  She also had mild T-wave abnormality in lead 1 and aVL.  She is tentatively scheduled to undergo neurosurgery on Monday.  I have recommended that she undergo a preoperative nuclear pharmacologic stress test for risk stratification prior to undergoing general anesthesia.  We will try to schedule this for tomorrow if schedule permits.  We will also recheck an ECG today since none has been done since 05/19/2016.  I will add low-dose carvedilol to her medical regimen, particularly with her recent hypertension and her diabetes.  She  is diabetic.  Renal function is normal.  She may benefit from ACE inhibition or ARB therapy for blood pressure control.  As long as her nuclear study is not high risk with significant ischemia, she most likely will be given clearance for planned neurosurgery.   Troy Sine, MD, [facility] 05/23/2016 2:13 PM

## 2016-05-23 NOTE — Care Management Note (Signed)
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  PTA, pt independent of ADLS; has significant other and children.                   Action/Plan: Palliative care meeting with pt today to discuss goals of care.  Will follow for discharge planning as pt progresses.  Brain biopsy scheduled for Monday, November 6.    Expected Discharge Date:                  Expected Discharge Plan:  Home/Self Care  In-House Referral:     Discharge planning Services  CM Consult  Post Acute Care Choice:    Choice offered to:     DME Arranged:    DME Agency:     HH Arranged:    HH Agency:     Status of Service:  In process, will continue to follow  If discussed at Long Length of Stay Meetings, dates discussed:    Additional Comments:  Ella Bodo, RN 05/23/2016, 2:47 PM

## 2016-05-23 NOTE — Consult Note (Signed)
Consultation Note Date: 05/23/2016   Patient Name: Kristi Kramer  DOB: 1946-01-31  MRN: [id number]  Age / Sex: 70 y.o., female  PCP: Cyndi Bender, PA-C Referring Physician: Allie Bossier, MD  Reason for Consultation: Establishing goals of care  HPI/Patient Profile: 70 y.o. 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 Patient has had an abnormal MRI going back to January 2017 with follow-up by Dr. Arnoldo Morale. She was started on steroids and imaging improved and in March 2017 initial thoughts were that they were  potentially old infarcts. Further workup in the hospital via CT during this hospitalization, scan does not show any neoplasm in the abdominal abdomen or pelvis, lungs or lymphatic system. She has had no further seizures since admission and starting on Keppra   Clinical Assessment and Goals of Care: Patient is alert and oriented 4. She tells me that she is anxious and worried and overwhelmed. She has shared with her physicians that she wishes for her significant other, and a good friend to be her surrogate decision maker should that be ever warranted versus her children. Currently there are numerous family members in the room including 2 of her children as well as her significant other Ernie.  Patient currently is capable of making her own decisions. She is unmarried but has children. It is her desire to complete healthcare power of attorney and power of attorney documentation in the hospital    SUMMARY OF RECOMMENDATIONS   Patient wishes to have biopsy of brain abnormalities which is scheduled for Monday, 05/26/2016 Consult placed to spiritual care to complete healthcare power of attorney as well as power of attorney documents on  05/23/2016 Patient continues as full code and full scope of treatment for now I'm hopeful to be able to speak to patient alone on 05/24/2016 to further address these issues as well as following up on advance care planning documentation Code Status/Advance Care Planning:  Full code   Palliative Prophylaxis:   Aspiration, Bowel Regimen, Delirium Protocol, Frequent Pain Assessment and Oral Care  Additional Recommendations (Limitations, Scope, Preferences):  Full Scope Treatment  Psycho-social/Spiritual:   Desire for further Chaplaincy support:yes   Prognosis:   Unable to determine  Discharge Planning: To Be Determined      Primary Diagnoses: Present on Admission: . OSA (obstructive sleep apnea) . Essential hypertension   I have reviewed the medical record, interviewed the patient and family, and examined the patient. The following aspects are pertinent.  Past Medical History:  Diagnosis Date  . Anxiety   . Arthritis   . COPD (chronic obstructive pulmonary disease) (HCC)    sarcoidosis  . Diabetes mellitus   . GERD (gastroesophageal reflux disease)   . High cholesterol   . Hypertension   . Shortness of breath dyspnea   . Sleep apnea    cpap  . Stroke ([place])    x2 12,  16   Social History   Social History  . Marital status: Divorced    Spouse name: N/A  . Number of children: N/A  . Years of education: N/A   Occupational History  . Disabled Not Employed   Social History Main Topics  . Smoking status: Light Tobacco Smoker    Packs/day: 0.25    Years: 22.00    Types: Cigarettes  . Smokeless tobacco: Never Used     Comment: wants patches to quit  . Alcohol use No  . Drug use: No  . Sexual activity: No   Other Topics Concern  . None   Social History Narrative  . None   Family History  Problem Relation Age of Onset  . Liver cancer Father    Scheduled Meds: . atorvastatin  20 mg Oral q1800  . carvedilol  3.125 mg Oral BID WC  . citalopram  20  mg Oral Daily  . enoxaparin (LOVENOX) injection  40 mg Subcutaneous Q24H  . fluticasone  1-2 spray Each Nare BID  . fosfomycin  3 g Oral Q48H  . insulin aspart  0-9 Units Subcutaneous TID WC  . levETIRAcetam  500 mg Oral BID  . pantoprazole  40 mg Oral Daily  . topiramate  50 mg Oral BID   Continuous Infusions:  PRN Meds:.acetaminophen, albuterol, ALPRAZolam, hydrALAZINE, ketorolac, LORazepam Medications Prior to Admission:  Prior to Admission medications   Medication Sig Start Date End Date Taking? Authorizing Provider  ADVAIR DISKUS 250-50 MCG/DOSE AEPB Inhale 1 puff into the lungs 2 (two) times daily. 11/27/15  Yes Historical Provider, MD  albuterol (PROVENTIL) (2.5 MG/3ML) 0.083% nebulizer solution Take 2.5 mg by nebulization 3 (three) times daily as needed for wheezing or shortness of breath.   Yes Historical Provider, MD  ALPRAZolam Duanne Moron) 0.5 MG tablet Take 1 tablet (0.5 mg total) by mouth daily as needed. For anxiety. 12/28/15  Yes Lavina Hamman, MD  aspirin EC 81 MG tablet Take 81 mg by mouth daily.    Yes Historical Provider, MD  atorvastatin (LIPITOR) 20 MG tablet Take 20 mg by mouth daily.  06/02/15  Yes Historical Provider, MD  citalopram (CELEXA) 20 MG tablet Take 20 mg by mouth daily.   Yes Historical Provider, MD  esomeprazole (NEXIUM) 20 MG capsule Take 40 mg by mouth daily at 12 noon.    Yes Historical Provider, MD  fluticasone (FLONASE) 50 MCG/ACT nasal spray Place 1-2 sprays into both nostrils 2 (two) times daily.   Yes Historical Provider, MD  hydrochlorothiazide (HYDRODIURIL) 25 MG tablet Take 25 mg by mouth daily.   Yes Historical Provider, MD  lidocaine (LIDODERM) 5 % Place 1 patch onto the skin daily as needed (pain). Remove & Discard patch within 12 hours or as directed by MD   Yes Historical Provider, MD  meloxicam (MOBIC) 7.5 MG tablet Take 7.5 mg by mouth daily.   Yes Historical Provider, MD  metFORMIN (GLUCOPHAGE) 1000 MG tablet Take 1,000 mg by mouth 2 (two)  times daily with a meal.  06/02/15  Yes Historical Provider, MD  phenazopyridine (PYRIDIUM) 100 MG tablet Take 1 tablet (100 mg total) by mouth 3 (three) times daily as needed for pain. 12/28/15  Yes Lavina Hamman, MD  promethazine (PHENERGAN) 12.5 MG tablet Take 12.5 mg by mouth every 6 (six) hours as needed for nausea or vomiting.   Yes Historical Provider, MD  topiramate (TOPAMAX) 50 MG tablet Take 1 tablet (50 mg total) by mouth daily. Patient  taking differently: Take 50 mg by mouth daily as needed (headaches).  03/11/16  Yes Adam Telford Nab, DO  traMADol (ULTRAM) 50 MG tablet Take 50 mg by mouth every 6 (six) hours as needed for moderate pain.   Yes Historical Provider, MD  VENTOLIN HFA 108 (90 BASE) MCG/ACT inhaler Inhale 2 puffs into the lungs every 6 (six) hours as needed. wheezing 04/10/15  Yes Historical Provider, MD  Vitamin D, Ergocalciferol, (DRISDOL) 50000 units CAPS capsule Take 50,000 Units by mouth every 7 (seven) days.   Yes Historical Provider, MD   Allergies  Allergen Reactions  . Mometasone Anaphylaxis and Rash    Turns real red Turns Red  . Codeine Nausea And Vomiting  . Sulfa Antibiotics Nausea And Vomiting   Review of Systems  Constitutional: Positive for activity change.  HENT: Negative.   Eyes: Negative.   Respiratory: Positive for shortness of breath.   Cardiovascular: Positive for chest pain.  Gastrointestinal: Negative.   Endocrine: Negative.   Genitourinary: Positive for dysuria.  Musculoskeletal: Negative.   Skin: Negative.   Allergic/Immunologic: Negative.   Neurological: Positive for seizures.  Hematological: Negative.   Psychiatric/Behavioral: Positive for confusion, decreased concentration and dysphoric mood. The patient is nervous/anxious.     Physical Exam  Constitutional: She is oriented to person, place, and time. She appears well-developed and well-nourished.  HENT:  Head: Normocephalic and atraumatic.  Neck: Normal range of motion.    Cardiovascular: Normal rate.   Pulmonary/Chest: Effort normal.  Abdominal: Soft.  Musculoskeletal: Normal range of motion.  Neurological: She is alert and oriented to person, place, and time.  Skin: Skin is warm and dry.  Psychiatric: Her behavior is normal. Judgment and thought content normal.  Affect blunted  Nursing note and vitals reviewed.   Vital Signs: BP 93/62 (BP Location: Right Arm)   Pulse 66   Temp 98.5 F (36.9 C) (Oral)   Resp (!) 22   Ht '5\' 5"'$  (1.651 m)   Wt 94.3 kg (208 lb)   SpO2 100%   BMI 34.61 kg/m  Pain Assessment: No/denies pain POSS *See Group Information*: S-Acceptable,Sleep, easy to arouse Pain Score: 0-No pain   SpO2: SpO2: 100 % O2 Device:SpO2: 100 % O2 Flow Rate: .O2 Flow Rate (L/min): 2 L/min  IO: Intake/output summary:  Intake/Output Summary (Last 24 hours) at 05/23/16 1350 Last data filed at 05/23/16 0656  Gross per 24 hour  Intake              600 ml  Output             1000 ml  Net             -400 ml    LBM: Last BM Date: 05/21/16 Baseline Weight: Weight: 94.3 kg (208 lb) Most recent weight: Weight: 94.3 kg (208 lb)     Palliative Assessment/Data:   Flowsheet Rows   Flowsheet Row Most Recent Value  Intake Tab  Referral Department  Hospitalist  Unit at Time of Referral  Med/Surg Unit  Palliative Care Primary Diagnosis  Cancer  Date Notified  05/22/16  Palliative Care Type  New Palliative care  Reason for referral  Clarify Goals of Care  Date of Admission  05/18/16  Date first seen by Palliative Care  05/23/16  # of days Palliative referral response time  1 Day(s)  # of days IP prior to Palliative referral  4  Clinical Assessment  Palliative Performance Scale Score  50%  Pain Max last 24  hours  3  Pain Min Last 24 hours  3  Dyspnea Max Last 24 Hours  3  Dyspnea Min Last 24 hours  0  Nausea Max Last 24 Hours  0  Nausea Min Last 24 Hours  0  Anxiety Max Last 24 Hours  4  Anxiety Min Last 24 Hours  0  Psychosocial &  Spiritual Assessment  Palliative Care Outcomes  Patient/Family meeting held?  No  Palliative Care Outcomes  ACP counseling assistance  Palliative Care follow-up planned  Yes, Facility      Time In: 1100 Time Out: 1200 Time Total: 60 min Greater than 50%  of this time was spent counseling and coordinating care related to the above assessment and plan.Staffed with Dr. Sherral Hammers  Signed by: Dory Horn, NP   Please contact Palliative Medicine Team phone at [phone] for questions and concerns.  For individual provider: See Shea Evans

## 2016-05-23 NOTE — Progress Notes (Signed)
Patient ID: Kristi Kramer, female   DOB: 06/25/1946, 70 y.o.   MRN: [id number] Subjective:  The patient is alert and pleasant. She is in no apparent distress. She has no complaints. She's had no more seizures. She wants to go home.  Objective: Vital signs in last 24 hours: Temp:  [97.9 F (36.6 C)-99.2 F (37.3 C)] 97.9 F (36.6 C) (11/03 0742) Pulse Rate:  [58-98] 70 (11/03 0742) Resp:  [16-22] 20 (11/03 0742) BP: (150-179)/(81-118) 150/118 (11/03 0742) SpO2:  [96 %-100 %] 100 % (11/03 0742)  Intake/Output from previous day: 11/02 0701 - 11/03 0700 In: 1080 [P.O.:1080] Out: 1200 [Urine:1200] Intake/Output this shift: No intake/output data recorded.  Physical exam the patient is alert and oriented. Her speech is normal. She is moving all 4 extremities well.  Lab Results:  Recent Labs  05/22/16 0255  WBC 7.8  HGB 12.5  HCT 36.2  PLT 240   BMET  Recent Labs  05/22/16 0255  NA 136  K 2.8*  CL 102  CO2 25  GLUCOSE 132*  BUN 8  CREATININE 0.86  CALCIUM 9.3    Studies/Results: Ct Chest W Contrast  Result Date: 05/22/2016 CLINICAL DATA:  History of waxing and waning abnormal intracranial enhancement, suspect brain tumor, possible metastasis. History of hypertension, diabetes, stroke. History of cholecystectomy, appendectomy and hysterectomy. EXAM: CT CHEST, ABDOMEN, AND PELVIS WITH CONTRAST TECHNIQUE: Multidetector CT imaging of the chest, abdomen and pelvis was performed following the standard protocol during bolus administration of intravenous contrast. CONTRAST:  100 cc ISOVUE-300 IOPAMIDOL (ISOVUE-300) INJECTION 61% COMPARISON:  MRI head May 21, 2016 and multiple priors. CT abdomen and pelvis May 29, 2015. FINDINGS: CT CHEST FINDINGS CARDIOVASCULAR: Heart size is normal. Mild coronary artery calcifications. Mild pericardial wall thickening. Thoracic aorta is normal course and caliber, mild calcific atherosclerosis. MEDIASTINUM/NODES: No mediastinal mass. No  lymphadenopathy by CT size criteria. Small calcified LEFT hilar lymph nodes. Normal appearance of thoracic esophagus though not tailored for evaluation. Increasingly prominent chylous duct at the crus of the diaphragm. LUNGS/PLEURA: Tracheobronchial tree is patent, no pneumothorax. Scattered calcified granulomas. Enhancing dependent atelectasis. Trace pleural effusions. No focal consolidation, nodules or masses. MUSCULOSKELETAL: Mild T3, mild to moderate T4 through T6 and mild T7 age indeterminate compression fractures. No destructive bony lesions. ACDF. Mildly widened RIGHT acromioclavicular joint space most compatible with old injury. CT ABDOMEN AND PELVIS FINDINGS HEPATOBILIARY: Liver is normal.  Status post cholecystectomy. PANCREAS: Normal. SPLEEN: Normal. ADRENALS/URINARY TRACT: Kidneys are orthotopic, demonstrating symmetric enhancement. Too small to characterize RIGHT interpolar hypodensity. 1 cm LEFT lower pole and RIGHT interpolar cysts. No nephrolithiasis, hydronephrosis or solid renal masses. The unopacified ureters are normal in course and caliber. Delayed imaging through the kidneys demonstrates symmetric prompt contrast excretion within the proximal urinary collecting system. Urinary bladder is partially distended and unremarkable. Normal adrenal glands. STOMACH/BOWEL: The stomach, small and large bowel are normal in course and caliber without inflammatory changes. Moderate descending and sigmoid diverticulosis. Mild amount of retained large bowel stool. VASCULAR/LYMPHATIC: Aortoiliac vessels are normal in course and caliber, moderate calcific atherosclerosis. No lymphadenopathy by CT size criteria. REPRODUCTIVE: Status post hysterectomy. OTHER: No intraperitoneal free fluid or free air. MUSCULOSKELETAL: Non-acute. Gluteal injection granulomas. Anterior abdominal wall ligamentous laxity. L2 benign hemangioma. Mild lower lumbar facet arthropathy. Anterior pelvic wall scarring. IMPRESSION: CT CHEST:  No  CT findings of neoplasm in the chest. Trace pleural effusions and bibasilar atelectasis. Old granulomatous disease. Old multiple mild and mild to moderate age indeterminate upper thoracic  compression fractures. CT ABDOMEN AND PELVIS: No CT findings of neoplasm in the abdomen or pelvis. Diverticulosis without acute diverticulitis. Status post cholecystectomy, appendectomy and hysterectomy. Electronically Signed   By: Elon Alas M.D.   On: 05/22/2016 14:17   Mr Jeri Cos RJ Contrast  Result Date: 05/21/2016 CLINICAL DATA:  70 year old female undergoing brain MRI with general anesthesia. Admitted with seizure and confusion. Abnormal signal in the brain, progressive since March 2016. Initial encounter. EXAM: MRI HEAD WITHOUT AND WITH CONTRAST TECHNIQUE: Multiplanar, multiecho pulse sequences of the brain and surrounding structures were obtained without and with intravenous contrast. CONTRAST:  78m MULTIHANCE GADOBENATE DIMEGLUMINE 529 MG/ML IV SOLN COMPARISON:  Recent brain MRI without contrast 05/20/2016. Brain MRIs with contrast 09/24/2015 and earlier. FINDINGS: Brain: No restricted diffusion or evidence of acute infarction. Confluent abnormal T2 and FLAIR hyperintensity involving the anterior inferior frontal gyri and the genu of the corpus callosum which is abnormally expanded, a new finding since March 2017. Associated scattered and confluent confluent nodular, as well as petechial enhancement throughout the inferior frontal gyri, and tracking into the right hypothalamus. Associated mild regional mass effect. Diffusion is facilitated in these areas. However, some of the nodular enhancing component in the left inferior frontal gyrus is dark on T2 signal (series 11, image 22). Superimposed large 3 cm area of abnormal enhancement in the posterior right cerebellum with confluent surrounding T2 and FLAIR hyperintensity. This enhancement appears both parenchymal and possibly leptomeningeal. Similar mild  regional mass effect and facilitated diffusion in that region. In January 2017 there was confluent abnormal signal in the left superior parietal lobe which resembled vasogenic edema and was associated with abnormal nodular gyral enhancement at that time which is similar to the inferior frontal gyral enhancement seen today. However, that edema has resolved and there is now a small area of cortical encephalomalacia corresponding to the gyral enhancement at that time. In 2016 abnormal signal in the right peri-Rolandic cortex at the superior frontal gyrus was noted and although the confluent T2 and FLAIR hyperintensity there persists, a small area of cortical encephalomalacia also developed. No pachymeningeal thickening. No abnormal dural thickening or enhancement elsewhere. No acute intracranial hemorrhage identified. No midline shift. No ventriculomegaly. Basilar cisterns remain patent. Negative pituitary. Negative cervicomedullary junction. Vascular: Major intracranial vascular flow voids are stable since 2016 and appear normal. Skull and upper cervical spine: Negative aside from previous postoperative changes. Visible cervical spinal cord is normal. Sinuses/Orbits: Stable mild mastoid effusions. Fluid in the pharynx in the setting of intubation. Mild left maxillary sinus mucous retention cyst. Stable and negative orbit soft tissues. Other: Visible internal auditory structures appear normal. Negative scalp soft tissues. IMPRESSION: 1. Moderate to severe abnormal enhancement and signal abnormality within infiltrative appearance in both inferior frontal gyri, the genu of the corpus callosum, and separately in the right cerebellum. There is nonenhancing T2 signal abnormality extending into the right hypothalamus. Mild associated regional mass effect. Diffusion in these areas is facilitated, although some of the enhancing components are dark on T2 signal. 2. Evolution and/or regression of signal abnormality in the left  parietal and superior right frontal lobes since January 2017 and March 2016, respectively. Small foci of cortical encephalomalacia developed at both areas. But while the left parietal FLAIR signal abnormality nearly resolved, that in the right frontal lobe did not. 3. 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 Other top differential diagnosis are Atypical CNS Infection and Neurosarcoidosis. Tumefactive demyelination is felt less likely. Glioblastoma  or glioma ptosis is felt unlikely since the left parietal involvement largely resolved. Electronically Signed   By: Genevie Ann M.D.   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 History of hypertension, diabetes, stroke. History of cholecystectomy, appendectomy and hysterectomy. EXAM: CT CHEST, ABDOMEN, AND PELVIS WITH CONTRAST TECHNIQUE: Multidetector CT imaging of the chest, abdomen and pelvis was performed following the standard protocol during bolus administration of intravenous contrast. CONTRAST:  100 cc ISOVUE-300 IOPAMIDOL (ISOVUE-300) INJECTION 61% COMPARISON:  MRI head May 21, 2016 and multiple priors. CT abdomen and pelvis May 29, 2015. FINDINGS: CT CHEST FINDINGS CARDIOVASCULAR: Heart size is normal. Mild coronary artery calcifications. Mild pericardial wall thickening. Thoracic aorta is normal course and caliber, mild calcific atherosclerosis. MEDIASTINUM/NODES: No mediastinal mass. No lymphadenopathy by CT size criteria. Small calcified LEFT hilar lymph nodes. Normal appearance of thoracic esophagus though not tailored for evaluation. Increasingly prominent chylous duct at the crus of the diaphragm. LUNGS/PLEURA: Tracheobronchial tree is patent, no pneumothorax. Scattered calcified granulomas. Enhancing dependent atelectasis. Trace pleural effusions. No focal  consolidation, nodules or masses. MUSCULOSKELETAL: Mild T3, mild to moderate T4 through T6 and mild T7 age indeterminate compression fractures. No destructive bony lesions. ACDF. Mildly widened RIGHT acromioclavicular joint space most compatible with old injury. CT ABDOMEN AND PELVIS FINDINGS HEPATOBILIARY: Liver is normal.  Status post cholecystectomy. PANCREAS: Normal. SPLEEN: Normal. ADRENALS/URINARY TRACT: Kidneys are orthotopic, demonstrating symmetric enhancement. Too small to characterize RIGHT interpolar hypodensity. 1 cm LEFT lower pole and RIGHT interpolar cysts. No nephrolithiasis, hydronephrosis or solid renal masses. The unopacified ureters are normal in course and caliber. Delayed imaging through the kidneys demonstrates symmetric prompt contrast excretion within the proximal urinary collecting system. Urinary bladder is partially distended and unremarkable. Normal adrenal glands. STOMACH/BOWEL: The stomach, small and large bowel are normal in course and caliber without inflammatory changes. Moderate descending and sigmoid diverticulosis. Mild amount of retained large bowel stool. VASCULAR/LYMPHATIC: Aortoiliac vessels are normal in course and caliber, moderate calcific atherosclerosis. No lymphadenopathy by CT size criteria. REPRODUCTIVE: Status post hysterectomy. OTHER: No intraperitoneal free fluid or free air. MUSCULOSKELETAL: Non-acute. Gluteal injection granulomas. Anterior abdominal wall ligamentous laxity. L2 benign hemangioma. Mild lower lumbar facet arthropathy. Anterior pelvic wall scarring. IMPRESSION: CT CHEST:  No CT findings of neoplasm in the chest. Trace pleural effusions and bibasilar atelectasis. Old granulomatous disease. Old multiple mild and mild to moderate age indeterminate upper thoracic compression fractures. CT ABDOMEN AND PELVIS: No CT findings of neoplasm in the abdomen or pelvis. Diverticulosis without acute diverticulitis. Status post cholecystectomy, appendectomy and  hysterectomy. Electronically Signed   By: Elon Alas M.D.   On: 05/22/2016 14:17    Assessment/Plan: Brain lesions: The patient is doing well. Her surgery is scheduled for Monday. I have answered all her questions regarding the surgery. She wants to go home and come back on Monday. That is fine with me. If she is discharged she should go home on anticonvulsants, I will start [place]. I would not start steroids. If she is discharged please have her call my office for instructions regarding returning for surgery on Monday.  LOS: 4 days     Aki Burdin D 05/23/2016, 7:58 AM

## 2016-05-23 NOTE — Discharge Summary (Signed)
Physician Discharge Summary  Kristi Kramer XBM:[id number] DOB: [birth year] DOA: 05/18/2016  PCP: Cyndi Bender, PA-C  Admit date: 05/18/2016 Discharge date: 05/28/2016  Time spent: 35 minutes  Recommendations for Outpatient Follow-up:  Seizures -EEG: Abnormal could be consistent with structural abnormality see results below  Brain metastases/Primary Brain Lymphoma -Received call from [facility]: Believe they may see metastases/masses on normal brain MRI. Have ordered contrast MRI. 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 Newman Pies Neurosurgery has seen previously March 2017 -Obtain CT chest, abdomen, pelvis to complete neoplasm workup. Will await findings to determine best site for biopsy -Per Dr. Newman Pies Neurosurgery patient instructed to follow-up with me in one week for staple removal and discussion of her final diagnosis. Arrangements will be made for outpatient oncological consultation. All her questions were answered  Stroke (history of) -CT scan showed old strokes see results below  OSA -CPAP   COPD -Currently asymptomatic monitor closely  HTN -Controlled  Positive ESBL  UTI -completed 3 days of abx    Diabetes type 2 controlled with, complications -Per surgical discharge  Hypokalemia - To be monitored by PCP and surgery.     Discharge Diagnoses:  Principal Problem:   Seizures ([place]) Active Problems:   Type 2 diabetes mellitus (HCC)   Essential hypertension   OSA (obstructive sleep apnea)   Seizure (HCC)   Abnormal MRI   Polypharmacy   Controlled diabetes mellitus type 2 with complications (HCC)   Palliative care encounter   Elevated troponin   Preoperative clearance   ESBL (extended spectrum beta-lactamase) producing bacteria infection   Brain tumor [facility])   Discharge Condition: Stable  Diet recommendation: American [employer]  [employer]  Filed Weights   05/19/16 0958  Weight: 94.3 kg (208 lb)    History of present illness:  Kristi Kramer a 70 y.o.WF PMHx  Anxiety, CVA with left-sided weakness, HTN, DM Type 2 uncontrolled with complications, COPD, OSA,   Brought to the ER after patient had a witnessed seizure. Patient's boyfriend saw the patient had tonic-clonic seizure. EMS was called and after EMS arrival patient had another tonic-clonic seizure and was given Midazolam.Patient was postictal in the ER on arrival. CT head is unremarkable. Neurologist was consulted and patient was placed on loading dose of Keppra. After neurological evaluation patient was placed on increased dose of Topamax which patient usually takes for headaches. On exam patient is still post ictal but follows commands and moves all extremities. Pupils are reacting. No further neurological exam possible since patient is still postictal. During this hospitalization patient was found to have altered mental status and findings on brain MRI consistent with lymphoma. Dr. Newman Pies Neurosurgery performed brain biopsy in order to resect right cerebellar tumor. Patient discharged home with instructions to follow-up in one week with neurosurgery to discuss results.   Consultants:  Dr.Karen Jacquenette [facility] Dr. Newman Pies Neurosurgery pending    Procedures/Significant Events:  10/30 CT head WO contrast:-No acute intracranial pathology. - Small chronic infarcts at the high parietal lobes bilaterally. EEG - 10/30 - focal slowing over L hemisphere - frequent broad sharp waves over L frontopolar region  10/31 brain MRI:-New cerebral signal abnormality and swelling centered on the genu of the corpus callosum. Edematous change also present in the right cerebellum. DDX: Tumefactive demyelination, CNS lymphoma, or atypical infection are primary considerations. 11/2 CT chest, abdomen, pelvis W/contrast:-Negative findings  neoplasm.-Positive diverticulosis W/O acute diverticulitis 11/6 Right  suboccipital craniectomy for resection of right cerebellar tumor 11/7 repeat Kristi Kramer cerebral dysfunction that is non-specific in etiology and can be seen with hypoxic/ischemic injury, toxic/metabolic encephalopathies, post-ictal change, or medication effect     Cultures 10/30 MRSA by PCR negative 10/31 urine  pending  Antimicrobials: Rocephin 10/29 > 11/1 Fosfomycin 11/3>>     Discharge Exam: Vitals:   05/27/16 2222 05/28/16 0120 05/28/16 0500 05/28/16 1021  BP: (!) 135/58 (!) 150/64 (!) 143/62 (!) 178/78  Pulse: 62 65 63 90  Resp: '18 20 20 18  '$ Temp: 98.4 F (36.9 C) 97.9 F (36.6 C) 97.8 F (36.6 C) 97.6 F (36.4 C)  TempSrc: Oral Oral Oral Axillary  SpO2: 97% 98% 98% 98%  Weight:      Height:        General: alert sitting in bed confused,, No acute respiratory distress Eyes: negative scleral hemorrhage, negative anisocoria, negative icterus ENT: Negative Runny nose, negative gingival bleeding, Neck:  Posterior neck multiple staples in place negative sign of infection Lungs: Clear to auscultation bilaterally without wheezes or crackles Cardiovascular: Regular rate and rhythm without murmur gallop or rub normal S1 and S2, left lateral thoracic tenderness to palpation (consistent with patient's report of fall)   Discharge Instructions  Discharge Instructions    Ambulatory referral to Neurology    Complete by:  As directed    An appointment is requested in approximately: 2 weeks   Call MD for:  difficulty breathing, headache or visual disturbances    Complete by:  As directed    Call MD for:  extreme fatigue    Complete by:  As directed    Call MD for:  hives    Complete by:  As directed    Call MD for:  persistant dizziness or light-headedness    Complete by:  As directed    Call MD for:  persistant nausea and vomiting    Complete by:  As directed    Call MD for:  redness, tenderness,  or signs of infection (pain, swelling, redness, odor or green/yellow discharge around incision site)    Complete by:  As directed    Call MD for:  severe uncontrolled pain    Complete by:  As directed    Call MD for:  temperature >100.4    Complete by:  As directed    Diet - low sodium heart healthy    Complete by:  As directed    Discharge instructions    Complete by:  As directed    Call [phone] for a followup appointment. Take a stool softener while you are using pain medications.   Driving Restrictions    Complete by:  As directed    Do not drive .   Increase activity slowly    Complete by:  As directed    Lifting restrictions    Complete by:  As directed    Do not lift more than 5 pounds. No excessive bending or twisting.   May shower / Bathe    Complete by:  As directed    He may shower after the pain she is removed 3 days after surgery. Leave the incision alone.   No dressing needed    Complete by:  As directed        Medication List    STOP taking these medications   ALPRAZolam 0.5 MG tablet Commonly known as:  XANAX   promethazine 12.5 MG tablet Commonly known as:  PHENERGAN   traMADol 50 MG  tablet Commonly known as:  ULTRAM     TAKE these medications   ADVAIR DISKUS 250-50 MCG/DOSE Aepb Generic drug:  Fluticasone-Salmeterol Inhale 1 puff into the lungs 2 (two) times daily.   albuterol (2.5 MG/3ML) 0.083% nebulizer solution Commonly known as:  PROVENTIL Take 2.5 mg by nebulization 3 (three) times daily as needed for wheezing or shortness of breath.   VENTOLIN HFA 108 (90 Base) MCG/ACT inhaler Generic drug:  albuterol Inhale 2 puffs into the lungs every 6 (six) hours as needed. wheezing   aspirin EC 81 MG tablet Take 81 mg by mouth daily.   atorvastatin 20 MG tablet Commonly known as:  LIPITOR Take 20 mg by mouth daily.   citalopram 20 MG tablet Commonly known as:  CELEXA Take 20 mg by mouth daily.   dexamethasone 2 MG tablet Commonly  known as:  DECADRON Take 1 tablet (2 mg total) by mouth every 12 (twelve) hours.   esomeprazole 20 MG capsule Commonly known as:  NEXIUM Take 40 mg by mouth daily at 12 noon.   fluticasone 50 MCG/ACT nasal spray Commonly known as:  FLONASE Place 1-2 sprays into both nostrils 2 (two) times daily.   hydrochlorothiazide 25 MG tablet Commonly known as:  HYDRODIURIL Take 25 mg by mouth daily.   HYDROcodone-acetaminophen 5-325 MG tablet Commonly known as:  NORCO/VICODIN Take 1-2 tablets by mouth every 4 (four) hours as needed for moderate pain.   levETIRAcetam 500 MG tablet Commonly known as:  KEPPRA Take 1 tablet (500 mg total) by mouth 2 (two) times daily.   lidocaine 5 % Commonly known as:  LIDODERM Place 1 patch onto the skin daily as needed (pain). Remove & Discard patch within 12 hours or as directed by MD   meloxicam 7.5 MG tablet Commonly known as:  MOBIC Take 7.5 mg by mouth daily.   metFORMIN 1000 MG tablet Commonly known as:  GLUCOPHAGE Take 1,000 mg by mouth 2 (two) times daily with a meal.   phenazopyridine 100 MG tablet Commonly known as:  PYRIDIUM Take 1 tablet (100 mg total) by mouth 3 (three) times daily as needed for pain.   topiramate 50 MG tablet Commonly known as:  TOPAMAX Take 1 tablet (50 mg total) by mouth daily. What changed:  when to take this  reasons to take this   Vitamin D (Ergocalciferol) 50000 units Caps capsule Commonly known as:  DRISDOL Take 50,000 Units by mouth every 7 (seven) days.      Allergies  Allergen Reactions  . Mometasone Anaphylaxis and Rash  . Codeine Nausea And Vomiting  . Sulfa Antibiotics Nausea And Vomiting   Follow-up Information    Go to Dr. Frederich Cha.   Why:   Arrived at [facility] on May 26, 2016 @ 8:15am. Report to preadmissions. Nothing to eat or drink by mouth after midnight and the night before.  Contact information: [place] [address] [phone]             The results of significant diagnostics from this hospitalization (including imaging, microbiology, ancillary and laboratory) are listed below for reference.    Significant Diagnostic Studies: Ct Head Wo Contrast  Result Date: 05/19/2016 CLINICAL DATA:  Multiple seizures, with lethargy, acute onset. Initial encounter. EXAM: CT HEAD WITHOUT CONTRAST TECHNIQUE: Contiguous axial images were obtained from the base of the skull through the vertex without intravenous contrast. COMPARISON:  CT of the head performed 12/26/2015, and MRI of the brain performed 09/24/2015  FINDINGS: Brain: No evidence of acute infarction, hemorrhage, hydrocephalus, extra-axial collection or mass lesion/mass effect. Small chronic infarcts are noted at the high parietal lobes bilaterally. Mild periventricular white matter change likely reflects small vessel ischemic microangiopathy. Mild chronic ischemic change is noted at the right basal ganglia. The brainstem and fourth ventricle are within normal limits. The cerebral hemispheres demonstrate grossly normal gray-white differentiation. No mass effect or midline shift is seen. Vascular: No hyperdense vessel or unexpected calcification. Skull: There is no evidence of fracture; visualized osseous structures are unremarkable in appearance. Sinuses/Orbits: The orbits are within normal limits. The patient is status post left-sided mastoidectomy. There is partial opacification of the right mastoid air cells. The paranasal sinuses are well-aerated. Other: No significant soft tissue abnormalities are seen. IMPRESSION: 1. No acute intracranial pathology seen on CT. 2. Small chronic infarcts at the high parietal lobes bilaterally. 3. Mild small vessel ischemic microangiopathy. Mild chronic ischemic change at the right basal ganglia. 4. Partial opacification of the right mastoid air cells. Electronically Signed   By: Garald Balding M.D.   On: 05/19/2016 01:38   Ct Chest W  Contrast  Result Date: 05/22/2016 CLINICAL DATA:  History of waxing and waning abnormal intracranial enhancement, suspect brain tumor, possible metastasis. History of hypertension, diabetes, stroke. History of cholecystectomy, appendectomy and hysterectomy. EXAM: CT CHEST, ABDOMEN, AND PELVIS WITH CONTRAST TECHNIQUE: Multidetector CT imaging of the chest, abdomen and pelvis was performed following the standard protocol during bolus administration of intravenous contrast. CONTRAST:  100 cc ISOVUE-300 IOPAMIDOL (ISOVUE-300) INJECTION 61% COMPARISON:  MRI head May 21, 2016 and multiple priors. CT abdomen and pelvis May 29, 2015. FINDINGS: CT CHEST FINDINGS CARDIOVASCULAR: Heart size is normal. Mild coronary artery calcifications. Mild pericardial wall thickening. Thoracic aorta is normal course and caliber, mild calcific atherosclerosis. MEDIASTINUM/NODES: No mediastinal mass. No lymphadenopathy by CT size criteria. Small calcified LEFT hilar lymph nodes. Normal appearance of thoracic esophagus though not tailored for evaluation. Increasingly prominent chylous duct at the crus of the diaphragm. LUNGS/PLEURA: Tracheobronchial tree is patent, no pneumothorax. Scattered calcified granulomas. Enhancing dependent atelectasis. Trace pleural effusions. No focal consolidation, nodules or masses. MUSCULOSKELETAL: Mild T3, mild to moderate T4 through T6 and mild T7 age indeterminate compression fractures. No destructive bony lesions. ACDF. Mildly widened RIGHT acromioclavicular joint space most compatible with old injury. CT ABDOMEN AND PELVIS FINDINGS HEPATOBILIARY: Liver is normal.  Status post cholecystectomy. PANCREAS: Normal. SPLEEN: Normal. ADRENALS/URINARY TRACT: Kidneys are orthotopic, demonstrating symmetric enhancement. Too small to characterize RIGHT interpolar hypodensity. 1 cm LEFT lower pole and RIGHT interpolar cysts. No nephrolithiasis, hydronephrosis or solid renal masses. The unopacified ureters are  normal in course and caliber. Delayed imaging through the kidneys demonstrates symmetric prompt contrast excretion within the proximal urinary collecting system. Urinary bladder is partially distended and unremarkable. Normal adrenal glands. STOMACH/BOWEL: The stomach, small and large bowel are normal in course and caliber without inflammatory changes. Moderate descending and sigmoid diverticulosis. Mild amount of retained large bowel stool. VASCULAR/LYMPHATIC: Aortoiliac vessels are normal in course and caliber, moderate calcific atherosclerosis. No lymphadenopathy by CT size criteria. REPRODUCTIVE: Status post hysterectomy. OTHER: No intraperitoneal free fluid or free air. MUSCULOSKELETAL: Non-acute. Gluteal injection granulomas. Anterior abdominal wall ligamentous laxity. L2 benign hemangioma. Mild lower lumbar facet arthropathy. Anterior pelvic wall scarring. IMPRESSION: CT CHEST:  No CT findings of neoplasm in the chest. Trace pleural effusions and bibasilar atelectasis. Old granulomatous disease. Old multiple mild and mild to moderate age indeterminate upper thoracic compression fractures. CT ABDOMEN AND  PELVIS: No CT findings of neoplasm in the abdomen or pelvis. Diverticulosis without acute diverticulitis. Status post cholecystectomy, appendectomy and hysterectomy. Electronically Signed   By: Elon Alas M.D.   On: 05/22/2016 14:17   Mr Brain Wo Contrast  Result Date: 05/20/2016 CLINICAL DATA:  Seizure.  History of CVA with left-sided weakness. EXAM: MRI HEAD WITHOUT CONTRAST TECHNIQUE: Multiplanar, multiecho pulse sequences of the brain and surrounding structures were obtained without intravenous contrast. COMPARISON:  09/24/2015 and earlier FINDINGS: Brain: There is new T2 and FLAIR hyperintensity centered on the genu of the corpus callosum and extending into the inferior frontal white matter/cortex, with swelling. These changes also continue along the hypothalamus and in the caudate heads.  Minimal if any diffusion restriction. No evidence of blood products. There is edematous change also seen in the peripheral right cerebellum. Stable gliosis in the left parietal and parasagittal right frontal lobes. In the left parietal region 08/10/2015 there was edema and enhancement which subsided. Inflammation or venous infarct were the primary considerations at that time. Current findings are presumably related to the same process. The dural venous sinuses have normal flow voids and the current cerebral edematous changes are not typical for veno-occlusive disease. A inflammatory/ demyelinating process is favored. CNS lymphoma is considered given the periventricular predominant findings in the cerebral hemispheres, but the cerebellar finding would not be typical. Cerebritis is also considered. Vascular: Normal flow voids Skull and upper cervical spine:  Negative. Sinuses/Orbits:Left mastoidectomy.  Chronic right mastoiditis. Other: These results were called by telephone at the time of interpretation on 05/20/2016 at 10:12 am to Dr. Gari Crown , who verbally acknowledged these results. IMPRESSION: New cerebral signal abnormality and swelling centered on the genu of the corpus callosum. Edematous change also present in the right cerebellum. Underlying pathology is presumably the same process as seen in the left parietal lobe 08/10/2015. Tumefactive demyelination, CNS lymphoma, or atypical infection are primary considerations. Veno-occlusive disease was implicated previously; dural venous sinuses have an unremarkable noncontrast appearance. Enhanced MRI is planned. Electronically Signed   By: Monte Fantasia M.D.   On: 05/20/2016 10:32   Mr Jeri Cos UX Contrast  Result Date: 05/21/2016 CLINICAL DATA:  70 year old female undergoing brain MRI with general anesthesia. Admitted with seizure and confusion. Abnormal signal in the brain, progressive since March 2016. Initial encounter. EXAM: MRI HEAD WITHOUT AND WITH  CONTRAST TECHNIQUE: Multiplanar, multiecho pulse sequences of the brain and surrounding structures were obtained without and with intravenous contrast. CONTRAST:  61m MULTIHANCE GADOBENATE DIMEGLUMINE 529 MG/ML IV SOLN COMPARISON:  Recent brain MRI without contrast 05/20/2016. Brain MRIs with contrast 09/24/2015 and earlier. FINDINGS: Brain: No restricted diffusion or evidence of acute infarction. Confluent abnormal T2 and FLAIR hyperintensity involving the anterior inferior frontal gyri and the genu of the corpus callosum which is abnormally expanded, a new finding since March 2017. Associated scattered and confluent confluent nodular, as well as petechial enhancement throughout the inferior frontal gyri, and tracking into the right hypothalamus. Associated mild regional mass effect. Diffusion is facilitated in these areas. However, some of the nodular enhancing component in the left inferior frontal gyrus is dark on T2 signal (series 11, image 22). Superimposed large 3 cm area of abnormal enhancement in the posterior right cerebellum with confluent surrounding T2 and FLAIR hyperintensity. This enhancement appears both parenchymal and possibly leptomeningeal. Similar mild regional mass effect and facilitated diffusion in that region. In January 2017 there was confluent abnormal signal in the left superior parietal lobe which resembled vasogenic edema  and was associated with abnormal nodular gyral enhancement at that time which is similar to the inferior frontal gyral enhancement seen today. However, that edema has resolved and there is now a small area of cortical encephalomalacia corresponding to the gyral enhancement at that time. In 2016 abnormal signal in the right peri-Rolandic cortex at the superior frontal gyrus was noted and although the confluent T2 and FLAIR hyperintensity there persists, a small area of cortical encephalomalacia also developed. No pachymeningeal thickening. No abnormal dural thickening  or enhancement elsewhere. No acute intracranial hemorrhage identified. No midline shift. No ventriculomegaly. Basilar cisterns remain patent. Negative pituitary. Negative cervicomedullary junction. Vascular: Major intracranial vascular flow voids are stable since 2016 and appear normal. Skull and upper cervical spine: Negative aside from previous postoperative changes. Visible cervical spinal cord is normal. Sinuses/Orbits: Stable mild mastoid effusions. Fluid in the pharynx in the setting of intubation. Mild left maxillary sinus mucous retention cyst. Stable and negative orbit soft tissues. Other: Visible internal auditory structures appear normal. Negative scalp soft tissues. IMPRESSION: 1. Moderate to severe abnormal enhancement and signal abnormality within infiltrative appearance in both inferior frontal gyri, the genu of the corpus callosum, and separately in the right cerebellum. There is nonenhancing T2 signal abnormality extending into the right hypothalamus. Mild associated regional mass effect. Diffusion in these areas is facilitated, although some of the enhancing components are dark on T2 signal. 2. Evolution and/or regression of signal abnormality in the left parietal and superior right frontal lobes since January 2017 and March 2016, respectively. Small foci of cortical encephalomalacia developed at both areas. But while the left parietal FLAIR signal abnormality nearly resolved, that in the right frontal lobe did not. 3. 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 Other top differential diagnosis are Atypical CNS Infection and Neurosarcoidosis. Tumefactive demyelination is felt less likely. Glioblastoma or glioma ptosis is felt unlikely since the left parietal involvement largely resolved. Electronically Signed   By: Genevie Ann M.D.   On: 05/21/2016 12:11   Ct Abdomen Pelvis W Contrast  Result Date: 05/22/2016 CLINICAL DATA:  History of waxing and waning abnormal  intracranial enhancement, suspect brain tumor, possible metastasis. History of hypertension, diabetes, stroke. History of cholecystectomy, appendectomy and hysterectomy. EXAM: CT CHEST, ABDOMEN, AND PELVIS WITH CONTRAST TECHNIQUE: Multidetector CT imaging of the chest, abdomen and pelvis was performed following the standard protocol during bolus administration of intravenous contrast. CONTRAST:  100 cc ISOVUE-300 IOPAMIDOL (ISOVUE-300) INJECTION 61% COMPARISON:  MRI head May 21, 2016 and multiple priors. CT abdomen and pelvis May 29, 2015. FINDINGS: CT CHEST FINDINGS CARDIOVASCULAR: Heart size is normal. Mild coronary artery calcifications. Mild pericardial wall thickening. Thoracic aorta is normal course and caliber, mild calcific atherosclerosis. MEDIASTINUM/NODES: No mediastinal mass. No lymphadenopathy by CT size criteria. Small calcified LEFT hilar lymph nodes. Normal appearance of thoracic esophagus though not tailored for evaluation. Increasingly prominent chylous duct at the crus of the diaphragm. LUNGS/PLEURA: Tracheobronchial tree is patent, no pneumothorax. Scattered calcified granulomas. Enhancing dependent atelectasis. Trace pleural effusions. No focal consolidation, nodules or masses. MUSCULOSKELETAL: Mild T3, mild to moderate T4 through T6 and mild T7 age indeterminate compression fractures. No destructive bony lesions. ACDF. Mildly widened RIGHT acromioclavicular joint space most compatible with old injury. CT ABDOMEN AND PELVIS FINDINGS HEPATOBILIARY: Liver is normal.  Status post cholecystectomy. PANCREAS: Normal. SPLEEN: Normal. ADRENALS/URINARY TRACT: Kidneys are orthotopic, demonstrating symmetric enhancement. Too small to characterize RIGHT interpolar hypodensity. 1 cm LEFT lower pole and RIGHT interpolar cysts.  No nephrolithiasis, hydronephrosis or solid renal masses. The unopacified ureters are normal in course and caliber. Delayed imaging through the kidneys demonstrates symmetric  prompt contrast excretion within the proximal urinary collecting system. Urinary bladder is partially distended and unremarkable. Normal adrenal glands. STOMACH/BOWEL: The stomach, small and large bowel are normal in course and caliber without inflammatory changes. Moderate descending and sigmoid diverticulosis. Mild amount of retained large bowel stool. VASCULAR/LYMPHATIC: Aortoiliac vessels are normal in course and caliber, moderate calcific atherosclerosis. No lymphadenopathy by CT size criteria. REPRODUCTIVE: Status post hysterectomy. OTHER: No intraperitoneal free fluid or free air. MUSCULOSKELETAL: Non-acute. Gluteal injection granulomas. Anterior abdominal wall ligamentous laxity. L2 benign hemangioma. Mild lower lumbar facet arthropathy. Anterior pelvic wall scarring. IMPRESSION: CT CHEST:  No CT findings of neoplasm in the chest. Trace pleural effusions and bibasilar atelectasis. Old granulomatous disease. Old multiple mild and mild to moderate age indeterminate upper thoracic compression fractures. CT ABDOMEN AND PELVIS: No CT findings of neoplasm in the abdomen or pelvis. Diverticulosis without acute diverticulitis. Status post cholecystectomy, appendectomy and hysterectomy. Electronically Signed   By: Elon Alas M.D.   On: 05/22/2016 14:17   Nm Myocar Multi W/spect W/wall Motion / Ef  Result Date: 05/24/2016 CLINICAL DATA:  Abnormal brain MRI, smoker, diabetes, hypertension, elevated troponin EXAM: MYOCARDIAL IMAGING WITH SPECT (REST AND PHARMACOLOGIC-STRESS) GATED LEFT VENTRICULAR WALL MOTION STUDY LEFT VENTRICULAR EJECTION FRACTION TECHNIQUE: Standard myocardial SPECT imaging was performed after resting intravenous injection of 10 mCi Tc-40mtetrofosmin. Subsequently, intravenous infusion of Lexiscan was performed under the supervision of the Cardiology staff. At peak effect of the drug, 30 mCi Tc-928metrofosmin was injected intravenously and standard myocardial SPECT imaging was performed.  Quantitative gated imaging was also performed to evaluate left ventricular wall motion, and estimate left ventricular ejection fraction. COMPARISON:  None. FINDINGS: Perfusion: Large fixed defect of the anterior apical walls extending into the septum. Mid inferior wall decreased activity on the stress views compared to the rest views on both the short axis and vertical long axis imaging suspicious for inferior wall inducible ischemia. Wall Motion: Anterior apical hypokinesis noted and septal dyskinesis. No LV chamber dilatation. Left Ventricular Ejection Fraction: 47 % End diastolic volume 89 ml End systolic volume 47 ml IMPRESSION: 1. Large fixed anteroapical wall defect extending into the septum Mid inferior wall inducible ischemia suspected with pharmacologic stress. 2. Anterior apical hypokinesis. Septal dyskinesis. No LV chamber dilatation. 3. Left ventricular ejection fraction 47% 4. Non invasive risk stratification*: High *2012 Appropriate Use Criteria for Coronary Revascularization Focused Update: J Am Coll Cardiol. 200962;83(6):629-476http://content.[email or web address]?[email or web address] Electronically Signed   By: M.Jerilynn Mages Shick M.D.   On: 05/24/2016 12:52   Dg Chest Port 1 View  Result Date: 05/26/2016 CLINICAL DATA:  Post CVL insertion. EXAM: PORTABLE CHEST - 1 VIEW COMPARISON:  04/30/2016 FINDINGS: Right IJ Central venous catheter to the proximal SVC. No pneumothorax. Linear scarring in the left infrahilar region. Lungs otherwise clear. Heart size upper limits normal for technique. No effusion. Cervical fixation hardware noted. IMPRESSION: 1. Central line to SVC without pneumothorax. Electronically Signed   By: D Lucrezia Europe.D.   On: 05/26/2016 15:48    Microbiology: Recent Results (from the past 240 hour(s))  Culture, Urine     Status: Abnormal   Collection Time: 05/20/16 11:08 PM  Result Value Ref Range Status   Specimen Description URINE, RANDOM  Final   Special Requests NONE  Final    Culture (A)  Final    10,000 COLONIES/mL ESCHERICHIA COLI Confirmed  Extended Spectrum Beta-Lactamase Producer (ESBL)    Report Status 05/23/2016 FINAL  Final   Organism ID, Bacteria ESCHERICHIA COLI (A)  Final      Susceptibility   Escherichia coli - MIC*    AMPICILLIN >=32 RESISTANT Resistant     CEFAZOLIN >=64 RESISTANT Resistant     CEFTRIAXONE >=64 RESISTANT Resistant     CIPROFLOXACIN >=4 RESISTANT Resistant     GENTAMICIN <=1 SENSITIVE Sensitive     IMIPENEM <=0.25 SENSITIVE Sensitive     NITROFURANTOIN >=512 RESISTANT Resistant     TRIMETH/SULFA <=20 SENSITIVE Sensitive     AMPICILLIN/SULBACTAM >=32 RESISTANT Resistant     PIP/TAZO <=4 SENSITIVE Sensitive     Extended ESBL POSITIVE Resistant     * 10,000 COLONIES/mL ESCHERICHIA COLI     Labs: Basic Metabolic Panel:  Recent Labs Lab 05/23/16 1305 05/24/16 0021 05/25/16 0141 05/26/16 0217 05/27/16 0417 05/28/16 0436  NA 135 136 139 138  --  140  K 3.1* 3.3* 4.0 3.3*  --  4.6  CL 103 105 109 108  --  111  CO2 '23 23 23 '$ 21*  --  21*  GLUCOSE 131* 145* 133* 133*  --  170*  BUN '10 11 14 15  '$ --  19  CREATININE 0.81 0.92 1.04* 0.82  --  0.81  CALCIUM 9.6 9.5 9.6 9.7  --  10.3  MG 1.6* 2.2 2.0 1.9 1.8 1.9   Liver Function Tests: No results for input(s): AST, ALT, ALKPHOS, BILITOT, PROT, ALBUMIN in the last 168 hours. No results for input(s): LIPASE, AMYLASE in the last 168 hours. No results for input(s): AMMONIA in the last 168 hours. CBC: No results for input(s): WBC, NEUTROABS, HGB, HCT, MCV, PLT in the last 168 hours. Cardiac Enzymes:  Recent Labs Lab 05/23/16 1305 05/23/16 1828 05/24/16 0021  TROPONINI 0.11* 0.10* 0.10*   BNP: BNP (last 3 results)  Recent Labs  12/26/15 1414  BNP 270.2*    ProBNP (last 3 results) No results for input(s): PROBNP in the last 8760 hours.  CBG:  Recent Labs Lab 05/27/16 0750 05/27/16 1405 05/27/16 1610 05/27/16 2147 05/28/16 0632  GLUCAP 178* 213* 207* 216*  160*       Signed:  Dia Crawford, MD [facility] [phone] pager

## 2016-05-23 NOTE — Progress Notes (Addendum)
Anesthesia Chart Review: Patient is a 70 year old female scheduled for craniotomy tumor excision on 05/26/16 by Dr. Arnoldo Morale. She was initially scheduled for left parietal craniotomy on 08/15/15, but when she arrived for MRI for Brain Lab neuro navigation the brain lesions and edema had significantly decreased in size and surgery was cancelled with plans for follow-up MRI. Currently, she is re-hospitalized since 05/19/16 for seizure.   History includes HTN, DM2, hyperlipidemia, stroke ('12 and 09/2014), OSA (on CPAP), GERD, smoking, hysterectomy, neck and back surgery, cholecystectomy. - Admitted 09/25/15 - 09/28/15 for visual changes, headaches, hypotension. There was question of brain tumor versus venous infarcts on repeat MRI. Decadron was resumed. Hypercoaguable work-up recommended (see 09/2015 labs). Of note, she had 2.81 second sinus pause on 09/27/15, so her b-blocker was held.  - Admitted 12/26/15 - 12/28/15 for acute encephalopathy in the setting of AKI and Klebsiella bacteriuria.  - Admitted 05/19/16 - present for tonic clonic seizure. Given midazolam by EMS. Topamax increased and Keppra added. Lactate and troponin elevated (peak 1.61; possibly related to seizures). MRI showed multiple intracranial lesions. Neurology and neurosurgery consulted. 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 He recommended suboccipital craniectomy for open biopsy/resection. He did not want her back on steroids for worry that the lesions would disappear again and biopsy could not be done. She wanted to be discharged on 05/23/16, but due to elevated troponins with multiple CAD risk factors, anesthesia (discussed with Dr. Linna Caprice) requested cardiology consult (unless Hospitalist was confident that this was more likely a demand ischemic situation from her seizures). Angelena Form, PA-C and Dr. Shelva Majestic with [facility] has evaluated and at this point a nuclear stress test is planned and if no high risk  features patient could proceed with surgery. She has also been treated for hypokalemia and E. Coli ESBL UTI. Palliative Care is also following.  PCP is Cyndi Bender, PA-C. Neurologist is Dr. Tomi Likens.  Meds currently listed include lisinopril, Coreg, fosfomycin, Keppra, Xanax, Lipitor, Flonase, hydralazine, Novolog, Lovenox, albuterol.   05/23/16 EKG: NSR, ST/T wave abnormality, consider anterior ischemia. Anterior T wave abnormality appears new when compared to 05/19/16 and 12/26/15 tracings.  09/27/15 Echo: Study Conclusions - Left ventricle: The cavity size was normal. Wall thickness was   increased in a pattern of mild LVH. Systolic function was normal.   The estimated ejection fraction was in the range of 55% to 60%.   Wall motion was normal; there were no regional wall motion   abnormalities. - Mitral valve: There was mild regurgitation.  10/24/15 Carotid U/S: IMPRESSION: 1. Mild bilateral carotid bifurcation and proximal ICA plaque resulting in less than 50% diameter stenosis. The exam does not exclude plaque ulceration or embolization. Continued surveillance recommended.  12/26/15 Renal U/S: IMPRESSION: Normal sized kidneys without evidence of hydronephrosis or significant atrophy.  05/22/16 CT Chest/Abd/Pelvis: IMPRESSION: CT CHEST:  No CT findings of neoplasm in the chest. Trace pleural effusions and bibasilar atelectasis. Old granulomatous disease. Old multiple mild and mild to moderate age indeterminate upper thoracic compression fractures. CT ABDOMEN AND PELVIS: No CT findings of neoplasm in the abdomen or pelvis. Diverticulosis without acute diverticulitis. Status post cholecystectomy, appendectomy and hysterectomy.  05/21/16 MRI Brain: IMPRESSION: 1. Moderate to severe abnormal enhancement and signal abnormality within infiltrative appearance in both inferior frontal gyri, the genu of the corpus callosum, and separately in the right cerebellum. There is nonenhancing T2 signal  abnormality extending into the right hypothalamus. Mild associated regional mass effect. Diffusion in these  areas is facilitated, although some of the enhancing components are dark on T2 signal. 2. Evolution and/or regression of signal abnormality in the left parietal and superior right frontal lobes since January 2017 and March 2016, respectively. Small foci of cortical encephalomalacia developed at both areas. But while the left parietal FLAIR signal abnormality nearly resolved, that in the right frontal lobe did not. 3. 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 Other top differential diagnosis are Atypical CNS Infection and Neurosarcoidosis. Tumefactive demyelination is felt less likely. Glioblastoma or glioma ptosis is felt unlikely since the left parietal involvement largely resolved.  Labs from 05/23/16 1305 noted. K 3.1. Cr 0.81. Troponin continues to trend down (0.11). Glucose 131. (A1c 12/27/15 was 6.0.)  Nikki from at Dr. Adline Mango office called me this morning to let me know that patient was wanting to go home today. However, after reviewing patient's chart and speaking with anesthesiologist Dr. Linna Caprice and Hospitalist Dr. Sherral Hammers a decision was made for cardiology pre-operative evaluation. Palliative Care is also helping patient complete DPOA paperwork. It is now anticipated that she will likely remain admitted over the weekend. Lexine Baton reports that Dr. Adline Mango pre-operative orders have been entered if patient remains admitted, but that she also entered discharge pre-operative instructions and spoke with nursing staff in case patient is discharged home prior to her 05/26/16 surgery date.   Cardiology is going to attempt to get stress test done on Saturday 05/24/16. Hopefully, if any high risk features, cardiology or the Hospitalist Service will get in touch with neurosurgery over the weekend.   [facility] Short Stay Center/Anesthesiology Phone  [phone] 05/23/2016 3:51 PM  Addendum: Echo repeated and stress done since Friday.   05/25/16 Echo: Study Conclusions - Left ventricle: The cavity size was normal. Wall thickness was   increased in a pattern of mild LVH. There was focal basal   hypertrophy. Systolic function was mildly to moderately reduced.   The estimated ejection fraction was in the range of 40% to 45%.   Doppler parameters are consistent with abnormal left ventricular   relaxation (grade 1 diastolic dysfunction). - Regional wall motion abnormality: Hypokinesis of the mid-apical   anterior, basal-mid anteroseptal, apical inferior, apical septal,   apical lateral, and apical myocardium. - Aortic valve: There was mild regurgitation. - Atrial septum: No defect or patent foramen ovale was identified. - Technically adequate study.  05/24/16 Nuclear stress test: IMPRESSION: 1. Large fixed anteroapical wall defect extending into the septum Mid inferior wall inducible ischemia suspected with pharmacologic stress. 2. Anterior apical hypokinesis. Septal dyskinesis. No LV chamber dilatation. 3. Left ventricular ejection fraction 47% 4. Non invasive risk stratification*: High *2012 Appropriate Use Criteria for Coronary Revascularization Focused Update: J Am Coll Cardiol. 0623;76(2):831-517. http://content.[email or web address].aspx?articleid=[email or web address]  Following review of studies, Dr. Harl Bowie wrote,  "Echo completed, shows LVEF 40-45% with hypokinesis of portions of the anteroseptal, anterior and apical walls consistent with the scar seen on nuclear imaging. Appears she had a prior infarct in the past, though this pattern can also be seen in [place] CM  Overall difficult situation. Admitted with seizures and found to have brain lesions concerning for possible malignancy. 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 There is only mild amount of myocardium currently  at jeopardy. Note such a nuclear imaging defect and WMAs can be seen in [place] CM as well. She was admitted with elevated troponin in setting of seizures. She has atypical positional chest pain. She  requires neurosurgical biopsy of these brain lesions. She would not be a candidate for PCI given her brain lesions and need for DAPT, we would not delay her biopsy the month that would be required if a stent was placed. In essence with all things considered she is at increased but not prohibitive cardiac risk for procedure, we do not have any significant options to lower this risk other than medical therapy at this time. Would recommend proceeding with biopsy as planned, continue beta blocker."  [facility] Short Stay Center/Anesthesiology Phone [phone] 05/26/2016 9:35 AM

## 2016-05-23 NOTE — Progress Notes (Addendum)
PROGRESS NOTE    Kristi Kramer  STM:[id number] DOB: 12/02/1945 DOA: 05/18/2016 PCP: Cyndi Bender, PA-C   Brief Narrative:  Kristi Kramer is a 70 y.o.WF PMHx  Anxiety, CVA with left-sided weakness, HTN, DM Type 2 uncontrolled with complications, COPD, OSA,   Brought to the ER after patient had a witnessed seizure. Patient's boyfriend saw the patient had tonic-clonic seizure. EMS was called and after EMS arrival patient had another tonic-clonic seizure and was given Midazolam. Patient was postictal in the ER on arrival. CT head is unremarkable. Neurologist was consulted and patient was placed on loading dose of Keppra. After neurological evaluation patient was placed on increased dose of Topamax which patient usually takes for headaches. On exam patient is still post ictal but follows commands and moves all extremities. Pupils are reacting. No further neurological exam possible since patient is still postictal.    Subjective: 11/3  A/O 4, sons and daughter present at bedside, for discussion of plan of care. Patient's only concern is that she be able to bathe over the weekend. Negative CP, negative SOB, negative HA    Assessment & Plan:   Principal Problem:   Seizures ([place]) Active Problems:   Type 2 diabetes mellitus (HCC)   Essential hypertension   OSA (obstructive sleep apnea)   Seizure (HCC)   Abnormal MRI   Polypharmacy   Controlled diabetes mellitus type 2 with complications (HCC)  Seizures -EEG: Abnormal could be consistent with structural abnormality see results below  Brain metastases/Primary Brain Lymphoma -Received call from [facility]: Believe they may see metastases/masses on normal brain MRI.  -contrast MRI. 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 Newman Pies Neurosurgery has seen previously March 2017 -CT Chest, Abdomen, Pelvis: Negative for neoplasm  -11/3 although  patient's initial increased troponin most likely secondary to seizure, Neurosurgery has requested that cardiology clear patient prior surgical brain biopsy Monday.  Stroke (history of) -CT scan showed old strokes see results below  OSA -CPAP per respiratory therapy  COPD -Currently asymptomatic monitor closely  HTN -Hydralazine PRN  Elevated troponins -Most likely secondary to seizures -Will trend per neurosurgery request. Cardiology consult placed per Neurosurgery request.   Possible UTI -UA not convincing for true UTI and is more suggestive of dirty collection - complete 3 days of abx only unless culture more revealing   Diabetes type 2 controlled with, complications -Sensitive SSI  Hypokalemia -K-Dur 50 mEq  Hypomagnesemia -Magnesium IV 2 g     Goals of care  -Obtain Palliative Care Consult: Patient and significant other (Ernie) would like to speak with member of palliative care team to discuss appointing primary and secondary HCPOA, CODE STATUS, short-term and long-term goals of care.    DVT prophylaxis: Lovenox Code Status: Full Family Communication:   Family present for discussion of plan of care Disposition Plan: Per neurosurgery   Consultants:  Dr.Karen Jacquen[facility] Dr. Newman Pies Neurosurgery pending    Procedures/Significant Events:  10/30 CT head WO contrast:-No acute intracranial pathology. - Small chronic infarcts at the high parietal lobes bilaterally. EEG - 10/30 - focal slowing over L hemisphere - frequent broad sharp waves over L frontopolar region  10/31 brain MRI:-New cerebral signal abnormality and swelling centered on the genu of the corpus callosum. Edematous change also present in the right cerebellum. DDX: Tumefactive demyelination, CNS lymphoma, or atypical infection are primary considerations. 11/1 MRI Brain w/wo Contrast; -Moderate to severe abnormal enhancement and  signal abnormality within infiltrative appearance in  both inferior frontal gyri, the genu of the corpus callosum, and separately in the right cerebellum. --Nonenhancing T2 signal abnormality extending into the right hypothalamus. Mild associated regional mass effect.  -Evolution and/or regression of signal abnormality in the left parietal and superior right frontal lobes since January 2017 and March 2016, respectively.  -Small foci of cortical encephalomalacia developed at both areas. 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  -Other top differential diagnosis are Atypical CNS Infection and Neurosarcoidosis.    Cultures 10/30 MRSA by PCR negative 10/31 urine  pending   Antimicrobials: Rocephin 10/29 > 11/1   Devices    LINES / TUBES:      Continuous Infusions:    Objective: Vitals:   05/23/16 0341 05/23/16 0742 05/23/16 0753 05/23/16 0940  BP: (!) 150/81 (!) 150/118 (!) 155/84 (!) 152/79  Pulse: (!) 58 70 64 66  Resp: (!) '22 20 16   '$ Temp:  97.9 F (36.6 C)    TempSrc:  Oral    SpO2: 96% 100% 97%   Weight:      Height:        Intake/Output Summary (Last 24 hours) at 05/23/16 1225 Last data filed at 05/23/16 1610  Gross per 24 hour  Intake              600 ml  Output             1000 ml  Net             -400 ml   Filed Weights   05/19/16 0958  Weight: 94.3 kg (208 lb)    Examination:  General: A/O 4, No acute respiratory distress Eyes: negative scleral hemorrhage, negative anisocoria, negative icterus ENT: Negative Runny nose, negative gingival bleeding, Neck:  Negative scars, masses, torticollis, lymphadenopathy, JVD Lungs: Clear to auscultation bilaterally without wheezes or crackles Cardiovascular: Regular rate and rhythm without murmur gallop or rub normal S1 and S2 Abdomen: negative abdominal pain, nondistended, positive soft, bowel sounds, no rebound, no ascites, no appreciable mass Extremities: No significant cyanosis, clubbing, or edema bilateral lower  extremities Skin: Negative rashes, lesions, ulcers Psychiatric:  Negative depression, negative anxiety, negative fatigue, negative mania  Central nervous system:  Cranial nerves II through XII intact, tongue/uvula midline, all extremities muscle strength 5/5, sensation intact throughout,negative dysarthria, negative expressive aphasia, negative receptive aphasia.  .     Data Reviewed: Care during the described time interval was provided by me .  I have reviewed this patient's available data, including medical history, events of note, physical examination, and all test results as part of my evaluation. I have personally reviewed and interpreted all radiology studies.  CBC:  Recent Labs Lab 05/19/16 0014 05/19/16 0538 05/20/16 0650 05/22/16 0255  WBC 13.9* 13.4* 7.1 7.8  NEUTROABS  --  11.1*  --   --   HGB 14.4 12.9 11.9* 12.5  HCT 42.7 37.0 35.2* 36.2  MCV 88.8 84.3 86.3 84.0  PLT 274 237 226 960   Basic Metabolic Panel:  Recent Labs Lab 05/19/16 0014 05/19/16 0051 05/19/16 0310 05/19/16 0538 05/20/16 0650 05/22/16 0255  NA 139 137  --  139 140 136  K 3.8 2.8*  --  2.9* 3.3* 2.8*  CL 100* 102  --  103 108 102  CO2 17* 21*  --  '27 24 25  '$ GLUCOSE 180* 197*  --  134* 122* 132*  BUN 23* 22*  --  $'19 16 8  'V$ CREATININE 1.10* 1.05*  --  0.91 0.88 0.86  CALCIUM 9.7 9.4  --  9.2 9.1 9.3  MG  --   --  1.9  --   --   --    GFR: Estimated Creatinine Clearance: 71.1 mL/min (by C-G formula based on SCr of 0.86 mg/dL). Liver Function Tests:  Recent Labs Lab 05/19/16 0051 05/19/16 0538 05/20/16 0650  AST '22 22 29  '$ ALT 13* 13* 13*  ALKPHOS 79 78 69  BILITOT 0.4 0.6 0.9  PROT 6.8 6.4* 5.9*  ALBUMIN 3.6 3.5 3.1*   No results for input(s): LIPASE, AMYLASE in the last 168 hours. No results for input(s): AMMONIA in the last 168 hours. Coagulation Profile:  Recent Labs Lab 05/22/16 0255  INR 1.08   Cardiac Enzymes:  Recent Labs Lab 05/19/16 0031 05/19/16 0538  05/19/16 1048 05/19/16 1812 05/19/16 2350 05/20/16 0650  CKTOTAL 52  --   --   --   --   --   CKMB 2.1  --   --   --   --   --   TROPONINI  --  1.21* 0.76* 1.59* 1.61* 1.25*   BNP (last 3 results) No results for input(s): PROBNP in the last 8760 hours. HbA1C: No results for input(s): HGBA1C in the last 72 hours. CBG:  Recent Labs Lab 05/22/16 0809 05/22/16 1238 05/22/16 1649 05/22/16 2129 05/23/16 0740  GLUCAP 123* 153* 157* 112* 117*   Lipid Profile: No results for input(s): CHOL, HDL, LDLCALC, TRIG, CHOLHDL, LDLDIRECT in the last 72 hours. Thyroid Function Tests: No results for input(s): TSH, T4TOTAL, FREET4, T3FREE, THYROIDAB in the last 72 hours. Anemia Panel: No results for input(s): VITAMINB12, FOLATE, FERRITIN, TIBC, IRON, RETICCTPCT in the last 72 hours. Urine analysis:    Component Value Date/Time   COLORURINE YELLOW 05/19/2016 0245   APPEARANCEUR CLOUDY (A) 05/19/2016 0245   LABSPEC 1.019 05/19/2016 0245   PHURINE 6.0 05/19/2016 0245   GLUCOSEU NEGATIVE 05/19/2016 0245   HGBUR SMALL (A) 05/19/2016 0245   BILIRUBINUR NEGATIVE 05/19/2016 0245   KETONESUR NEGATIVE 05/19/2016 0245   PROTEINUR NEGATIVE 05/19/2016 0245   UROBILINOGEN 0.2 05/31/2015 1420   NITRITE POSITIVE (A) 05/19/2016 0245   LEUKOCYTESUR NEGATIVE 05/19/2016 0245   Sepsis Labs: '@LABRCNTIP'$ (procalcitonin:4,lacticidven:4)  ) Recent Results (from the past 240 hour(s))  Culture, Urine     Status: Abnormal   Collection Time: 05/19/16  2:45 AM  Result Value Ref Range Status   Specimen Description URINE, RANDOM  Final   Special Requests NONE  Final   Culture (A)  Final    >=100,000 COLONIES/mL ESCHERICHIA COLI Confirmed Extended Spectrum Beta-Lactamase Producer (ESBL)    Report Status 05/23/2016 FINAL  Final   Organism ID, Bacteria ESCHERICHIA COLI (A)  Final      Susceptibility   Escherichia coli - MIC*    AMPICILLIN >=32 RESISTANT Resistant     CEFAZOLIN >=64 RESISTANT Resistant      CEFTRIAXONE >=64 RESISTANT Resistant     CIPROFLOXACIN >=4 RESISTANT Resistant     GENTAMICIN <=1 SENSITIVE Sensitive     IMIPENEM <=0.25 SENSITIVE Sensitive     NITROFURANTOIN >=512 RESISTANT Resistant     TRIMETH/SULFA <=20 SENSITIVE Sensitive     AMPICILLIN/SULBACTAM >=32 RESISTANT Resistant     PIP/TAZO <=4 SENSITIVE Sensitive     Extended ESBL POSITIVE Resistant     * >=100,000 COLONIES/mL ESCHERICHIA COLI  MRSA PCR Screening     Status: None   Collection Time:  05/19/16  9:52 AM  Result Value Ref Range Status   MRSA by PCR NEGATIVE NEGATIVE Final    Comment:        The GeneXpert MRSA Assay (FDA approved for NASAL specimens only), is one component of a comprehensive MRSA colonization surveillance program. It is not intended to diagnose MRSA infection nor to guide or monitor treatment for MRSA infections.   Culture, Urine     Status: Abnormal   Collection Time: 05/20/16 11:08 PM  Result Value Ref Range Status   Specimen Description URINE, RANDOM  Final   Special Requests NONE  Final   Culture (A)  Final    10,000 COLONIES/mL ESCHERICHIA COLI Confirmed Extended Spectrum Beta-Lactamase Producer (ESBL)    Report Status 05/23/2016 FINAL  Final   Organism ID, Bacteria ESCHERICHIA COLI (A)  Final      Susceptibility   Escherichia coli - MIC*    AMPICILLIN >=32 RESISTANT Resistant     CEFAZOLIN >=64 RESISTANT Resistant     CEFTRIAXONE >=64 RESISTANT Resistant     CIPROFLOXACIN >=4 RESISTANT Resistant     GENTAMICIN <=1 SENSITIVE Sensitive     IMIPENEM <=0.25 SENSITIVE Sensitive     NITROFURANTOIN >=512 RESISTANT Resistant     TRIMETH/SULFA <=20 SENSITIVE Sensitive     AMPICILLIN/SULBACTAM >=32 RESISTANT Resistant     PIP/TAZO <=4 SENSITIVE Sensitive     Extended ESBL POSITIVE Resistant     * 10,000 COLONIES/mL ESCHERICHIA COLI         Radiology Studies: Ct Chest W Contrast  Result Date: 05/22/2016 CLINICAL DATA:  History of waxing and waning abnormal  intracranial enhancement, suspect brain tumor, possible metastasis. History of hypertension, diabetes, stroke. History of cholecystectomy, appendectomy and hysterectomy. EXAM: CT CHEST, ABDOMEN, AND PELVIS WITH CONTRAST TECHNIQUE: Multidetector CT imaging of the chest, abdomen and pelvis was performed following the standard protocol during bolus administration of intravenous contrast. CONTRAST:  100 cc ISOVUE-300 IOPAMIDOL (ISOVUE-300) INJECTION 61% COMPARISON:  MRI head May 21, 2016 and multiple priors. CT abdomen and pelvis May 29, 2015. FINDINGS: CT CHEST FINDINGS CARDIOVASCULAR: Heart size is normal. Mild coronary artery calcifications. Mild pericardial wall thickening. Thoracic aorta is normal course and caliber, mild calcific atherosclerosis. MEDIASTINUM/NODES: No mediastinal mass. No lymphadenopathy by CT size criteria. Small calcified LEFT hilar lymph nodes. Normal appearance of thoracic esophagus though not tailored for evaluation. Increasingly prominent chylous duct at the crus of the diaphragm. LUNGS/PLEURA: Tracheobronchial tree is patent, no pneumothorax. Scattered calcified granulomas. Enhancing dependent atelectasis. Trace pleural effusions. No focal consolidation, nodules or masses. MUSCULOSKELETAL: Mild T3, mild to moderate T4 through T6 and mild T7 age indeterminate compression fractures. No destructive bony lesions. ACDF. Mildly widened RIGHT acromioclavicular joint space most compatible with old injury. CT ABDOMEN AND PELVIS FINDINGS HEPATOBILIARY: Liver is normal.  Status post cholecystectomy. PANCREAS: Normal. SPLEEN: Normal. ADRENALS/URINARY TRACT: Kidneys are orthotopic, demonstrating symmetric enhancement. Too small to characterize RIGHT interpolar hypodensity. 1 cm LEFT lower pole and RIGHT interpolar cysts. No nephrolithiasis, hydronephrosis or solid renal masses. The unopacified ureters are normal in course and caliber. Delayed imaging through the kidneys demonstrates symmetric  prompt contrast excretion within the proximal urinary collecting system. Urinary bladder is partially distended and unremarkable. Normal adrenal glands. STOMACH/BOWEL: The stomach, small and large bowel are normal in course and caliber without inflammatory changes. Moderate descending and sigmoid diverticulosis. Mild amount of retained large bowel stool. VASCULAR/LYMPHATIC: Aortoiliac vessels are normal in course and caliber, moderate calcific atherosclerosis. No lymphadenopathy by CT size criteria. REPRODUCTIVE:  Status post hysterectomy. OTHER: No intraperitoneal free fluid or free air. MUSCULOSKELETAL: Non-acute. Gluteal injection granulomas. Anterior abdominal wall ligamentous laxity. L2 benign hemangioma. Mild lower lumbar facet arthropathy. Anterior pelvic wall scarring. IMPRESSION: CT CHEST:  No CT findings of neoplasm in the chest. Trace pleural effusions and bibasilar atelectasis. Old granulomatous disease. Old multiple mild and mild to moderate age indeterminate upper thoracic compression fractures. CT ABDOMEN AND PELVIS: No CT findings of neoplasm in the abdomen or pelvis. Diverticulosis without acute diverticulitis. Status post cholecystectomy, appendectomy and hysterectomy. Electronically Signed   By: Elon Alas M.D.   On: 05/22/2016 14:17   Ct Abdomen Pelvis W Contrast  Result Date: 05/22/2016 CLINICAL DATA:  History of waxing and waning abnormal intracranial enhancement, suspect brain tumor, possible metastasis. History of hypertension, diabetes, stroke. History of cholecystectomy, appendectomy and hysterectomy. EXAM: CT CHEST, ABDOMEN, AND PELVIS WITH CONTRAST TECHNIQUE: Multidetector CT imaging of the chest, abdomen and pelvis was performed following the standard protocol during bolus administration of intravenous contrast. CONTRAST:  100 cc ISOVUE-300 IOPAMIDOL (ISOVUE-300) INJECTION 61% COMPARISON:  MRI head May 21, 2016 and multiple priors. CT abdomen and pelvis May 29, 2015.  FINDINGS: CT CHEST FINDINGS CARDIOVASCULAR: Heart size is normal. Mild coronary artery calcifications. Mild pericardial wall thickening. Thoracic aorta is normal course and caliber, mild calcific atherosclerosis. MEDIASTINUM/NODES: No mediastinal mass. No lymphadenopathy by CT size criteria. Small calcified LEFT hilar lymph nodes. Normal appearance of thoracic esophagus though not tailored for evaluation. Increasingly prominent chylous duct at the crus of the diaphragm. LUNGS/PLEURA: Tracheobronchial tree is patent, no pneumothorax. Scattered calcified granulomas. Enhancing dependent atelectasis. Trace pleural effusions. No focal consolidation, nodules or masses. MUSCULOSKELETAL: Mild T3, mild to moderate T4 through T6 and mild T7 age indeterminate compression fractures. No destructive bony lesions. ACDF. Mildly widened RIGHT acromioclavicular joint space most compatible with old injury. CT ABDOMEN AND PELVIS FINDINGS HEPATOBILIARY: Liver is normal.  Status post cholecystectomy. PANCREAS: Normal. SPLEEN: Normal. ADRENALS/URINARY TRACT: Kidneys are orthotopic, demonstrating symmetric enhancement. Too small to characterize RIGHT interpolar hypodensity. 1 cm LEFT lower pole and RIGHT interpolar cysts. No nephrolithiasis, hydronephrosis or solid renal masses. The unopacified ureters are normal in course and caliber. Delayed imaging through the kidneys demonstrates symmetric prompt contrast excretion within the proximal urinary collecting system. Urinary bladder is partially distended and unremarkable. Normal adrenal glands. STOMACH/BOWEL: The stomach, small and large bowel are normal in course and caliber without inflammatory changes. Moderate descending and sigmoid diverticulosis. Mild amount of retained large bowel stool. VASCULAR/LYMPHATIC: Aortoiliac vessels are normal in course and caliber, moderate calcific atherosclerosis. No lymphadenopathy by CT size criteria. REPRODUCTIVE: Status post hysterectomy. OTHER: No  intraperitoneal free fluid or free air. MUSCULOSKELETAL: Non-acute. Gluteal injection granulomas. Anterior abdominal wall ligamentous laxity. L2 benign hemangioma. Mild lower lumbar facet arthropathy. Anterior pelvic wall scarring. IMPRESSION: CT CHEST:  No CT findings of neoplasm in the chest. Trace pleural effusions and bibasilar atelectasis. Old granulomatous disease. Old multiple mild and mild to moderate age indeterminate upper thoracic compression fractures. CT ABDOMEN AND PELVIS: No CT findings of neoplasm in the abdomen or pelvis. Diverticulosis without acute diverticulitis. Status post cholecystectomy, appendectomy and hysterectomy. Electronically Signed   By: Elon Alas M.D.   On: 05/22/2016 14:17        Scheduled Meds: . atorvastatin  20 mg Oral q1800  . carvedilol  3.125 mg Oral BID WC  . citalopram  20 mg Oral Daily  . enoxaparin (LOVENOX) injection  40 mg Subcutaneous Q24H  . fluticasone  1-2 spray Each Nare BID  . fosfomycin  3 g Oral Q48H  . insulin aspart  0-9 Units Subcutaneous TID WC  . levETIRAcetam  500 mg Oral BID  . pantoprazole  40 mg Oral Daily  . topiramate  50 mg Oral BID   Continuous Infusions:    LOS: 4 days    Time spent: 40 minutes    Avier Jech, Geraldo Docker, MD [facility] Pager [phone]   If 7PM-7AM, please contact night-coverage [email or web address] Password [email or web address] 05/23/2016, 12:25 PM

## 2016-05-23 NOTE — Progress Notes (Signed)
Pt places herself on home CPAP.

## 2016-05-24 ENCOUNTER — Encounter (HOSPITAL_COMMUNITY): Payer: Self-pay | Admitting: Radiology

## 2016-05-24 ENCOUNTER — Inpatient Hospital Stay (HOSPITAL_COMMUNITY): Payer: Medicare Other

## 2016-05-24 DIAGNOSIS — R748 Abnormal levels of other serum enzymes: Secondary | ICD-10-CM

## 2016-05-24 LAB — GLUCOSE, CAPILLARY
GLUCOSE-CAPILLARY: 194 mg/dL — AB (ref 65–99)
GLUCOSE-CAPILLARY: 114 mg/dL — AB (ref 65–99)
Glucose-Capillary: 116 mg/dL — ABNORMAL HIGH (ref 65–99)
Glucose-Capillary: 126 mg/dL — ABNORMAL HIGH (ref 65–99)

## 2016-05-24 LAB — NM MYOCAR MULTI W/SPECT W/WALL MOTION / EF
CSEPEDS: 0 s
CSEPEW: 1 METS
CSEPHR: 73 %
CSEPPHR: 111 {beats}/min
Exercise duration (min): 0 min
MPHR: 152 {beats}/min
Rest HR: 74 {beats}/min

## 2016-05-24 LAB — BASIC METABOLIC PANEL
ANION GAP: 8 (ref 5–15)
BUN: 11 mg/dL (ref 6–20)
CO2: 23 mmol/L (ref 22–32)
Calcium: 9.5 mg/dL (ref 8.9–10.3)
Chloride: 105 mmol/L (ref 101–111)
Creatinine, Ser: 0.92 mg/dL (ref 0.44–1.00)
GFR calc Af Amer: >60 mL/min (ref >60)
GLUCOSE: 145 mg/dL — AB (ref 65–99)
POTASSIUM: 3.3 mmol/L — AB (ref 3.5–5.1)
Sodium: 136 mmol/L (ref 135–145)

## 2016-05-24 LAB — TROPONIN I: Troponin I: 0.1 ng/mL

## 2016-05-24 LAB — MAGNESIUM: Magnesium: 2.2 mg/dL (ref 1.7–2.4)

## 2016-05-24 MED ORDER — REGADENOSON 0.4 MG/5ML IV SOLN
INTRAVENOUS | Status: AC
  Administered 2016-05-24: 0.4 mg
  Filled 2016-05-24: qty 5

## 2016-05-24 MED ORDER — TECHNETIUM TC 99M TETROFOSMIN IV KIT
10.0000 | PACK | Freq: Once | INTRAVENOUS | Status: AC | PRN
  Administered 2016-05-24: 10 via INTRAVENOUS

## 2016-05-24 MED ORDER — ALPRAZOLAM 0.25 MG PO TABS
ORAL_TABLET | ORAL | Status: AC
  Filled 2016-05-24: qty 2

## 2016-05-24 MED ORDER — POTASSIUM CHLORIDE CRYS ER 20 MEQ PO TBCR
50.0000 meq | EXTENDED_RELEASE_TABLET | Freq: Two times a day (BID) | ORAL | Status: AC
  Administered 2016-05-24 (×2): 50 meq via ORAL
  Filled 2016-05-24 (×2): qty 2

## 2016-05-24 MED ORDER — TECHNETIUM TC 99M TETROFOSMIN IV KIT
30.0000 | PACK | Freq: Once | INTRAVENOUS | Status: AC | PRN
  Administered 2016-05-24: 30 via INTRAVENOUS

## 2016-05-24 NOTE — Progress Notes (Signed)
Consult note from Dr Claiborne Billings reviewed, she is awaiting nuclear stress test today for preoperative risk stratification. We will f/u results and make final preop recs based on findings. No additional cardiac recs today, call with questions.    Zandra Abts MD

## 2016-05-24 NOTE — Progress Notes (Signed)
   Kristi Kramer presented for a 1-day cardiolite today.  No immediate complications.  Stress imaging is pending at this time.  Erma Heritage, PA-C 05/24/2016, 10:38 AM

## 2016-05-24 NOTE — Progress Notes (Signed)
Pt has home CPAP that she places herself on at night.

## 2016-05-24 NOTE — Progress Notes (Signed)
Advance directives with POA and HCPOA designation completed by [facility] 05/23/2016. Pt currently asleep with CPAP. Observed to still have apnea wearing CPAP. Going for tests today. Will attempt to FU later today to further address GOC with pt Romona Curls NP

## 2016-05-24 NOTE — Progress Notes (Signed)
Stress test results reviewed. Suggests possible anterior infarct. Possible inferior ischemia, LVEF 47%. We will order echo limited to evaluate LVEF and wall motion. If no significant WMAs would think the anterior defect may be an artifact. Mild inferior ischemia would not be prohibitive for surgery.    Zandra Abts MD

## 2016-05-24 NOTE — Progress Notes (Signed)
PROGRESS NOTE    Kristi Kramer  NUU:[id number] DOB: Mar 28, 1946 DOA: 05/18/2016 PCP: Cyndi Bender, PA-C   Brief Narrative:  Kristi Kramer is a 70 y.o.WF PMHx  Anxiety, CVA with left-sided weakness, HTN, DM Type 2 uncontrolled with complications, COPD, OSA,   Brought to the ER after patient had a witnessed seizure. Patient's boyfriend saw the patient had tonic-clonic seizure. EMS was called and after EMS arrival patient had another tonic-clonic seizure and was given Midazolam. Patient was postictal in the ER on arrival. CT head is unremarkable. Neurologist was consulted and patient was placed on loading dose of Keppra. After neurological evaluation patient was placed on increased dose of Topamax which patient usually takes for headaches. On exam patient is still post ictal but follows commands and moves all extremities. Pupils are reacting. No further neurological exam possible since patient is still postictal.    Subjective: 11/4  A/O 4,Kristi Kramer  present at bedside, for discussion of plan of care.Negative CP, negative SOB, negative HA    Assessment & Plan:   Principal Problem:   Seizures ([place]) Active Problems:   Type 2 diabetes mellitus (HCC)   Essential hypertension   OSA (obstructive sleep apnea)   Seizure (HCC)   Abnormal MRI   Polypharmacy   Controlled diabetes mellitus type 2 with complications ([place])   Palliative care encounter   Elevated troponin   Preoperative clearance  Seizures -EEG: Abnormal could be consistent with structural abnormality see results below  Brain metastases/Primary Brain Lymphoma -Received call from [facility]: Believe they may see metastases/masses on normal brain MRI.  -contrast MRI. 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 Newman Pies Neurosurgery has seen previously March 2017 -CT Chest, Abdomen, Pelvis: Negative for neoplasm  -11/3 although patient's  initial increased troponin most likely secondary to seizure, Neurosurgery has requested that cardiology clear patient prior surgical brain biopsy Monday.  Neoplasm workup -CT abdomen, pelvis, chest negative neoplasm. See results below  Thoracic compression fracture -Chronic/age indeterminate fracture see results below.  Chest pain -Reproducible left thoracic rib pain consistent with patient's reported fall.  Stroke (history of) -CT scan showed old strokes see results below  OSA -CPAP per respiratory therapy  COPD -Currently asymptomatic monitor closely  HTN -Hydralazine PRN  Elevated troponins -Most likely secondary to seizures -Will trend per neurosurgery request. Cardiology consult placed per Neurosurgery request.   Possible UTI -UA not convincing for true UTI and is more suggestive of dirty collection - complete 3 days of abx only unless culture more revealing   Diabetes type 2 controlled with, complications -Sensitive SSI  Hypokalemia -K-Dur 50 mEq 2 doses  Hypomagnesemia -Magnesium IV 2 g     Goals of care  -Obtain Palliative Care Consult: Patient and significant other (Kristi Kramer) would like to speak with member of palliative care team to discuss appointing primary and secondary HCPOA, CODE STATUS, short-term and long-term goals of care.    DVT prophylaxis: Lovenox Code Status: Full Family Communication:   Family present for discussion of plan of care Disposition Plan: Per neurosurgery   Consultants:  Dr.Karen Jacquen[facility] Dr. Newman Pies Neurosurgery pending    Procedures/Significant Events:  10/30 CT head WO contrast:-No acute intracranial pathology. - Small chronic infarcts at the high parietal lobes bilaterally. EEG - 10/30 - focal slowing over L hemisphere - frequent broad sharp waves over L frontopolar region  10/31 brain MRI:-New cerebral signal abnormality and swelling centered on the genu  of the corpus callosum. Edematous change  also present in the right cerebellum. DDX: Tumefactive demyelination, CNS lymphoma, or atypical infection are primary considerations. 11/1 MRI Brain w/wo Contrast; -Moderate to severe abnormal enhancement and signal abnormality within infiltrative appearance in both inferior frontal gyri, the genu of the corpus callosum, and separately in the right cerebellum. --Nonenhancing T2 signal abnormality extending into the right hypothalamus. Mild associated regional mass effect.  -Evolution and/or regression of signal abnormality in the left parietal and superior right frontal lobes since January 2017 and March 2016, respectively.  -Small foci of cortical encephalomalacia developed at both areas. 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  -Other top differential diagnosis are Atypical CNS Infection and Neurosarcoidosis.  11/2 CT chest, abdomen, pelvis W contrast:CT CHEST:   negative finding of neoplasm. -Old multiple mild and mild to moderate age indeterminate upper thoracic compression fractures. -Diverticulosis without acute diverticulitis.     Cultures 10/30 MRSA by PCR negative 10/31 urine  pending   Antimicrobials: Rocephin 10/29 > 11/1   Devices    LINES / TUBES:      Continuous Infusions:    Objective: Vitals:   05/24/16 0100 05/24/16 0200 05/24/16 0454 05/24/16 0800  BP: 115/81 112/65 112/78 138/73  Pulse: 70 64 80 66  Resp: '18 20 18 16  '$ Temp:   99.6 F (37.6 C) 98 F (36.7 C)  TempSrc:   Oral Oral  SpO2: 97% 96% 100% 98%  Weight:      Height:        Intake/Output Summary (Last 24 hours) at 05/24/16 0935 Last data filed at 05/24/16 0800  Gross per 24 hour  Intake              240 ml  Output             1500 ml  Net            -1260 ml   Filed Weights   05/19/16 0958  Weight: 94.3 kg (208 lb)    Examination:  General: A/O 4, No acute respiratory distress Eyes: negative scleral hemorrhage, negative anisocoria, negative  icterus ENT: Negative Runny nose, negative gingival bleeding, Neck:  Negative scars, masses, torticollis, lymphadenopathy, JVD Lungs: Clear to auscultation bilaterally without wheezes or crackles Cardiovascular: Regular rate and rhythm without murmur gallop or rub normal S1 and S2, left lateral thoracic tenderness to palpation (consistent with patient's report of fall) Abdomen: negative abdominal pain, nondistended, positive soft, bowel sounds, no rebound, no ascites, no appreciable mass Extremities: No significant cyanosis, clubbing, or edema bilateral lower extremities Skin: Negative rashes, lesions, ulcers Psychiatric:  Negative depression, negative anxiety, negative fatigue, negative mania  Central nervous system:  Cranial nerves II through XII intact, tongue/uvula midline, all extremities muscle strength 5/5, sensation intact throughout,negative dysarthria, negative expressive aphasia, negative receptive aphasia.  .     Data Reviewed: Care during the described time interval was provided by me .  I have reviewed this patient's available data, including medical history, events of note, physical examination, and all test results as part of my evaluation. I have personally reviewed and interpreted all radiology studies.  CBC:  Recent Labs Lab 05/19/16 0014 05/19/16 0538 05/20/16 0650 05/22/16 0255  WBC 13.9* 13.4* 7.1 7.8  NEUTROABS  --  11.1*  --   --   HGB 14.4 12.9 11.9* 12.5  HCT 42.7 37.0 35.2* 36.2  MCV 88.8 84.3 86.3 84.0  PLT 274 237 226 240   Basic  Metabolic Panel:  Recent Labs Lab 05/19/16 0310 05/19/16 0538 05/20/16 0650 05/22/16 0255 05/23/16 1305 05/24/16 0021  NA  --  139 140 136 135 136  K  --  2.9* 3.3* 2.8* 3.1* 3.3*  CL  --  103 108 102 103 105  CO2  --  '27 24 25 23 23  '$ GLUCOSE  --  134* 122* 132* 131* 145*  BUN  --  '19 16 8 10 11  '$ CREATININE  --  0.91 0.88 0.86 0.81 0.92  CALCIUM  --  9.2 9.1 9.3 9.6 9.5  MG 1.9  --   --   --  1.6* 2.2    GFR: Estimated Creatinine Clearance: 66.4 mL/min (by C-G formula based on SCr of 0.92 mg/dL). Liver Function Tests:  Recent Labs Lab 05/19/16 0051 05/19/16 0538 05/20/16 0650  AST '22 22 29  '$ ALT 13* 13* 13*  ALKPHOS 79 78 69  BILITOT 0.4 0.6 0.9  PROT 6.8 6.4* 5.9*  ALBUMIN 3.6 3.5 3.1*   No results for input(s): LIPASE, AMYLASE in the last 168 hours. No results for input(s): AMMONIA in the last 168 hours. Coagulation Profile:  Recent Labs Lab 05/22/16 0255  INR 1.08   Cardiac Enzymes:  Recent Labs Lab 05/19/16 0031  05/19/16 2350 05/20/16 0650 05/23/16 1305 05/23/16 1828 05/24/16 0021  CKTOTAL 52  --   --   --   --   --   --   CKMB 2.1  --   --   --   --   --   --   TROPONINI  --   < > 1.61* 1.25* 0.11* 0.10* 0.10*  < > = values in this interval not displayed. BNP (last 3 results) No results for input(s): PROBNP in the last 8760 hours. HbA1C: No results for input(s): HGBA1C in the last 72 hours. CBG:  Recent Labs Lab 05/23/16 0740 05/23/16 1231 05/23/16 1549 05/23/16 1928 05/24/16 0838  GLUCAP 117* 151* 115* 173* 116*   Lipid Profile: No results for input(s): CHOL, HDL, LDLCALC, TRIG, CHOLHDL, LDLDIRECT in the last 72 hours. Thyroid Function Tests: No results for input(s): TSH, T4TOTAL, FREET4, T3FREE, THYROIDAB in the last 72 hours. Anemia Panel: No results for input(s): VITAMINB12, FOLATE, FERRITIN, TIBC, IRON, RETICCTPCT in the last 72 hours. Urine analysis:    Component Value Date/Time   COLORURINE YELLOW 05/19/2016 0245   APPEARANCEUR CLOUDY (A) 05/19/2016 0245   LABSPEC 1.019 05/19/2016 0245   PHURINE 6.0 05/19/2016 0245   GLUCOSEU NEGATIVE 05/19/2016 0245   HGBUR SMALL (A) 05/19/2016 0245   BILIRUBINUR NEGATIVE 05/19/2016 0245   KETONESUR NEGATIVE 05/19/2016 0245   PROTEINUR NEGATIVE 05/19/2016 0245   UROBILINOGEN 0.2 05/31/2015 1420   NITRITE POSITIVE (A) 05/19/2016 0245   LEUKOCYTESUR NEGATIVE 05/19/2016 0245   Sepsis  Labs: '@LABRCNTIP'$ (procalcitonin:4,lacticidven:4)  ) Recent Results (from the past 240 hour(s))  Culture, Urine     Status: Abnormal   Collection Time: 05/19/16  2:45 AM  Result Value Ref Range Status   Specimen Description URINE, RANDOM  Final   Special Requests NONE  Final   Culture (A)  Final    >=100,000 COLONIES/mL ESCHERICHIA COLI Confirmed Extended Spectrum Beta-Lactamase Producer (ESBL)    Report Status 05/23/2016 FINAL  Final   Organism ID, Bacteria ESCHERICHIA COLI (A)  Final      Susceptibility   Escherichia coli - MIC*    AMPICILLIN >=32 RESISTANT Resistant     CEFAZOLIN >=64 RESISTANT Resistant     CEFTRIAXONE >=64  RESISTANT Resistant     CIPROFLOXACIN >=4 RESISTANT Resistant     GENTAMICIN <=1 SENSITIVE Sensitive     IMIPENEM <=0.25 SENSITIVE Sensitive     NITROFURANTOIN >=512 RESISTANT Resistant     TRIMETH/SULFA <=20 SENSITIVE Sensitive     AMPICILLIN/SULBACTAM >=32 RESISTANT Resistant     PIP/TAZO <=4 SENSITIVE Sensitive     Extended ESBL POSITIVE Resistant     * >=100,000 COLONIES/mL ESCHERICHIA COLI  MRSA PCR Screening     Status: None   Collection Time: 05/19/16  9:52 AM  Result Value Ref Range Status   MRSA by PCR NEGATIVE NEGATIVE Final    Comment:        The GeneXpert MRSA Assay (FDA approved for NASAL specimens only), is one component of a comprehensive MRSA colonization surveillance program. It is not intended to diagnose MRSA infection nor to guide or monitor treatment for MRSA infections.   Culture, Urine     Status: Abnormal   Collection Time: 05/20/16 11:08 PM  Result Value Ref Range Status   Specimen Description URINE, RANDOM  Final   Special Requests NONE  Final   Culture (A)  Final    10,000 COLONIES/mL ESCHERICHIA COLI Confirmed Extended Spectrum Beta-Lactamase Producer (ESBL)    Report Status 05/23/2016 FINAL  Final   Organism ID, Bacteria ESCHERICHIA COLI (A)  Final      Susceptibility   Escherichia coli - MIC*    AMPICILLIN  >=32 RESISTANT Resistant     CEFAZOLIN >=64 RESISTANT Resistant     CEFTRIAXONE >=64 RESISTANT Resistant     CIPROFLOXACIN >=4 RESISTANT Resistant     GENTAMICIN <=1 SENSITIVE Sensitive     IMIPENEM <=0.25 SENSITIVE Sensitive     NITROFURANTOIN >=512 RESISTANT Resistant     TRIMETH/SULFA <=20 SENSITIVE Sensitive     AMPICILLIN/SULBACTAM >=32 RESISTANT Resistant     PIP/TAZO <=4 SENSITIVE Sensitive     Extended ESBL POSITIVE Resistant     * 10,000 COLONIES/mL ESCHERICHIA COLI         Radiology Studies: Ct Chest W Contrast  Result Date: 05/22/2016 CLINICAL DATA:  History of waxing and waning abnormal intracranial enhancement, suspect brain tumor, possible metastasis. History of hypertension, diabetes, stroke. History of cholecystectomy, appendectomy and hysterectomy. EXAM: CT CHEST, ABDOMEN, AND PELVIS WITH CONTRAST TECHNIQUE: Multidetector CT imaging of the chest, abdomen and pelvis was performed following the standard protocol during bolus administration of intravenous contrast. CONTRAST:  100 cc ISOVUE-300 IOPAMIDOL (ISOVUE-300) INJECTION 61% COMPARISON:  MRI head May 21, 2016 and multiple priors. CT abdomen and pelvis May 29, 2015. FINDINGS: CT CHEST FINDINGS CARDIOVASCULAR: Heart size is normal. Mild coronary artery calcifications. Mild pericardial wall thickening. Thoracic aorta is normal course and caliber, mild calcific atherosclerosis. MEDIASTINUM/NODES: No mediastinal mass. No lymphadenopathy by CT size criteria. Small calcified LEFT hilar lymph nodes. Normal appearance of thoracic esophagus though not tailored for evaluation. Increasingly prominent chylous duct at the crus of the diaphragm. LUNGS/PLEURA: Tracheobronchial tree is patent, no pneumothorax. Scattered calcified granulomas. Enhancing dependent atelectasis. Trace pleural effusions. No focal consolidation, nodules or masses. MUSCULOSKELETAL: Mild T3, mild to moderate T4 through T6 and mild T7 age indeterminate  compression fractures. No destructive bony lesions. ACDF. Mildly widened RIGHT acromioclavicular joint space most compatible with old injury. CT ABDOMEN AND PELVIS FINDINGS HEPATOBILIARY: Liver is normal.  Status post cholecystectomy. PANCREAS: Normal. SPLEEN: Normal. ADRENALS/URINARY TRACT: Kidneys are orthotopic, demonstrating symmetric enhancement. Too small to characterize RIGHT interpolar hypodensity. 1 cm LEFT lower pole and RIGHT interpolar cysts.  No nephrolithiasis, hydronephrosis or solid renal masses. The unopacified ureters are normal in course and caliber. Delayed imaging through the kidneys demonstrates symmetric prompt contrast excretion within the proximal urinary collecting system. Urinary bladder is partially distended and unremarkable. Normal adrenal glands. STOMACH/BOWEL: The stomach, small and large bowel are normal in course and caliber without inflammatory changes. Moderate descending and sigmoid diverticulosis. Mild amount of retained large bowel stool. VASCULAR/LYMPHATIC: Aortoiliac vessels are normal in course and caliber, moderate calcific atherosclerosis. No lymphadenopathy by CT size criteria. REPRODUCTIVE: Status post hysterectomy. OTHER: No intraperitoneal free fluid or free air. MUSCULOSKELETAL: Non-acute. Gluteal injection granulomas. Anterior abdominal wall ligamentous laxity. L2 benign hemangioma. Mild lower lumbar facet arthropathy. Anterior pelvic wall scarring. IMPRESSION: CT CHEST:  No CT findings of neoplasm in the chest. Trace pleural effusions and bibasilar atelectasis. Old granulomatous disease. Old multiple mild and mild to moderate age indeterminate upper thoracic compression fractures. CT ABDOMEN AND PELVIS: No CT findings of neoplasm in the abdomen or pelvis. Diverticulosis without acute diverticulitis. Status post cholecystectomy, appendectomy and hysterectomy. Electronically Signed   By: Elon Alas M.D.   On: 05/22/2016 14:17   Ct Abdomen Pelvis W  Contrast  Result Date: 05/22/2016 CLINICAL DATA:  History of waxing and waning abnormal intracranial enhancement, suspect brain tumor, possible metastasis. History of hypertension, diabetes, stroke. History of cholecystectomy, appendectomy and hysterectomy. EXAM: CT CHEST, ABDOMEN, AND PELVIS WITH CONTRAST TECHNIQUE: Multidetector CT imaging of the chest, abdomen and pelvis was performed following the standard protocol during bolus administration of intravenous contrast. CONTRAST:  100 cc ISOVUE-300 IOPAMIDOL (ISOVUE-300) INJECTION 61% COMPARISON:  MRI head May 21, 2016 and multiple priors. CT abdomen and pelvis May 29, 2015. FINDINGS: CT CHEST FINDINGS CARDIOVASCULAR: Heart size is normal. Mild coronary artery calcifications. Mild pericardial wall thickening. Thoracic aorta is normal course and caliber, mild calcific atherosclerosis. MEDIASTINUM/NODES: No mediastinal mass. No lymphadenopathy by CT size criteria. Small calcified LEFT hilar lymph nodes. Normal appearance of thoracic esophagus though not tailored for evaluation. Increasingly prominent chylous duct at the crus of the diaphragm. LUNGS/PLEURA: Tracheobronchial tree is patent, no pneumothorax. Scattered calcified granulomas. Enhancing dependent atelectasis. Trace pleural effusions. No focal consolidation, nodules or masses. MUSCULOSKELETAL: Mild T3, mild to moderate T4 through T6 and mild T7 age indeterminate compression fractures. No destructive bony lesions. ACDF. Mildly widened RIGHT acromioclavicular joint space most compatible with old injury. CT ABDOMEN AND PELVIS FINDINGS HEPATOBILIARY: Liver is normal.  Status post cholecystectomy. PANCREAS: Normal. SPLEEN: Normal. ADRENALS/URINARY TRACT: Kidneys are orthotopic, demonstrating symmetric enhancement. Too small to characterize RIGHT interpolar hypodensity. 1 cm LEFT lower pole and RIGHT interpolar cysts. No nephrolithiasis, hydronephrosis or solid renal masses. The unopacified ureters are  normal in course and caliber. Delayed imaging through the kidneys demonstrates symmetric prompt contrast excretion within the proximal urinary collecting system. Urinary bladder is partially distended and unremarkable. Normal adrenal glands. STOMACH/BOWEL: The stomach, small and large bowel are normal in course and caliber without inflammatory changes. Moderate descending and sigmoid diverticulosis. Mild amount of retained large bowel stool. VASCULAR/LYMPHATIC: Aortoiliac vessels are normal in course and caliber, moderate calcific atherosclerosis. No lymphadenopathy by CT size criteria. REPRODUCTIVE: Status post hysterectomy. OTHER: No intraperitoneal free fluid or free air. MUSCULOSKELETAL: Non-acute. Gluteal injection granulomas. Anterior abdominal wall ligamentous laxity. L2 benign hemangioma. Mild lower lumbar facet arthropathy. Anterior pelvic wall scarring. IMPRESSION: CT CHEST:  No CT findings of neoplasm in the chest. Trace pleural effusions and bibasilar atelectasis. Old granulomatous disease. Old multiple mild and mild to moderate age indeterminate  upper thoracic compression fractures. CT ABDOMEN AND PELVIS: No CT findings of neoplasm in the abdomen or pelvis. Diverticulosis without acute diverticulitis. Status post cholecystectomy, appendectomy and hysterectomy. Electronically Signed   By: Elon Alas M.D.   On: 05/22/2016 14:17        Scheduled Meds: . atorvastatin  20 mg Oral q1800  . carvedilol  3.125 mg Oral BID WC  . carvedilol  3.125 mg Oral BID WC  . citalopram  20 mg Oral Daily  . enoxaparin (LOVENOX) injection  40 mg Subcutaneous Q24H  . fluticasone  1-2 spray Each Nare BID  . fosfomycin  3 g Oral Q48H  . insulin aspart  0-9 Units Subcutaneous TID WC  . levETIRAcetam  500 mg Oral BID  . lisinopril  2.5 mg Oral Daily  . pantoprazole  40 mg Oral Daily  . topiramate  50 mg Oral BID   Continuous Infusions:    LOS: 5 days    Time spent: 40 minutes    Solstice Lastinger, Geraldo Docker, MD [facility] Pager [phone]   If 7PM-7AM, please contact night-coverage [email or web address] Password [email or web address] 05/24/2016, 9:35 AM

## 2016-05-24 NOTE — Progress Notes (Signed)
Subjective: No further seizures.  Objective: Vital signs in last 24 hours: Temp:  [98 F (36.7 C)-99.6 F (37.6 C)] 98 F (36.7 C) (11/04 0800) Pulse Rate:  [64-105] 66 (11/04 0800) Resp:  [11-24] 16 (11/04 0800) BP: (93-152)/(62-97) 138/73 (11/04 0800) SpO2:  [94 %-100 %] 98 % (11/04 0800)  Intake/Output from previous day: 11/03 0701 - 11/04 0700 In: 240 [P.O.:240] Out: 1400 [Urine:1400] Intake/Output this shift: Total I/O In: 0  Out: 100 [Urine:100] Nutritional status: Diet NPO time specified  Neurologic Exam: Awake, alert, non-focal.  Lab Results:  Recent Labs  05/22/16 0255 05/23/16 1305 05/24/16 0021  WBC 7.8  --   --   HGB 12.5  --   --   HCT 36.2  --   --   PLT 240  --   --   NA 136 135 136  K 2.8* 3.1* 3.3*  CL 102 103 105  CO2 '25 23 23  '$ GLUCOSE 132* 131* 145*  BUN '8 10 11  '$ CREATININE 0.86 0.81 0.92  CALCIUM 9.3 9.6 9.5   Lipid Panel No results for input(s): CHOL, TRIG, HDL, CHOLHDL, VLDL, LDLCALC in the last 72 hours.  Studies/Results: Ct Chest W Contrast  Result Date: 05/22/2016 CLINICAL DATA:  History of waxing and waning abnormal intracranial enhancement, suspect brain tumor, possible metastasis. History of hypertension, diabetes, stroke. History of cholecystectomy, appendectomy and hysterectomy. EXAM: CT CHEST, ABDOMEN, AND PELVIS WITH CONTRAST TECHNIQUE: Multidetector CT imaging of the chest, abdomen and pelvis was performed following the standard protocol during bolus administration of intravenous contrast. CONTRAST:  100 cc ISOVUE-300 IOPAMIDOL (ISOVUE-300) INJECTION 61% COMPARISON:  MRI head May 21, 2016 and multiple priors. CT abdomen and pelvis May 29, 2015. FINDINGS: CT CHEST FINDINGS CARDIOVASCULAR: Heart size is normal. Mild coronary artery calcifications. Mild pericardial wall thickening. Thoracic aorta is normal course and caliber, mild calcific atherosclerosis. MEDIASTINUM/NODES: No mediastinal mass. No lymphadenopathy by CT size  criteria. Small calcified LEFT hilar lymph nodes. Normal appearance of thoracic esophagus though not tailored for evaluation. Increasingly prominent chylous duct at the crus of the diaphragm. LUNGS/PLEURA: Tracheobronchial tree is patent, no pneumothorax. Scattered calcified granulomas. Enhancing dependent atelectasis. Trace pleural effusions. No focal consolidation, nodules or masses. MUSCULOSKELETAL: Mild T3, mild to moderate T4 through T6 and mild T7 age indeterminate compression fractures. No destructive bony lesions. ACDF. Mildly widened RIGHT acromioclavicular joint space most compatible with old injury. CT ABDOMEN AND PELVIS FINDINGS HEPATOBILIARY: Liver is normal.  Status post cholecystectomy. PANCREAS: Normal. SPLEEN: Normal. ADRENALS/URINARY TRACT: Kidneys are orthotopic, demonstrating symmetric enhancement. Too small to characterize RIGHT interpolar hypodensity. 1 cm LEFT lower pole and RIGHT interpolar cysts. No nephrolithiasis, hydronephrosis or solid renal masses. The unopacified ureters are normal in course and caliber. Delayed imaging through the kidneys demonstrates symmetric prompt contrast excretion within the proximal urinary collecting system. Urinary bladder is partially distended and unremarkable. Normal adrenal glands. STOMACH/BOWEL: The stomach, small and large bowel are normal in course and caliber without inflammatory changes. Moderate descending and sigmoid diverticulosis. Mild amount of retained large bowel stool. VASCULAR/LYMPHATIC: Aortoiliac vessels are normal in course and caliber, moderate calcific atherosclerosis. No lymphadenopathy by CT size criteria. REPRODUCTIVE: Status post hysterectomy. OTHER: No intraperitoneal free fluid or free air. MUSCULOSKELETAL: Non-acute. Gluteal injection granulomas. Anterior abdominal wall ligamentous laxity. L2 benign hemangioma. Mild lower lumbar facet arthropathy. Anterior pelvic wall scarring. IMPRESSION: CT CHEST:  No CT findings of neoplasm in  the chest. Trace pleural effusions and bibasilar atelectasis. Old granulomatous disease. Old multiple mild  and mild to moderate age indeterminate upper thoracic compression fractures. CT ABDOMEN AND PELVIS: No CT findings of neoplasm in the abdomen or pelvis. Diverticulosis without acute diverticulitis. Status post cholecystectomy, appendectomy and hysterectomy. Electronically Signed   By: Elon Alas M.D.   On: 05/22/2016 14:17   Ct Abdomen Pelvis W Contrast  Result Date: 05/22/2016 CLINICAL DATA:  History of waxing and waning abnormal intracranial enhancement, suspect brain tumor, possible metastasis. History of hypertension, diabetes, stroke. History of cholecystectomy, appendectomy and hysterectomy. EXAM: CT CHEST, ABDOMEN, AND PELVIS WITH CONTRAST TECHNIQUE: Multidetector CT imaging of the chest, abdomen and pelvis was performed following the standard protocol during bolus administration of intravenous contrast. CONTRAST:  100 cc ISOVUE-300 IOPAMIDOL (ISOVUE-300) INJECTION 61% COMPARISON:  MRI head May 21, 2016 and multiple priors. CT abdomen and pelvis May 29, 2015. FINDINGS: CT CHEST FINDINGS CARDIOVASCULAR: Heart size is normal. Mild coronary artery calcifications. Mild pericardial wall thickening. Thoracic aorta is normal course and caliber, mild calcific atherosclerosis. MEDIASTINUM/NODES: No mediastinal mass. No lymphadenopathy by CT size criteria. Small calcified LEFT hilar lymph nodes. Normal appearance of thoracic esophagus though not tailored for evaluation. Increasingly prominent chylous duct at the crus of the diaphragm. LUNGS/PLEURA: Tracheobronchial tree is patent, no pneumothorax. Scattered calcified granulomas. Enhancing dependent atelectasis. Trace pleural effusions. No focal consolidation, nodules or masses. MUSCULOSKELETAL: Mild T3, mild to moderate T4 through T6 and mild T7 age indeterminate compression fractures. No destructive bony lesions. ACDF. Mildly widened RIGHT  acromioclavicular joint space most compatible with old injury. CT ABDOMEN AND PELVIS FINDINGS HEPATOBILIARY: Liver is normal.  Status post cholecystectomy. PANCREAS: Normal. SPLEEN: Normal. ADRENALS/URINARY TRACT: Kidneys are orthotopic, demonstrating symmetric enhancement. Too small to characterize RIGHT interpolar hypodensity. 1 cm LEFT lower pole and RIGHT interpolar cysts. No nephrolithiasis, hydronephrosis or solid renal masses. The unopacified ureters are normal in course and caliber. Delayed imaging through the kidneys demonstrates symmetric prompt contrast excretion within the proximal urinary collecting system. Urinary bladder is partially distended and unremarkable. Normal adrenal glands. STOMACH/BOWEL: The stomach, small and large bowel are normal in course and caliber without inflammatory changes. Moderate descending and sigmoid diverticulosis. Mild amount of retained large bowel stool. VASCULAR/LYMPHATIC: Aortoiliac vessels are normal in course and caliber, moderate calcific atherosclerosis. No lymphadenopathy by CT size criteria. REPRODUCTIVE: Status post hysterectomy. OTHER: No intraperitoneal free fluid or free air. MUSCULOSKELETAL: Non-acute. Gluteal injection granulomas. Anterior abdominal wall ligamentous laxity. L2 benign hemangioma. Mild lower lumbar facet arthropathy. Anterior pelvic wall scarring. IMPRESSION: CT CHEST:  No CT findings of neoplasm in the chest. Trace pleural effusions and bibasilar atelectasis. Old granulomatous disease. Old multiple mild and mild to moderate age indeterminate upper thoracic compression fractures. CT ABDOMEN AND PELVIS: No CT findings of neoplasm in the abdomen or pelvis. Diverticulosis without acute diverticulitis. Status post cholecystectomy, appendectomy and hysterectomy. Electronically Signed   By: Elon Alas M.D.   On: 05/22/2016 14:17    Medications:  Scheduled: . atorvastatin  20 mg Oral q1800  . carvedilol  3.125 mg Oral BID WC  .  carvedilol  3.125 mg Oral BID WC  . citalopram  20 mg Oral Daily  . enoxaparin (LOVENOX) injection  40 mg Subcutaneous Q24H  . fluticasone  1-2 spray Each Nare BID  . fosfomycin  3 g Oral Q48H  . insulin aspart  0-9 Units Subcutaneous TID WC  . levETIRAcetam  500 mg Oral BID  . lisinopril  2.5 mg Oral Daily  . pantoprazole  40 mg Oral Daily  . 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  The 2 major differentials are primary CNS lymphoma and Neurosarcoidosis.  CT chest, abdomen, pelvis is negative so mets less likely.  Although multifocal infection is possible, it is less likely since afebrile with normal WBC.  Awaiting neurosurgery biopsy of lesions.  Cont both AEDs at same doses.   LOS: 5 days   Rogue Jury, MD 05/24/2016  9:25 AM

## 2016-05-25 ENCOUNTER — Inpatient Hospital Stay (HOSPITAL_COMMUNITY): Payer: Medicare Other

## 2016-05-25 DIAGNOSIS — R0600 Dyspnea, unspecified: Secondary | ICD-10-CM

## 2016-05-25 DIAGNOSIS — A499 Bacterial infection, unspecified: Secondary | ICD-10-CM

## 2016-05-25 DIAGNOSIS — Z1612 Extended spectrum beta lactamase (ESBL) resistance: Secondary | ICD-10-CM

## 2016-05-25 LAB — BASIC METABOLIC PANEL
Anion gap: 7 (ref 5–15)
BUN: 14 mg/dL (ref 6–20)
CHLORIDE: 109 mmol/L (ref 101–111)
CO2: 23 mmol/L (ref 22–32)
CREATININE: 1.04 mg/dL — AB (ref 0.44–1.00)
Calcium: 9.6 mg/dL (ref 8.9–10.3)
GFR calc Af Amer: >60 mL/min (ref >60)
GFR calc non Af Amer: 54 mL/min — ABNORMAL LOW (ref >60)
GLUCOSE: 133 mg/dL — AB (ref 65–99)
POTASSIUM: 4 mmol/L (ref 3.5–5.1)
Sodium: 139 mmol/L (ref 135–145)

## 2016-05-25 LAB — ECHOCARDIOGRAM LIMITED
CHL CUP MV DEC (S): 144
CHL CUP STROKE VOLUME: 40 mL
E/e' ratio: 8.46
EWDT: 144 ms
FS: 18 % — AB (ref 28–44)
HEIGHTINCHES: 65 in
IV/PV OW: 1.22
LA diam end sys: 28 mm
LADIAMINDEX: 1.39 cm/m2
LASIZE: 28 mm
LV PW d: 8.31 mm — AB (ref 0.6–1.1)
LV TDI E'LATERAL: 7.62
LV sys vol: 45 mL — AB (ref 14–42)
LVDIAVOL: 85 mL (ref 46–106)
LVDIAVOLIN: 42 mL/m2
LVEEAVG: 8.46
LVEEMED: 8.46
LVELAT: 7.62 cm/s
LVOT area: 3.46 cm2
LVOT diameter: 21 mm
LVSYSVOLIN: 22 mL/m2
MV pk A vel: 86.8 m/s
MV pk E vel: 64.5 m/s
Simpson's disk: 47
TDI e' medial: 5.11
WEIGHTICAEL: 3328 [oz_av]

## 2016-05-25 LAB — GLUCOSE, CAPILLARY
Glucose-Capillary: 133 mg/dL — ABNORMAL HIGH (ref 65–99)
Glucose-Capillary: 119 mg/dL — ABNORMAL HIGH (ref 65–99)
Glucose-Capillary: 141 mg/dL — ABNORMAL HIGH (ref 65–99)
Glucose-Capillary: 177 mg/dL — ABNORMAL HIGH (ref 65–99)

## 2016-05-25 LAB — MAGNESIUM: Magnesium: 2 mg/dL (ref 1.7–2.4)

## 2016-05-25 NOTE — Progress Notes (Signed)
Echo completed, shows LVEF 40-45% with hypokinesis of portions of the anteroseptal, anterior and apical walls consistent with the scar seen on nuclear imaging. Appears she had a prior infarct in the past, though this pattern can also be seen in [facility] CM  Overall difficult situation. Admitted with seizures and found to have brain lesions concerning for possible malignancy. 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 There is only mild amount of myocardium currently at jeopardy. Note such a nuclear imaging defect and WMAs can be seen in [facility] CM as well. She was admitted with elevated troponin in setting of seizures. She has atypical positional chest pain. She requires neurosurgical biopsy of these brain lesions. She would not be a candidate for PCI given her brain lesions and need for DAPT, we would not delay her biopsy the month that would be required if a stent was placed. In essence with all things considered she is at increased but not prohibitive cardiac risk for procedure, we do not have any significant options to lower this risk other than medical therapy at this time. Would recommend proceeding with biopsy as planned, continue beta blocker   Zandra Abts MD

## 2016-05-25 NOTE — Progress Notes (Signed)
Subjective:    Atypical chest pain  Objective:   Temp:  [97.5 F (36.4 C)-98.6 F (37 C)] 98.6 F (37 C) (11/05 0300) Pulse Rate:  [66-107] 86 (11/05 0751) Resp:  [18-19] 19 (11/05 0000) BP: (102-172)/(60-86) 126/63 (11/05 0751) SpO2:  [95 %-98 %] 98 % (11/05 0300) Last BM Date: 05/23/16  Filed Weights   05/19/16 0958  Weight: 208 lb (94.3 kg)    Intake/Output Summary (Last 24 hours) at 05/25/16 0816 Last data filed at 05/25/16 0300  Gross per 24 hour  Intake              240 ml  Output                0 ml  Net              240 ml    Telemetry: SR  Exam:  General: NAD  HEENT: sclera clear, throat clear  Resp: CTAB  Cardiac: RRR, no m/r/g, no jvd  GI: abdomen soft, NT, ND  MSK: no LE edema  Neuro: no focal deficits  Psych: appropriate affect  Lab Results:  Basic Metabolic Panel:  Recent Labs Lab 05/23/16 1305 05/24/16 0021 05/25/16 0141  NA 135 136 139  K 3.1* 3.3* 4.0  CL 103 105 109  CO2 '23 23 23  '$ GLUCOSE 131* 145* 133*  BUN '10 11 14  '$ CREATININE 0.81 0.92 1.04*  CALCIUM 9.6 9.5 9.6  MG 1.6* 2.2 2.0    Liver Function Tests:  Recent Labs Lab 05/19/16 0051 05/19/16 0538 05/20/16 0650  AST '22 22 29  '$ ALT 13* 13* 13*  ALKPHOS 79 78 69  BILITOT 0.4 0.6 0.9  PROT 6.8 6.4* 5.9*  ALBUMIN 3.6 3.5 3.1*    CBC:  Recent Labs Lab 05/19/16 0538 05/20/16 0650 05/22/16 0255  WBC 13.4* 7.1 7.8  HGB 12.9 11.9* 12.5  HCT 37.0 35.2* 36.2  MCV 84.3 86.3 84.0  PLT 237 226 240    Cardiac Enzymes:  Recent Labs Lab 05/19/16 0031  05/23/16 1305 05/23/16 1828 05/24/16 0021  CKTOTAL 52  --   --   --   --   CKMB 2.1  --   --   --   --   TROPONINI  --   < > 0.11* 0.10* 0.10*  < > = values in this interval not displayed.  BNP: No results for input(s): PROBNP in the last 8760 hours.  Coagulation:  Recent Labs Lab 05/22/16 0255  INR 1.08    ECG:   Medications:   Scheduled Medications: . atorvastatin  20 mg Oral  q1800  . carvedilol  3.125 mg Oral BID WC  . citalopram  20 mg Oral Daily  . enoxaparin (LOVENOX) injection  40 mg Subcutaneous Q24H  . fluticasone  1-2 spray Each Nare BID  . fosfomycin  3 g Oral Q48H  . insulin aspart  0-9 Units Subcutaneous TID WC  . levETIRAcetam  500 mg Oral BID  . lisinopril  2.5 mg Oral Daily  . pantoprazole  40 mg Oral Daily  . topiramate  50 mg Oral BID     Infusions:   PRN Medications:  acetaminophen, albuterol, ALPRAZolam, hydrALAZINE, LORazepam      Assessment/Plan    1. Seizure - admitted with seizure, followed by neurology -brain MRI evidence of lesions concerning for possible lymphoma, neurosarcoid, or atypical infection - plan is for biopsy of lesions  2. Elevated troponins - peak troponin of  1.6 in setting of seizure. Trending down, EKG  - atypical postional chest pain -nuclear images reviewed by me. I agee there appears to an anteroseptal scar. There inferior defects suggests fairly mild ischemia, there is no tid. Mildly decreased LVEF by nuclear, echo is pending. Overall fairly mild area of myocardium currently at jeopardy.Will f/u echo results, but do not see nuclear findings as prohibitive for her upcoming neurosurigcal biopsy. With her brain lesions and urgency of biopsy procedure from cardiac standpoint we can describe her risk but really have no great options to lower her risk, as she would not be a cadidate for DAPT and would not delay her surgery 1 month or longer that would be neccesary if she were to receive a stent  - f/u echo for final preop risk assessment recs.         Carlyle Dolly, M.D., F.A.C.C.Patient ID: Kristi Kramer, female   DOB: May 05, 1946, 70 y.o.   MRN: [id number]

## 2016-05-25 NOTE — Procedures (Signed)
Patient has home CPAP bedside and ready for use.. Patient said she would place on herself when ready for bed and does not need assistance.

## 2016-05-25 NOTE — Progress Notes (Signed)
  Echocardiogram 2D Echocardiogram has been performed.  Kristi Kramer 05/25/2016, 2:44 PM

## 2016-05-25 NOTE — Progress Notes (Signed)
Subjective: No seizures.  Tolerating both AEDs well.  Brain biopsy planned for tomorrow.    Objective: Vital signs in last 24 hours: Temp:  [97.5 F (36.4 C)-98.6 F (37 C)] 98.1 F (36.7 C) (11/05 0800) Pulse Rate:  [66-107] 84 (11/05 0800) Resp:  [18-23] 23 (11/05 0800) BP: (102-172)/(60-86) 128/80 (11/05 0800) SpO2:  [95 %-98 %] 98 % (11/05 0300)  Intake/Output from previous day: 11/04 0701 - 11/05 0700 In: 240 [P.O.:240] Out: 100 [Urine:100] Intake/Output this shift: No intake/output data recorded. Nutritional status: Diet Carb Modified Fluid consistency: Thin; Room service appropriate? Yes  Neurologic Exam: Awake, alert, fully oriented. Language-fluent  C/N/R- intact Face symmetric Tongue midline Strength 5/5 bilateral Coordination intact bilateral Sensory-intact  Lab Results:  Recent Labs  05/24/16 0021 05/25/16 0141  NA 136 139  K 3.3* 4.0  CL 105 109  CO2 23 23  GLUCOSE 145* 133*  BUN 11 14  CREATININE 0.92 1.04*  CALCIUM 9.5 9.6   Lipid Panel No results for input(s): CHOL, TRIG, HDL, CHOLHDL, VLDL, LDLCALC in the last 72 hours.  Studies/Results: Nm Myocar Multi W/spect W/wall Motion / Ef  Result Date: 05/24/2016 CLINICAL DATA:  Abnormal brain MRI, smoker, diabetes, hypertension, elevated troponin EXAM: MYOCARDIAL IMAGING WITH SPECT (REST AND PHARMACOLOGIC-STRESS) GATED LEFT VENTRICULAR WALL MOTION STUDY LEFT VENTRICULAR EJECTION FRACTION TECHNIQUE: Standard myocardial SPECT imaging was performed after resting intravenous injection of 10 mCi Tc-2mtetrofosmin. Subsequently, intravenous infusion of Lexiscan was performed under the supervision of the Cardiology staff. At peak effect of the drug, 30 mCi Tc-953metrofosmin was injected intravenously and standard myocardial SPECT imaging was performed. Quantitative gated imaging was also performed to evaluate left ventricular wall motion, and estimate left ventricular ejection fraction. COMPARISON:  None.  FINDINGS: Perfusion: Large fixed defect of the anterior apical walls extending into the septum. Mid inferior wall decreased activity on the stress views compared to the rest views on both the short axis and vertical long axis imaging suspicious for inferior wall inducible ischemia. Wall Motion: Anterior apical hypokinesis noted and septal dyskinesis. No LV chamber dilatation. Left Ventricular Ejection Fraction: 47 % End diastolic volume 89 ml End systolic volume 47 ml IMPRESSION: 1. Large fixed anteroapical wall defect extending into the septum Mid inferior wall inducible ischemia suspected with pharmacologic stress. 2. Anterior apical hypokinesis. Septal dyskinesis. No LV chamber dilatation. 3. Left ventricular ejection fraction 47% 4. Non invasive risk stratification*: High *2012 Appropriate Use Criteria for Coronary Revascularization Focused Update: J Am Coll Cardiol. 204081;44(8):[email or web address] Electronically Signed   By: M.Jerilynn Mages Shick M.D.   On: 05/24/2016 12:52    Medications:  Scheduled: . atorvastatin  20 mg Oral q1800  . carvedilol  3.125 mg Oral BID WC  . citalopram  20 mg Oral Daily  . enoxaparin (LOVENOX) injection  40 mg Subcutaneous Q24H  . fluticasone  1-2 spray Each Nare BID  . fosfomycin  3 g Oral Q48H  . insulin aspart  0-9 Units Subcutaneous TID WC  . levETIRAcetam  500 mg Oral BID  . lisinopril  2.5 mg Oral Daily  . pantoprazole  40 mg Oral Daily  . topiramate  50 mg Oral BID    Assessment/Plan:  Multiple intracranial lesions, some enhancing and others not.  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  Brain biopsy is planned for tomorrow.   No seizures on 2 AEDs- continue same    LOS: 6 days   ESRogue JuryMD 05/25/2016  8:43 AM

## 2016-05-25 NOTE — Progress Notes (Signed)
Spoke with the patient to obtained consent but patient seems slow and unable to tell us as to what the procedure will be for tomorrow.    Called [place], [place] and consent obtained, witnessed by Atilano Ina, RN.

## 2016-05-25 NOTE — Progress Notes (Signed)
PROGRESS NOTE    Kristi Kramer  TKP:[id number] DOB: 1945/10/09 DOA: 05/18/2016 PCP: Cyndi Bender, PA-C   Brief Narrative:  Kristi Kramer is a 70 y.o.WF PMHx  Anxiety, CVA with left-sided weakness, HTN, DM Type 2 uncontrolled with complications, COPD, OSA,   Brought to the ER after patient had a witnessed seizure. Patient's boyfriend saw the patient had tonic-clonic seizure. EMS was called and after EMS arrival patient had another tonic-clonic seizure and was given Midazolam. Patient was postictal in the ER on arrival. CT head is unremarkable. Neurologist was consulted and patient was placed on loading dose of Keppra. After neurological evaluation patient was placed on increased dose of Topamax which patient usually takes for headaches. On exam patient is still post ictal but follows commands and moves all extremities. Pupils are reacting. No further neurological exam possible since patient is still postictal.    Subjective: 11/5  A/O 4,Negative CP, negative SOB, negative HA    Assessment & Plan:   Principal Problem:   Seizures ([place]) Active Problems:   Type 2 diabetes mellitus (HCC)   Essential hypertension   OSA (obstructive sleep apnea)   Seizure (HCC)   Abnormal MRI   Polypharmacy   Controlled diabetes mellitus type 2 with complications ([place])   Palliative care encounter   Elevated troponin   Preoperative clearance   ESBL (extended spectrum beta-lactamase) producing bacteria infection  Seizures -EEG: Abnormal could be consistent with structural abnormality see results below  Brain metastases/Primary Brain Lymphoma -Received call from [facility]: Believe they may see metastases/masses on normal brain MRI.  -contrast MRI. 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 Newman Pies Neurosurgery has seen previously March 2017 -CT Chest, Abdomen, Pelvis: Negative for neoplasm  -11/3 although  patient's initial increased troponin most likely secondary to seizure, Neurosurgery has requested that cardiology cleared patient prior surgical brain biopsy Monday.  Neoplasm workup -CT abdomen, pelvis, chest negative neoplasm. See results below  Thoracic compression fracture -Chronic/age indeterminate fracture see results below.  Chronic systolic and diastolic CHF -Coreg 5.170 mg BID -Hydralazine PRN -Lisinopril 2.5 mg daily  Chest pain -Reproducible left thoracic rib pain consistent with patient's reported fall.  Stroke (history of) -CT scan showed old strokes see results below  OSA -CPAP per respiratory therapy  COPD -Currently asymptomatic monitor closely  HTN -Hydralazine PRN  Elevated troponins -Most likely secondary to seizures -Will trend per neurosurgery request. Cardiology consult placed per Neurosurgery request.   positive ESBL UTI -Fosfomycin per pharmacy  Diabetes type 2 controlled with, complications -Sensitive SSI  Hypokalemia -K-Dur 50 mEq 2 doses  Hypomagnesemia -Magnesium IV 2 g     Goals of care  -Obtain Palliative Care Consult: Patient and significant other (Ernie) would like to speak with member of palliative care team to discuss appointing primary and secondary HCPOA, CODE STATUS, short-term and long-term goals of care.    DVT prophylaxis: Lovenox Code Status: Full Family Communication:   Family present for discussion of plan of care Disposition Plan: Per neurosurgery   Consultants:  Dr.Karen Jacquenette [facility] Dr. Newman Pies Neurosurgery pending    Procedures/Significant Events:  10/30 CT head WO contrast:-No acute intracranial pathology. - Small chronic infarcts at the high parietal lobes bilaterally. EEG - 10/30 - focal slowing over L hemisphere - frequent broad sharp waves over L frontopolar region  10/31 brain MRI:-New cerebral signal abnormality and swelling centered on the genu of the corpus callosum. Edematous  change also present in the right cerebellum. DDX: Tumefactive demyelination, CNS lymphoma, or atypical infection are primary considerations. 11/1 MRI Brain w/wo Contrast; -Moderate to severe abnormal enhancement and signal abnormality within infiltrative appearance in both inferior frontal gyri, the genu of the corpus callosum, and separately in the right cerebellum. --Nonenhancing T2 signal abnormality extending into the right hypothalamus. Mild associated regional mass effect.  -Evolution and/or regression of signal abnormality in the left parietal and superior right frontal lobes since January 2017 and March 2016, respectively.  -Small foci of cortical encephalomalacia developed at both areas. 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  -Other top differential diagnosis are Atypical CNS Infection and Neurosarcoidosis.  11/2 CT chest, abdomen, pelvis W contrast:CT CHEST:   negative finding of neoplasm. -Old multiple mild and mild to moderate age indeterminate upper thoracic compression fractures. -Diverticulosis without acute diverticulitis. 11/5 Echocardiogram:- Left ventricle: mild LVH. Focal basal hypertrophy. -LVEF =40% to 45%. - (grade 1 diastolic dysfunction). - Regional wall motion abnormality: Hypokinesis of the mid-apical  anterior, basal-mid anteroseptal, apical inferior, apical septal,  apical lateral, and apical myocardium.     Cultures 10/30 MRSA by PCR negative 10/31 urine  positive ESBL    Antimicrobials: Rocephin 10/29 > 11/1 Fosfomycin 11/3 >>   Devices    LINES / TUBES:      Continuous Infusions:   Objective: Vitals:   05/25/16 0751 05/25/16 0800 05/25/16 1054 05/25/16 1200  BP: 126/63 128/80 138/71 139/72  Pulse: 86 84  76  Resp:  (!) 23  20  Temp:  98.1 F (36.7 C)  98.1 F (36.7 C)  TempSrc:  Oral  Oral  SpO2:      Weight:      Height:        Intake/Output Summary (Last 24 hours) at 05/25/16 1739 Last data  filed at 05/25/16 0300  Gross per 24 hour  Intake              240 ml  Output                0 ml  Net              240 ml   Filed Weights   05/19/16 0958  Weight: 94.3 kg (208 lb)    Examination:  General: A/O 4, No acute respiratory distress Eyes: negative scleral hemorrhage, negative anisocoria, negative icterus ENT: Negative Runny nose, negative gingival bleeding, Neck:  Negative scars, masses, torticollis, lymphadenopathy, JVD Lungs: Clear to auscultation bilaterally without wheezes or crackles Cardiovascular: Regular rate and rhythm without murmur gallop or rub normal S1 and S2, left lateral thoracic tenderness to palpation (consistent with patient's report of fall) Abdomen: negative abdominal pain, nondistended, positive soft, bowel sounds, no rebound, no ascites, no appreciable mass Extremities: No significant cyanosis, clubbing, or edema bilateral lower extremities Skin: Negative rashes, lesions, ulcers Psychiatric:  Negative depression, negative anxiety, negative fatigue, negative mania  Central nervous system:  Cranial nerves II through XII intact, tongue/uvula midline, all extremities muscle strength 5/5, sensation intact throughout,negative dysarthria, negative expressive aphasia, negative receptive aphasia.  .     Data Reviewed: Care during the described time interval was provided by me .  I have reviewed this patient's available data, including medical history, events of note, physical examination, and all test results as part of my evaluation. I have personally reviewed and interpreted all radiology studies.  CBC:  Recent Labs Lab 05/19/16 0014 05/19/16 0538 05/20/16 0650 05/22/16 0255  WBC 13.9* 13.4* 7.1 7.8  NEUTROABS  --  11.1*  --   --   HGB 14.4 12.9 11.9* 12.5  HCT 42.7 37.0 35.2* 36.2  MCV 88.8 84.3 86.3 84.0  PLT 274 237 226 500   Basic Metabolic Panel:  Recent Labs Lab 05/19/16 0310  05/20/16 0650 05/22/16 0255 05/23/16 1305  05/24/16 0021 05/25/16 0141  NA  --   < > 140 136 135 136 139  K  --   < > 3.3* 2.8* 3.1* 3.3* 4.0  CL  --   < > 108 102 103 105 109  CO2  --   < > '24 25 23 23 23  '$ GLUCOSE  --   < > 122* 132* 131* 145* 133*  BUN  --   < > '16 8 10 11 14  '$ CREATININE  --   < > 0.88 0.86 0.81 0.92 1.04*  CALCIUM  --   < > 9.1 9.3 9.6 9.5 9.6  MG 1.9  --   --   --  1.6* 2.2 2.0  < > = values in this interval not displayed. GFR: Estimated Creatinine Clearance: 58.8 mL/min (by C-G formula based on SCr of 1.04 mg/dL (H)). Liver Function Tests:  Recent Labs Lab 05/19/16 0051 05/19/16 0538 05/20/16 0650  AST '22 22 29  '$ ALT 13* 13* 13*  ALKPHOS 79 78 69  BILITOT 0.4 0.6 0.9  PROT 6.8 6.4* 5.9*  ALBUMIN 3.6 3.5 3.1*   No results for input(s): LIPASE, AMYLASE in the last 168 hours. No results for input(s): AMMONIA in the last 168 hours. Coagulation Profile:  Recent Labs Lab 05/22/16 0255  INR 1.08   Cardiac Enzymes:  Recent Labs Lab 05/19/16 0031  05/19/16 2350 05/20/16 0650 05/23/16 1305 05/23/16 1828 05/24/16 0021  CKTOTAL 52  --   --   --   --   --   --   CKMB 2.1  --   --   --   --   --   --   TROPONINI  --   < > 1.61* 1.25* 0.11* 0.10* 0.10*  < > = values in this interval not displayed. BNP (last 3 results) No results for input(s): PROBNP in the last 8760 hours. HbA1C: No results for input(s): HGBA1C in the last 72 hours. CBG:  Recent Labs Lab 05/24/16 1725 05/24/16 2130 05/25/16 0822 05/25/16 1303 05/25/16 1701  GLUCAP 114* 126* 133* 119* 141*   Lipid Profile: No results for input(s): CHOL, HDL, LDLCALC, TRIG, CHOLHDL, LDLDIRECT in the last 72 hours. Thyroid Function Tests: No results for input(s): TSH, T4TOTAL, FREET4, T3FREE, THYROIDAB in the last 72 hours. Anemia Panel: No results for input(s): VITAMINB12, FOLATE, FERRITIN, TIBC, IRON, RETICCTPCT in the last 72 hours. Urine analysis:    Component Value Date/Time   COLORURINE YELLOW 05/19/2016 0245   APPEARANCEUR  CLOUDY (A) 05/19/2016 0245   LABSPEC 1.019 05/19/2016 0245   PHURINE 6.0 05/19/2016 0245   GLUCOSEU NEGATIVE 05/19/2016 0245   HGBUR SMALL (A) 05/19/2016 0245   BILIRUBINUR NEGATIVE 05/19/2016 0245   KETONESUR NEGATIVE 05/19/2016 0245   PROTEINUR NEGATIVE 05/19/2016 0245   UROBILINOGEN 0.2 05/31/2015 1420   NITRITE POSITIVE (A) 05/19/2016 0245   LEUKOCYTESUR NEGATIVE 05/19/2016 0245   Sepsis Labs: '@LABRCNTIP'$ (procalcitonin:4,lacticidven:4)  ) Recent Results (from the past 240 hour(s))  Culture, Urine     Status: Abnormal   Collection Time: 05/19/16  2:45 AM  Result Value Ref Range Status   Specimen Description URINE, RANDOM  Final   Special Requests NONE  Final   Culture (A)  Final    >=100,000 COLONIES/mL ESCHERICHIA COLI Confirmed Extended Spectrum Beta-Lactamase Producer (ESBL)    Report Status 05/23/2016 FINAL  Final   Organism ID, Bacteria ESCHERICHIA COLI (A)  Final      Susceptibility   Escherichia coli - MIC*    AMPICILLIN >=32 RESISTANT Resistant     CEFAZOLIN >=64 RESISTANT Resistant     CEFTRIAXONE >=64 RESISTANT Resistant     CIPROFLOXACIN >=4 RESISTANT Resistant     GENTAMICIN <=1 SENSITIVE Sensitive     IMIPENEM <=0.25 SENSITIVE Sensitive     NITROFURANTOIN >=512 RESISTANT Resistant     TRIMETH/SULFA <=20 SENSITIVE Sensitive     AMPICILLIN/SULBACTAM >=32 RESISTANT Resistant     PIP/TAZO <=4 SENSITIVE Sensitive     Extended ESBL POSITIVE Resistant     * >=100,000 COLONIES/mL ESCHERICHIA COLI  MRSA PCR Screening     Status: None   Collection Time: 05/19/16  9:52 AM  Result Value Ref Range Status   MRSA by PCR NEGATIVE NEGATIVE Final    Comment:        The GeneXpert MRSA Assay (FDA approved for NASAL specimens only), is one component of a comprehensive MRSA colonization surveillance program. It is not intended to diagnose MRSA infection nor to guide or monitor treatment for MRSA infections.   Culture, Urine     Status: Abnormal   Collection Time:  05/20/16 11:08 PM  Result Value Ref Range Status   Specimen Description URINE, RANDOM  Final   Special Requests NONE  Final   Culture (A)  Final    10,000 COLONIES/mL ESCHERICHIA COLI Confirmed Extended Spectrum Beta-Lactamase Producer (ESBL)    Report Status 05/23/2016 FINAL  Final   Organism ID, Bacteria ESCHERICHIA COLI (A)  Final      Susceptibility   Escherichia coli - MIC*    AMPICILLIN >=32 RESISTANT Resistant     CEFAZOLIN >=64 RESISTANT Resistant     CEFTRIAXONE >=64 RESISTANT Resistant     CIPROFLOXACIN >=4 RESISTANT Resistant     GENTAMICIN <=1 SENSITIVE Sensitive     IMIPENEM <=0.25 SENSITIVE Sensitive     NITROFURANTOIN >=512 RESISTANT Resistant     TRIMETH/SULFA <=20 SENSITIVE Sensitive     AMPICILLIN/SULBACTAM >=32 RESISTANT Resistant     PIP/TAZO <=4 SENSITIVE Sensitive     Extended ESBL POSITIVE Resistant     * 10,000 COLONIES/mL ESCHERICHIA COLI         Radiology Studies: Nm Myocar Multi W/spect W/wall Motion / Ef  Result Date: 05/24/2016 CLINICAL DATA:  Abnormal brain MRI, smoker, diabetes, hypertension, elevated troponin EXAM: MYOCARDIAL IMAGING WITH SPECT (REST AND PHARMACOLOGIC-STRESS) GATED LEFT VENTRICULAR WALL MOTION STUDY LEFT VENTRICULAR EJECTION FRACTION TECHNIQUE: Standard myocardial SPECT imaging was performed after resting intravenous injection of 10 mCi Tc-73mtetrofosmin. Subsequently, intravenous infusion of Lexiscan was performed under the supervision of the Cardiology staff. At peak effect of the drug, 30 mCi Tc-941metrofosmin was injected intravenously and standard myocardial SPECT imaging was performed. Quantitative gated imaging was also performed to evaluate left ventricular wall motion, and estimate left ventricular ejection fraction. COMPARISON:  None. FINDINGS: Perfusion: Large fixed defect of the anterior apical walls extending into the septum. Mid inferior wall decreased activity on the stress views compared to the rest views on both the  short axis and vertical long axis imaging suspicious for inferior wall inducible ischemia. Wall Motion: Anterior apical hypokinesis noted and septal dyskinesis. No LV chamber dilatation. Left  Ventricular Ejection Fraction: 47 % End diastolic volume 89 ml End systolic volume 47 ml IMPRESSION: 1. Large fixed anteroapical wall defect extending into the septum Mid inferior wall inducible ischemia suspected with pharmacologic stress. 2. Anterior apical hypokinesis. Septal dyskinesis. No LV chamber dilatation. 3. Left ventricular ejection fraction 47% 4. Non invasive risk stratification*: High *2012 Appropriate Use Criteria for Coronary Revascularization Focused Update: J Am Coll Cardiol. 6015;61(5):379-432. http://content.airportbarriers.com.aspx?articleid=[email or web address] Electronically Signed   By: Jerilynn Mages.  Shick M.D.   On: 05/24/2016 12:52        Scheduled Meds: . atorvastatin  20 mg Oral q1800  . carvedilol  3.125 mg Oral BID WC  . citalopram  20 mg Oral Daily  . fluticasone  1-2 spray Each Nare BID  . fosfomycin  3 g Oral Q48H  . insulin aspart  0-9 Units Subcutaneous TID WC  . levETIRAcetam  500 mg Oral BID  . lisinopril  2.5 mg Oral Daily  . pantoprazole  40 mg Oral Daily  . topiramate  50 mg Oral BID   Continuous Infusions:   LOS: 6 days    Time spent: 40 minutes    Myrth Dahan, Geraldo Docker, MD [facility] Pager [phone]   If 7PM-7AM, please contact night-coverage [email or web address] Password [facility] 05/25/2016, 5:39 PM

## 2016-05-26 ENCOUNTER — Inpatient Hospital Stay (HOSPITAL_COMMUNITY): Payer: Medicare Other | Admitting: Vascular Surgery

## 2016-05-26 ENCOUNTER — Inpatient Hospital Stay (HOSPITAL_COMMUNITY): Payer: Medicare Other

## 2016-05-26 ENCOUNTER — Encounter (HOSPITAL_COMMUNITY)
Admission: EM | Disposition: A | Discharge status: Home / Self Care | Payer: Self-pay | Source: Home / Self Care | Attending: Internal Medicine

## 2016-05-26 DIAGNOSIS — D496 Neoplasm of unspecified behavior of brain: Secondary | ICD-10-CM | POA: Diagnosis present

## 2016-05-26 HISTORY — PX: CRANIOTOMY: SHX93

## 2016-05-26 LAB — BASIC METABOLIC PANEL
ANION GAP: 9 (ref 5–15)
BUN: 15 mg/dL (ref 6–20)
CHLORIDE: 108 mmol/L (ref 101–111)
CO2: 21 mmol/L — AB (ref 22–32)
CREATININE: 0.82 mg/dL (ref 0.44–1.00)
Calcium: 9.7 mg/dL (ref 8.9–10.3)
GFR calc non Af Amer: >60 mL/min (ref >60)
Glucose, Bld: 133 mg/dL — ABNORMAL HIGH (ref 65–99)
POTASSIUM: 3.3 mmol/L — AB (ref 3.5–5.1)
Sodium: 138 mmol/L (ref 135–145)

## 2016-05-26 LAB — GLUCOSE, CAPILLARY
GLUCOSE-CAPILLARY: 115 mg/dL — AB (ref 65–99)
GLUCOSE-CAPILLARY: 125 mg/dL — AB (ref 65–99)
GLUCOSE-CAPILLARY: 142 mg/dL — AB (ref 65–99)
GLUCOSE-CAPILLARY: 217 mg/dL — AB (ref 65–99)

## 2016-05-26 LAB — TYPE AND SCREEN
ABO/RH(D): A POS
ANTIBODY SCREEN: NEGATIVE

## 2016-05-26 LAB — MAGNESIUM: Magnesium: 1.9 mg/dL (ref 1.7–2.4)

## 2016-05-26 SURGERY — CRANIOTOMY TUMOR EXCISION
Anesthesia: General

## 2016-05-26 MED ORDER — LABETALOL HCL 5 MG/ML IV SOLN
INTRAVENOUS | Status: AC
  Filled 2016-05-26: qty 4

## 2016-05-26 MED ORDER — LIDOCAINE-EPINEPHRINE 1 %-1:100000 IJ SOLN
INTRAMUSCULAR | Status: AC
  Filled 2016-05-26: qty 1

## 2016-05-26 MED ORDER — CEFAZOLIN SODIUM-DEXTROSE 2-3 GM-% IV SOLR
INTRAVENOUS | Status: DC | PRN
  Administered 2016-05-26: 2 g via INTRAVENOUS

## 2016-05-26 MED ORDER — SUGAMMADEX SODIUM 200 MG/2ML IV SOLN
INTRAVENOUS | Status: AC
  Filled 2016-05-26: qty 4

## 2016-05-26 MED ORDER — BACITRACIN ZINC 500 UNIT/GM EX OINT
TOPICAL_OINTMENT | CUTANEOUS | Status: DC | PRN
  Administered 2016-05-26: 1 via TOPICAL

## 2016-05-26 MED ORDER — DEXAMETHASONE SODIUM PHOSPHATE 10 MG/ML IJ SOLN
INTRAMUSCULAR | Status: AC
  Filled 2016-05-26: qty 1

## 2016-05-26 MED ORDER — DEXAMETHASONE SODIUM PHOSPHATE 4 MG/ML IJ SOLN
4.0000 mg | Freq: Three times a day (TID) | INTRAMUSCULAR | Status: DC
  Filled 2016-05-26: qty 1

## 2016-05-26 MED ORDER — DEXAMETHASONE SODIUM PHOSPHATE 4 MG/ML IJ SOLN
4.0000 mg | Freq: Four times a day (QID) | INTRAMUSCULAR | Status: DC
  Administered 2016-05-27 – 2016-05-28 (×3): 4 mg via INTRAVENOUS
  Filled 2016-05-26 (×2): qty 1

## 2016-05-26 MED ORDER — EPHEDRINE 5 MG/ML INJ
INTRAVENOUS | Status: AC
  Filled 2016-05-26: qty 10

## 2016-05-26 MED ORDER — ROCURONIUM BROMIDE 10 MG/ML (PF) SYRINGE
PREFILLED_SYRINGE | INTRAVENOUS | Status: AC
  Filled 2016-05-26: qty 10

## 2016-05-26 MED ORDER — MIDAZOLAM HCL 2 MG/2ML IJ SOLN
INTRAMUSCULAR | Status: AC
  Filled 2016-05-26: qty 2

## 2016-05-26 MED ORDER — PHENYLEPHRINE HCL 10 MG/ML IJ SOLN
INTRAVENOUS | Status: DC | PRN
  Administered 2016-05-26: 25 ug/min via INTRAVENOUS

## 2016-05-26 MED ORDER — SODIUM CHLORIDE 0.9 % IV SOLN
INTRAVENOUS | Status: DC | PRN
  Administered 2016-05-26: 09:00:00 via INTRAVENOUS

## 2016-05-26 MED ORDER — POTASSIUM CHLORIDE IN NACL 20-0.9 MEQ/L-% IV SOLN
INTRAVENOUS | Status: DC
  Administered 2016-05-26: 17:00:00 via INTRAVENOUS
  Filled 2016-05-26 (×2): qty 1000

## 2016-05-26 MED ORDER — LABETALOL HCL 5 MG/ML IV SOLN
10.0000 mg | INTRAVENOUS | Status: DC | PRN
  Administered 2016-05-26: 20 mg via INTRAVENOUS
  Filled 2016-05-26: qty 4

## 2016-05-26 MED ORDER — FENTANYL CITRATE (PF) 100 MCG/2ML IJ SOLN
INTRAMUSCULAR | Status: AC
  Filled 2016-05-26: qty 4

## 2016-05-26 MED ORDER — FAMOTIDINE IN NACL 20-0.9 MG/50ML-% IV SOLN
20.0000 mg | Freq: Two times a day (BID) | INTRAVENOUS | Status: DC
  Administered 2016-05-26: 20 mg via INTRAVENOUS
  Filled 2016-05-26: qty 50

## 2016-05-26 MED ORDER — PROPOFOL 10 MG/ML IV BOLUS
INTRAVENOUS | Status: AC
  Filled 2016-05-26: qty 40

## 2016-05-26 MED ORDER — DOCUSATE SODIUM 100 MG PO CAPS
100.0000 mg | ORAL_CAPSULE | Freq: Two times a day (BID) | ORAL | Status: DC
  Administered 2016-05-26 – 2016-05-28 (×4): 100 mg via ORAL
  Filled 2016-05-26 (×4): qty 1

## 2016-05-26 MED ORDER — FENTANYL CITRATE (PF) 100 MCG/2ML IJ SOLN
INTRAMUSCULAR | Status: AC
  Administered 2016-05-26: 25 ug via INTRAVENOUS
  Filled 2016-05-26: qty 2

## 2016-05-26 MED ORDER — ONDANSETRON HCL 4 MG PO TABS: 4.0000 mg | ORAL_TABLET | ORAL | Status: DC | PRN

## 2016-05-26 MED ORDER — HYDROCODONE-ACETAMINOPHEN 5-325 MG PO TABS
1.0000 | ORAL_TABLET | ORAL | Status: DC | PRN
  Administered 2016-05-26 – 2016-05-27 (×5): 2 via ORAL
  Filled 2016-05-26 (×5): qty 2

## 2016-05-26 MED ORDER — BISACODYL 10 MG RE SUPP: 10.0000 mg | Freq: Every day | RECTAL | Status: DC | PRN

## 2016-05-26 MED ORDER — PROMETHAZINE HCL 25 MG PO TABS: 12.5000 mg | ORAL_TABLET | ORAL | Status: DC | PRN

## 2016-05-26 MED ORDER — PHENYLEPHRINE 40 MCG/ML (10ML) SYRINGE FOR IV PUSH (FOR BLOOD PRESSURE SUPPORT)
PREFILLED_SYRINGE | INTRAVENOUS | Status: AC
  Filled 2016-05-26: qty 10

## 2016-05-26 MED ORDER — DEXAMETHASONE SODIUM PHOSPHATE 4 MG/ML IJ SOLN: 8.0000 mg | Freq: Once | INTRAMUSCULAR | Status: DC | PRN

## 2016-05-26 MED ORDER — CEFAZOLIN SODIUM-DEXTROSE 2-4 GM/100ML-% IV SOLN
2.0000 g | INTRAVENOUS | Status: DC
  Filled 2016-05-26: qty 100

## 2016-05-26 MED ORDER — BACITRACIN ZINC 500 UNIT/GM EX OINT
TOPICAL_OINTMENT | CUTANEOUS | Status: AC
  Filled 2016-05-26: qty 28.35

## 2016-05-26 MED ORDER — DEXAMETHASONE SODIUM PHOSPHATE 10 MG/ML IJ SOLN
6.0000 mg | Freq: Four times a day (QID) | INTRAMUSCULAR | Status: AC
  Administered 2016-05-26 – 2016-05-27 (×4): 6 mg via INTRAVENOUS
  Filled 2016-05-26 (×4): qty 1

## 2016-05-26 MED ORDER — FENTANYL CITRATE (PF) 100 MCG/2ML IJ SOLN
25.0000 ug | INTRAMUSCULAR | Status: DC | PRN
  Filled 2016-05-26: qty 2

## 2016-05-26 MED ORDER — BUPIVACAINE HCL (PF) 0.5 % IJ SOLN
INTRAMUSCULAR | Status: AC
  Filled 2016-05-26: qty 30

## 2016-05-26 MED ORDER — LIDOCAINE 2% (20 MG/ML) 5 ML SYRINGE
INTRAMUSCULAR | Status: AC
  Filled 2016-05-26: qty 5

## 2016-05-26 MED ORDER — FENTANYL CITRATE (PF) 100 MCG/2ML IJ SOLN
INTRAMUSCULAR | Status: DC | PRN
  Administered 2016-05-26: 100 ug via INTRAVENOUS
  Administered 2016-05-26 (×2): 50 ug via INTRAVENOUS

## 2016-05-26 MED ORDER — ONDANSETRON HCL 4 MG/2ML IJ SOLN: 4.0000 mg | INTRAMUSCULAR | Status: DC | PRN

## 2016-05-26 MED ORDER — ESMOLOL HCL 100 MG/10ML IV SOLN
INTRAVENOUS | Status: DC | PRN
  Administered 2016-05-26: 10 mg via INTRAVENOUS

## 2016-05-26 MED ORDER — PROPOFOL 10 MG/ML IV BOLUS
INTRAVENOUS | Status: DC | PRN
  Administered 2016-05-26: 100 mg via INTRAVENOUS

## 2016-05-26 MED ORDER — THROMBIN 20000 UNITS EX SOLR
CUTANEOUS | Status: DC | PRN
  Administered 2016-05-26: 11:00:00 via TOPICAL

## 2016-05-26 MED ORDER — FENTANYL CITRATE (PF) 100 MCG/2ML IJ SOLN
25.0000 ug | INTRAMUSCULAR | Status: DC | PRN
  Administered 2016-05-26 (×3): 25 ug via INTRAVENOUS

## 2016-05-26 MED ORDER — DEXAMETHASONE SODIUM PHOSPHATE 10 MG/ML IJ SOLN
10.0000 mg | Freq: Once | INTRAMUSCULAR | Status: AC
  Administered 2016-05-26: 10 mg via INTRAVENOUS

## 2016-05-26 MED ORDER — CEFAZOLIN SODIUM-DEXTROSE 2-4 GM/100ML-% IV SOLN
2.0000 g | Freq: Three times a day (TID) | INTRAVENOUS | Status: AC
  Administered 2016-05-26 – 2016-05-27 (×2): 2 g via INTRAVENOUS
  Filled 2016-05-26 (×3): qty 100

## 2016-05-26 MED ORDER — ROCURONIUM BROMIDE 100 MG/10ML IV SOLN
INTRAVENOUS | Status: DC | PRN
  Administered 2016-05-26: 50 mg via INTRAVENOUS
  Administered 2016-05-26 (×2): 10 mg via INTRAVENOUS

## 2016-05-26 MED ORDER — LIDOCAINE HCL (CARDIAC) 20 MG/ML IV SOLN
INTRAVENOUS | Status: DC | PRN
  Administered 2016-05-26: 50 mg via INTRAVENOUS

## 2016-05-26 MED ORDER — LIDOCAINE-EPINEPHRINE 1 %-1:100000 IJ SOLN
INTRAMUSCULAR | Status: DC | PRN
  Administered 2016-05-26: 10 mL

## 2016-05-26 MED ORDER — LABETALOL HCL 5 MG/ML IV SOLN
INTRAVENOUS | Status: DC | PRN
  Administered 2016-05-26 (×2): 10 mg via INTRAVENOUS

## 2016-05-26 MED ORDER — BACITRACIN 50000 UNITS IM SOLR
INTRAMUSCULAR | Status: DC | PRN
  Administered 2016-05-26: 11:00:00

## 2016-05-26 MED ORDER — HEMOSTATIC AGENTS (NO CHARGE) OPTIME
TOPICAL | Status: DC | PRN
  Administered 2016-05-26: 1 via TOPICAL

## 2016-05-26 MED ORDER — ONDANSETRON HCL 4 MG/2ML IJ SOLN
INTRAMUSCULAR | Status: AC
  Filled 2016-05-26: qty 2

## 2016-05-26 MED ORDER — MORPHINE SULFATE (PF) 2 MG/ML IV SOLN
1.0000 mg | INTRAVENOUS | Status: DC | PRN
  Administered 2016-05-26 – 2016-05-28 (×3): 2 mg via INTRAVENOUS
  Filled 2016-05-26 (×3): qty 1

## 2016-05-26 MED ORDER — 0.9 % SODIUM CHLORIDE (POUR BTL) OPTIME
TOPICAL | Status: DC | PRN
  Administered 2016-05-26 (×3): 1000 mL

## 2016-05-26 MED ORDER — SUGAMMADEX SODIUM 500 MG/5ML IV SOLN
INTRAVENOUS | Status: DC | PRN
  Administered 2016-05-26: 188.6 mg via INTRAVENOUS

## 2016-05-26 MED ORDER — THROMBIN 20000 UNITS EX SOLR
CUTANEOUS | Status: AC
  Filled 2016-05-26: qty 20000

## 2016-05-26 MED ORDER — ONDANSETRON HCL 4 MG/2ML IJ SOLN
4.0000 mg | Freq: Once | INTRAMUSCULAR | Status: AC | PRN
  Administered 2016-05-26: 4 mg via INTRAVENOUS

## 2016-05-26 SURGICAL SUPPLY — 81 items
BAG DECANTER FOR FLEXI CONT (MISCELLANEOUS) ×3 IMPLANT
BIT DRILL WIRE PASS 1.3MM (BIT) IMPLANT
BLADE CLIPPER SPEC (BLADE) IMPLANT
BLADE ULTRA TIP 2M (BLADE) IMPLANT
BUR ACORN 6.0 PRECISION (BURR) ×2 IMPLANT
BUR ACORN 6.0MM PRECISION (BURR) ×1
BUR SPIRAL ROUTER 2.3 (BUR) IMPLANT
BUR SPIRAL ROUTER 2.3MM (BUR)
CANISTER SUCT 3000ML PPV (MISCELLANEOUS) ×6 IMPLANT
CATH VENTRIC 35X38 W/TROCAR LG (CATHETERS) IMPLANT
CLIP TI MEDIUM 6 (CLIP) IMPLANT
CONT SPEC 4OZ CLIKSEAL STRL BL (MISCELLANEOUS) ×3 IMPLANT
COVER BACK TABLE 60X90IN (DRAPES) IMPLANT
DRAPE MICROSCOPE LEICA (MISCELLANEOUS) ×3 IMPLANT
DRAPE NEUROLOGICAL W/INCISE (DRAPES) ×3 IMPLANT
DRAPE SURG 17X23 STRL (DRAPES) IMPLANT
DRAPE WARM FLUID 44X44 (DRAPE) ×3 IMPLANT
DRILL WIRE PASS 1.3MM (BIT)
DURAMATRIX ONLAY 3X3 (Plate) ×3 IMPLANT
ELECT CAUTERY BLADE 6.4 (BLADE) ×3 IMPLANT
ELECT REM PT RETURN 9FT ADLT (ELECTROSURGICAL) ×3
ELECTRODE REM PT RTRN 9FT ADLT (ELECTROSURGICAL) ×1 IMPLANT
EVACUATOR 1/8 PVC DRAIN (DRAIN) IMPLANT
EVACUATOR SILICONE 100CC (DRAIN) IMPLANT
FORCEPS BIPOLAR SPETZLER 8 1.0 (NEUROSURGERY SUPPLIES) ×3 IMPLANT
GAUZE SPONGE 4X4 12PLY STRL (GAUZE/BANDAGES/DRESSINGS) ×3 IMPLANT
GAUZE SPONGE 4X4 16PLY XRAY LF (GAUZE/BANDAGES/DRESSINGS) IMPLANT
GLOVE BIO SURGEON STRL SZ8 (GLOVE) ×3 IMPLANT
GLOVE BIO SURGEON STRL SZ8.5 (GLOVE) ×3 IMPLANT
GLOVE ECLIPSE 8.5 STRL (GLOVE) ×3 IMPLANT
GLOVE EXAM NITRILE LRG STRL (GLOVE) IMPLANT
GLOVE EXAM NITRILE XL STR (GLOVE) IMPLANT
GLOVE EXAM NITRILE XS STR PU (GLOVE) IMPLANT
GLOVE INDICATOR 7.0 STRL GRN (GLOVE) ×3 IMPLANT
GLOVE INDICATOR 8.5 STRL (GLOVE) ×3 IMPLANT
GLOVE SS N UNI LF 7.0 STRL (GLOVE) ×9 IMPLANT
GOWN STRL REUS W/ TWL LRG LVL3 (GOWN DISPOSABLE) IMPLANT
GOWN STRL REUS W/ TWL XL LVL3 (GOWN DISPOSABLE) ×1 IMPLANT
GOWN STRL REUS W/TWL LRG LVL3 (GOWN DISPOSABLE)
GOWN STRL REUS W/TWL XL LVL3 (GOWN DISPOSABLE) ×2
HEMOSTAT SURGICEL 2X14 (HEMOSTASIS) ×3 IMPLANT
KIT BASIN OR (CUSTOM PROCEDURE TRAY) ×3 IMPLANT
KIT CLIP RANEY GUN (KITS) IMPLANT
KIT DRAIN CSF ACCUDRAIN (MISCELLANEOUS) IMPLANT
KIT ROOM TURNOVER OR (KITS) ×3 IMPLANT
MARKER SKIN DUAL TIP RULER LAB (MISCELLANEOUS) IMPLANT
NEEDLE HYPO 22GX1.5 SAFETY (NEEDLE) ×3 IMPLANT
NS IRRIG 1000ML POUR BTL (IV SOLUTION) ×3 IMPLANT
PACK CRANIOTOMY (CUSTOM PROCEDURE TRAY) ×3 IMPLANT
PAD ARMBOARD 7.5X6 YLW CONV (MISCELLANEOUS) ×3 IMPLANT
PATTIES SURGICAL .25X.25 (GAUZE/BANDAGES/DRESSINGS) IMPLANT
PATTIES SURGICAL .5 X.5 (GAUZE/BANDAGES/DRESSINGS) IMPLANT
PATTIES SURGICAL .5 X3 (DISPOSABLE) IMPLANT
PATTIES SURGICAL 1X1 (DISPOSABLE) IMPLANT
PIN MAYFIELD SKULL DISP (PIN) IMPLANT
RUBBERBAND STERILE (MISCELLANEOUS) ×6 IMPLANT
SPECIMEN JAR SMALL (MISCELLANEOUS) IMPLANT
SPONGE NEURO XRAY DETECT 1X3 (DISPOSABLE) IMPLANT
SPONGE SURGIFOAM ABS GEL 100 (HEMOSTASIS) ×3 IMPLANT
STAPLER SKIN PROX WIDE 3.9 (STAPLE) ×3 IMPLANT
STOCKINETTE 6  STRL (DRAPES)
STOCKINETTE 6 STRL (DRAPES) IMPLANT
SUT ETHILON 3 0 FSL (SUTURE) IMPLANT
SUT ETHILON 3 0 PS 1 (SUTURE) IMPLANT
SUT NURALON 4 0 TR CR/8 (SUTURE) ×6 IMPLANT
SUT PROLENE 6 0 BV (SUTURE) IMPLANT
SUT SILK 0 TIES 10X30 (SUTURE) IMPLANT
SUT VIC AB 1 CT1 18XBRD ANBCTR (SUTURE) ×1 IMPLANT
SUT VIC AB 1 CT1 8-18 (SUTURE) ×2
SUT VIC AB 2-0 CP2 18 (SUTURE) ×3 IMPLANT
SUT VIC AB 3-0 FS2 27 (SUTURE) IMPLANT
SUT VICRYL 4-0 PS2 18IN ABS (SUTURE) IMPLANT
SYR CONTROL 10ML LL (SYRINGE) ×3 IMPLANT
TAPE CLOTH SURG 4X10 WHT LF (GAUZE/BANDAGES/DRESSINGS) ×3 IMPLANT
TOWEL OR 17X24 6PK STRL BLUE (TOWEL DISPOSABLE) ×3 IMPLANT
TOWEL OR 17X26 10 PK STRL BLUE (TOWEL DISPOSABLE) ×3 IMPLANT
TRAY FOLEY W/METER SILVER 16FR (SET/KITS/TRAYS/PACK) IMPLANT
TUBE CONNECTING 12'X1/4 (SUCTIONS)
TUBE CONNECTING 12X1/4 (SUCTIONS) IMPLANT
UNDERPAD 30X30 (UNDERPADS AND DIAPERS) IMPLANT
WATER STERILE IRR 1000ML POUR (IV SOLUTION) ×3 IMPLANT

## 2016-05-26 NOTE — Anesthesia Procedure Notes (Signed)
Central Venous Catheter Insertion Performed by: anesthesiologist Patient location: Pre-op. Preanesthetic checklist: patient identified, IV checked, site marked, risks and benefits discussed, surgical consent, monitors and equipment checked, pre-op evaluation, timeout performed and anesthesia consent Lidocaine 1% used for infiltration Landmarks identified Catheter size: 8 Fr Central line was placed.Double lumen Procedure performed using ultrasound guided technique. Attempts: 1 Following insertion, dressing applied, line sutured and Biopatch. Post procedure assessment: blood return through all ports and free fluid flow. Patient tolerated the procedure well with no immediate complications.

## 2016-05-26 NOTE — Progress Notes (Signed)
Patient ID: Kristi Kramer, female   DOB: [birth year], 70 y.o.   MRN: [id number] Subjective:  The patient is alert and pleasant. She looks well. She is in no apparent distress.  Objective: Vital signs in last 24 hours: Temp:  [97.7 F (36.5 C)-98.5 F (36.9 C)] 98.1 F (36.7 C) (11/06 1505) Pulse Rate:  [63-91] 68 (11/06 1457) Resp:  [14-33] 14 (11/06 0347) BP: (106-153)/(65-93) 145/78 (11/06 1457) SpO2:  [97 %-100 %] 98 % (11/06 1457)  Intake/Output from previous day: 11/05 0701 - 11/06 0700 In: 120 [P.O.:120] Out: 450 [Urine:450] Intake/Output this shift: Total I/O In: 1400 [I.V.:1400] Out: 170 [Urine:160; Blood:10]  Physical exam the patient is alert and pleasant. She answers simple questions. She is moving all 4 extremities well.  Lab Results: No results for input(s): WBC, HGB, HCT, PLT in the last 72 hours. BMET  Recent Labs  05/25/16 0141 05/26/16 0217  NA 139 138  K 4.0 3.3*  CL 109 108  CO2 23 21*  GLUCOSE 133* 133*  BUN 14 15  CREATININE 1.04* 0.82  CALCIUM 9.6 9.7    Studies/Results: No results found.  Assessment/Plan: The patient is doing well. I spoke with her family. The primary diagnosis is lymphoma. We will await final pathology.  LOS: 7 days     Michala Deblanc D 05/26/2016, 3:29 PM

## 2016-05-26 NOTE — Op Note (Signed)
Brief history: The patient is a 70 year old white female who has had a prior abnormal brain MRI. She was scheduled for a brain biopsy about 6 months ago but the lesion disappeared. She was admitted with seizures and further workup included brain MRI which demonstrated multiple intracranial lesions. A CT of the chest abdomen pelvis which is negative. We discussed the various treatment options. I recommended a craniectomy for biopsy/resection of her cerebellar lesion. The patient has weighed the risks, benefits, and alternative surgery and decided to proceed with the operation.  Preoperative diagnosis: Brain lesions  Postop diagnosis: The same, memory diagnosis is lymphoma  Procedure: Right suboccipital craniectomy for resection of right cerebellar tumor.  Surgeon: Dr. Earle Gell  Assistant: Dr. Jacqualin Combes  Anesthesia: Gen. endotracheal  Estimated blood loss: 100 mL  Specimens: Tumor  Drains: None  Complications: None  Description of procedure: The patient was brought to the operating room by the anesthesia team. General endotracheal anesthesia was induced. I applied the [place] 3. headrest to the patient's calvarium. The patient was then turned to the prone position on chest rolls. The patient's suboccipital region was then shaved with clippers and prepared with a now scrub and Betadine solution. Sterile drapes were applied. I then injected the area to be incised with lidocaine with epinephrine solution. I used a scalpel to make a right suboccipital incision. I used the cautery to divide the fascia and dissect through the muscle. I exposed the underlying occipital and suboccipital bone. I then used a high-speed joint to create a right suboccipital craniectomy. I then incised the exposed dura in a cruciate fashion. We then tacked up the dural edges with 4-0 Nurolon suture. We encountered a extra-axial lesion. We obtain specimens. We sent the specimens for frozen section which came back  consistent with lymphoma.  We then brought the operating microscope into the field. Under his medication and illumination we used microdissection to free up the tumor from the surrounding cerebellum. Using suction, irrigation, pituitary forceps, etc. we obtained a gross total resection of the tumor. We then obtained hemostasis using bipolar cautery. I irrigated the wound out with bacitracin solution. I then lined the tumor resection cavity with Surgicel. We then reapproximated patient's dura with interrupted 4-0 Nurolon suture. We then laid DuraGen over the exposed dura. We then removed the cerebellar retractor and then reapproximated patient's cervical fascia with interrupted #1 Vicryl suture. We reapproximated subcutaneous tissue with 2-0 Vicryl suture. We reapproximated the skin with steel staples. The wound was then coated with bacitracin ointment. A sterile dressing was applied. The drapes were removed. The patient was subsequently returned to the supine position. I then removed the [place] 3 point head rest from her calvarium. By report all sponge, instrument, and needle counts were correct at the end of this case.

## 2016-05-26 NOTE — Transfer of Care (Signed)
Immediate Anesthesia Transfer of Care Note  Patient: Kristi Kramer  Procedure(s) Performed: Procedure(s) with comments: CRANIOTOMY TUMOR EXCISION (N/A) - CRANIOTOMY TUMOR EXCISION  Patient Location: PACU  Anesthesia Type:General  Level of Consciousness: awake, alert  and oriented  Airway & Oxygen Therapy: Patient Spontanous Breathing and Patient connected to face mask oxygen  Post-op Assessment: Report given to RN and Post -op Vital signs reviewed and stable  Post vital signs: Reviewed and stable  Last Vitals:  Vitals:   05/26/16 0347 05/26/16 0822  BP: 129/78 (!) 148/71  Pulse: 64   Resp: 14   Temp: 36.8 C 36.9 C    Last Pain:  Vitals:   05/26/16 0822  TempSrc: Oral  PainSc: 6       Patients Stated Pain Goal: 0 (14/23/95 3202)  Complications: No apparent anesthesia complications

## 2016-05-26 NOTE — Anesthesia Procedure Notes (Signed)
Procedure Name: Intubation Date/Time: 05/26/2016 12:26 PM Performed by: Eligha Bridegroom Pre-anesthesia Checklist: Emergency Drugs available, Patient identified, Suction available and Patient being monitored Patient Re-evaluated:Patient Re-evaluated prior to inductionOxygen Delivery Method: Circle system utilized Preoxygenation: Pre-oxygenation with 100% oxygen Intubation Type: IV induction Ventilation: Mask ventilation without difficulty and Oral airway inserted - appropriate to patient size Grade View: Grade I Tube type: Oral Tube size: 7.0 mm Number of attempts: 1 Airway Equipment and Method: Stylet and LTA kit utilized Placement Confirmation: ETT inserted through vocal cords under direct vision,  positive ETCO2 and breath sounds checked- equal and bilateral Secured at: 21 cm Tube secured with: Tape Dental Injury: Teeth and Oropharynx as per pre-operative assessment

## 2016-05-26 NOTE — Anesthesia Preprocedure Evaluation (Addendum)
Anesthesia Evaluation  Patient identified by MRN, date of birth, ID band Patient awake    Reviewed: Allergy & Precautions, NPO status , Patient's Chart, lab work & pertinent test results  Airway Mallampati: II  TM Distance: >3 FB Neck ROM: Full    Dental  (+) Edentulous Upper, Edentulous Lower   Pulmonary Current Smoker,    breath sounds clear to auscultation       Cardiovascular hypertension,  Rhythm:Regular Rate:Normal     Neuro/Psych    GI/Hepatic   Endo/Other  diabetes  Renal/GU      Musculoskeletal   Abdominal (+) + obese,   Peds  Hematology   Anesthesia Other Findings   Reproductive/Obstetrics                             Anesthesia Physical Anesthesia Plan  ASA: III  Anesthesia Plan: General   Post-op Pain Management:    Induction: Intravenous  Airway Management Planned: Oral ETT  Additional Equipment: Arterial line and CVP  Intra-op Plan:   Post-operative Plan: Extubation in OR  Informed Consent: I have reviewed the patients History and Physical, chart, labs and discussed the procedure including the risks, benefits and alternatives for the proposed anesthesia with the patient or authorized representative who has indicated his/her understanding and acceptance.     Plan Discussed with: CRNA and Anesthesiologist  Anesthesia Plan Comments:         Anesthesia Quick Evaluation

## 2016-05-26 NOTE — Anesthesia Postprocedure Evaluation (Signed)
Anesthesia Post Note  Patient: Kristi Kramer  Procedure(s) Performed: Procedure(s) (LRB): CRANIOTOMY TUMOR EXCISION (N/A)  Patient location during evaluation: PACU Anesthesia Type: General Level of consciousness: awake, awake and alert and oriented Pain management: pain level controlled Vital Signs Assessment: post-procedure vital signs reviewed and stable Respiratory status: spontaneous breathing, nonlabored ventilation and respiratory function stable Cardiovascular status: blood pressure returned to baseline Anesthetic complications: no    Last Vitals:  Vitals:   05/26/16 1715 05/26/16 1730  BP:  134/81  Pulse: 65 (!) 51  Resp: 20 13  Temp:      Last Pain:  Vitals:   05/26/16 1730  TempSrc:   PainSc: 3                  Shantavia Jha COKER

## 2016-05-26 NOTE — Progress Notes (Signed)
Patient transferred to [facility] after surgical procedure.  Patients boyfriend picked up belongings including CPAP machine.

## 2016-05-26 NOTE — Progress Notes (Signed)
Subjective:  The patient is alert and pleasant. She is in no apparent distress. She is ready for surgery.  Objective: Vital signs in last 24 hours: Temp:  [97.7 F (36.5 C)-98.5 F (36.9 C)] 98.5 F (36.9 C) (11/06 0822) Pulse Rate:  [63-91] 64 (11/06 0347) Resp:  [14-33] 14 (11/06 0347) BP: (106-153)/(65-93) 148/71 (11/06 0822) SpO2:  [97 %-100 %] 97 % (11/06 0347)  Intake/Output from previous day: 11/05 0701 - 11/06 0700 In: 120 [P.O.:120] Out: 450 [Urine:450] Intake/Output this shift: No intake/output data recorded.  Physical exam patient is alert and oriented. She is all 4 extremities.  Lab Results: No results for input(s): WBC, HGB, HCT, PLT in the last 72 hours. BMET  Recent Labs  05/25/16 0141 05/26/16 0217  NA 139 138  K 4.0 3.3*  CL 109 108  CO2 23 21*  GLUCOSE 133* 133*  BUN 14 15  CREATININE 1.04* 0.82  CALCIUM 9.6 9.7    Studies/Results: Nm Myocar Multi W/spect W/wall Motion / Ef  Result Date: 05/24/2016 CLINICAL DATA:  Abnormal brain MRI, smoker, diabetes, hypertension, elevated troponin EXAM: MYOCARDIAL IMAGING WITH SPECT (REST AND PHARMACOLOGIC-STRESS) GATED LEFT VENTRICULAR WALL MOTION STUDY LEFT VENTRICULAR EJECTION FRACTION TECHNIQUE: Standard myocardial SPECT imaging was performed after resting intravenous injection of 10 mCi Tc-58mtetrofosmin. Subsequently, intravenous infusion of Lexiscan was performed under the supervision of the Cardiology staff. At peak effect of the drug, 30 mCi Tc-971metrofosmin was injected intravenously and standard myocardial SPECT imaging was performed. Quantitative gated imaging was also performed to evaluate left ventricular wall motion, and estimate left ventricular ejection fraction. COMPARISON:  None. FINDINGS: Perfusion: Large fixed defect of the anterior apical walls extending into the septum. Mid inferior wall decreased activity on the stress views compared to the rest views on both the short axis and vertical long  axis imaging suspicious for inferior wall inducible ischemia. Wall Motion: Anterior apical hypokinesis noted and septal dyskinesis. No LV chamber dilatation. Left Ventricular Ejection Fraction: 47 % End diastolic volume 89 ml End systolic volume 47 ml IMPRESSION: 1. Large fixed anteroapical wall defect extending into the septum Mid inferior wall inducible ischemia suspected with pharmacologic stress. 2. Anterior apical hypokinesis. Septal dyskinesis. No LV chamber dilatation. 3. Left ventricular ejection fraction 47% 4. Non invasive risk stratification*: High *2012 Appropriate Use Criteria for Coronary Revascularization Focused Update: J Am Coll Cardiol. 206979;48(0):[email or web address] Electronically Signed   By: M.Jerilynn Mages Shick M.D.   On: 05/24/2016 12:52    Assessment/Plan: Brain lesions: I have again discussed situation with the patient, and now with her family. We discussed the various treatment options. 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 I answered all her questions. She wants to proceed with surgery.  LOS: 7 days     Austyn Perriello D 05/26/2016, 11:56 AM

## 2016-05-26 NOTE — Progress Notes (Signed)
Subjective: No seizures since admission. Tolerating AEDs. Still having some chest pain which is getting better since admission. No other complaints. No weakness/numbness/tingling.  Vital signs in last 24 hours: Temp:  [97.7 F (36.5 C)-98.5 F (36.9 C)] 98.5 F (36.9 C) (11/06 0822) Pulse Rate:  [63-91] 64 (11/06 0347) Resp:  [14-33] 14 (11/06 0347) BP: (106-153)/(65-93) 148/71 (11/06 0822) SpO2:  [97 %-100 %] 97 % (11/06 0347)  Intake/Output from previous day: 11/05 0701 - 11/06 0700 In: 120 [P.O.:120] Out: 450 [Urine:450] Intake/Output this shift: No intake/output data recorded. Nutritional status: Diet NPO time specified  Neurologic Exam: Awake, alert, fully oriented. Language-fluent  C/N/R- intact Face symmetric Tongue midline Strength 5/5 bilateral Coordination intact bilateral Sensory-intact  Lab Results:  Recent Labs  05/25/16 0141 05/26/16 0217  NA 139 138  K 4.0 3.3*  CL 109 108  CO2 23 21*  GLUCOSE 133* 133*  BUN 14 15  CREATININE 1.04* 0.82  CALCIUM 9.6 9.7   Lipid Panel No results for input(s): CHOL, TRIG, HDL, CHOLHDL, VLDL, LDLCALC in the last 72 hours.  Studies/Results: Nm Myocar Multi W/spect W/wall Motion / Ef  Result Date: 05/24/2016 CLINICAL DATA:  Abnormal brain MRI, smoker, diabetes, hypertension, elevated troponin EXAM: MYOCARDIAL IMAGING WITH SPECT (REST AND PHARMACOLOGIC-STRESS) GATED LEFT VENTRICULAR WALL MOTION STUDY LEFT VENTRICULAR EJECTION FRACTION TECHNIQUE: Standard myocardial SPECT imaging was performed after resting intravenous injection of 10 mCi Tc-26mtetrofosmin. Subsequently, intravenous infusion of Lexiscan was performed under the supervision of the Cardiology staff. At peak effect of the drug, 30 mCi Tc-948metrofosmin was injected intravenously and standard myocardial SPECT imaging was performed. Quantitative gated imaging was also performed to evaluate left ventricular wall motion, and estimate left ventricular ejection  fraction. COMPARISON:  None. FINDINGS: Perfusion: Large fixed defect of the anterior apical walls extending into the septum. Mid inferior wall decreased activity on the stress views compared to the rest views on both the short axis and vertical long axis imaging suspicious for inferior wall inducible ischemia. Wall Motion: Anterior apical hypokinesis noted and septal dyskinesis. No LV chamber dilatation. Left Ventricular Ejection Fraction: 47 % End diastolic volume 89 ml End systolic volume 47 ml IMPRESSION: 1. Large fixed anteroapical wall defect extending into the septum Mid inferior wall inducible ischemia suspected with pharmacologic stress. 2. Anterior apical hypokinesis. Septal dyskinesis. No LV chamber dilatation. 3. Left ventricular ejection fraction 47% 4. Non invasive risk stratification*: High *2012 Appropriate Use Criteria for Coronary Revascularization Focused Update: J Am Coll Cardiol. 201884;16(6):063-016http://[email or web address] Electronically Signed   By: M.Jerilynn Mages Shick M.D.   On: 05/24/2016 12:52    Medications:  Scheduled: . atorvastatin  20 mg Oral q1800  . carvedilol  3.125 mg Oral BID WC  . citalopram  20 mg Oral Daily  . fluticasone  1-2 spray Each Nare BID  . fosfomycin  3 g Oral Q48H  . insulin aspart  0-9 Units Subcutaneous TID WC  . levETIRAcetam  500 mg Oral BID  . lisinopril  2.5 mg Oral Daily  . pantoprazole  40 mg Oral Daily  . topiramate  50 mg Oral BID    Assessment/Plan:  Multiple intracranial lesions, some enhancing and others not.  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    Cardiology cleared for brain biopsy, has some potential area of ischemia which is deemed small in size and any cardiac intervention would delay biopsy. Brain biopsy as planned for today.   No seizures on 2 AEDs- continue same (keppra '500mg'$  bid  and topamax '50mg'$  bid)     LOS: 7 days   Dellia Nims, MD, PGY-3 05/26/2016  9:13  AM

## 2016-05-27 ENCOUNTER — Encounter (HOSPITAL_COMMUNITY): Payer: Self-pay | Admitting: Neurosurgery

## 2016-05-27 ENCOUNTER — Inpatient Hospital Stay (HOSPITAL_COMMUNITY): Payer: Medicare Other

## 2016-05-27 DIAGNOSIS — D496 Neoplasm of unspecified behavior of brain: Secondary | ICD-10-CM

## 2016-05-27 LAB — GLUCOSE, CAPILLARY
GLUCOSE-CAPILLARY: 178 mg/dL — AB (ref 65–99)
GLUCOSE-CAPILLARY: 207 mg/dL — AB (ref 65–99)
GLUCOSE-CAPILLARY: 216 mg/dL — AB (ref 65–99)
Glucose-Capillary: 213 mg/dL — ABNORMAL HIGH (ref 65–99)

## 2016-05-27 LAB — MAGNESIUM: MAGNESIUM: 1.8 mg/dL (ref 1.7–2.4)

## 2016-05-27 MED ORDER — FAMOTIDINE 20 MG PO TABS
20.0000 mg | ORAL_TABLET | Freq: Two times a day (BID) | ORAL | Status: DC
  Administered 2016-05-27 – 2016-05-28 (×2): 20 mg via ORAL
  Filled 2016-05-27 (×2): qty 1

## 2016-05-27 MED ORDER — TOPIRAMATE 25 MG PO TABS
75.0000 mg | ORAL_TABLET | Freq: Two times a day (BID) | ORAL | Status: DC
  Administered 2016-05-27 – 2016-05-28 (×2): 75 mg via ORAL
  Filled 2016-05-27 (×2): qty 3

## 2016-05-27 NOTE — Progress Notes (Signed)
Subjective: Doing well s/p brain bx. Having some sore throat and mild intermittent chest pain. No other complaints. No seizures.    Vital signs in last 24 hours: Temp:  [97.4 F (36.3 C)-98.1 F (36.7 C)] 97.4 F (36.3 C) (11/07 0800) Pulse Rate:  [51-73] 61 (11/07 0500) Resp:  [12-30] 25 (11/07 0700) BP: (109-197)/(51-145) 137/102 (11/07 0700) SpO2:  [91 %-100 %] 96 % (11/07 0700) Arterial Line BP: (90-193)/(61-141) 115/111 (11/07 0200)  Intake/Output from previous day: 11/06 0701 - 11/07 0700 In: 2893.8 [P.O.:300; I.V.:2343.8; IV Piggyback:250] Out: 2080 [Urine:2070; Blood:10] Intake/Output this shift: Total I/O In: -  Out: 250 [Urine:250] Nutritional status: Diet Carb Modified Fluid consistency: Thin; Room service appropriate? Yes  Neurologic Exam: Awake, alert, fully oriented. Language-fluent  C/N/R- intact Face symmetric Tongue midline Strength 5/5 bilateral, slight weakness on left lower ext (chronic).  Coordination intact bilateral Sensory-intact Has dressing over right neck/occipitus.   Lab Results:  Recent Labs  05/25/16 0141 05/26/16 0217  NA 139 138  K 4.0 3.3*  CL 109 108  CO2 23 21*  GLUCOSE 133* 133*  BUN 14 15  CREATININE 1.04* 0.82  CALCIUM 9.6 9.7   Lipid Panel No results for input(s): CHOL, TRIG, HDL, CHOLHDL, VLDL, LDLCALC in the last 72 hours.  Studies/Results: Dg Chest Port 1 View  Result Date: 05/26/2016 CLINICAL DATA:  Post CVL insertion. EXAM: PORTABLE CHEST - 1 VIEW COMPARISON:  04/30/2016 FINDINGS: Right IJ Central venous catheter to the proximal SVC. No pneumothorax. Linear scarring in the left infrahilar region. Lungs otherwise clear. Heart size upper limits normal for technique. No effusion. Cervical fixation hardware noted. IMPRESSION: 1. Central line to SVC without pneumothorax. Electronically Signed   By: Lucrezia Europe M.D.   On: 05/26/2016 15:48    Medications:  Scheduled: . atorvastatin  20 mg Oral q1800  . carvedilol  3.125  mg Oral BID WC  . citalopram  20 mg Oral Daily  . dexamethasone  6 mg Intravenous Q6H   Followed by  . dexamethasone  4 mg Intravenous Q6H   Followed by  . [START ON 05/28/2016] dexamethasone  4 mg Intravenous Q8H  . docusate sodium  100 mg Oral BID  . famotidine  20 mg Oral BID  . fluticasone  1-2 spray Each Nare BID  . fosfomycin  3 g Oral Q48H  . insulin aspart  0-9 Units Subcutaneous TID WC  . levETIRAcetam  500 mg Oral BID  . lisinopril  2.5 mg Oral Daily  . pantoprazole  40 mg Oral Daily  . topiramate  50 mg Oral BID    Assessment/Plan:  Multiple intracranial lesions, some enhancing and others not. S/p craniotomy lesion excision. 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 Will defer treatment plan to neurosurgery and oncology team.   No seizures on 2 AEDs- continue same (keppra '500mg'$  bid and topamax '50mg'$  bid)  We will sign off. Please call us as needed.     LOS: 8 days   Dellia Nims, MD, PGY-3 05/27/2016  9:07 AM   I have seen this patient and agree with the above, doing well post-operatively, gives 1917 as year but otherwise appropriate.   I am not certain why keppra was added after initial dose, but given that she was already on topiramate '50mg'$  daily prior to this, I am not sure she will need two AEDs long term. She has been tolerating these well, however, and I would not favor stopping keppra at this  time.   I will go up to '75mg'$  BID on topiramate and continue keppra '50mg'$  BID.   I will also request follow up with her neurologist. It may be reasonable to titrate to '100mg'$  BID on topiramate and stop keppra at that time, but I would not increase topiramate that much in a single step.  Will sign off, please call with further questions.   Roland Rack, MD [facility] [phone]  If 7pm- 7am, please page neurology on call as listed in [place].

## 2016-05-27 NOTE — Procedures (Signed)
ELECTROENCEPHALOGRAM REPORT  Date of Study: 05/27/2016  Patient's Name: Kristi Kramer MRN: [id number] Date of Birth: 1945/08/10  Referring Provider: Dr. Roland Rack  Clinical History: This is a 70 year old woman with witnessed seizures.  Medications: topiramate (TOPAMAX) tablet 75 mg  levETIRAcetam (KEPPRA) tablet 500 mg  acetaminophen (TYLENOL) tablet 650 mg  albuterol (PROVENTIL) (2.5 MG/3ML) 0.083% nebulizer solution 2.5 mg  atorvastatin (LIPITOR) tablet 20 mg  bisacodyl (DULCOLAX) suppository 10 mg  carvedilol (COREG) tablet 3.125 mg  citalopram (CELEXA) tablet 20 mg  dexamethasone (DECADRON) injection 4 mg  famotidine (PEPCID) tablet 20 mg  fluticasone (FLONASE) 50 MCG/ACT nasal spray 1-2 spray  fosfomycin (MONUROL) packet 3 g  hydrALAZINE (APRESOLINE) injection 10 mg  HYDROcodone-acetaminophen (NORCO/VICODIN) 5-325 MG per tablet 1-2 tablet  insulin aspart (novoLOG) injection 0-9 Units  labetalol (NORMODYNE,TRANDATE) injection 10-40 mg  lisinopril (PRINIVIL,ZESTRIL) tablet 2.5 mg  LORazepam (ATIVAN) injection 1 mg  morphine 2 MG/ML injection 1-2 mg   Technical Summary: A multichannel digital EEG recording measured by the international 10-20 system with electrodes applied with paste and impedances below 5000 ohms performed as portable with EKG monitoring in an awake and confused patient.  Hyperventilation and photic stimulation were not performed.  The digital EEG was referentially recorded, reformatted, and digitally filtered in a variety of bipolar and referential montages for optimal display.   Description: The patient is awake and confused during the recording.  During maximal wakefulness, there is a symmetric, medium voltage 8 Hz posterior dominant rhythm that poorly attenuates with eye opening and eye closure. This is admixed with a moderate amount of diffuse 4-5 Hz theta and 2-3 Hz delta slowing of the waking background. Normal sleep architecture is not seen.  Hyperventilation and photic stimulation were not performed.  There were no epileptiform discharges or electrographic seizures seen.    EKG lead showed sinus bradycardia.  Impression: This awake EEG is abnormal due to moderate diffuse slowing of the waking background.  Clinical Correlation of the above findings indicates diffuse cerebral dysfunction that is non-specific in etiology and can be seen with hypoxic/ischemic injury, toxic/metabolic encephalopathies, post-ictal change, or medication effect.  The absence of epileptiform discharges does not rule out a clinical diagnosis of epilepsy.  Clinical correlation is advised.   Ellouise Newer, M.D.

## 2016-05-27 NOTE — Progress Notes (Signed)
PROGRESS NOTE    Kristi Kramer  HCW:[id number] DOB: 1946-05-04 DOA: 05/18/2016 PCP: Cyndi Bender, PA-C   Brief Narrative:  Kristi Kramer is a 70 y.o.WF PMHx  Anxiety, CVA with left-sided weakness, HTN, DM Type 2 uncontrolled with complications, COPD, OSA,   Brought to the ER after patient had a witnessed seizure. Patient's boyfriend saw the patient had tonic-clonic seizure. EMS was called and after EMS arrival patient had another tonic-clonic seizure and was given Midazolam. Patient was postictal in the ER on arrival. CT head is unremarkable. Neurologist was consulted and patient was placed on loading dose of Keppra. After neurological evaluation patient was placed on increased dose of Topamax which patient usually takes for headaches. On exam patient is still post ictal but follows commands and moves all extremities. Pupils are reacting. No further neurological exam possible since patient is still postictal.    Subjective: 11/7 alert sitting in bed confused, eating dinner,,Negative CP, negative SOB, negative HA    Assessment & Plan:   Principal Problem:   Seizures ([place]) Active Problems:   Type 2 diabetes mellitus (HCC)   Essential hypertension   OSA (obstructive sleep apnea)   Seizure (HCC)   Abnormal MRI   Polypharmacy   Controlled diabetes mellitus type 2 with complications ([place])   Palliative care encounter   Elevated troponin   Preoperative clearance   ESBL (extended spectrum beta-lactamase) producing bacteria infection   Brain tumor (HCC)  Seizures -EEG: Abnormal could be consistent with structural abnormality see results below  Brain metastases/Primary Brain Lymphoma -Received call from [facility]: Believe they may see metastases/masses on normal brain MRI.  -contrast MRI. 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 Newman Pies Neurosurgery has seen previously March 2017 -CT  Chest, Abdomen, Pelvis: Negative for neoplasm  -11/3 although patient's initial increased troponin most likely secondary to seizure, Neurosurgery has requested that cardiology cleared patient prior surgical brain biopsy Monday.  Neoplasm workup -CT abdomen, pelvis, chest negative neoplasm. See results below  Thoracic compression fracture -Chronic/age indeterminate fracture see results below.  Chronic systolic and diastolic CHF -Coreg 1.761 mg BID -Hydralazine PRN -Lisinopril 2.5 mg daily  Chest pain -Reproducible left thoracic rib pain consistent with patient's reported fall.  Stroke (history of) -CT scan showed old strokes see results below  OSA -CPAP per respiratory therapy  COPD -Currently asymptomatic monitor closely  HTN -Hydralazine PRN  Elevated troponins -Most likely secondary to seizures -Will trend per neurosurgery request. Cardiology consult placed per Neurosurgery request.   positive ESBL UTI -Fosfomycin per pharmacy  Diabetes type 2 controlled with, complications -Sensitive SSI  Hypokalemia -K-Dur 50 mEq 2 doses  Hypomagnesemia -Magnesium IV 2 g     Goals of care  -Obtain Palliative Care Consult: Patient and significant other (Ernie) would like to speak with member of palliative care team to discuss appointing primary and secondary HCPOA, CODE STATUS, short-term and long-term goals of care.    DVT prophylaxis: Lovenox Code Status: Full Family Communication:   Family present for discussion of plan of care Disposition Plan: Per neurosurgery   Consultants:  Dr.Karen Jacquen[facility] Dr. Newman Pies Neurosurgery pending    Procedures/Significant Events:  10/30 CT head WO contrast:-No acute intracranial pathology. - Small chronic infarcts at the high parietal lobes bilaterally. EEG - 10/30 - focal slowing over L hemisphere - frequent broad sharp waves over L frontopolar region  10/31 brain MRI:-New cerebral signal abnormality and  swelling  centered on the genu of the corpus callosum. Edematous change also present in the right cerebellum. DDX: Tumefactive demyelination, CNS lymphoma, or atypical infection are primary considerations. 11/1 MRI Brain w/wo Contrast; -Moderate to severe abnormal enhancement and signal abnormality within infiltrative appearance in both inferior frontal gyri, the genu of the corpus callosum, and separately in the right cerebellum. --Nonenhancing T2 signal abnormality extending into the right hypothalamus. Mild associated regional mass effect.  -Evolution and/or regression of signal abnormality in the left parietal and superior right frontal lobes since January 2017 and March 2016, respectively.  -Small foci of cortical encephalomalacia developed at both areas. 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  -Other top differential diagnosis are Atypical CNS Infection and Neurosarcoidosis.  11/2 CT chest, abdomen, pelvis W contrast:CT CHEST:   negative finding of neoplasm. -Old multiple mild and mild to moderate age indeterminate upper thoracic compression fractures. -Diverticulosis without acute diverticulitis. 11/5 Echocardiogram:- Left ventricle: mild LVH. Focal basal hypertrophy. -LVEF =40% to 45%. - (grade 1 diastolic dysfunction). - Regional wall motion abnormality: Hypokinesis of the mid-apical  anterior, basal-mid anteroseptal, apical inferior, apical septal,  apical lateral, and apical myocardium. 11/6Right suboccipital craniectomy for resection of right cerebellar tumor.     Cultures 10/30 MRSA by PCR negative 10/31 urine  positive ESBL    Antimicrobials: Rocephin 10/29 > 11/1 Fosfomycin 11/3 >>   Devices    LINES / TUBES:      Continuous Infusions:   Objective: Vitals:   05/27/16 0700 05/27/16 0800 05/27/16 1042 05/27/16 1346  BP: (!) 137/102  (!) 111/48 124/88  Pulse:   61 97  Resp: (!) '25  20 20  '$ Temp:  97.4 F (36.3 C) 97.3 F  (36.3 C) 98.8 F (37.1 C)  TempSrc:  Oral Oral Oral  SpO2: 96%  96%   Weight:      Height:        Intake/Output Summary (Last 24 hours) at 05/27/16 1655 Last data filed at 05/27/16 0800  Gross per 24 hour  Intake          1493.75 ml  Output             2010 ml  Net          -516.25 ml   Filed Weights   05/19/16 0958  Weight: 94.3 kg (208 lb)    Examination:  General: alert sitting in bed confused,, No acute respiratory distress Eyes: negative scleral hemorrhage, negative anisocoria, negative icterus ENT: Negative Runny nose, negative gingival bleeding, Neck:  Posterior neck multiple staples in place negative sign of infection Lungs: Clear to auscultation bilaterally without wheezes or crackles Cardiovascular: Regular rate and rhythm without murmur gallop or rub normal S1 and S2, left lateral thoracic tenderness to palpation (consistent with patient's report of fall) Abdomen: negative abdominal pain, nondistended, positive soft, bowel sounds, no rebound, no ascites, no appreciable mass Extremities: No significant cyanosis, clubbing, or edema bilateral lower extremities Skin: Negative rashes, lesions, ulcers Psychiatric:  Negative depression, negative anxiety, negative fatigue, negative mania  Central nervous system:  Cranial nerves II through XII intact, tongue/uvula midline, all extremities muscle strength 5/5, sensation intact throughout,negative dysarthria, negative expressive aphasia, negative receptive aphasia.  .     Data Reviewed: Care during the described time interval was provided by me .  I have reviewed this patient's available data, including medical history, events of note, physical examination, and all test results as part of my evaluation. I have personally reviewed  and interpreted all radiology studies.  CBC:  Recent Labs Lab 05/22/16 0255  WBC 7.8  HGB 12.5  HCT 36.2  MCV 84.0  PLT 366   Basic Metabolic Panel:  Recent Labs Lab 05/22/16 0255  05/23/16 1305 05/24/16 0021 05/25/16 0141 05/26/16 0217 05/27/16 0417  NA 136 135 136 139 138  --   K 2.8* 3.1* 3.3* 4.0 3.3*  --   CL 102 103 105 109 108  --   CO2 '25 23 23 23 '$ 21*  --   GLUCOSE 132* 131* 145* 133* 133*  --   BUN '8 10 11 14 15  '$ --   CREATININE 0.86 0.81 0.92 1.04* 0.82  --   CALCIUM 9.3 9.6 9.5 9.6 9.7  --   MG  --  1.6* 2.2 2.0 1.9 1.8   GFR: Estimated Creatinine Clearance: 74.5 mL/min (by C-G formula based on SCr of 0.82 mg/dL). Liver Function Tests: No results for input(s): AST, ALT, ALKPHOS, BILITOT, PROT, ALBUMIN in the last 168 hours. No results for input(s): LIPASE, AMYLASE in the last 168 hours. No results for input(s): AMMONIA in the last 168 hours. Coagulation Profile:  Recent Labs Lab 05/22/16 0255  INR 1.08   Cardiac Enzymes:  Recent Labs Lab 05/23/16 1305 05/23/16 1828 05/24/16 0021  TROPONINI 0.11* 0.10* 0.10*   BNP (last 3 results) No results for input(s): PROBNP in the last 8760 hours. HbA1C: No results for input(s): HGBA1C in the last 72 hours. CBG:  Recent Labs Lab 05/26/16 1720 05/26/16 2144 05/27/16 0750 05/27/16 1405 05/27/16 1610  GLUCAP 142* 217* 178* 213* 207*   Lipid Profile: No results for input(s): CHOL, HDL, LDLCALC, TRIG, CHOLHDL, LDLDIRECT in the last 72 hours. Thyroid Function Tests: No results for input(s): TSH, T4TOTAL, FREET4, T3FREE, THYROIDAB in the last 72 hours. Anemia Panel: No results for input(s): VITAMINB12, FOLATE, FERRITIN, TIBC, IRON, RETICCTPCT in the last 72 hours. Urine analysis:    Component Value Date/Time   COLORURINE YELLOW 05/19/2016 0245   APPEARANCEUR CLOUDY (A) 05/19/2016 0245   LABSPEC 1.019 05/19/2016 0245   PHURINE 6.0 05/19/2016 0245   GLUCOSEU NEGATIVE 05/19/2016 0245   HGBUR SMALL (A) 05/19/2016 0245   BILIRUBINUR NEGATIVE 05/19/2016 0245   KETONESUR NEGATIVE 05/19/2016 0245   PROTEINUR NEGATIVE 05/19/2016 0245   UROBILINOGEN 0.2 05/31/2015 1420   NITRITE POSITIVE (A)  05/19/2016 0245   LEUKOCYTESUR NEGATIVE 05/19/2016 0245   Sepsis Labs: '@LABRCNTIP'$ (procalcitonin:4,lacticidven:4)  ) Recent Results (from the past 240 hour(s))  Culture, Urine     Status: Abnormal   Collection Time: 05/19/16  2:45 AM  Result Value Ref Range Status   Specimen Description URINE, RANDOM  Final   Special Requests NONE  Final   Culture (A)  Final    >=100,000 COLONIES/mL ESCHERICHIA COLI Confirmed Extended Spectrum Beta-Lactamase Producer (ESBL)    Report Status 05/23/2016 FINAL  Final   Organism ID, Bacteria ESCHERICHIA COLI (A)  Final      Susceptibility   Escherichia coli - MIC*    AMPICILLIN >=32 RESISTANT Resistant     CEFAZOLIN >=64 RESISTANT Resistant     CEFTRIAXONE >=64 RESISTANT Resistant     CIPROFLOXACIN >=4 RESISTANT Resistant     GENTAMICIN <=1 SENSITIVE Sensitive     IMIPENEM <=0.25 SENSITIVE Sensitive     NITROFURANTOIN >=512 RESISTANT Resistant     TRIMETH/SULFA <=20 SENSITIVE Sensitive     AMPICILLIN/SULBACTAM >=32 RESISTANT Resistant     PIP/TAZO <=4 SENSITIVE Sensitive     Extended ESBL POSITIVE  Resistant     * >=100,000 COLONIES/mL ESCHERICHIA COLI  MRSA PCR Screening     Status: None   Collection Time: 05/19/16  9:52 AM  Result Value Ref Range Status   MRSA by PCR NEGATIVE NEGATIVE Final    Comment:        The GeneXpert MRSA Assay (FDA approved for NASAL specimens only), is one component of a comprehensive MRSA colonization surveillance program. It is not intended to diagnose MRSA infection nor to guide or monitor treatment for MRSA infections.   Culture, Urine     Status: Abnormal   Collection Time: 05/20/16 11:08 PM  Result Value Ref Range Status   Specimen Description URINE, RANDOM  Final   Special Requests NONE  Final   Culture (A)  Final    10,000 COLONIES/mL ESCHERICHIA COLI Confirmed Extended Spectrum Beta-Lactamase Producer (ESBL)    Report Status 05/23/2016 FINAL  Final   Organism ID, Bacteria ESCHERICHIA COLI (A)   Final      Susceptibility   Escherichia coli - MIC*    AMPICILLIN >=32 RESISTANT Resistant     CEFAZOLIN >=64 RESISTANT Resistant     CEFTRIAXONE >=64 RESISTANT Resistant     CIPROFLOXACIN >=4 RESISTANT Resistant     GENTAMICIN <=1 SENSITIVE Sensitive     IMIPENEM <=0.25 SENSITIVE Sensitive     NITROFURANTOIN >=512 RESISTANT Resistant     TRIMETH/SULFA <=20 SENSITIVE Sensitive     AMPICILLIN/SULBACTAM >=32 RESISTANT Resistant     PIP/TAZO <=4 SENSITIVE Sensitive     Extended ESBL POSITIVE Resistant     * 10,000 COLONIES/mL ESCHERICHIA COLI         Radiology Studies: Dg Chest Port 1 View  Result Date: 05/26/2016 CLINICAL DATA:  Post CVL insertion. EXAM: PORTABLE CHEST - 1 VIEW COMPARISON:  04/30/2016 FINDINGS: Right IJ Central venous catheter to the proximal SVC. No pneumothorax. Linear scarring in the left infrahilar region. Lungs otherwise clear. Heart size upper limits normal for technique. No effusion. Cervical fixation hardware noted. IMPRESSION: 1. Central line to SVC without pneumothorax. Electronically Signed   By: Lucrezia Europe M.D.   On: 05/26/2016 15:48        Scheduled Meds: . atorvastatin  20 mg Oral q1800  . carvedilol  3.125 mg Oral BID WC  . citalopram  20 mg Oral Daily  . dexamethasone  4 mg Intravenous Q6H   Followed by  . [START ON 05/28/2016] dexamethasone  4 mg Intravenous Q8H  . docusate sodium  100 mg Oral BID  . famotidine  20 mg Oral BID  . fluticasone  1-2 spray Each Nare BID  . insulin aspart  0-9 Units Subcutaneous TID WC  . levETIRAcetam  500 mg Oral BID  . lisinopril  2.5 mg Oral Daily  . pantoprazole  40 mg Oral Daily  . topiramate  75 mg Oral BID   Continuous Infusions:   LOS: 8 days    Time spent: 40 minutes    WOODS, Geraldo Docker, MD [facility] Pager [phone]   If 7PM-7AM, please contact night-coverage [email or web address] Password [email or web address] 05/27/2016, 4:55 PM

## 2016-05-27 NOTE — Progress Notes (Signed)
Pt restless at this time. Pulled off surgical dressing. Staple site clean and dry. Pt had to be redirected several times. Pt c/o headache administered pain medication for relief. Boyfriend at bedside. Safety measures in place. Call bell within reach. Will monitor.

## 2016-05-27 NOTE — Evaluation (Signed)
Physical Therapy Evaluation Patient Details Name: Kristi Kramer MRN: [id number] DOB: [birth year] Today's Date: 05/27/2016   History of Present Illness  Patient is a 70 y/o female with hx of CVA with residual left sided weakness as well as vision deficits, HTN, DM, COPD, sleep apnea, dyslipidemia presents with seizures x2 witnessed and found to have multiple intracranial lesions now s/p right suboccipital craniectomy for resection of cerebellar tumor. Concern for primary lymphoma.  Clinical Impression  Patient presents with generalized weakness, impaired balance, decreased safety awareness, impaired problem solving and confusion s/p above. Pt tolerated gait training with Min-mod A for balance/safety due to LOB during turns. Reports she uses rollator vs SPC at home. Pt reports she has 24/7 S from significant other who can help as needed-which she needs for safety. Pt continues to be high fall risk and has poor safety awareness as well as confusion. Will follow acutely to maximize independence and mobility prior to return home.    Follow Up Recommendations Home health PT;Supervision for mobility/OOB;Supervision/Assistance - 24 hour    Equipment Recommendations  None recommended by PT    Recommendations for Other Services OT consult;Speech consult     Precautions / Restrictions Precautions Precautions: Fall Restrictions Weight Bearing Restrictions: No      Mobility  Bed Mobility Overal bed mobility: Needs Assistance Bed Mobility: Supine to Sit     Supine to sit: Min guard;HOB elevated     General bed mobility comments: Effort to get to EOB, but no physical assist needed. Use of rail.   Transfers Overall transfer level: Needs assistance Equipment used: None Transfers: Sit to/from Stand Sit to Stand: Min guard         General transfer comment: Min guard to steady in standing.   Ambulation/Gait Ambulation/Gait assistance: Min assist Ambulation Distance (Feet): 60  Feet Assistive device: 1 person hand held assist Gait Pattern/deviations: Step-through pattern;Decreased stride length;Decreased dorsiflexion - left;Shuffle;Drifts right/left Gait velocity: decreased   General Gait Details: Slow, guarded and unsteady gait with pt reaching for rail for support; difficulty with turns with 1 LOB when trying to find room.   Stairs            Wheelchair Mobility    Modified Rankin (Stroke Patients Only)       Balance Overall balance assessment: Needs assistance;History of Falls Sitting-balance support: Feet supported;No upper extremity supported Sitting balance-Leahy Scale: Fair     Standing balance support: During functional activity   Standing balance comment: Able to stand statically without UE support but requires UE support during dynamic activities and gait.              High level balance activites: Head turns;Sudden stops;Turns High Level Balance Comments: LOB during head turns and turning around requiring mod A to prevent fall. Slow reaction time.             Pertinent Vitals/Pain Pain Assessment: Faces Faces Pain Scale: Hurts little more Pain Location: back and head Pain Descriptors / Indicators: Sore;Operative site guarding Pain Intervention(s): Monitored during session;Repositioned    Home Living Family/patient expects to be discharged to:: Private residence Living Arrangements: Spouse/significant other Available Help at Discharge: Available 24 hours/day;Friend(s) Type of Home: House Home Access: Stairs to enter   [employer] of Steps: 1-2 Home Layout: One level Home Equipment: Environmental consultant - 2 wheels;Walker - 4 wheels;Electric scooter;Shower seat;Cane - single point      Prior Function Level of Independence: Independent with assistive device(s)  Comments: Uses rollator vs SPC for household ambulation and scooter for community ambulation. Reports multiple falls. Difficult to get a history from as  pt changing answers throughout evaluation.     Hand Dominance   Dominant Hand: Right    Extremity/Trunk Assessment   Upper Extremity Assessment: Defer to OT evaluation           Lower Extremity Assessment: Generalized weakness;LLE deficits/detail   LLE Deficits / Details: Residual left sided weakness from prior CVA; Decreased DF.     Communication   Communication: No difficulties  Cognition Arousal/Alertness: Awake/alert Behavior During Therapy: Flat affect Overall Cognitive Status: Impaired/Different from baseline Area of Impairment: Orientation;Safety/judgement;Problem solving;Awareness;Following commands Orientation Level: Disoriented to;Time;Situation (States date is 65; March then corrects to November. When asked what she is here for states, "had some stuff in my head.")   Memory: Decreased short-term memory Following Commands: Follows multi-step commands inconsistently Safety/Judgement: Decreased awareness of safety;Decreased awareness of deficits Awareness: Emergent Problem Solving: Slow processing;Requires verbal cues General Comments: Difficulty answering questions related to PLOF- inconsistent answers throughout session. Reports falling on stairs at home but then later stating she practices stairs for exercise. Difficulty following multi step directional commands and hard time finding room on way back from walk-trying to walk into another pt's room after seeing him in the bed.     General Comments      Exercises     Assessment/Plan    PT Assessment Patient needs continued PT services  PT Problem List Decreased strength;Decreased mobility;Decreased safety awareness;Decreased cognition;Decreased activity tolerance;Pain;Decreased balance          PT Treatment Interventions DME instruction;Gait training;Therapeutic exercise;Patient/family education;Balance training;Functional mobility training;Stair training;Therapeutic activities;Cognitive  remediation;Neuromuscular re-education    PT Goals (Current goals can be found in the Care Plan section)  Acute Rehab PT Goals Patient Stated Goal: to go home PT Goal Formulation: With patient Time For Goal Achievement: 06/10/16 Potential to Achieve Goals: Good    Frequency Min 3X/week   Barriers to discharge        Co-evaluation               End of Session Equipment Utilized During Treatment: Gait belt Activity Tolerance: Patient tolerated treatment well Patient left: in bed;with call bell/phone within reach;with bed alarm set (sitting EOB.) Nurse Communication: Mobility status         Time: 3005-1102 PT Time Calculation (min) (ACUTE ONLY): 17 min   Charges:   PT Evaluation $PT Eval Moderate Complexity: 1 Procedure     PT G Codes:        Emmilee Reamer A Leeum Sankey 05/27/2016, 9:49 AM Wray Kearns, PT, DPT [phone]

## 2016-05-27 NOTE — Progress Notes (Signed)
Pt is on NIV at this time tolerating it well.  

## 2016-05-27 NOTE — Progress Notes (Signed)
EEG Completed; Results Pending  

## 2016-05-27 NOTE — Progress Notes (Signed)
Patient ID: Kristi Kramer, female   DOB: 06-01-1946, 70 y.o.   MRN: [id number] Subjective:  The patient is alert and pleasant. She is mildly confused. She is in no apparent distress. She wants to go home.  Objective: Vital signs in last 24 hours: Temp:  [97.5 F (36.4 C)-98.5 F (36.9 C)] 97.5 F (36.4 C) (11/07 0400) Pulse Rate:  [51-73] 61 (11/07 0500) Resp:  [12-30] 25 (11/07 0700) BP: (109-197)/(51-145) 137/102 (11/07 0700) SpO2:  [91 %-100 %] 95 % (11/07 0500) Arterial Line BP: (90-193)/(61-141) 115/111 (11/07 0200)  Intake/Output from previous day: 11/06 0701 - 11/07 0700 In: 2893.8 [P.O.:300; I.V.:2343.8; IV Piggyback:250] Out: 2080 [Urine:2070; Blood:10] Intake/Output this shift: No intake/output data recorded.  Physical exam the patient is alert and pleasant. She is oriented to person and a hospital. She is moving all 4 extremities well. By report her gait is unsteady. Her dressing is clean and dry.  Lab Results: No results for input(s): WBC, HGB, HCT, PLT in the last 72 hours. BMET  Recent Labs  05/25/16 0141 05/26/16 0217  NA 139 138  K 4.0 3.3*  CL 109 108  CO2 23 21*  GLUCOSE 133* 133*  BUN 14 15  CREATININE 1.04* 0.82  CALCIUM 9.6 9.7    Studies/Results: Dg Chest Port 1 View  Result Date: 05/26/2016 CLINICAL DATA:  Post CVL insertion. EXAM: PORTABLE CHEST - 1 VIEW COMPARISON:  04/30/2016 FINDINGS: Right IJ Central venous catheter to the proximal SVC. No pneumothorax. Linear scarring in the left infrahilar region. Lungs otherwise clear. Heart size upper limits normal for technique. No effusion. Cervical fixation hardware noted. IMPRESSION: 1. Central line to SVC without pneumothorax. Electronically Signed   By: Lucrezia Europe M.D.   On: 05/26/2016 15:48    Assessment/Plan: Postop day #1: The patient is doing well. We will mobilize her with PT. Hopefully she can go home tomorrow. We are awaiting final pathology.  LOS: 8 days     Osamah Schmader  D 05/27/2016, 7:44 AM

## 2016-05-28 LAB — BASIC METABOLIC PANEL
ANION GAP: 8 (ref 5–15)
BUN: 19 mg/dL (ref 6–20)
CHLORIDE: 111 mmol/L (ref 101–111)
CO2: 21 mmol/L — ABNORMAL LOW (ref 22–32)
Calcium: 10.3 mg/dL (ref 8.9–10.3)
Creatinine, Ser: 0.81 mg/dL (ref 0.44–1.00)
GFR calc Af Amer: >60 mL/min (ref >60)
GFR calc non Af Amer: >60 mL/min (ref >60)
GLUCOSE: 170 mg/dL — AB (ref 65–99)
POTASSIUM: 4.6 mmol/L (ref 3.5–5.1)
Sodium: 140 mmol/L (ref 135–145)

## 2016-05-28 LAB — GLUCOSE, CAPILLARY: Glucose-Capillary: 160 mg/dL — ABNORMAL HIGH (ref 65–99)

## 2016-05-28 LAB — MAGNESIUM: Magnesium: 1.9 mg/dL (ref 1.7–2.4)

## 2016-05-28 MED ORDER — LEVETIRACETAM 500 MG PO TABS: 500.0000 mg | ORAL_TABLET | Freq: Two times a day (BID) | ORAL | 1 refills | Status: AC

## 2016-05-28 MED ORDER — DEXAMETHASONE 2 MG PO TABS
2.0000 mg | ORAL_TABLET | Freq: Two times a day (BID) | ORAL | Status: DC
  Administered 2016-05-28: 2 mg via ORAL
  Filled 2016-05-28: qty 1

## 2016-05-28 MED ORDER — DEXAMETHASONE 2 MG PO TABS: 2.0000 mg | ORAL_TABLET | Freq: Two times a day (BID) | ORAL | 0 refills | Status: DC

## 2016-05-28 MED ORDER — HYDROCODONE-ACETAMINOPHEN 5-325 MG PO TABS: 1.0000 | ORAL_TABLET | ORAL | 0 refills | Status: DC | PRN

## 2016-05-28 NOTE — Discharge Summary (Signed)
Physician Discharge Summary  Patient ID: Kristi Kramer MRN: [id number] DOB/AGE: [birth year] 68 y.o.  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 Further workup demonstrated multiple intracranial lesions. A CT of the chest abdomen and pelvis did not demonstrate any primary lesions. The patient underwent a right suboccipital craniectomy for resection of her cerebellar lesion on 05/26/2016. The surgery went well. 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 Final diagnosis is pending.  On 05/28/2016 the patient requested discharge home. She has help at home. Arrangements were made for home physical therapy. She was instructed to follow-up with me in one week for staple removal and discussion of her final diagnosis. Arrangements will be made for outpatient oncological consultation. All her questions were answered.    Consults: Physical therapy Significant Diagnostic Studies: Brain MRI Treatments: Right suboccipital craniectomy for resection of tumor Discharge Exam: Blood pressure (!) 143/62, pulse 63, temperature 97.8 F (36.6 C), temperature source Oral, resp. rate 20, height '5\' 5"'$  (1.651 m), weight 94.3 kg (208 lb), SpO2 98 %. The patient is alert and pleasant. She is oriented to person and a hospital. Her strength is normal. Her speech is  normal. Her wound is healing well.  Disposition: Home with supervision  Discharge Instructions    Ambulatory referral to Neurology    Complete by:  As directed    An appointment is requested in approximately: 2 weeks   Call MD for:  difficulty breathing, headache or visual disturbances    Complete by:  As directed    Call MD for:  extreme fatigue    Complete by:  As directed    Call MD for:  hives    Complete by:  As directed    Call MD for:  persistant dizziness or light-headedness    Complete by:  As directed    Call MD for:  persistant nausea and vomiting    Complete by:  As directed    Call MD for:  redness, tenderness, or signs of infection (pain, swelling, redness, odor or green/yellow discharge around incision site)    Complete by:  As directed    Call MD for:  severe uncontrolled pain    Complete by:  As directed    Call MD for:  temperature >100.4    Complete by:  As directed    Diet - low sodium heart healthy    Complete by:  As directed    Discharge instructions    Complete by:  As directed    Call [phone] for a followup appointment. Take a stool softener while you are using pain medications.   Driving Restrictions    Complete by:  As directed    Do not drive .   Increase activity slowly    Complete by:  As directed    Lifting restrictions    Complete by:  As directed    Do not lift more than  5 pounds. No excessive bending or twisting.   May shower / Bathe    Complete by:  As directed    He may shower after the pain she is removed 3 days after surgery. Leave the incision alone.   No dressing needed    Complete by:  As directed        Medication List    STOP taking these medications   ALPRAZolam 0.5 MG tablet Commonly known as:  XANAX   promethazine 12.5 MG tablet Commonly known as:  PHENERGAN   traMADol 50 MG tablet Commonly known as:  ULTRAM     TAKE these medications   ADVAIR DISKUS 250-50 MCG/DOSE Aepb Generic drug:   Fluticasone-Salmeterol Inhale 1 puff into the lungs 2 (two) times daily.   albuterol (2.5 MG/3ML) 0.083% nebulizer solution Commonly known as:  PROVENTIL Take 2.5 mg by nebulization 3 (three) times daily as needed for wheezing or shortness of breath.   VENTOLIN HFA 108 (90 Base) MCG/ACT inhaler Generic drug:  albuterol Inhale 2 puffs into the lungs every 6 (six) hours as needed. wheezing   aspirin EC 81 MG tablet Take 81 mg by mouth daily.   atorvastatin 20 MG tablet Commonly known as:  LIPITOR Take 20 mg by mouth daily.   citalopram 20 MG tablet Commonly known as:  CELEXA Take 20 mg by mouth daily.   dexamethasone 2 MG tablet Commonly known as:  DECADRON Take 1 tablet (2 mg total) by mouth every 12 (twelve) hours.   esomeprazole 20 MG capsule Commonly known as:  NEXIUM Take 40 mg by mouth daily at 12 noon.   fluticasone 50 MCG/ACT nasal spray Commonly known as:  FLONASE Place 1-2 sprays into both nostrils 2 (two) times daily.   hydrochlorothiazide 25 MG tablet Commonly known as:  HYDRODIURIL Take 25 mg by mouth daily.   HYDROcodone-acetaminophen 5-325 MG tablet Commonly known as:  NORCO/VICODIN Take 1-2 tablets by mouth every 4 (four) hours as needed for moderate pain.   levETIRAcetam 500 MG tablet Commonly known as:  KEPPRA Take 1 tablet (500 mg total) by mouth 2 (two) times daily.   lidocaine 5 % Commonly known as:  LIDODERM Place 1 patch onto the skin daily as needed (pain). Remove & Discard patch within 12 hours or as directed by MD   meloxicam 7.5 MG tablet Commonly known as:  MOBIC Take 7.5 mg by mouth daily.   metFORMIN 1000 MG tablet Commonly known as:  GLUCOPHAGE Take 1,000 mg by mouth 2 (two) times daily with a meal.   phenazopyridine 100 MG tablet Commonly known as:  PYRIDIUM Take 1 tablet (100 mg total) by mouth 3 (three) times daily as needed for pain.   topiramate 50 MG tablet Commonly known as:  TOPAMAX Take 1 tablet (50 mg total) by  mouth daily. What changed:  when to take this  reasons to take this   Vitamin D (Ergocalciferol) 50000 units Caps capsule Commonly known as:  DRISDOL Take 50,000 Units by mouth every 7 (seven) days.      Follow-up Information    Go to Dr. Frederich Cha.   Why:   Arrived at [facility] on May 26, 2016 @ 8:15am. Report to preadmissions. Nothing to eat or drink by mouth after midnight and the night before.  Contact information: [place] [address] [phone]           Signed: Ophelia Charter 05/28/2016, 9:10 AM

## 2016-05-28 NOTE — Progress Notes (Signed)
Nutrition Brief Note Pt transferred to [facility] yesterday. Pt had an old malnutrition screening score of 5, but had no new score since admission. RD screening patient today and pt had score of 0.   Wt Readings from Last 15 Encounters:  05/19/16 208 lb (94.3 kg)  12/27/15 184 lb (83.5 kg)  10/18/15 202 lb 8 oz (91.9 kg)  09/28/15 207 lb 4.8 oz (94 kg)  09/20/15 205 lb (93 kg)  08/16/15 194 lb (88 kg)  08/14/15 194 lb 4.8 oz (88.1 kg)  09/27/13 226 lb (102.5 kg)  06/24/13 226 lb (102.5 kg)  06/09/13 223 lb 12.8 oz (101.5 kg)  05/23/13 223 lb (101.2 kg)  07/24/11 197 lb 8.5 oz (89.6 kg)    Body mass index is 34.61 kg/m. Patient meets criteria for Obesity based on current BMI.   Current diet order is Carb Modified, patient is consuming approximately 100% of meals at this time per pt report. She reports having a good appetite and eating normally. She denies any questions or nutrition education needs at this time. Labs and medications reviewed.   No nutrition interventions warranted at this time. If nutrition issues arise, please consult RD.   Scarlette Ar RD, CSP, LDN Inpatient Clinical Dietitian Pager: [phone] After Hours Pager: [phone]

## 2016-05-28 NOTE — Care Management Note (Signed)
Case Management Note  Patient Details  Name: Kristi Kramer MRN: [id number] Date of Birth: Mar 01, 1946  Subjective/Objective:                    Action/Plan: Patient discharging home with orders for [facility] services. CM met with the patient and provided her a list of [place] agencies in [place]. She chose [facility] stating she has used them in the past. Pt also states her boyfriend is will her a majority of the time and he can provide transportation home. CM informed Levada Dy with [facility] and she accepted the referral.  Pts boyfriend up to the room. CM informed him of the choice for [facility] services and he confirmed that he is able to provide supervision for the patient. Will update the bedside RN.   Expected Discharge Date:                  Expected Discharge Plan:  Home/Self Care  In-House Referral:     Discharge planning Services  CM Consult  Post Acute Care Choice:    Choice offered to:  Patient, Spouse  DME Arranged:    DME Agency:     HH Arranged:  RN, PT [place] Agency:  [facility]  Status of Service:  Completed, signed off  If discussed at [place] of Stay Meetings, dates discussed:    Additional Comments:  Pollie Friar, RN 05/28/2016, 10:57 AM

## 2016-05-28 NOTE — Consult Note (Signed)
   [facility] CM Inpatient Consult   05/28/2016  Kristi Kramer 01/28/1946 [id number]  Patient was screened for Medicare ACO Registry with 2nd admission in 6 months with HX of COPD/DM, and CVA.  Came by to speak with patient and she had already discharged per staff.  Chart review reveals SP craniotomy for brain tumor and seizure. [place] home with home health ordered. Will sign off at this point.  Natividad Brood, RN BSN [facility] Liaison  [phone] business mobile phone Toll free office [phone]

## 2016-05-29 DIAGNOSIS — Z9181 History of falling: Secondary | ICD-10-CM | POA: Diagnosis not present

## 2016-05-29 DIAGNOSIS — F419 Anxiety disorder, unspecified: Secondary | ICD-10-CM | POA: Diagnosis not present

## 2016-05-29 DIAGNOSIS — Z7984 Long term (current) use of oral hypoglycemic drugs: Secondary | ICD-10-CM | POA: Diagnosis not present

## 2016-05-29 DIAGNOSIS — E119 Type 2 diabetes mellitus without complications: Secondary | ICD-10-CM | POA: Diagnosis not present

## 2016-05-29 DIAGNOSIS — I110 Hypertensive heart disease with heart failure: Secondary | ICD-10-CM | POA: Diagnosis not present

## 2016-05-29 DIAGNOSIS — I5042 Chronic combined systolic (congestive) and diastolic (congestive) heart failure: Secondary | ICD-10-CM | POA: Diagnosis not present

## 2016-05-29 DIAGNOSIS — I69354 Hemiplegia and hemiparesis following cerebral infarction affecting left non-dominant side: Secondary | ICD-10-CM | POA: Diagnosis not present

## 2016-05-29 DIAGNOSIS — F17210 Nicotine dependence, cigarettes, uncomplicated: Secondary | ICD-10-CM | POA: Diagnosis not present

## 2016-05-29 DIAGNOSIS — M542 Cervicalgia: Secondary | ICD-10-CM | POA: Diagnosis not present

## 2016-05-29 DIAGNOSIS — C8519 Unspecified B-cell lymphoma, extranodal and solid organ sites: Secondary | ICD-10-CM | POA: Diagnosis not present

## 2016-05-29 DIAGNOSIS — J449 Chronic obstructive pulmonary disease, unspecified: Secondary | ICD-10-CM | POA: Diagnosis not present

## 2016-05-29 DIAGNOSIS — M4854XD Collapsed vertebra, not elsewhere classified, thoracic region, subsequent encounter for fracture with routine healing: Secondary | ICD-10-CM | POA: Diagnosis not present

## 2016-05-29 DIAGNOSIS — Z483 Aftercare following surgery for neoplasm: Secondary | ICD-10-CM | POA: Diagnosis not present

## 2016-05-29 DIAGNOSIS — G8929 Other chronic pain: Secondary | ICD-10-CM | POA: Diagnosis not present

## 2016-05-29 DIAGNOSIS — G4733 Obstructive sleep apnea (adult) (pediatric): Secondary | ICD-10-CM | POA: Diagnosis not present

## 2016-05-29 DIAGNOSIS — G4089 Other seizures: Secondary | ICD-10-CM | POA: Diagnosis not present

## 2016-05-29 DIAGNOSIS — D869 Sarcoidosis, unspecified: Secondary | ICD-10-CM | POA: Diagnosis not present

## 2016-05-29 DIAGNOSIS — I951 Orthostatic hypotension: Secondary | ICD-10-CM | POA: Diagnosis not present

## 2016-05-29 DIAGNOSIS — Z792 Long term (current) use of antibiotics: Secondary | ICD-10-CM | POA: Diagnosis not present

## 2016-05-29 DIAGNOSIS — M1991 Primary osteoarthritis, unspecified site: Secondary | ICD-10-CM | POA: Diagnosis not present

## 2016-05-29 DIAGNOSIS — S5012XD Contusion of left forearm, subsequent encounter: Secondary | ICD-10-CM | POA: Diagnosis not present

## 2016-05-30 DIAGNOSIS — M4854XD Collapsed vertebra, not elsewhere classified, thoracic region, subsequent encounter for fracture with routine healing: Secondary | ICD-10-CM | POA: Diagnosis not present

## 2016-05-30 DIAGNOSIS — G4089 Other seizures: Secondary | ICD-10-CM | POA: Diagnosis not present

## 2016-05-30 DIAGNOSIS — Z483 Aftercare following surgery for neoplasm: Secondary | ICD-10-CM | POA: Diagnosis not present

## 2016-05-30 DIAGNOSIS — I110 Hypertensive heart disease with heart failure: Secondary | ICD-10-CM | POA: Diagnosis not present

## 2016-05-30 DIAGNOSIS — C8519 Unspecified B-cell lymphoma, extranodal and solid organ sites: Secondary | ICD-10-CM | POA: Diagnosis not present

## 2016-05-30 DIAGNOSIS — E119 Type 2 diabetes mellitus without complications: Secondary | ICD-10-CM | POA: Diagnosis not present

## 2016-06-02 DIAGNOSIS — Z483 Aftercare following surgery for neoplasm: Secondary | ICD-10-CM | POA: Diagnosis not present

## 2016-06-02 DIAGNOSIS — I110 Hypertensive heart disease with heart failure: Secondary | ICD-10-CM | POA: Diagnosis not present

## 2016-06-02 DIAGNOSIS — M4854XD Collapsed vertebra, not elsewhere classified, thoracic region, subsequent encounter for fracture with routine healing: Secondary | ICD-10-CM | POA: Diagnosis not present

## 2016-06-02 DIAGNOSIS — E119 Type 2 diabetes mellitus without complications: Secondary | ICD-10-CM | POA: Diagnosis not present

## 2016-06-02 DIAGNOSIS — C8519 Unspecified B-cell lymphoma, extranodal and solid organ sites: Secondary | ICD-10-CM | POA: Diagnosis not present

## 2016-06-02 DIAGNOSIS — G4089 Other seizures: Secondary | ICD-10-CM | POA: Diagnosis not present

## 2016-06-03 ENCOUNTER — Telehealth: Payer: Self-pay | Admitting: Neurology

## 2016-06-03 DIAGNOSIS — Z483 Aftercare following surgery for neoplasm: Secondary | ICD-10-CM | POA: Diagnosis not present

## 2016-06-03 DIAGNOSIS — C8519 Unspecified B-cell lymphoma, extranodal and solid organ sites: Secondary | ICD-10-CM | POA: Diagnosis not present

## 2016-06-03 DIAGNOSIS — G4089 Other seizures: Secondary | ICD-10-CM | POA: Diagnosis not present

## 2016-06-03 DIAGNOSIS — E119 Type 2 diabetes mellitus without complications: Secondary | ICD-10-CM | POA: Diagnosis not present

## 2016-06-03 DIAGNOSIS — I110 Hypertensive heart disease with heart failure: Secondary | ICD-10-CM | POA: Diagnosis not present

## 2016-06-03 DIAGNOSIS — M4854XD Collapsed vertebra, not elsewhere classified, thoracic region, subsequent encounter for fracture with routine healing: Secondary | ICD-10-CM | POA: Diagnosis not present

## 2016-06-03 LAB — BETA-2-GLYCOPROTEIN I ABS, IGG/M/A
Beta-2-Glycoprotein I IgA: <9 GPI IgA units (ref 0–25)
Beta-2-Glycoprotein I IgM: <9 GPI IgM units (ref 0–32)

## 2016-06-03 NOTE — Telephone Encounter (Signed)
Spoke to Kristi Kramer via speaker phone. Answered all questions to the best of my ability.

## 2016-06-03 NOTE — Telephone Encounter (Signed)
Kristi Kramer 06/20/1946. # [phone] ask for [facility]. She is [name]'s friend. This is regarding an appointment that Dr. Tomi Likens needed her to make. She said [name] is confused and Cognitively not all there. She is trying to get some appointments in order. Thank you

## 2016-06-04 DIAGNOSIS — Z483 Aftercare following surgery for neoplasm: Secondary | ICD-10-CM | POA: Diagnosis not present

## 2016-06-04 DIAGNOSIS — I110 Hypertensive heart disease with heart failure: Secondary | ICD-10-CM | POA: Diagnosis not present

## 2016-06-04 DIAGNOSIS — M4854XD Collapsed vertebra, not elsewhere classified, thoracic region, subsequent encounter for fracture with routine healing: Secondary | ICD-10-CM | POA: Diagnosis not present

## 2016-06-04 DIAGNOSIS — C8519 Unspecified B-cell lymphoma, extranodal and solid organ sites: Secondary | ICD-10-CM | POA: Diagnosis not present

## 2016-06-04 DIAGNOSIS — E119 Type 2 diabetes mellitus without complications: Secondary | ICD-10-CM | POA: Diagnosis not present

## 2016-06-04 DIAGNOSIS — G4089 Other seizures: Secondary | ICD-10-CM | POA: Diagnosis not present

## 2016-06-05 ENCOUNTER — Ambulatory Visit (INDEPENDENT_AMBULATORY_CARE_PROVIDER_SITE_OTHER): Payer: Medicare Other | Admitting: Neurology

## 2016-06-05 ENCOUNTER — Encounter: Payer: Self-pay | Admitting: Neurology

## 2016-06-05 VITALS — BP 132/58 | HR 87

## 2016-06-05 DIAGNOSIS — Z483 Aftercare following surgery for neoplasm: Secondary | ICD-10-CM | POA: Diagnosis not present

## 2016-06-05 DIAGNOSIS — R569 Unspecified convulsions: Secondary | ICD-10-CM | POA: Diagnosis not present

## 2016-06-05 DIAGNOSIS — I110 Hypertensive heart disease with heart failure: Secondary | ICD-10-CM | POA: Diagnosis not present

## 2016-06-05 DIAGNOSIS — E119 Type 2 diabetes mellitus without complications: Secondary | ICD-10-CM | POA: Diagnosis not present

## 2016-06-05 DIAGNOSIS — I639 Cerebral infarction, unspecified: Secondary | ICD-10-CM | POA: Diagnosis not present

## 2016-06-05 DIAGNOSIS — M4854XD Collapsed vertebra, not elsewhere classified, thoracic region, subsequent encounter for fracture with routine healing: Secondary | ICD-10-CM | POA: Diagnosis not present

## 2016-06-05 DIAGNOSIS — C8519 Unspecified B-cell lymphoma, extranodal and solid organ sites: Secondary | ICD-10-CM | POA: Diagnosis not present

## 2016-06-05 DIAGNOSIS — G4089 Other seizures: Secondary | ICD-10-CM | POA: Diagnosis not present
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
## 2016-06-05 NOTE — Patient Instructions (Signed)
1.   Take topiramate '50mg'$  at bedtime    Take levetiracetam '500mg'$  twice daily (one in morning and one at night)       Do not stop taking the medicine without talking to your doctor first, even if you have not had a seizure in a long time.   2. Avoid activities in which a seizure would cause danger to yourself or to others.  Don't operate dangerous machinery, swim alone, or climb in high or dangerous places, such as on ladders, roofs, or girders.  Do not drive unless your doctor says you may.  3. If you have any warning that you may have a seizure, lay down in a safe place where you can't hurt yourself.    4.  No driving  5.  Contact your doctor if you have any problems that may be related to the medicine you are taking.  6.  Call 911 and bring the patient back to the ED if:        A.  The seizure lasts longer than 5 minutes.       B.  The patient doesn't awaken shortly after the seizure  C.  The patient has new problems such as difficulty seeing, speaking or moving  D.  The patient was injured during the seizure  E.  The patient has a temperature over 102 F (39C)  F.  The patient vomited and now is having trouble breathing 7.  Follow up in 3 months.

## 2016-06-08 DIAGNOSIS — Z483 Aftercare following surgery for neoplasm: Secondary | ICD-10-CM | POA: Diagnosis not present

## 2016-06-08 DIAGNOSIS — M4854XD Collapsed vertebra, not elsewhere classified, thoracic region, subsequent encounter for fracture with routine healing: Secondary | ICD-10-CM | POA: Diagnosis not present

## 2016-06-08 DIAGNOSIS — C8519 Unspecified B-cell lymphoma, extranodal and solid organ sites: Secondary | ICD-10-CM | POA: Diagnosis not present

## 2016-06-08 DIAGNOSIS — I110 Hypertensive heart disease with heart failure: Secondary | ICD-10-CM | POA: Diagnosis not present

## 2016-06-08 DIAGNOSIS — E119 Type 2 diabetes mellitus without complications: Secondary | ICD-10-CM | POA: Diagnosis not present

## 2016-06-08 DIAGNOSIS — G4089 Other seizures: Secondary | ICD-10-CM | POA: Diagnosis not present

## 2016-06-10 DIAGNOSIS — E119 Type 2 diabetes mellitus without complications: Secondary | ICD-10-CM | POA: Diagnosis not present

## 2016-06-10 DIAGNOSIS — I110 Hypertensive heart disease with heart failure: Secondary | ICD-10-CM | POA: Diagnosis not present

## 2016-06-10 DIAGNOSIS — C8519 Unspecified B-cell lymphoma, extranodal and solid organ sites: Secondary | ICD-10-CM | POA: Diagnosis not present

## 2016-06-10 DIAGNOSIS — G4089 Other seizures: Secondary | ICD-10-CM | POA: Diagnosis not present

## 2016-06-10 DIAGNOSIS — M4854XD Collapsed vertebra, not elsewhere classified, thoracic region, subsequent encounter for fracture with routine healing: Secondary | ICD-10-CM | POA: Diagnosis not present

## 2016-06-10 DIAGNOSIS — Z483 Aftercare following surgery for neoplasm: Secondary | ICD-10-CM | POA: Diagnosis not present

## 2016-06-11 DIAGNOSIS — E119 Type 2 diabetes mellitus without complications: Secondary | ICD-10-CM | POA: Diagnosis not present

## 2016-06-11 DIAGNOSIS — G4089 Other seizures: Secondary | ICD-10-CM | POA: Diagnosis not present

## 2016-06-11 DIAGNOSIS — Z483 Aftercare following surgery for neoplasm: Secondary | ICD-10-CM | POA: Diagnosis not present

## 2016-06-11 DIAGNOSIS — C8519 Unspecified B-cell lymphoma, extranodal and solid organ sites: Secondary | ICD-10-CM | POA: Diagnosis not present

## 2016-06-11 DIAGNOSIS — M4854XD Collapsed vertebra, not elsewhere classified, thoracic region, subsequent encounter for fracture with routine healing: Secondary | ICD-10-CM | POA: Diagnosis not present

## 2016-06-11 DIAGNOSIS — I110 Hypertensive heart disease with heart failure: Secondary | ICD-10-CM | POA: Diagnosis not present

## 2016-06-16 DIAGNOSIS — I110 Hypertensive heart disease with heart failure: Secondary | ICD-10-CM | POA: Diagnosis not present

## 2016-06-16 DIAGNOSIS — Z483 Aftercare following surgery for neoplasm: Secondary | ICD-10-CM | POA: Diagnosis not present

## 2016-06-16 DIAGNOSIS — E119 Type 2 diabetes mellitus without complications: Secondary | ICD-10-CM | POA: Diagnosis not present

## 2016-06-16 DIAGNOSIS — G4089 Other seizures: Secondary | ICD-10-CM | POA: Diagnosis not present

## 2016-06-16 DIAGNOSIS — M4854XD Collapsed vertebra, not elsewhere classified, thoracic region, subsequent encounter for fracture with routine healing: Secondary | ICD-10-CM | POA: Diagnosis not present

## 2016-06-16 DIAGNOSIS — C8519 Unspecified B-cell lymphoma, extranodal and solid organ sites: Secondary | ICD-10-CM | POA: Diagnosis not present

## 2016-06-18 DIAGNOSIS — C8330 Diffuse large B-cell lymphoma, unspecified site: Secondary | ICD-10-CM | POA: Insufficient documentation

## 2016-06-18 NOTE — Progress Notes (Signed)
[place]  [phone] [phone]  ID: Kristi Kramer OB: 11/21/1945  MR#: [id number]  GXQ#:[id number]  Patient Care Team: Cyndi Bender, PA-C as PCP - General (Physician Assistant)  CHIEF COMPLAINT: Diffuse large B-cell lymphoma of brain.  INTERVAL HISTORY: Patient is an 70 year old female who was recently diagnosed by brain biopsy with primary CNS diffuse large B-cell lymphoma. Patient's symptoms started in January 2017 which what appeared to be a CVA. More recently patient was found to be having seizures which led to her recent biopsy. Currently she is weak and fatigued but otherwise feels well. She has no new neurologic complaints. She denies any fevers, chills, night sweats, or weight loss. She has no chest pain or shortness of breath. She denies any nausea, vomiting, constipation, or diarrhea. She has no urinary complaints. Patient otherwise feels well and offers no further specific complaints.  REVIEW OF SYSTEMS:   Review of Systems  Constitutional: Positive for malaise/fatigue. Negative for chills, diaphoresis, fever and weight loss.  Respiratory: Negative.  Negative for cough and shortness of breath.   Cardiovascular: Negative.  Negative for chest pain and leg swelling.  Gastrointestinal: Negative.  Negative for abdominal pain.  Genitourinary: Negative.   Musculoskeletal: Negative.   Neurological: Positive for weakness.    As per HPI. Otherwise, a complete review of systems is negative.  PAST MEDICAL HISTORY: Past Medical History:  Diagnosis Date  . Anxiety   . Arthritis   . Asthma   . Cataract   . COPD (chronic obstructive pulmonary disease) (HCC)    sarcoidosis  . Depression   . Diabetes mellitus   . GERD (gastroesophageal reflux disease)   . High cholesterol   . Hypertension   . Insomnia   . Migraine   . Shortness of breath dyspnea   . Sleep apnea    cpap  . 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 ABDOMINAL HYSTERECTOMY    . APPENDECTOMY    . BACK SURGERY    . CHOLECYSTECTOMY    . COLONOSCOPY    . CRANIOTOMY N/A 05/26/2016   Procedure: CRANIOTOMY TUMOR EXCISION;  Surgeon: Newman Pies, MD;  Location: [facility];  Service: Neurosurgery;  Laterality: N/A;  CRANIOTOMY TUMOR EXCISION  . EAR EXAMINATION UNDER ANESTHESIA Left    plate  . NECK SURGERY    . RADIOLOGY WITH ANESTHESIA N/A 05/21/2016   Procedure: RADIOLOGY WITH ANESTHESIA - MRI;  Surgeon: Medication Radiologist, MD;  Location: [facility];  Service: Radiology;  Laterality: N/A;    FAMILY HISTORY: Family History  Problem Relation Age of Onset  . Liver cancer Father   . Heart disease Sister   . Other Mother     blood clots  . Diabetes Mother   . Heart disease Mother     ADVANCED DIRECTIVES (Y/N):  N  HEALTH MAINTENANCE: Social History  Substance Use Topics  . Smoking status: Light Tobacco Smoker    Packs/day: 0.25    Years: 22.00    Types: Cigarettes  . Smokeless tobacco: Never Used     Comment: wants patches to quit  . Alcohol use No     Colonoscopy:  PAP:  Bone density:  Lipid panel:  Allergies  Allergen Reactions  . Mometasone Anaphylaxis and Rash  . Codeine Nausea And Vomiting  . Sulfa Antibiotics Nausea And Vomiting    Current Outpatient Prescriptions  Medication Sig Dispense Refill  .  ADVAIR DISKUS 250-50 MCG/DOSE AEPB Inhale 1 puff into the lungs 2 (two) times daily.  11  . albuterol (PROVENTIL) (2.5 MG/3ML) 0.083% nebulizer solution Take 2.5 mg by nebulization 3 (three) times daily as needed for wheezing or shortness of breath.    Marland Kitchen aspirin EC 81 MG tablet Take 81 mg by mouth daily.     Marland Kitchen atorvastatin (LIPITOR) 20 MG tablet Take 20 mg by mouth daily.   1  . citalopram (CELEXA) 20 MG tablet Take 20 mg by mouth daily.    Marland Kitchen dexamethasone (DECADRON) 2 MG tablet Take 1 tablet (2 mg total) by mouth every 12 (twelve) hours. 60 tablet 0  . esomeprazole  (NEXIUM) 20 MG capsule Take 20 mg by mouth daily at 12 noon.     . fluticasone (FLONASE) 50 MCG/ACT nasal spray Place 1-2 sprays into both nostrils 2 (two) times daily.    . hydrochlorothiazide (HYDRODIURIL) 25 MG tablet Take 25 mg by mouth daily.    Marland Kitchen HYDROcodone-acetaminophen (NORCO/VICODIN) 5-325 MG tablet Take 1-2 tablets by mouth every 4 (four) hours as needed for moderate pain. 50 tablet 0  . levETIRAcetam (KEPPRA) 500 MG tablet Take 1 tablet (500 mg total) by mouth 2 (two) times daily. 60 tablet 1  . lidocaine (LIDODERM) 5 % Place 1 patch onto the skin daily as needed (pain). Remove & Discard patch within 12 hours or as directed by MD    . meloxicam (MOBIC) 7.5 MG tablet Take 7.5 mg by mouth daily.    . metFORMIN (GLUCOPHAGE) 1000 MG tablet Take 1,000 mg by mouth 2 (two) times daily with a meal.   3  . phenazopyridine (PYRIDIUM) 100 MG tablet Take 1 tablet (100 mg total) by mouth 3 (three) times daily as needed for pain. 10 tablet 0  . topiramate (TOPAMAX) 50 MG tablet Take 1 tablet (50 mg total) by mouth daily. 30 tablet 3  . VENTOLIN HFA 108 (90 BASE) MCG/ACT inhaler Inhale 2 puffs into the lungs every 6 (six) hours as needed. wheezing  3  . Vitamin D, Ergocalciferol, (DRISDOL) 50000 units CAPS capsule Take 50,000 Units by mouth every 7 (seven) days.     No current facility-administered medications for this visit.     OBJECTIVE: Vitals:   06/19/16 1544  BP: 121/85  Pulse: 93  Resp: 18  Temp: (!) 96.1 F (35.6 C)     Body mass index is 28.47 kg/m.    ECOG FS:2 - Symptomatic, <50% confined to bed  General: Well-developed, well-nourished, no acute distress. Eyes: Pink conjunctiva, anicteric sclera. HEENT: Normocephalic, moist mucous membranes, clear oropharnyx. Lungs: Clear to auscultation bilaterally. Heart: Regular rate and rhythm. No rubs, murmurs, or gallops. Abdomen: Soft, nontender, nondistended. No organomegaly noted, normoactive bowel sounds. Musculoskeletal: No edema,  cyanosis, or clubbing. Neuro: Alert, answering all questions appropriately. Cranial nerves grossly intact. Skin: No rashes or petechiae noted. Psych: Normal affect. Lymphatics: No cervical, calvicular, axillary or inguinal LAD.   LAB RESULTS:  Lab Results  Component Value Date   NA 140 05/28/2016   K 4.6 05/28/2016   CL 111 05/28/2016   CO2 21 (L) 05/28/2016   GLUCOSE 170 (H) 05/28/2016   BUN 19 05/28/2016   CREATININE 0.81 05/28/2016   CALCIUM 10.3 05/28/2016   PROT 5.9 (L) 05/20/2016   ALBUMIN 3.1 (L) 05/20/2016   AST 29 05/20/2016   ALT 13 (L) 05/20/2016   ALKPHOS 69 05/20/2016   BILITOT 0.9 05/20/2016   GFRNONAA >60 05/28/2016   GFRAA >  60 05/28/2016    Lab Results  Component Value Date   WBC 7.8 05/22/2016   NEUTROABS 11.1 (H) 05/19/2016   HGB 12.5 05/22/2016   HCT 36.2 05/22/2016   MCV 84.0 05/22/2016   PLT 240 05/22/2016     STUDIES: Dg Chest Port 1 View  Result Date: 05/26/2016 CLINICAL DATA:  Post CVL insertion. EXAM: PORTABLE CHEST - 1 VIEW COMPARISON:  04/30/2016 FINDINGS: Right IJ Central venous catheter to the proximal SVC. No pneumothorax. Linear scarring in the left infrahilar region. Lungs otherwise clear. Heart size upper limits normal for technique. No effusion. Cervical fixation hardware noted. IMPRESSION: 1. Central line to SVC without pneumothorax. Electronically Signed   By: Lucrezia Europe M.D.   On: 05/26/2016 15:48    ASSESSMENT: Diffuse large B-cell lymphoma of brain.  PLAN:    1. 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 She was placed on steroids at that time with near resolution of her brain mass. No biopsy could be performed. Patient was then monitored for approximately 3 months until she had recent seizures. Imaging did not reveal any other systemic disease. Patient has not had a bone marrow biopsy. Will get a  PET scan, bone marrow biopsy, and lumbar puncture in the next 1-2 weeks to assess the extent of her disease. Patient will then return to clinic today discuss the results and treatment planning. Previously, case was discussed with radiation oncology who did not feel XRT was appropriate at this time.  Approximately 45 minutes was spent in discussion of which greater than 50% was consultation.  Patient expressed understanding and was in agreement with this plan. She also understands that She can call clinic at any time with any questions, concerns, or complaints.   No matching staging information was found for the patient.  Lloyd Huger, MD   06/23/2016 2:50 PM

## 2016-06-19 ENCOUNTER — Encounter: Payer: Self-pay | Admitting: Oncology

## 2016-06-19 ENCOUNTER — Inpatient Hospital Stay: Payer: Medicare Other | Attending: Oncology | Admitting: Oncology

## 2016-06-19 VITALS — BP 121/85 | HR 93 | Temp 96.1°F | Resp 18 | Wt 171.1 lb

## 2016-06-19 DIAGNOSIS — G4700 Insomnia, unspecified: Secondary | ICD-10-CM | POA: Insufficient documentation

## 2016-06-19 DIAGNOSIS — D869 Sarcoidosis, unspecified: Secondary | ICD-10-CM | POA: Diagnosis not present

## 2016-06-19 DIAGNOSIS — R531 Weakness: Secondary | ICD-10-CM

## 2016-06-19 DIAGNOSIS — C8331 Diffuse large B-cell lymphoma, lymph nodes of head, face, and neck: Secondary | ICD-10-CM | POA: Diagnosis not present

## 2016-06-19 DIAGNOSIS — Z800 Family history of malignant neoplasm of digestive organs: Secondary | ICD-10-CM | POA: Insufficient documentation

## 2016-06-19 DIAGNOSIS — E119 Type 2 diabetes mellitus without complications: Secondary | ICD-10-CM

## 2016-06-19 DIAGNOSIS — R0602 Shortness of breath: Secondary | ICD-10-CM | POA: Insufficient documentation

## 2016-06-19 DIAGNOSIS — Z8673 Personal history of transient ischemic attack (TIA), and cerebral infarction without residual deficits: Secondary | ICD-10-CM | POA: Diagnosis not present

## 2016-06-19 DIAGNOSIS — I10 Essential (primary) hypertension: Secondary | ICD-10-CM | POA: Insufficient documentation

## 2016-06-19 DIAGNOSIS — M129 Arthropathy, unspecified: Secondary | ICD-10-CM | POA: Diagnosis not present

## 2016-06-19 DIAGNOSIS — F329 Major depressive disorder, single episode, unspecified: Secondary | ICD-10-CM | POA: Diagnosis not present

## 2016-06-19 DIAGNOSIS — K219 Gastro-esophageal reflux disease without esophagitis: Secondary | ICD-10-CM | POA: Diagnosis not present

## 2016-06-19 DIAGNOSIS — J449 Chronic obstructive pulmonary disease, unspecified: Secondary | ICD-10-CM | POA: Insufficient documentation

## 2016-06-19 DIAGNOSIS — Z7984 Long term (current) use of oral hypoglycemic drugs: Secondary | ICD-10-CM | POA: Diagnosis not present

## 2016-06-19 DIAGNOSIS — Z79899 Other long term (current) drug therapy: Secondary | ICD-10-CM | POA: Diagnosis not present

## 2016-06-19 DIAGNOSIS — Z8669 Personal history of other diseases of the nervous system and sense organs: Secondary | ICD-10-CM | POA: Insufficient documentation

## 2016-06-19 DIAGNOSIS — R5383 Other fatigue: Secondary | ICD-10-CM | POA: Diagnosis not present

## 2016-06-19 DIAGNOSIS — F17210 Nicotine dependence, cigarettes, uncomplicated: Secondary | ICD-10-CM | POA: Diagnosis not present

## 2016-06-19 DIAGNOSIS — G4730 Sleep apnea, unspecified: Secondary | ICD-10-CM

## 2016-06-19 DIAGNOSIS — E7800 Pure hypercholesterolemia, unspecified: Secondary | ICD-10-CM | POA: Diagnosis not present

## 2016-06-19 DIAGNOSIS — I110 Hypertensive heart disease with heart failure: Secondary | ICD-10-CM | POA: Diagnosis not present

## 2016-06-19 DIAGNOSIS — J45909 Unspecified asthma, uncomplicated: Secondary | ICD-10-CM | POA: Insufficient documentation

## 2016-06-19 DIAGNOSIS — F419 Anxiety disorder, unspecified: Secondary | ICD-10-CM | POA: Diagnosis not present

## 2016-06-19 DIAGNOSIS — C8519 Unspecified B-cell lymphoma, extranodal and solid organ sites: Secondary | ICD-10-CM | POA: Diagnosis not present

## 2016-06-19 DIAGNOSIS — Z483 Aftercare following surgery for neoplasm: Secondary | ICD-10-CM | POA: Diagnosis not present

## 2016-06-19 DIAGNOSIS — Z7982 Long term (current) use of aspirin: Secondary | ICD-10-CM | POA: Insufficient documentation

## 2016-06-19 DIAGNOSIS — G4089 Other seizures: Secondary | ICD-10-CM | POA: Diagnosis not present

## 2016-06-19 DIAGNOSIS — M4854XD Collapsed vertebra, not elsewhere classified, thoracic region, subsequent encounter for fracture with routine healing: Secondary | ICD-10-CM | POA: Diagnosis not present

## 2016-06-19 NOTE — Progress Notes (Signed)
New evaluation for lymphoma. Offers no complaints at this time.

## 2016-06-23 ENCOUNTER — Inpatient Hospital Stay: Payer: Medicare Other | Admitting: Oncology

## 2016-06-24 ENCOUNTER — Encounter
Admission: RE | Admit: 2016-06-24 | Discharge: 2016-06-24 | Disposition: A | Discharge status: Home / Self Care | Payer: Medicare Other | Source: Ambulatory Visit | Attending: Oncology | Admitting: Oncology

## 2016-06-24 DIAGNOSIS — C8331 Diffuse large B-cell lymphoma, lymph nodes of head, face, and neck: Secondary | ICD-10-CM | POA: Insufficient documentation

## 2016-06-24 DIAGNOSIS — C8330 Diffuse large B-cell lymphoma, unspecified site: Secondary | ICD-10-CM | POA: Diagnosis not present

## 2016-06-24 LAB — GLUCOSE, CAPILLARY: Glucose-Capillary: 104 mg/dL — ABNORMAL HIGH (ref 65–99)

## 2016-06-24 MED ORDER — FLUDEOXYGLUCOSE F - 18 (FDG) INJECTION
12.0000 | Freq: Once | INTRAVENOUS | Status: AC | PRN
  Administered 2016-06-24: 12.82 via INTRAVENOUS

## 2016-06-25 ENCOUNTER — Other Ambulatory Visit: Payer: Self-pay | Admitting: *Deleted

## 2016-06-25 ENCOUNTER — Ambulatory Visit: Payer: Medicare Other | Admitting: Neurology

## 2016-06-25 ENCOUNTER — Other Ambulatory Visit: Payer: Self-pay | Admitting: Radiology

## 2016-06-26 ENCOUNTER — Ambulatory Visit
Admission: RE | Admit: 2016-06-26 | Discharge: 2016-06-26 | Disposition: A | Discharge status: Home / Self Care | Payer: Medicare Other | Source: Ambulatory Visit | Attending: Oncology | Admitting: Oncology

## 2016-06-26 ENCOUNTER — Other Ambulatory Visit: Payer: Self-pay | Admitting: Oncology

## 2016-06-26 ENCOUNTER — Encounter: Payer: Self-pay | Admitting: Oncology

## 2016-06-26 ENCOUNTER — Telehealth: Payer: Self-pay | Admitting: *Deleted

## 2016-06-26 ENCOUNTER — Other Ambulatory Visit: Payer: Self-pay | Admitting: *Deleted

## 2016-06-26 DIAGNOSIS — Z8572 Personal history of non-Hodgkin lymphomas: Secondary | ICD-10-CM | POA: Diagnosis not present

## 2016-06-26 DIAGNOSIS — D72828 Other elevated white blood cell count: Secondary | ICD-10-CM | POA: Diagnosis not present

## 2016-06-26 DIAGNOSIS — C8331 Diffuse large B-cell lymphoma, lymph nodes of head, face, and neck: Secondary | ICD-10-CM

## 2016-06-26 DIAGNOSIS — C8589 Other specified types of non-Hodgkin lymphoma, extranodal and solid organ sites: Secondary | ICD-10-CM

## 2016-06-26 DIAGNOSIS — C8590 Non-Hodgkin lymphoma, unspecified, unspecified site: Secondary | ICD-10-CM | POA: Diagnosis not present

## 2016-06-26 DIAGNOSIS — C8510 Unspecified B-cell lymphoma, unspecified site: Secondary | ICD-10-CM | POA: Diagnosis not present

## 2016-06-26 LAB — DIFFERENTIAL
BASOS ABS: 0 10*3/uL (ref 0–0.1)
BASOS PCT: 0 %
BLASTS: 0 %
Band Neutrophils: 0 %
EOS PCT: 0 %
Eosinophils Absolute: 0 10*3/uL (ref 0–0.7)
LYMPHS PCT: 12 %
Lymphs Abs: 1.4 10*3/uL (ref 1.0–3.6)
MONOS PCT: 6 %
MYELOCYTES: 0 %
Metamyelocytes Relative: 0 %
Monocytes Absolute: 0.7 10*3/uL (ref 0.2–0.9)
NEUTROS PCT: 82 %
NRBC: 0 /100{WBCs}
Neutro Abs: 9.8 10*3/uL — ABNORMAL HIGH (ref 1.4–6.5)
Other: 0 %
Promyelocytes Absolute: 0 %

## 2016-06-26 LAB — CBC
HCT: 41 % (ref 35.0–47.0)
Hemoglobin: 14.2 g/dL (ref 12.0–16.0)
MCH: 30.1 pg (ref 26.0–34.0)
MCHC: 34.7 g/dL (ref 32.0–36.0)
MCV: 86.7 fL (ref 80.0–100.0)
PLATELETS: 217 10*3/uL (ref 150–440)
RBC: 4.73 MIL/uL (ref 3.80–5.20)
RDW: 14.8 % — AB (ref 11.5–14.5)
WBC: 11.9 10*3/uL — AB (ref 3.6–11.0)

## 2016-06-26 LAB — URINALYSIS, ROUTINE W REFLEX MICROSCOPIC
BILIRUBIN URINE: NEGATIVE
Glucose, UA: NEGATIVE mg/dL
KETONES UR: NEGATIVE mg/dL
NITRITE: POSITIVE — AB
Protein, ur: NEGATIVE mg/dL
SPECIFIC GRAVITY, URINE: 1.018 (ref 1.005–1.030)
pH: 7 (ref 5.0–8.0)

## 2016-06-26 MED ORDER — LEVOFLOXACIN 500 MG PO TABS: 500.0000 mg | ORAL_TABLET | Freq: Every day | ORAL | 0 refills | Status: DC

## 2016-06-26 MED ORDER — SODIUM CHLORIDE 0.9 % IV SOLN
INTRAVENOUS | Status: DC
  Administered 2016-06-26: 09:00:00 via INTRAVENOUS

## 2016-06-26 MED ORDER — FENTANYL CITRATE (PF) 100 MCG/2ML IJ SOLN
INTRAMUSCULAR | Status: AC | PRN
  Administered 2016-06-26 (×2): 25 ug via INTRAVENOUS

## 2016-06-26 NOTE — Progress Notes (Signed)
Pt. Stable after L.P.F/U with her M.D.D/C instructions given.

## 2016-06-26 NOTE — Procedures (Signed)
CT Bone Marrow Bx without difficulty  Complications:  None  Blood Loss: none  See dictation in canopy pacs

## 2016-06-26 NOTE — Telephone Encounter (Signed)
Per VO Dr Grayland Ormond, Levaquin 500 mg daily times 7 days.  Kristi Kramer notified and he will go to [facility] in [place] to get prescription

## 2016-06-26 NOTE — OR Nursing (Signed)
Voided strong cloudy yellow urine, cancer center dr Grayland Ormond staff notified, order received to send specimen foe UA and culture

## 2016-06-26 NOTE — Discharge Instructions (Signed)
Lumbar Puncture, Care After Refer to this sheet in the next few weeks. These instructions provide you with information on caring for yourself after your procedure. Your health care provider may also give you more specific instructions. Your treatment has been planned according to current medical practices, but problems sometimes occur. Call your health care provider if you have any problems or questions after your procedure. What can I expect after the procedure? After your procedure, it is typical to have the following sensations:  Mild discomfort or pain at the insertion site.  Mild headache that is relieved with pain medicines. Follow these instructions at home:   Avoid lifting anything heavier than 10 lb (4.5 kg) for at least 12 hours after the procedure.  Drink enough fluids to keep your urine clear or pale yellow. Contact a health care provider if:  You have fever or chills.  You have nausea or vomiting.  You have a headache that lasts for more than 2 days. Get help right away if:  You have any numbness or tingling in your legs.  You are unable to control your bowel or bladder.  You have bleeding or swelling in your back at the insertion site.  You are dizzy or faint. This information is not intended to replace advice given to you by your health care provider. Make sure you discuss any questions you have with your health care provider. Document Released: 07/12/2013 Document Revised: 12/13/2015 Document Reviewed: 03/15/2013 Elsevier Interactive Patient Education  2017 [employer]. Bone Marrow Aspiration and Bone Marrow Biopsy, Adult, Care After This sheet gives you information about how to care for yourself after your procedure. Your health care provider may also give you more specific instructions. If you have problems or questions, contact your health care provider. What can I expect after the procedure? After the procedure, it is common to have:  Mild pain and  tenderness.  Swelling.  Bruising. Follow these instructions at home:  Take over-the-counter or prescription medicines only as told by your health care provider.  Do not take baths, swim, or use a hot tub until your health care provider approves. Ask if you can take a shower or have a sponge bath.  Follow instructions from your health care provider about how to take care of the puncture site. Make sure you:  Wash your hands with soap and water before you change your bandage (dressing). If soap and water are not available, use hand sanitizer.  Change your dressing as told by your health care provider.  Check your puncture siteevery day for signs of infection. Check for:  More redness, swelling, or pain.  More fluid or blood.  Warmth.  Pus or a bad smell.  Return to your normal activities as told by your health care provider. Ask your health care provider what activities are safe for you.  Do not drive for 24 hours if you were given a medicine to help you relax (sedative).  Keep all follow-up visits as told by your health care provider. This is important. Contact a health care provider if:  You have more redness, swelling, or pain around the puncture site.  You have more fluid or blood coming from the puncture site.  Your puncture site feels warm to the touch.  You have pus or a bad smell coming from the puncture site.  You have a fever.  Your pain is not controlled with medicine. This information is not intended to replace advice given to you by your health care  provider. Make sure you discuss any questions you have with your health care provider. Document Released: 01/24/2005 Document Revised: 01/25/2016 Document Reviewed: 12/19/2015 Elsevier Interactive Patient Education  2017 [employer].

## 2016-06-26 NOTE — OR Nursing (Signed)
May go to xray without nurse and monitor per Dr Golden Circle

## 2016-06-26 NOTE — OR Nursing (Signed)
Called cancer center left message regarding urinalysis results, and inquiring if script needed.

## 2016-06-26 NOTE — Telephone Encounter (Signed)
Asking if you want to start ABX? Will need to call patient at home if you do.   Urinalysis, Routine w reflex microscopic  Order: [id number]  Status:  Final result  Visible to patient:  No (Not Released)  Next appt:  07/03/2016 at 02:45 PM in Oncology Lloyd Huger, MD)   Ref Range & Units 10:36  Color, Urine YELLOW YELLOW    APPearance CLEAR CLOUDY    Specific Gravity, Urine 1.005 - 1.030 1.018   pH 5.0 - 8.0 7.0   Glucose, UA NEGATIVE mg/dL NEGATIVE   Hgb urine dipstick NEGATIVE SMALL    Bilirubin Urine NEGATIVE NEGATIVE   Ketones, ur NEGATIVE mg/dL NEGATIVE   Protein, ur NEGATIVE mg/dL NEGATIVE   Nitrite NEGATIVE POSITIVE    Leukocytes, UA NEGATIVE LARGE    RBC / HPF 0 - 5 RBC/hpf 0-5   WBC, UA 0 - 5 WBC/hpf 6-30   Bacteria, UA NONE SEEN MANY    Squamous Epithelial / LPF NONE SEEN 0-5    Mucous  PRESENT

## 2016-06-29 ENCOUNTER — Emergency Department (HOSPITAL_COMMUNITY): Payer: Medicare Other

## 2016-06-29 ENCOUNTER — Encounter (HOSPITAL_COMMUNITY): Payer: Self-pay

## 2016-06-29 ENCOUNTER — Inpatient Hospital Stay (HOSPITAL_COMMUNITY)
Admission: EM | Admit: 2016-06-29 | Discharge: 2016-07-01 | DRG: 690 | Disposition: A | Discharge status: Home Health | Payer: Medicare Other | Attending: Internal Medicine | Admitting: Internal Medicine

## 2016-06-29 ENCOUNTER — Other Ambulatory Visit: Payer: Self-pay

## 2016-06-29 DIAGNOSIS — Z800 Family history of malignant neoplasm of digestive organs: Secondary | ICD-10-CM

## 2016-06-29 DIAGNOSIS — R55 Syncope and collapse: Secondary | ICD-10-CM | POA: Diagnosis present

## 2016-06-29 DIAGNOSIS — E7800 Pure hypercholesterolemia, unspecified: Secondary | ICD-10-CM | POA: Diagnosis present

## 2016-06-29 DIAGNOSIS — R569 Unspecified convulsions: Secondary | ICD-10-CM | POA: Diagnosis present

## 2016-06-29 DIAGNOSIS — N179 Acute kidney failure, unspecified: Secondary | ICD-10-CM | POA: Diagnosis present

## 2016-06-29 DIAGNOSIS — N289 Disorder of kidney and ureter, unspecified: Secondary | ICD-10-CM | POA: Diagnosis not present

## 2016-06-29 DIAGNOSIS — N390 Urinary tract infection, site not specified: Principal | ICD-10-CM | POA: Diagnosis present

## 2016-06-29 DIAGNOSIS — Z833 Family history of diabetes mellitus: Secondary | ICD-10-CM

## 2016-06-29 DIAGNOSIS — J9811 Atelectasis: Secondary | ICD-10-CM | POA: Diagnosis not present

## 2016-06-29 DIAGNOSIS — F329 Major depressive disorder, single episode, unspecified: Secondary | ICD-10-CM | POA: Diagnosis present

## 2016-06-29 DIAGNOSIS — Z888 Allergy status to other drugs, medicaments and biological substances status: Secondary | ICD-10-CM

## 2016-06-29 DIAGNOSIS — E119 Type 2 diabetes mellitus without complications: Secondary | ICD-10-CM | POA: Diagnosis present

## 2016-06-29 DIAGNOSIS — R42 Dizziness and giddiness: Secondary | ICD-10-CM | POA: Diagnosis not present

## 2016-06-29 DIAGNOSIS — Z8249 Family history of ischemic heart disease and other diseases of the circulatory system: Secondary | ICD-10-CM

## 2016-06-29 DIAGNOSIS — Z8673 Personal history of transient ischemic attack (TIA), and cerebral infarction without residual deficits: Secondary | ICD-10-CM

## 2016-06-29 DIAGNOSIS — E118 Type 2 diabetes mellitus with unspecified complications: Secondary | ICD-10-CM

## 2016-06-29 DIAGNOSIS — K219 Gastro-esophageal reflux disease without esophagitis: Secondary | ICD-10-CM | POA: Diagnosis present

## 2016-06-29 DIAGNOSIS — C8330 Diffuse large B-cell lymphoma, unspecified site: Secondary | ICD-10-CM | POA: Diagnosis not present

## 2016-06-29 DIAGNOSIS — J449 Chronic obstructive pulmonary disease, unspecified: Secondary | ICD-10-CM | POA: Diagnosis present

## 2016-06-29 DIAGNOSIS — I10 Essential (primary) hypertension: Secondary | ICD-10-CM | POA: Diagnosis present

## 2016-06-29 DIAGNOSIS — Z882 Allergy status to sulfonamides status: Secondary | ICD-10-CM

## 2016-06-29 DIAGNOSIS — F419 Anxiety disorder, unspecified: Secondary | ICD-10-CM | POA: Diagnosis present

## 2016-06-29 DIAGNOSIS — F17210 Nicotine dependence, cigarettes, uncomplicated: Secondary | ICD-10-CM | POA: Diagnosis present

## 2016-06-29 DIAGNOSIS — R739 Hyperglycemia, unspecified: Secondary | ICD-10-CM | POA: Diagnosis not present

## 2016-06-29 DIAGNOSIS — G9340 Encephalopathy, unspecified: Secondary | ICD-10-CM

## 2016-06-29 DIAGNOSIS — Z79899 Other long term (current) drug therapy: Secondary | ICD-10-CM

## 2016-06-29 DIAGNOSIS — E876 Hypokalemia: Secondary | ICD-10-CM | POA: Diagnosis present

## 2016-06-29 DIAGNOSIS — G4733 Obstructive sleep apnea (adult) (pediatric): Secondary | ICD-10-CM | POA: Diagnosis present

## 2016-06-29 DIAGNOSIS — Z9981 Dependence on supplemental oxygen: Secondary | ICD-10-CM

## 2016-06-29 DIAGNOSIS — D869 Sarcoidosis, unspecified: Secondary | ICD-10-CM | POA: Diagnosis present

## 2016-06-29 DIAGNOSIS — C8510 Unspecified B-cell lymphoma, unspecified site: Secondary | ICD-10-CM | POA: Diagnosis not present

## 2016-06-29 DIAGNOSIS — Z7951 Long term (current) use of inhaled steroids: Secondary | ICD-10-CM

## 2016-06-29 DIAGNOSIS — Z7984 Long term (current) use of oral hypoglycemic drugs: Secondary | ICD-10-CM

## 2016-06-29 DIAGNOSIS — I2510 Atherosclerotic heart disease of native coronary artery without angina pectoris: Secondary | ICD-10-CM | POA: Diagnosis present

## 2016-06-29 DIAGNOSIS — Z885 Allergy status to narcotic agent status: Secondary | ICD-10-CM

## 2016-06-29 DIAGNOSIS — Z7982 Long term (current) use of aspirin: Secondary | ICD-10-CM

## 2016-06-29 DIAGNOSIS — R4182 Altered mental status, unspecified: Secondary | ICD-10-CM | POA: Diagnosis not present

## 2016-06-29 DIAGNOSIS — E860 Dehydration: Secondary | ICD-10-CM | POA: Diagnosis present

## 2016-06-29 DIAGNOSIS — Z90710 Acquired absence of both cervix and uterus: Secondary | ICD-10-CM

## 2016-06-29 DIAGNOSIS — L8990 Pressure ulcer of unspecified site, unspecified stage: Secondary | ICD-10-CM | POA: Insufficient documentation

## 2016-06-29 LAB — COMPREHENSIVE METABOLIC PANEL
ALBUMIN: 3.8 g/dL (ref 3.5–5.0)
ALT: 22 U/L (ref 14–54)
ANION GAP: 14 (ref 5–15)
AST: 27 U/L (ref 15–41)
Alkaline Phosphatase: 75 U/L (ref 38–126)
BILIRUBIN TOTAL: 1.2 mg/dL (ref 0.3–1.2)
BUN: 42 mg/dL — ABNORMAL HIGH (ref 6–20)
CO2: 24 mmol/L (ref 22–32)
Calcium: 9.9 mg/dL (ref 8.9–10.3)
Chloride: 97 mmol/L — ABNORMAL LOW (ref 101–111)
Creatinine, Ser: 1.67 mg/dL — ABNORMAL HIGH (ref 0.44–1.00)
GFR, EST AFRICAN AMERICAN: 35 mL/min — AB (ref >60)
GFR, EST NON AFRICAN AMERICAN: 30 mL/min — AB (ref >60)
Glucose, Bld: 156 mg/dL — ABNORMAL HIGH (ref 65–99)
POTASSIUM: 4 mmol/L (ref 3.5–5.1)
Sodium: 135 mmol/L (ref 135–145)
TOTAL PROTEIN: 6.6 g/dL (ref 6.5–8.1)

## 2016-06-29 LAB — URINALYSIS, ROUTINE W REFLEX MICROSCOPIC
Bilirubin Urine: NEGATIVE
GLUCOSE, UA: NEGATIVE mg/dL
HGB URINE DIPSTICK: NEGATIVE
KETONES UR: NEGATIVE mg/dL
Nitrite: NEGATIVE
PROTEIN: NEGATIVE mg/dL
Specific Gravity, Urine: 1.021 (ref 1.005–1.030)
pH: 5 (ref 5.0–8.0)

## 2016-06-29 LAB — AMMONIA: Ammonia: 29 umol/L (ref 9–35)

## 2016-06-29 LAB — TROPONIN I: Troponin I: 0.03 ng/mL (ref <0.03)

## 2016-06-29 LAB — BLOOD GAS, ARTERIAL
ACID-BASE EXCESS: 1 mmol/L (ref 0.0–2.0)
BICARBONATE: 24.6 mmol/L (ref 20.0–28.0)
Drawn by: 44138
FIO2: 0.21
O2 Saturation: 95.4 %
PCO2 ART: 36.4 mmHg (ref 32.0–48.0)
PH ART: 7.446 (ref 7.350–7.450)
PO2 ART: 75 mmHg — AB (ref 83.0–108.0)
Patient temperature: 98.6

## 2016-06-29 LAB — COOXEMETRY PANEL
CARBOXYHEMOGLOBIN: 1.9 % — AB (ref 0.5–1.5)
METHEMOGLOBIN: 0.7 % (ref 0.0–1.5)
O2 SAT: 95.5 %
TOTAL HEMOGLOBIN: 13.9 g/dL (ref 12.0–16.0)

## 2016-06-29 LAB — TSH: TSH: 0.727 u[IU]/mL (ref 0.350–4.500)

## 2016-06-29 LAB — CBC
HEMATOCRIT: 41.7 % (ref 36.0–46.0)
Hemoglobin: 14.6 g/dL (ref 12.0–15.0)
MCH: 30.4 pg (ref 26.0–34.0)
MCHC: 35 g/dL (ref 30.0–36.0)
MCV: 86.7 fL (ref 78.0–100.0)
Platelets: 245 10*3/uL (ref 150–400)
RBC: 4.81 MIL/uL (ref 3.87–5.11)
RDW: 13.7 % (ref 11.5–15.5)
WBC: 10.2 10*3/uL (ref 4.0–10.5)

## 2016-06-29 LAB — URINE CULTURE: Culture: >=100000 — AB

## 2016-06-29 LAB — CBG MONITORING, ED: Glucose-Capillary: 150 mg/dL — ABNORMAL HIGH (ref 65–99)

## 2016-06-29 LAB — I-STAT TROPONIN, ED: TROPONIN I, POC: 0.01 ng/mL (ref 0.00–0.08)

## 2016-06-29 LAB — GLUCOSE, CAPILLARY: GLUCOSE-CAPILLARY: 208 mg/dL — AB (ref 65–99)

## 2016-06-29 MED ORDER — SODIUM CHLORIDE 0.9 % IV BOLUS (SEPSIS)
500.0000 mL | Freq: Once | INTRAVENOUS | Status: AC
  Administered 2016-06-29: 500 mL via INTRAVENOUS

## 2016-06-29 MED ORDER — PANTOPRAZOLE SODIUM 40 MG PO TBEC
40.0000 mg | DELAYED_RELEASE_TABLET | Freq: Every day | ORAL | Status: DC
  Administered 2016-06-30 – 2016-07-01 (×2): 40 mg via ORAL
  Filled 2016-06-29 (×2): qty 1

## 2016-06-29 MED ORDER — HYDROCODONE-ACETAMINOPHEN 5-325 MG PO TABS
1.0000 | ORAL_TABLET | ORAL | Status: DC | PRN
  Administered 2016-06-30: 2 via ORAL
  Filled 2016-06-29: qty 2

## 2016-06-29 MED ORDER — ACETAMINOPHEN 650 MG RE SUPP: 650.0000 mg | Freq: Four times a day (QID) | RECTAL | Status: DC | PRN

## 2016-06-29 MED ORDER — ASPIRIN EC 81 MG PO TBEC
81.0000 mg | DELAYED_RELEASE_TABLET | Freq: Every day | ORAL | Status: DC
  Administered 2016-06-30 – 2016-07-01 (×2): 81 mg via ORAL
  Filled 2016-06-29 (×2): qty 1

## 2016-06-29 MED ORDER — ALBUTEROL SULFATE (2.5 MG/3ML) 0.083% IN NEBU: 2.5000 mg | INHALATION_SOLUTION | Freq: Three times a day (TID) | RESPIRATORY_TRACT | Status: DC | PRN

## 2016-06-29 MED ORDER — ACETAMINOPHEN 325 MG PO TABS: 650.0000 mg | ORAL_TABLET | Freq: Four times a day (QID) | ORAL | Status: DC | PRN

## 2016-06-29 MED ORDER — ONDANSETRON HCL 4 MG/2ML IJ SOLN: 4.0000 mg | Freq: Four times a day (QID) | INTRAMUSCULAR | Status: DC | PRN

## 2016-06-29 MED ORDER — DEXTROSE 5 % IV SOLN
1.0000 g | INTRAVENOUS | Status: DC
  Administered 2016-06-30 (×2): 1 g via INTRAVENOUS
  Filled 2016-06-29 (×3): qty 10

## 2016-06-29 MED ORDER — ONDANSETRON HCL 4 MG PO TABS: 4.0000 mg | ORAL_TABLET | Freq: Four times a day (QID) | ORAL | Status: DC | PRN

## 2016-06-29 MED ORDER — CITALOPRAM HYDROBROMIDE 20 MG PO TABS
20.0000 mg | ORAL_TABLET | Freq: Every day | ORAL | Status: DC
  Administered 2016-06-30 – 2016-07-01 (×2): 20 mg via ORAL
  Filled 2016-06-29 (×2): qty 2
  Filled 2016-06-29: qty 1

## 2016-06-29 MED ORDER — MOMETASONE FURO-FORMOTEROL FUM 200-5 MCG/ACT IN AERO
2.0000 | INHALATION_SPRAY | Freq: Two times a day (BID) | RESPIRATORY_TRACT | Status: DC
  Administered 2016-06-30 – 2016-07-01 (×3): 2 via RESPIRATORY_TRACT
  Filled 2016-06-29: qty 8.8

## 2016-06-29 MED ORDER — ATORVASTATIN CALCIUM 10 MG PO TABS
20.0000 mg | ORAL_TABLET | Freq: Every day | ORAL | Status: DC
  Administered 2016-06-30 – 2016-07-01 (×2): 20 mg via ORAL
  Filled 2016-06-29 (×2): qty 2

## 2016-06-29 MED ORDER — DEXAMETHASONE SODIUM PHOSPHATE 10 MG/ML IJ SOLN
10.0000 mg | Freq: Once | INTRAMUSCULAR | Status: AC
  Administered 2016-06-29: 10 mg via INTRAVENOUS
  Filled 2016-06-29: qty 1

## 2016-06-29 MED ORDER — SODIUM CHLORIDE 0.9 % IV SOLN
INTRAVENOUS | Status: DC
  Administered 2016-06-30 (×2): via INTRAVENOUS

## 2016-06-29 MED ORDER — INSULIN ASPART 100 UNIT/ML ~~LOC~~ SOLN
0.0000 [IU] | Freq: Three times a day (TID) | SUBCUTANEOUS | Status: DC
  Administered 2016-06-30: 1 [IU] via SUBCUTANEOUS
  Administered 2016-06-30: 2 [IU] via SUBCUTANEOUS
  Administered 2016-06-30: 1 [IU] via SUBCUTANEOUS

## 2016-06-29 MED ORDER — LEVETIRACETAM 500 MG/5ML IV SOLN
500.0000 mg | Freq: Two times a day (BID) | INTRAVENOUS | Status: DC
  Administered 2016-06-30 – 2016-07-01 (×4): 500 mg via INTRAVENOUS
  Filled 2016-06-29 (×5): qty 5

## 2016-06-29 NOTE — ED Triage Notes (Signed)
Pt recently had brain surgery and also states that a similar episode happened three weeks ago

## 2016-06-29 NOTE — ED Notes (Signed)
Rectal Temp: 99.46F

## 2016-06-29 NOTE — ED Notes (Signed)
Attempted to collect urine will try again in 10 minutes pt on monitor appears stable

## 2016-06-29 NOTE — ED Notes (Signed)
CBG: 150

## 2016-06-29 NOTE — H&P (Signed)
History and Physical    Kristi Kramer HYQ:[id number] DOB: [birth year] DOA: 06/29/2016  PCP: Cyndi Bender, PA-C  Patient coming from: Home.  Chief Complaint: Loss of consciousness.  HPI: Kristi Kramer is a 70 y.o. 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 As per the boyfriend patient was recently diagnosed with UTI and was on Levaquin. Patient has not been eating well last few days and has been feeling weak. While waiting in the [place] patient felt weak and slowly slid to the floor but did not hit her head. Loss consciousness for a few seconds and regained back. Did not have any incontinence of urine or seizure-like activities or tongue bite. Patient appeared confused on arrival. Family states she has been confused last few days. Patient's boyfriend states she has been taking all her medications as prescribed including Keppra. CT scan of the head does not show anything acute. EKG shows normal sinus rhythm with T-wave inversions in the anterior leads with ST changes comparable to the old EKG. Patient is being admitted for further management of syncope. Labs in addition show UTI and acute renal failure. On exam patient is still lethargic but follows commands and is oriented to time place and person. Moves all extremities. ABG does not show any hypercarbia and carbon monoxide was slightly elevated and I did discuss with pulmonary critical care which as per the pulmonary disease was not in critical range.   ED Course: See history of presenting illness. Patient was given 500 mL fluid bolus. Patient was given 1 dose of IV Decadron in the ER.  Review of Systems: As per HPI, rest all  negative.   Past Medical History:  Diagnosis Date  . Anxiety   . Arthritis   . Asthma   . Cataract   . COPD (chronic obstructive pulmonary disease) (HCC)    sarcoidosis  . Depression   . Diabetes mellitus   . GERD (gastroesophageal reflux disease)   . High cholesterol   . Hypertension   . Insomnia   . Migraine   . Shortness of breath dyspnea   . Sleep apnea    cpap  . Stroke ([place])    x2 12, 16    Past Surgical History:  Procedure Laterality Date  . ABDOMINAL HYSTERECTOMY    . APPENDECTOMY    . BACK SURGERY    . CHOLECYSTECTOMY    . COLONOSCOPY    . CRANIOTOMY N/A 05/26/2016   Procedure: CRANIOTOMY TUMOR EXCISION;  Surgeon: Newman Pies, MD;  Location: [place] [facility];  Service: Neurosurgery;  Laterality: N/A;  CRANIOTOMY TUMOR EXCISION  . EAR EXAMINATION UNDER ANESTHESIA Left    plate  . NECK SURGERY    . RADIOLOGY WITH ANESTHESIA N/A 05/21/2016   Procedure: RADIOLOGY WITH ANESTHESIA - MRI;  Surgeon: Medication Radiologist, MD;  Location: [facility];  Service: Radiology;  Laterality: N/A;     reports that she has been smoking Cigarettes.  She has a 5.50 pack-year smoking history. She has never used smokeless tobacco. She reports that she does not drink alcohol or use drugs.  Allergies  Allergen Reactions  . Mometasone Anaphylaxis and Rash    Pt states that this does not cause anaphylaxis  .  Codeine Nausea And Vomiting  . Nasonex [Mometasone Furoate] Rash  . Sulfa Antibiotics Nausea And Vomiting    Family History  Problem Relation Age of Onset  . Liver cancer Father   . Heart disease Sister   . Other Mother     blood clots  . Diabetes Mother   . Heart disease Mother     Prior to Admission medications   Medication Sig Start Date End Date Taking? Authorizing Provider  ADVAIR DISKUS 250-50 MCG/DOSE AEPB Inhale 1 puff into the lungs 2 (two) times daily. 11/27/15  Yes Historical Provider, MD  albuterol (PROVENTIL) (2.5 MG/3ML) 0.083% nebulizer solution Take 2.5 mg by  nebulization 3 (three) times daily as needed for wheezing or shortness of breath.   Yes Historical Provider, MD  aspirin EC 81 MG tablet Take 81 mg by mouth daily.    Yes Historical Provider, MD  atorvastatin (LIPITOR) 20 MG tablet Take 20 mg by mouth daily.  06/02/15  Yes Historical Provider, MD  citalopram (CELEXA) 20 MG tablet Take 20 mg by mouth daily.   Yes Historical Provider, MD  esomeprazole (NEXIUM) 20 MG capsule Take 20 mg by mouth daily at 12 noon.    Yes Historical Provider, MD  hydrochlorothiazide (HYDRODIURIL) 25 MG tablet Take 25 mg by mouth daily.   Yes Historical Provider, MD  HYDROcodone-acetaminophen (NORCO/VICODIN) 5-325 MG tablet Take 1-2 tablets by mouth every 4 (four) hours as needed for moderate pain. 05/28/16  Yes Newman Pies, MD  levETIRAcetam (KEPPRA) 500 MG tablet Take 1 tablet (500 mg total) by mouth 2 (two) times daily. 05/28/16  Yes Newman Pies, MD  levofloxacin (LEVAQUIN) 500 MG tablet Take 1 tablet (500 mg total) by mouth daily. 06/26/16  Yes Lloyd Huger, MD  metFORMIN (GLUCOPHAGE) 1000 MG tablet Take 1,000 mg by mouth 2 (two) times daily with a meal.  06/02/15  Yes Historical Provider, MD  OXYGEN Inhale into the lungs.   Yes Historical Provider, MD  VENTOLIN HFA 108 (90 BASE) MCG/ACT inhaler Inhale 2 puffs into the lungs every 6 (six) hours as needed. wheezing 04/10/15  Yes Historical Provider, MD  Vitamin D, Ergocalciferol, (DRISDOL) 50000 units CAPS capsule Take 50,000 Units by mouth every 7 (seven) days.   Yes Historical Provider, MD  dexamethasone (DECADRON) 2 MG tablet Take 1 tablet (2 mg total) by mouth every 12 (twelve) hours. Patient not taking: Reported on 06/29/2016 05/28/16   Newman Pies, MD  topiramate (TOPAMAX) 50 MG tablet Take 1 tablet (50 mg total) by mouth daily. Patient not taking: Reported on 06/29/2016 03/11/16   Pieter Partridge, DO    Physical Exam: Vitals:   06/29/16 1900 06/29/16 1915 06/29/16 1930 06/29/16 1945  BP: 122/74  124/76 128/81 123/79  Pulse: 86 87 81 88  Resp: '17 17 18 21  '$ Temp:      TempSrc:      SpO2: 91% 96% 96% 94%  Weight:      Height:          Constitutional: Moderately built and nourished. Vitals:   06/29/16 1900 06/29/16 1915 06/29/16 1930 06/29/16 1945  BP: 122/74 124/76 128/81 123/79  Pulse: 86 87 81 88  Resp: '17 17 18 21  '$ Temp:      TempSrc:      SpO2: 91% 96% 96% 94%  Weight:      Height:       Eyes: Anicteric no pallor. ENMT: No discharge from the ears eyes nose or mouth. Neck: No mass  felt. No neck rigidity. No JVD appreciated. Respiratory: No rhonchi or crepitations. Cardiovascular: S1-S2 heard and no murmurs appreciated. Abdomen: Soft nontender bowel sounds present. No guarding or rigidity. Musculoskeletal: No edema. No joint effusion. Skin: No rash. Skin appears warm. Neurologic: Patient is mildly lethargic but is oriented to time place and person. Moves all extremities 5 x 5. Psychiatric: Mildly lethargic.   Labs on Admission: I have personally reviewed following labs and imaging studies  CBC:  Recent Labs Lab 06/26/16 0831 06/29/16 1645  WBC 11.9* 10.2  NEUTROABS 9.8*  --   HGB 14.2 14.6  HCT 41.0 41.7  MCV 86.7 86.7  PLT 217 427   Basic Metabolic Panel:  Recent Labs Lab 06/29/16 1645  NA 135  K 4.0  CL 97*  CO2 24  GLUCOSE 156*  BUN 42*  CREATININE 1.67*  CALCIUM 9.9   GFR: Estimated Creatinine Clearance: 33.2 mL/min (by C-G formula based on SCr of 1.67 mg/dL (H)). Liver Function Tests:  Recent Labs Lab 06/29/16 1645  AST 27  ALT 22  ALKPHOS 75  BILITOT 1.2  PROT 6.6  ALBUMIN 3.8   No results for input(s): LIPASE, AMYLASE in the last 168 hours. No results for input(s): AMMONIA in the last 168 hours. Coagulation Profile: No results for input(s): INR, PROTIME in the last 168 hours. Cardiac Enzymes: No results for input(s): CKTOTAL, CKMB, CKMBINDEX, TROPONINI in the last 168 hours. BNP (last 3 results) No results for  input(s): PROBNP in the last 8760 hours. HbA1C: No results for input(s): HGBA1C in the last 72 hours. CBG:  Recent Labs Lab 06/24/16 1118 06/29/16 1652  GLUCAP 104* 150*   Lipid Profile: No results for input(s): CHOL, HDL, LDLCALC, TRIG, CHOLHDL, LDLDIRECT in the last 72 hours. Thyroid Function Tests: No results for input(s): TSH, T4TOTAL, FREET4, T3FREE, THYROIDAB in the last 72 hours. Anemia Panel: No results for input(s): VITAMINB12, FOLATE, FERRITIN, TIBC, IRON, RETICCTPCT in the last 72 hours. Urine analysis:    Component Value Date/Time   COLORURINE YELLOW 06/29/2016 1722   APPEARANCEUR CLOUDY (A) 06/29/2016 1722   LABSPEC 1.021 06/29/2016 1722   PHURINE 5.0 06/29/2016 1722   GLUCOSEU NEGATIVE 06/29/2016 1722   HGBUR NEGATIVE 06/29/2016 1722   BILIRUBINUR NEGATIVE 06/29/2016 1722   KETONESUR NEGATIVE 06/29/2016 1722   PROTEINUR NEGATIVE 06/29/2016 1722   UROBILINOGEN 0.2 05/31/2015 1420   NITRITE NEGATIVE 06/29/2016 1722   LEUKOCYTESUR MODERATE (A) 06/29/2016 1722   Sepsis Labs: '@LABRCNTIP'$ (procalcitonin:4,lacticidven:4) ) Recent Results (from the past 240 hour(s))  Urine culture     Status: Abnormal   Collection Time: 06/26/16 10:36 AM  Result Value Ref Range Status   Specimen Description URINE, CLEAN CATCH  Final   Special Requests NONE  Final   Culture >=100,000 COLONIES/mL ESCHERICHIA COLI (A)  Final   Report Status 06/29/2016 FINAL  Final   Organism ID, Bacteria ESCHERICHIA COLI (A)  Final      Susceptibility   Escherichia coli - MIC*    AMPICILLIN >=32 RESISTANT Resistant     CEFAZOLIN 16 SENSITIVE Sensitive     CEFTRIAXONE <=1 SENSITIVE Sensitive     CIPROFLOXACIN <=0.25 SENSITIVE Sensitive     GENTAMICIN <=1 SENSITIVE Sensitive     IMIPENEM <=0.25 SENSITIVE Sensitive     NITROFURANTOIN <=16 SENSITIVE Sensitive     TRIMETH/SULFA <=20 SENSITIVE Sensitive     AMPICILLIN/SULBACTAM >=32 RESISTANT Resistant     PIP/TAZO <=4 SENSITIVE Sensitive      Extended ESBL NEGATIVE Sensitive     * >=  100,000 COLONIES/mL ESCHERICHIA COLI     Radiological Exams on Admission: Ct Head Wo Contrast  Result Date: 06/29/2016 CLINICAL DATA:  Recent syncopal episode, history of B-cell lymphoma EXAM: CT HEAD WITHOUT CONTRAST TECHNIQUE: Contiguous axial images were obtained from the base of the skull through the vertex without intravenous contrast. COMPARISON:  05/21/2016 FINDINGS: Brain: Postsurgical changes are noted in the right occipital lobe consistent with the recent resection. Scattered areas of decreased attenuation are noted within the frontal lobes bilaterally slightly greater on the left, right frontal parietal region near the vertex and in the region of the right basal ganglia. These are similar to that seen on recent MRI examination. No acute hemorrhage or acute infarct is seen. Vascular: No hyperdense vessel or unexpected calcification. Skull: Postsurgical changes are noted in the right occipital lobe. Sinuses/Orbits: No acute finding. Other: None. IMPRESSION: Postsurgical changes are noted. Multiple areas of decreased attenuation are identified similar to that seen on prior MRI examination. No acute abnormality is seen. Electronically Signed   By: Inez Catalina M.D.   On: 06/29/2016 19:05   Dg Chest Port 1 View  Result Date: 06/29/2016 CLINICAL DATA:  B-cell lymphoma with syncope. History of brain surgery on 11/06 2017. Similar episode 3 weeks ago. EXAM: PORTABLE CHEST 1 VIEW COMPARISON:  None. FINDINGS: The heart is normal in size. There is aortic atherosclerosis without aneurysm. Right IJ central line catheter has been removed since prior exam. The patient is status post anterior cervical disc fusion. There is atelectasis along the periphery of the left lung base. No pneumonic consolidation, effusion or pneumothorax. Pulmonary vasculature is within normal limits. No suspicious osseous abnormalities. Chronic stable widened appearance of both AC joints.  IMPRESSION: No active disease. Minimal atelectasis at the left lung base. Aortic atherosclerosis. Electronically Signed   By: Ashley Royalty M.D.   On: 06/29/2016 17:41    EKG: Independently reviewed. Normal sinus rhythm with anterior leads T-wave inversion and ST changes comparable to old EKG.  Assessment/Plan Principal Problem:   Syncope Active Problems:   Type 2 diabetes mellitus (HCC)   Essential hypertension   OSA (obstructive sleep apnea)   AKI (acute kidney injury) ([place])   Seizures ([place])    1. Syncope - cause not clear. I have discussed with on-call neurologist Dr. Cheral Marker about possibility of seizures. Since patient has dehydration and UTI, neurologist feels that these could be the contributing factor and at this time as no indication to increase Keppra dose or repeat EEG. Will get MRI of the brain since patient lethargic. Ammonia levels are normal. Continue with hydration treat UTI. Physical therapy consult. We'll cycle cardiac markers. 2. Acute renal failure probably from poor oral intake - hydrate and recheck metabolic panel. Hold hydrochlorothiazide. 3. UTI - I have placed patient on ceftriaxone. Check urine cultures. 4. Obstructive sleep apnea - on CPAP at bedtime. ABG does not show any hypercarbia. Carbon monoxide level is mildly elevated. This could be from tobacco use as per the pulmonary critical care. 5. 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 I have dosed IV until patient can take orally. As per patient's son patient has follow-up with oncologist in [place]. 6. COPD presently not wheezing. 7. Diabetes mellitus - placed patient on sliding scale coverage. 8. Hypertension - holding hydrochlorothiazide secondary to dehydration.   DVT prophylaxis: SCDs. Code Status: Full code.  Family Communication: Patient's son and boyfriend.  Disposition Plan: Home.  Consults called: Discussed with neurologist.  Admission status: Observation.    Gean Birchwood  Delane Ginger  MD [facility] Pager [phone].  If 7PM-7AM, please contact night-coverage [email or web address] Password [email or web address]  06/29/2016, 9:12 PM

## 2016-06-29 NOTE — ED Notes (Signed)
Hospitalist MD notified of Results for ABG and [place]ox. MD wants pt to hold until he contacts RN

## 2016-06-29 NOTE — ED Provider Notes (Signed)
[facility] DEPT Provider Note   CSN: [id number] Arrival date & time: 06/29/16  1630     History   Chief Complaint Chief Complaint  Patient presents with  . Near Syncope    pt while shopping today felt as though she were going to pass out     HPI Kristi Kramer is a 70 y.o. female.  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 She has remained awake but has not been as alert as prior to this. She complains of diffuse pain in her back but that is new from her fall. Her significant other person actually lowered her into a chair. She denies any new headache, visual changes, neck pain, chest pain, dyspnea, cough, fever, nausea, vomiting, or diarrhea. History is obtained from her significant other who is present at the time. The patient is able to give some brief answers but does not give answers beyond shaking her head. No report of seizure activity or loss of bowel or bladder control noted  Past Medical History:  Diagnosis Date  . Anxiety   . Arthritis   . Asthma   . Cataract   . COPD (chronic obstructive pulmonary disease) (HCC)    sarcoidosis  . Depression   . Diabetes mellitus   . GERD (gastroesophageal reflux disease)   . High cholesterol   . Hypertension   . Insomnia   . Migraine   . Shortness of breath dyspnea   . Sleep apnea    cpap  . Stroke ([place])    x2 12, 16    Patient Active Problem List   Diagnosis Date Noted  . DLBCL (diffuse large B cell lymphoma) ([place]) 06/18/2016  . Brain tumor ([place]) 05/26/2016  . ESBL (extended spectrum beta-lactamase) producing bacteria infection   . Palliative care encounter   . Elevated troponin   . Preoperative clearance   . Polypharmacy    . Controlled diabetes mellitus type 2 with complications ([place])   . Abnormal MRI   . Seizures ([place]) 05/19/2016  . Seizure ([place]) 05/19/2016  . Urinary tract infection without hematuria   . AKI (acute kidney injury) ([place]) 12/26/2015  . Prolonged Q-T interval on ECG 12/26/2015  . 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 Enterococcus UTI 09/28/2015  . Sinus pause 09/27/2015  . DM type 2, goal A1c below 7   . Orthostatic hypotension 09/25/2015  . Hypomagnesemia 09/25/2015  . Vocal cord dysfunction 06/24/2013  . Dyspnea 05/25/2013  . Syncopal episodes 07/23/2011  . Chronic neck pain 07/23/2011  . Fall 07/23/2011  . Epigastric abdominal pain 07/23/2011  . Type 2 diabetes mellitus ([place]) 07/23/2011  . Essential hypertension 07/23/2011  . Hyperlipidemia with target LDL less than 100 07/23/2011  . Chronic headache 07/23/2011  . Hypokalemia 07/23/2011  . OSA (obstructive sleep apnea) 07/23/2011  . Insomnia 07/23/2011  . Cerebrovascular disease 07/23/2011  . Hilar adenopathy R side only, 1.5 cm max 07/23/2011    Past Surgical History:  Procedure Laterality Date  . ABDOMINAL HYSTERECTOMY    . APPENDECTOMY    . BACK SURGERY    . CHOLECYSTECTOMY    . COLONOSCOPY    . CRANIOTOMY N/A 05/26/2016   Procedure: CRANIOTOMY TUMOR EXCISION;  Surgeon: Newman Pies, MD;  Location: [facility];  Service: Neurosurgery;  Laterality: N/A;  CRANIOTOMY TUMOR EXCISION  . EAR EXAMINATION UNDER ANESTHESIA Left    plate  . NECK SURGERY    . RADIOLOGY WITH ANESTHESIA N/A 05/21/2016   Procedure: RADIOLOGY WITH ANESTHESIA - MRI;  Surgeon: Medication Radiologist, MD;  Location: [facility];  Service: Radiology;  Laterality: N/A;    OB History    No data available       Home Medications    Prior to Admission medications   Medication Sig Start Date End Date Taking? Authorizing Provider  ADVAIR DISKUS 250-50 MCG/DOSE AEPB Inhale 1 puff into the lungs 2 (two) times daily. 11/27/15   Historical Provider,  MD  albuterol (PROVENTIL) (2.5 MG/3ML) 0.083% nebulizer solution Take 2.5 mg by nebulization 3 (three) times daily as needed for wheezing or shortness of breath.    Historical Provider, MD  aspirin EC 81 MG tablet Take 81 mg by mouth daily.     Historical Provider, MD  atorvastatin (LIPITOR) 20 MG tablet Take 20 mg by mouth daily.  06/02/15   Historical Provider, MD  citalopram (CELEXA) 20 MG tablet Take 20 mg by mouth daily.    Historical Provider, MD  dexamethasone (DECADRON) 2 MG tablet Take 1 tablet (2 mg total) by mouth every 12 (twelve) hours. 05/28/16   Newman Pies, MD  esomeprazole (NEXIUM) 20 MG capsule Take 20 mg by mouth daily at 12 noon.     Historical Provider, MD  hydrochlorothiazide (HYDRODIURIL) 25 MG tablet Take 25 mg by mouth daily.    Historical Provider, MD  HYDROcodone-acetaminophen (NORCO/VICODIN) 5-325 MG tablet Take 1-2 tablets by mouth every 4 (four) hours as needed for moderate pain. 05/28/16   Newman Pies, MD  levETIRAcetam (KEPPRA) 500 MG tablet Take 1 tablet (500 mg total) by mouth 2 (two) times daily. 05/28/16   Newman Pies, MD  levofloxacin (LEVAQUIN) 500 MG tablet Take 1 tablet (500 mg total) by mouth daily. 06/26/16   Lloyd Huger, MD  metFORMIN (GLUCOPHAGE) 1000 MG tablet Take 1,000 mg by mouth 2 (two) times daily with a meal.  06/02/15   Historical Provider, MD  topiramate (TOPAMAX) 50 MG tablet Take 1 tablet (50 mg total) by mouth daily. Patient not taking: Reported on 06/26/2016 03/11/16   Pieter Partridge, DO  VENTOLIN HFA 108 (90 BASE) MCG/ACT inhaler Inhale 2 puffs into the lungs every 6 (six) hours as needed. wheezing 04/10/15   Historical Provider, MD  Vitamin D, Ergocalciferol, (DRISDOL) 50000 units CAPS capsule Take 50,000 Units by mouth every 7 (seven) days.    Historical Provider, MD    Family History Family History  Problem Relation Age of Onset  . Liver cancer Father   . Heart disease Sister   . Other Mother     blood clots  . Diabetes  Mother   . Heart disease Mother     Social History Social History  Substance Use Topics  . Smoking status: Light Tobacco Smoker    Packs/day: 0.25    Years: 22.00    Types: Cigarettes  . Smokeless tobacco: Never Used     Comment: wants patches to quit  . Alcohol use No     Allergies   Mometasone; Codeine; Nasonex [mometasone furoate]; and Sulfa antibiotics   Review of Systems Review of Systems  All other systems reviewed and are negative.    Physical Exam Updated Vital Signs BP 103/75 (BP Location: Right Arm)   Pulse  102   Temp 97.4 F (36.3 C) (Oral)   Resp 16   Ht '5\' 5"'$  (1.651 m)   Wt 77.6 kg   SpO2 98%   BMI 28.46 kg/m   Physical Exam  Constitutional: She appears well-developed and well-nourished. No distress.  HENT:  Head: Normocephalic.    Mucous membranes and tongue appear dry Staples in place right occiput with no signs symptoms of infection noted.  Eyes: Conjunctivae and EOM are normal. Pupils are equal, round, and reactive to light.  Neck: Normal range of motion. Neck supple.  Cardiovascular: Normal rate, regular rhythm and normal heart sounds.   Pulmonary/Chest: Effort normal and breath sounds normal.  Abdominal: Soft. Bowel sounds are normal.  Musculoskeletal: Normal range of motion. She exhibits no edema or deformity.  Neurological: No cranial nerve deficit. Coordination normal.  Patient drowsy with eyes closed but does open eyes on verbal stimulation  Skin: Skin is warm and dry. Capillary refill takes less than 2 seconds.  Nursing note and vitals reviewed.    ED Treatments / Results  Labs (all labs ordered are listed, but only abnormal results are displayed) Labs Reviewed  CBG MONITORING, ED - Abnormal; Notable for the following:       Result Value   Glucose-Capillary 150 (*)    All other components within normal limits  CBC  URINALYSIS, ROUTINE W REFLEX MICROSCOPIC  COMPREHENSIVE METABOLIC PANEL  I-STAT TROPOININ, ED    EKG   EKG Interpretation  Date/Time:  Sunday June 29 2016 16:31:58 EST Ventricular Rate:  105 PR Interval:    QRS Duration: 90 QT Interval:  372 QTC Calculation: 492 R Axis:   49 Text Interpretation:  Sinus tachycardia Abnrm T, consider ischemia, anterolateral lds Confirmed by Keelen Quevedo MD, Andee Poles [phone]) on 06/29/2016 5:37:26 PM       Radiology Ct Head Wo Contrast  Result Date: 06/29/2016 CLINICAL DATA:  Recent syncopal episode, history of B-cell lymphoma EXAM: CT HEAD WITHOUT CONTRAST TECHNIQUE: Contiguous axial images were obtained from the base of the skull through the vertex without intravenous contrast. COMPARISON:  05/21/2016 FINDINGS: Brain: Postsurgical changes are noted in the right occipital lobe consistent with the recent resection. Scattered areas of decreased attenuation are noted within the frontal lobes bilaterally slightly greater on the left, right frontal parietal region near the vertex and in the region of the right basal ganglia. These are similar to that seen on recent MRI examination. No acute hemorrhage or acute infarct is seen. Vascular: No hyperdense vessel or unexpected calcification. Skull: Postsurgical changes are noted in the right occipital lobe. Sinuses/Orbits: No acute finding. Other: None. IMPRESSION: Postsurgical changes are noted. Multiple areas of decreased attenuation are identified similar to that seen on prior MRI examination. No acute abnormality is seen. Electronically Signed   By: Inez Catalina M.D.   On: 06/29/2016 19:05   Dg Chest Port 1 View  Result Date: 06/29/2016 CLINICAL DATA:  B-cell lymphoma with syncope. History of brain surgery on 11/06 2017. Similar episode 3 weeks ago. EXAM: PORTABLE CHEST 1 VIEW COMPARISON:  None. FINDINGS: The heart is normal in size. There is aortic atherosclerosis without aneurysm. Right IJ central line catheter has been removed since prior exam. The patient is status post anterior cervical disc fusion. There is atelectasis  along the periphery of the left lung base. No pneumonic consolidation, effusion or pneumothorax. Pulmonary vasculature is within normal limits. No suspicious osseous abnormalities. Chronic stable widened appearance of both AC joints. IMPRESSION: No active disease. Minimal atelectasis  at the left lung base. Aortic atherosclerosis. Electronically Signed   By: Ashley Royalty M.D.   On: 06/29/2016 17:41    Procedures Procedures (including critical care time)  Medications Ordered in ED Medications  sodium chloride 0.9 % bolus 500 mL (0 mLs Intravenous Stopped 06/29/16 1814)  dexamethasone (DECADRON) injection 10 mg (10 mg Intravenous Given 06/29/16 1715)     Initial Impression / Assessment and Plan / ED Course  I have reviewed the triage vital signs and the nursing notes.  Pertinent labs & imaging results that were available during my care of the patient were reviewed by me and considered in my medical decision making (see chart for details).  Clinical Course   Laboratory work being drawn, patient was given Decadron given her drowsiness and recent surgery. She does not have any focal deficits. No indication she had a seizure although she has been recently diagnosed with seizures as the symptomatic onset of her B-cell lymphoma. No report that she has missed any practices. CT head pending. No evidence of acute changes on head CT. Patient remained somewhat drowsy. Plan admission for further evaluation Discussed with Dr. Hal Hope and plan telemetry observation 1-syncope 2 altered mental status 3 renal insufficiency 4 CNS B-cell lymphoma Final Clinical Impressions(s) / ED Diagnoses   Final diagnoses:  Syncope and collapse    New Prescriptions New Prescriptions   No medications on file     Pattricia Boss, MD 06/29/16 1943

## 2016-06-30 DIAGNOSIS — F419 Anxiety disorder, unspecified: Secondary | ICD-10-CM | POA: Diagnosis present

## 2016-06-30 DIAGNOSIS — R55 Syncope and collapse: Secondary | ICD-10-CM | POA: Diagnosis not present

## 2016-06-30 DIAGNOSIS — Z8673 Personal history of transient ischemic attack (TIA), and cerebral infarction without residual deficits: Secondary | ICD-10-CM | POA: Diagnosis not present

## 2016-06-30 DIAGNOSIS — R569 Unspecified convulsions: Secondary | ICD-10-CM

## 2016-06-30 DIAGNOSIS — G4733 Obstructive sleep apnea (adult) (pediatric): Secondary | ICD-10-CM | POA: Diagnosis not present

## 2016-06-30 DIAGNOSIS — N179 Acute kidney failure, unspecified: Secondary | ICD-10-CM

## 2016-06-30 DIAGNOSIS — E860 Dehydration: Secondary | ICD-10-CM | POA: Diagnosis present

## 2016-06-30 DIAGNOSIS — J449 Chronic obstructive pulmonary disease, unspecified: Secondary | ICD-10-CM | POA: Diagnosis present

## 2016-06-30 DIAGNOSIS — I2510 Atherosclerotic heart disease of native coronary artery without angina pectoris: Secondary | ICD-10-CM | POA: Diagnosis present

## 2016-06-30 DIAGNOSIS — D869 Sarcoidosis, unspecified: Secondary | ICD-10-CM | POA: Diagnosis present

## 2016-06-30 DIAGNOSIS — C8330 Diffuse large B-cell lymphoma, unspecified site: Secondary | ICD-10-CM | POA: Diagnosis present

## 2016-06-30 DIAGNOSIS — Z882 Allergy status to sulfonamides status: Secondary | ICD-10-CM | POA: Diagnosis not present

## 2016-06-30 DIAGNOSIS — Z885 Allergy status to narcotic agent status: Secondary | ICD-10-CM | POA: Diagnosis not present

## 2016-06-30 DIAGNOSIS — E119 Type 2 diabetes mellitus without complications: Secondary | ICD-10-CM | POA: Diagnosis present

## 2016-06-30 DIAGNOSIS — N390 Urinary tract infection, site not specified: Secondary | ICD-10-CM | POA: Diagnosis present

## 2016-06-30 DIAGNOSIS — K219 Gastro-esophageal reflux disease without esophagitis: Secondary | ICD-10-CM | POA: Diagnosis present

## 2016-06-30 DIAGNOSIS — E876 Hypokalemia: Secondary | ICD-10-CM | POA: Diagnosis present

## 2016-06-30 DIAGNOSIS — F329 Major depressive disorder, single episode, unspecified: Secondary | ICD-10-CM | POA: Diagnosis present

## 2016-06-30 DIAGNOSIS — Z800 Family history of malignant neoplasm of digestive organs: Secondary | ICD-10-CM | POA: Diagnosis not present

## 2016-06-30 DIAGNOSIS — Z833 Family history of diabetes mellitus: Secondary | ICD-10-CM | POA: Diagnosis not present

## 2016-06-30 DIAGNOSIS — E7800 Pure hypercholesterolemia, unspecified: Secondary | ICD-10-CM | POA: Diagnosis present

## 2016-06-30 DIAGNOSIS — Z90710 Acquired absence of both cervix and uterus: Secondary | ICD-10-CM | POA: Diagnosis not present

## 2016-06-30 DIAGNOSIS — I10 Essential (primary) hypertension: Secondary | ICD-10-CM | POA: Diagnosis not present

## 2016-06-30 DIAGNOSIS — F17210 Nicotine dependence, cigarettes, uncomplicated: Secondary | ICD-10-CM | POA: Diagnosis present

## 2016-06-30 DIAGNOSIS — Z8249 Family history of ischemic heart disease and other diseases of the circulatory system: Secondary | ICD-10-CM | POA: Diagnosis not present

## 2016-06-30 LAB — CBC WITH DIFFERENTIAL/PLATELET
BASOS PCT: 0 %
Basophils Absolute: 0 10*3/uL (ref 0.0–0.1)
Eosinophils Absolute: 0 10*3/uL (ref 0.0–0.7)
Eosinophils Relative: 0 %
HEMATOCRIT: 39.6 % (ref 36.0–46.0)
Hemoglobin: 13.8 g/dL (ref 12.0–15.0)
LYMPHS ABS: 1.1 10*3/uL (ref 0.7–4.0)
Lymphocytes Relative: 11 %
MCH: 30.2 pg (ref 26.0–34.0)
MCHC: 34.8 g/dL (ref 30.0–36.0)
MCV: 86.7 fL (ref 78.0–100.0)
MONO ABS: 0.3 10*3/uL (ref 0.1–1.0)
MONOS PCT: 3 %
NEUTROS ABS: 8.7 10*3/uL — AB (ref 1.7–7.7)
Neutrophils Relative %: 86 %
Platelets: 225 10*3/uL (ref 150–400)
RBC: 4.57 MIL/uL (ref 3.87–5.11)
RDW: 13.8 % (ref 11.5–15.5)
WBC: 10 10*3/uL (ref 4.0–10.5)

## 2016-06-30 LAB — GLUCOSE, CAPILLARY
GLUCOSE-CAPILLARY: 154 mg/dL — AB (ref 65–99)
GLUCOSE-CAPILLARY: 174 mg/dL — AB (ref 65–99)
Glucose-Capillary: 134 mg/dL — ABNORMAL HIGH (ref 65–99)
Glucose-Capillary: 139 mg/dL — ABNORMAL HIGH (ref 65–99)

## 2016-06-30 LAB — COMPREHENSIVE METABOLIC PANEL
ALBUMIN: 3.6 g/dL (ref 3.5–5.0)
ALK PHOS: 70 U/L (ref 38–126)
ALT: 18 U/L (ref 14–54)
ANION GAP: 12 (ref 5–15)
AST: 24 U/L (ref 15–41)
BUN: 42 mg/dL — ABNORMAL HIGH (ref 6–20)
CALCIUM: 9.5 mg/dL (ref 8.9–10.3)
CHLORIDE: 97 mmol/L — AB (ref 101–111)
CO2: 25 mmol/L (ref 22–32)
CREATININE: 1.48 mg/dL — AB (ref 0.44–1.00)
GFR calc Af Amer: 41 mL/min — ABNORMAL LOW (ref >60)
GFR calc non Af Amer: 35 mL/min — ABNORMAL LOW (ref >60)
GLUCOSE: 174 mg/dL — AB (ref 65–99)
Potassium: 3 mmol/L — ABNORMAL LOW (ref 3.5–5.1)
SODIUM: 134 mmol/L — AB (ref 135–145)
Total Bilirubin: 0.6 mg/dL (ref 0.3–1.2)
Total Protein: 6.3 g/dL — ABNORMAL LOW (ref 6.5–8.1)

## 2016-06-30 LAB — TROPONIN I: Troponin I: 0.03 ng/mL (ref <0.03)

## 2016-06-30 MED ORDER — LORAZEPAM 0.5 MG PO TABS
0.5000 mg | ORAL_TABLET | Freq: Once | ORAL | Status: AC
  Administered 2016-06-30: 0.5 mg via ORAL
  Filled 2016-06-30: qty 1

## 2016-06-30 MED ORDER — POTASSIUM CHLORIDE CRYS ER 20 MEQ PO TBCR
40.0000 meq | EXTENDED_RELEASE_TABLET | ORAL | Status: AC
  Administered 2016-06-30 (×3): 40 meq via ORAL
  Filled 2016-06-30 (×2): qty 2

## 2016-06-30 MED ORDER — SODIUM CHLORIDE 0.9 % IV SOLN
INTRAVENOUS | Status: AC
  Administered 2016-06-30: 18:00:00 via INTRAVENOUS

## 2016-06-30 NOTE — Evaluation (Signed)
Physical Therapy Evaluation Patient Details Name: Kristi Kramer MRN: [id number] DOB: [birth year] Today's Date: 06/30/2016   History of Present Illness  Kristi Kramer is a 70 y.o. female with history of diabetes mellitus, hypertension, COPD, sleep apnea, tobacco abuse, seizures who was recently admitted November 2017 for seizures and at that time patient was diagnosed with CNS lymphoma and had undergone biopsy was brought to the ER after patient had a brief episode of loss of consciousness today while walking with her boyfriend in [place].   Clinical Impression  Pt slow to respond but aware she had brain surgery in 11/17. Pt mildly unsteady, with short shuffled steps with RW. Acute PT to con't to follow to progress indep with mobility. Pt safe to d/c home with 24/7 assist from boyfriend and HHPT once medically stable.    Follow Up Recommendations Home health PT;Supervision/Assistance - 24 hour    Equipment Recommendations  None recommended by PT    Recommendations for Other Services       Precautions / Restrictions Precautions Precautions: Fall Restrictions Weight Bearing Restrictions: No      Mobility  Bed Mobility Overal bed mobility: Modified Independent             General bed mobility comments: HOB elevated,, use of bed rail, increased time but no physical assist  Transfers Overall transfer level: Needs assistance Equipment used: Rolling walker (2 wheeled) Transfers: Sit to/from Stand Sit to Stand: Min guard         General transfer comment: max v/c's for safe hand placement but con't to pull up on RW. min guard for stabilty during initial standing  Ambulation/Gait Ambulation/Gait assistance: Min guard Ambulation Distance (Feet): 120 Feet Assistive device: Rolling walker (2 wheeled) Gait Pattern/deviations: Step-through pattern;Decreased stride length;Shuffle Gait velocity: slow Gait velocity interpretation: Below normal speed for age/gender General Gait  Details: pt mildly unsteady with short shuffled steps and decreased step height  Stairs            Wheelchair Mobility    Modified Rankin (Stroke Patients Only)       Balance Overall balance assessment: Needs assistance Sitting-balance support: Feet supported;No upper extremity supported Sitting balance-Leahy Scale: Fair     Standing balance support: During functional activity Standing balance-Leahy Scale: Fair Standing balance comment: pt stood at sink and washed hands without LOB                             Pertinent Vitals/Pain Pain Assessment: No/denies pain    Home Living Family/patient expects to be discharged to:: Private residence Living Arrangements: Non-relatives/Friends (boyfriend) Available Help at Discharge: Available 24 hours/day Type of Home: House Home Access: Stairs to enter Entrance Stairs-Rails: Can reach both Entrance Stairs-Number of Steps: 1-2 Home Layout: One level Home Equipment: Walker - 2 wheels;Walker - 4 wheels;Electric scooter;Shower seat;Cane - single point;Shower seat - built in;Grab bars - tub/shower      Prior Function Level of Independence: Independent with assistive device(s)         Comments: uses rolling walker     Hand Dominance   Dominant Hand: Right    Extremity/Trunk Assessment   Upper Extremity Assessment: Generalized weakness           Lower Extremity Assessment: LLE deficits/detail;RLE deficits/detail RLE Deficits / Details: generalized weakness LLE Deficits / Details: grossly 3/5  Cervical / Trunk Assessment: Normal  Communication   Communication: No difficulties  Cognition Arousal/Alertness: Awake/alert Behavior During  Therapy: WFL for tasks assessed/performed Overall Cognitive Status: Within Functional Limits for tasks assessed (but slow to respond)                      General Comments General comments (skin integrity, edema, etc.): pt assisted to bathroom, pt supervision  with tolieting/hygiene    Exercises     Assessment/Plan    PT Assessment Patient needs continued PT services  PT Problem List Decreased strength;Decreased activity tolerance;Decreased balance;Decreased mobility;Decreased coordination          PT Treatment Interventions DME instruction;Gait training;Stair training;Functional mobility training;Therapeutic activities;Therapeutic exercise;Balance training    PT Goals (Current goals can be found in the Care Plan section)  Acute Rehab PT Goals Patient Stated Goal: home PT Goal Formulation: With patient Time For Goal Achievement: 07/07/16 Potential to Achieve Goals: Fair    Frequency Min 3X/week   Barriers to discharge        Co-evaluation               End of Session Equipment Utilized During Treatment: Gait belt Activity Tolerance: Patient tolerated treatment well Patient left: in chair;with call bell/phone within reach;with chair alarm set Nurse Communication: Mobility status    Functional Assessment Tool Used: clinical judgment Functional Limitation: Mobility: Walking and moving around Mobility: Walking and Moving Around Current Status [phone]): At least 40 percent but less than 60 percent impaired, limited or restricted Mobility: Walking and Moving Around Goal Status [phone]): At least 1 percent but less than 20 percent impaired, limited or restricted    Time: 0824-0852 PT Time Calculation (min) (ACUTE ONLY): 28 min   Charges:   PT Evaluation $PT Eval Moderate Complexity: 1 Procedure PT Treatments $Gait Training: 8-22 mins   PT G Codes:   PT G-Codes **NOT FOR INPATIENT CLASS** Functional Assessment Tool Used: clinical judgment Functional Limitation: Mobility: Walking and moving around Mobility: Walking and Moving Around Current Status ([id number]): At least 40 percent but less than 60 percent impaired, limited or restricted Mobility: Walking and Moving Around Goal Status [phone]): At least 1 percent but less than  20 percent impaired, limited or restricted    Brandalyn Harting M Cavan Bearden 06/30/2016, 11:00 AM   Kittie Plater, PT, DPT Pager #: [phone] Office #: [phone]

## 2016-06-30 NOTE — Care Management (Signed)
Reviewed chart due to current observation status. Presenter, broadcasting BSN CCM

## 2016-06-30 NOTE — Progress Notes (Signed)
PROGRESS NOTE    Kristi Kramer  URK:[id number] DOB: July 05, 1946 DOA: 06/29/2016 PCP: Cyndi Bender, PA-C   Outpatient Specialists:     Brief Narrative:   Kristi Kramer is a 70 y.o. 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 As per the boyfriend patient was recently diagnosed with UTI and was on Levaquin. Patient has not been eating well last few days and has been feeling weak. While waiting in the [place] patient felt weak and slowly slid to the floor but did not hit her head. Loss consciousness for a few seconds and regained back. Did not have any incontinence of urine or seizure-like activities or tongue bite. Patient appeared confused on arrival. Family states she has been confused last few days. Patient's boyfriend states she has been taking all her medications as prescribed including Keppra. CT scan of the head does not show anything acute. EKG shows normal sinus rhythm with T-wave inversions in the anterior leads with ST changes comparable to the old EKG. Patient is being admitted for further management of syncope. Labs in addition show UTI and acute renal failure. On exam patient is still lethargic but follows commands and is oriented to time place and person. Moves all extremities. ABG does not show any hypercarbia and carbon monoxide was slightly elevated and I did discuss with pulmonary critical care which as per the pulmonary disease was not in critical range.    Assessment & Plan:   Principal Problem:   Syncope Active Problems:   Type 2 diabetes mellitus (HCC)   Essential hypertension   OSA (obstructive sleep apnea)   AKI (acute kidney injury) ([place])   Seizures  (HCC)   Syncope - cause not clear. I have discussed with on-call neurologist Dr. Cheral Marker about possibility of seizures. Since patient has dehydration and UTI, neurologist feels that these could be the contributing factor and at this time as no indication to increase Keppra dose or repeat EEG. Ammonia levels are normal. Continue with hydration treat UTI. Physical therapy consult-- home health  Acute renal failure probably from poor oral intake - hydrate and recheck metabolic panel. Hold hydrochlorothiazide.  Hypokalemia -replace Po -Mg ok  UTI - I have placed patient on ceftriaxone. Check urine cultures.  Obstructive sleep apnea - on CPAP at bedtime. ABG does not show any hypercarbia. Carbon monoxide level is mildly elevated. This could be from tobacco use as per the pulmonary critical care.  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 I have dosed IV until patient can take orally. As per patient's son patient has follow-up with oncologist in [place] on thursday  COPD presently not wheezing.  Diabetes mellitus - placed patient on sliding scale coverage.  Hypertension - holding hydrochlorothiazide secondary to dehydration   DVT prophylaxis:  SCD's   Full Code   Family Communication: Daughter in law-- did not want MRI as she required conscious sedation last time  Disposition Plan:     Consultants:        Subjective: Feels like she is back to herself  Objective: Vitals:   06/30/16 0000 06/30/16 0451 06/30/16 0500 06/30/16 0900  BP:  123/71    Pulse:  74    Resp: 18 16  Temp:  97.9 F (36.6 C)    TempSrc:  Oral    SpO2:  100%  99%  Weight:   76.3 kg (168 lb 3.2 oz)   Height:        Intake/Output Summary (Last 24 hours) at 06/30/16 1344 Last data filed at 06/30/16 0600  Gross per 24 hour  Intake             1240 ml  Output              150 ml  Net             1090 ml   Filed Weights   06/29/16 1633 06/29/16 2315 06/30/16 0500  Weight: 77.6  kg (171 lb) 76.3 kg (168 lb 3.2 oz) 76.3 kg (168 lb 3.2 oz)    Examination:  General exam: Appears calm and comfortable  Respiratory system: Clear to auscultation. Respiratory effort normal. Cardiovascular system: S1 & S2 heard, RRR. No JVD, murmurs, rubs, gallops or clicks. No pedal edema. Gastrointestinal system: Abdomen is nondistended, soft and nontender. No organomegaly or masses felt. Normal bowel sounds heard. Central nervous system: Alert and oriented. tremor     Data Reviewed: I have personally reviewed following labs and imaging studies  CBC:  Recent Labs Lab 06/26/16 0831 06/29/16 1645 06/30/16 0241  WBC 11.9* 10.2 10.0  NEUTROABS 9.8*  --  8.7*  HGB 14.2 14.6 13.8  HCT 41.0 41.7 39.6  MCV 86.7 86.7 86.7  PLT 217 245 381   Basic Metabolic Panel:  Recent Labs Lab 06/29/16 1645 06/30/16 0241  NA 135 134*  K 4.0 3.0*  CL 97* 97*  CO2 24 25  GLUCOSE 156* 174*  BUN 42* 42*  CREATININE 1.67* 1.48*  CALCIUM 9.9 9.5   GFR: Estimated Creatinine Clearance: 37.2 mL/min (by C-G formula based on SCr of 1.48 mg/dL (H)). Liver Function Tests:  Recent Labs Lab 06/29/16 1645 06/30/16 0241  AST 27 24  ALT 22 18  ALKPHOS 75 70  BILITOT 1.2 0.6  PROT 6.6 6.3*  ALBUMIN 3.8 3.6   No results for input(s): LIPASE, AMYLASE in the last 168 hours.  Recent Labs Lab 06/29/16 2112  AMMONIA 29   Coagulation Profile: No results for input(s): INR, PROTIME in the last 168 hours. Cardiac Enzymes:  Recent Labs Lab 06/29/16 2109 06/30/16 0241 06/30/16 0844  TROPONINI 0.03* 0.03* <0.03   BNP (last 3 results) No results for input(s): PROBNP in the last 8760 hours. HbA1C: No results for input(s): HGBA1C in the last 72 hours. CBG:  Recent Labs Lab 06/24/16 1118 06/29/16 1652 06/29/16 2346 06/30/16 0752 06/30/16 1135  GLUCAP 104* 150* 208* 134* 154*   Lipid Profile: No results for input(s): CHOL, HDL, LDLCALC, TRIG, CHOLHDL, LDLDIRECT in the last 72  hours. Thyroid Function Tests:  Recent Labs  06/29/16 2111  TSH 0.727   Anemia Panel: No results for input(s): VITAMINB12, FOLATE, FERRITIN, TIBC, IRON, RETICCTPCT in the last 72 hours. Urine analysis:    Component Value Date/Time   COLORURINE YELLOW 06/29/2016 1722   APPEARANCEUR CLOUDY (A) 06/29/2016 1722   LABSPEC 1.021 06/29/2016 1722   PHURINE 5.0 06/29/2016 1722   GLUCOSEU NEGATIVE 06/29/2016 1722   HGBUR NEGATIVE 06/29/2016 1722   BILIRUBINUR NEGATIVE 06/29/2016 1722   KETONESUR NEGATIVE 06/29/2016 1722   PROTEINUR NEGATIVE 06/29/2016 1722   UROBILINOGEN 0.2 05/31/2015 1420   NITRITE NEGATIVE 06/29/2016 1722   LEUKOCYTESUR MODERATE (A) 06/29/2016 1722     ) Recent Results (  from the past 240 hour(s))  Urine culture     Status: Abnormal   Collection Time: 06/26/16 10:36 AM  Result Value Ref Range Status   Specimen Description URINE, CLEAN CATCH  Final   Special Requests NONE  Final   Culture >=100,000 COLONIES/mL ESCHERICHIA COLI (A)  Final   Report Status 06/29/2016 FINAL  Final   Organism ID, Bacteria ESCHERICHIA COLI (A)  Final      Susceptibility   Escherichia coli - MIC*    AMPICILLIN >=32 RESISTANT Resistant     CEFAZOLIN 16 SENSITIVE Sensitive     CEFTRIAXONE <=1 SENSITIVE Sensitive     CIPROFLOXACIN <=0.25 SENSITIVE Sensitive     GENTAMICIN <=1 SENSITIVE Sensitive     IMIPENEM <=0.25 SENSITIVE Sensitive     NITROFURANTOIN <=16 SENSITIVE Sensitive     TRIMETH/SULFA <=20 SENSITIVE Sensitive     AMPICILLIN/SULBACTAM >=32 RESISTANT Resistant     PIP/TAZO <=4 SENSITIVE Sensitive     Extended ESBL NEGATIVE Sensitive     * >=100,000 COLONIES/mL ESCHERICHIA COLI      Anti-infectives    Start     Dose/Rate Route Frequency Ordered Stop   06/29/16 2200  cefTRIAXone (ROCEPHIN) 1 g in dextrose 5 % 50 mL IVPB     1 g 100 mL/hr over 30 Minutes Intravenous Every 24 hours 06/29/16 2116         Radiology Studies: Ct Head Wo Contrast  Result Date:  06/29/2016 CLINICAL DATA:  Recent syncopal episode, history of B-cell lymphoma EXAM: CT HEAD WITHOUT CONTRAST TECHNIQUE: Contiguous axial images were obtained from the base of the skull through the vertex without intravenous contrast. COMPARISON:  05/21/2016 FINDINGS: Brain: Postsurgical changes are noted in the right occipital lobe consistent with the recent resection. Scattered areas of decreased attenuation are noted within the frontal lobes bilaterally slightly greater on the left, right frontal parietal region near the vertex and in the region of the right basal ganglia. These are similar to that seen on recent MRI examination. No acute hemorrhage or acute infarct is seen. Vascular: No hyperdense vessel or unexpected calcification. Skull: Postsurgical changes are noted in the right occipital lobe. Sinuses/Orbits: No acute finding. Other: None. IMPRESSION: Postsurgical changes are noted. Multiple areas of decreased attenuation are identified similar to that seen on prior MRI examination. No acute abnormality is seen. Electronically Signed   By: Inez Catalina M.D.   On: 06/29/2016 19:05   Dg Chest Port 1 View  Result Date: 06/29/2016 CLINICAL DATA:  B-cell lymphoma with syncope. History of brain surgery on 11/06 2017. Similar episode 3 weeks ago. EXAM: PORTABLE CHEST 1 VIEW COMPARISON:  None. FINDINGS: The heart is normal in size. There is aortic atherosclerosis without aneurysm. Right IJ central line catheter has been removed since prior exam. The patient is status post anterior cervical disc fusion. There is atelectasis along the periphery of the left lung base. No pneumonic consolidation, effusion or pneumothorax. Pulmonary vasculature is within normal limits. No suspicious osseous abnormalities. Chronic stable widened appearance of both AC joints. IMPRESSION: No active disease. Minimal atelectasis at the left lung base. Aortic atherosclerosis. Electronically Signed   By: Ashley Royalty M.D.   On: 06/29/2016  17:41        Scheduled Meds: . aspirin EC  81 mg Oral Daily  . atorvastatin  20 mg Oral Daily  . cefTRIAXone (ROCEPHIN)  IV  1 g Intravenous Q24H  . citalopram  20 mg Oral Daily  . insulin aspart  0-9 Units  Subcutaneous TID WC  . levETIRAcetam  500 mg Intravenous BID  . mometasone-formoterol  2 puff Inhalation BID  . pantoprazole  40 mg Oral Daily  . potassium chloride  40 mEq Oral Q4H while awake   Continuous Infusions: . sodium chloride       LOS: 0 days    Time spent: 25 min    [place], DO [facility] Pager [phone]  If 7PM-7AM, please contact night-coverage [email or web address] Password [email or web address] 06/30/2016, 1:44 PM

## 2016-07-01 DIAGNOSIS — L8990 Pressure ulcer of unspecified site, unspecified stage: Secondary | ICD-10-CM | POA: Insufficient documentation

## 2016-07-01 DIAGNOSIS — G4733 Obstructive sleep apnea (adult) (pediatric): Secondary | ICD-10-CM

## 2016-07-01 DIAGNOSIS — I10 Essential (primary) hypertension: Secondary | ICD-10-CM

## 2016-07-01 LAB — COMP PANEL: LEUKEMIA/LYMPHOMA

## 2016-07-01 LAB — URINE CULTURE: CULTURE: NO GROWTH

## 2016-07-01 LAB — BASIC METABOLIC PANEL
Anion gap: 11 (ref 5–15)
BUN: 24 mg/dL — ABNORMAL HIGH (ref 6–20)
CALCIUM: 9.3 mg/dL (ref 8.9–10.3)
CO2: 22 mmol/L (ref 22–32)
CREATININE: 0.93 mg/dL (ref 0.44–1.00)
Chloride: 105 mmol/L (ref 101–111)
GFR calc non Af Amer: >60 mL/min (ref >60)
Glucose, Bld: 109 mg/dL — ABNORMAL HIGH (ref 65–99)
Potassium: 3.6 mmol/L (ref 3.5–5.1)
SODIUM: 138 mmol/L (ref 135–145)

## 2016-07-01 LAB — MAGNESIUM: MAGNESIUM: 1.8 mg/dL (ref 1.7–2.4)

## 2016-07-01 LAB — CBC
HCT: 41 % (ref 36.0–46.0)
Hemoglobin: 14.1 g/dL (ref 12.0–15.0)
MCH: 30.5 pg (ref 26.0–34.0)
MCHC: 34.4 g/dL (ref 30.0–36.0)
MCV: 88.7 fL (ref 78.0–100.0)
PLATELETS: 191 10*3/uL (ref 150–400)
RBC: 4.62 MIL/uL (ref 3.87–5.11)
RDW: 14.2 % (ref 11.5–15.5)
WBC: 8.1 10*3/uL (ref 4.0–10.5)

## 2016-07-01 LAB — GLUCOSE, CAPILLARY: Glucose-Capillary: 101 mg/dL — ABNORMAL HIGH (ref 65–99)

## 2016-07-01 NOTE — Discharge Summary (Signed)
Physician Discharge Summary  Kristi Kramer:[id number] DOB: 1946-07-16 DOA: 06/29/2016  PCP: Cyndi Bender, PA-C  Admit date: 06/29/2016 Discharge date: 07/01/2016   Recommendations for Outpatient Follow-Up:   Keep appointment with oncology for LP/PET scan results-- may need hospice/pallaitive refreral  Discharge Diagnosis:   Principal Problem:   Syncope Active Problems:   Type 2 diabetes mellitus ([place])   Essential hypertension   OSA (obstructive sleep apnea)   AKI (acute kidney injury) ([place])   Seizures ([place])   Pressure injury of skin   Discharge disposition:  Home  Discharge Condition: Improved.  Diet recommendation: Low sodium, heart healthy.  Wound care: None.   History of Present Illness:   Kristi Kramer is a 70 y.o. 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 As per the boyfriend patient was recently diagnosed with UTI and was on Levaquin. Patient has not been eating well last few days and has been feeling weak. While waiting in the [place] patient felt weak and slowly slid to the floor but did not hit her head. Loss consciousness for a few seconds and regained back. Did not have any incontinence of urine or seizure-like activities or tongue bite. Patient appeared confused on arrival. Family states she has been confused last few days. Patient's boyfriend states she has been taking all her medications as prescribed including Keppra. CT scan of the head does not show anything acute. EKG shows normal sinus rhythm with T-wave inversions in the anterior leads with ST changes comparable to the old EKG. Patient is being admitted for further management of  syncope. Labs in addition show UTI and acute renal failure. On exam patient is still lethargic but follows commands and is oriented to time place and person. Moves all extremities. ABG does not show any hypercarbia and carbon monoxide was slightly elevated and I did discuss with pulmonary critical care which as per the pulmonary disease was not in critical range.    Hospital Course by Problem:   Syncope- due to dehydration and UTI- IVF and Cr resolved to baseline-- urine treated with both PO and IV abx Physical therapy consult-- home health  Acute renal failure probably from poor oral intake - hydrate and recheck metabolic panel. Hold hydrochlorothiazide.  Hypokalemia -replace Po -Mg ok  UTI- I have placed patient on ceftriaxone. Check urine cultures.  Obstructive sleep apnea- on CPAP at bedtime. ABG does not show any hypercarbia. Carbon monoxide level is mildly elevated. This could be from tobacco use as per the pulmonary critical care.  History of seizuresandbrain lymphoma- continue keppra  COPDpresently not wheezing.  Diabetes mellitus-resume home meds  Hypertension- d/c hydrochlorothiazide secondary to dehydration    Medical Consultants:    Neuro (discussed)   Discharge Exam:   Vitals:   06/30/16 2137 07/01/16 0604  BP: 129/71 (!) 151/93  Pulse: 72 93  Resp: 18 20  Temp: 99 F (37.2 C) 98 F (36.7 C)   Vitals:   06/30/16 2046 06/30/16 2137 07/01/16 0500 07/01/16 0604  BP:  129/71  (!) 151/93  Pulse: 78 72  93  Resp: '16 18  20  '$ Temp:  99 F (37.2 C)  98 F (36.7 C)  TempSrc:  Oral  SpO2:  96%  100%  Weight:   79 kg (174 lb 3.2 oz)   Height:        Gen:  NAD-appears to be back at baseline-- discussed with significant other at bedside   The results of significant diagnostics from this hospitalization (including imaging, microbiology, ancillary and laboratory) are listed below for reference.     Procedures and Diagnostic Studies:    Ct Head Wo Contrast  Result Date: 06/29/2016 CLINICAL DATA:  Recent syncopal episode, history of B-cell lymphoma EXAM: CT HEAD WITHOUT CONTRAST TECHNIQUE: Contiguous axial images were obtained from the base of the skull through the vertex without intravenous contrast. COMPARISON:  05/21/2016 FINDINGS: Brain: Postsurgical changes are noted in the right occipital lobe consistent with the recent resection. Scattered areas of decreased attenuation are noted within the frontal lobes bilaterally slightly greater on the left, right frontal parietal region near the vertex and in the region of the right basal ganglia. These are similar to that seen on recent MRI examination. No acute hemorrhage or acute infarct is seen. Vascular: No hyperdense vessel or unexpected calcification. Skull: Postsurgical changes are noted in the right occipital lobe. Sinuses/Orbits: No acute finding. Other: None. IMPRESSION: Postsurgical changes are noted. Multiple areas of decreased attenuation are identified similar to that seen on prior MRI examination. No acute abnormality is seen. Electronically Signed   By: Inez Catalina M.D.   On: 06/29/2016 19:05   Dg Chest Port 1 View  Result Date: 06/29/2016 CLINICAL DATA:  B-cell lymphoma with syncope. History of brain surgery on 11/06 2017. Similar episode 3 weeks ago. EXAM: PORTABLE CHEST 1 VIEW COMPARISON:  None. FINDINGS: The heart is normal in size. There is aortic atherosclerosis without aneurysm. Right IJ central line catheter has been removed since prior exam. The patient is status post anterior cervical disc fusion. There is atelectasis along the periphery of the left lung base. No pneumonic consolidation, effusion or pneumothorax. Pulmonary vasculature is within normal limits. No suspicious osseous abnormalities. Chronic stable widened appearance of both AC joints. IMPRESSION: No active disease. Minimal atelectasis at the left lung base. Aortic atherosclerosis. Electronically Signed    By: Ashley Royalty M.D.   On: 06/29/2016 17:41     Labs:   Basic Metabolic Panel:  Recent Labs Lab 06/29/16 1645 06/30/16 0241 07/01/16 0535  NA 135 134* 138  K 4.0 3.0* 3.6  CL 97* 97* 105  CO2 '24 25 22  '$ GLUCOSE 156* 174* 109*  BUN 42* 42* 24*  CREATININE 1.67* 1.48* 0.93  CALCIUM 9.9 9.5 9.3  MG  --   --  1.8   GFR Estimated Creatinine Clearance: 60.1 mL/min (by C-G formula based on SCr of 0.93 mg/dL). Liver Function Tests:  Recent Labs Lab 06/29/16 1645 06/30/16 0241  AST 27 24  ALT 22 18  ALKPHOS 75 70  BILITOT 1.2 0.6  PROT 6.6 6.3*  ALBUMIN 3.8 3.6   No results for input(s): LIPASE, AMYLASE in the last 168 hours.  Recent Labs Lab 06/29/16 2112  AMMONIA 29   Coagulation profile No results for input(s): INR, PROTIME in the last 168 hours.  CBC:  Recent Labs Lab 06/26/16 0831 06/29/16 1645 06/30/16 0241 07/01/16 0535  WBC 11.9* 10.2 10.0 8.1  NEUTROABS 9.8*  --  8.7*  --   HGB 14.2 14.6 13.8 14.1  HCT 41.0 41.7 39.6 41.0  MCV 86.7 86.7 86.7 88.7  PLT 217 245 225 191   Cardiac Enzymes:  Recent Labs Lab 06/29/16 2109  06/30/16 0241 06/30/16 0844  TROPONINI 0.03* 0.03* <0.03   BNP: Invalid input(s): POCBNP CBG:  Recent Labs Lab 06/30/16 0752 06/30/16 1135 06/30/16 1654 06/30/16 2135 07/01/16 0801  GLUCAP 134* 154* 139* 174* 101*   D-Dimer No results for input(s): DDIMER in the last 72 hours. Hgb A1c No results for input(s): HGBA1C in the last 72 hours. Lipid Profile No results for input(s): CHOL, HDL, LDLCALC, TRIG, CHOLHDL, LDLDIRECT in the last 72 hours. Thyroid function studies  Recent Labs  06/29/16 2111  TSH 0.727   Anemia work up No results for input(s): VITAMINB12, FOLATE, FERRITIN, TIBC, IRON, RETICCTPCT in the last 72 hours. Microbiology Recent Results (from the past 240 hour(s))  Urine culture     Status: Abnormal   Collection Time: 06/26/16 10:36 AM  Result Value Ref Range Status   Specimen Description  URINE, CLEAN CATCH  Final   Special Requests NONE  Final   Culture >=100,000 COLONIES/mL ESCHERICHIA COLI (A)  Final   Report Status 06/29/2016 FINAL  Final   Organism ID, Bacteria ESCHERICHIA COLI (A)  Final      Susceptibility   Escherichia coli - MIC*    AMPICILLIN >=32 RESISTANT Resistant     CEFAZOLIN 16 SENSITIVE Sensitive     CEFTRIAXONE <=1 SENSITIVE Sensitive     CIPROFLOXACIN <=0.25 SENSITIVE Sensitive     GENTAMICIN <=1 SENSITIVE Sensitive     IMIPENEM <=0.25 SENSITIVE Sensitive     NITROFURANTOIN <=16 SENSITIVE Sensitive     TRIMETH/SULFA <=20 SENSITIVE Sensitive     AMPICILLIN/SULBACTAM >=32 RESISTANT Resistant     PIP/TAZO <=4 SENSITIVE Sensitive     Extended ESBL NEGATIVE Sensitive     * >=100,000 COLONIES/mL ESCHERICHIA COLI     Discharge Instructions:   Discharge Instructions    Diet - low sodium heart healthy    Complete by:  As directed    Diet Carb Modified    Complete by:  As directed    Discharge instructions    Complete by:  As directed    24 hour supervision [facility] Keep appointment with oncology   Increase activity slowly    Complete by:  As directed        Medication List    STOP taking these medications   dexamethasone 2 MG tablet Commonly known as:  DECADRON   hydrochlorothiazide 25 MG tablet Commonly known as:  HYDRODIURIL   levofloxacin 500 MG tablet Commonly known as:  LEVAQUIN   topiramate 50 MG tablet Commonly known as:  TOPAMAX     TAKE these medications   ADVAIR DISKUS 250-50 MCG/DOSE Aepb Generic drug:  Fluticasone-Salmeterol Inhale 1 puff into the lungs 2 (two) times daily.   albuterol (2.5 MG/3ML) 0.083% nebulizer solution Commonly known as:  PROVENTIL Take 2.5 mg by nebulization 3 (three) times daily as needed for wheezing or shortness of breath.   VENTOLIN HFA 108 (90 Base) MCG/ACT inhaler Generic drug:  albuterol Inhale 2 puffs into the lungs every 6 (six) hours as needed. wheezing   aspirin EC 81  MG tablet Take 81 mg by mouth daily.   atorvastatin 20 MG tablet Commonly known as:  LIPITOR Take 20 mg by mouth daily.   citalopram 20 MG tablet Commonly known as:  CELEXA Take 20 mg by mouth daily.   esomeprazole 20 MG capsule Commonly known as:  NEXIUM Take 20 mg by mouth daily at 12 noon.   HYDROcodone-acetaminophen 5-325 MG tablet Commonly known as:  NORCO/VICODIN Take 1-2 tablets  by mouth every 4 (four) hours as needed for moderate pain.   levETIRAcetam 500 MG tablet Commonly known as:  KEPPRA Take 1 tablet (500 mg total) by mouth 2 (two) times daily.   metFORMIN 1000 MG tablet Commonly known as:  GLUCOPHAGE Take 1,000 mg by mouth 2 (two) times daily with a meal.   OXYGEN Inhale into the lungs.   Vitamin D (Ergocalciferol) 50000 units Caps capsule Commonly known as:  DRISDOL Take 50,000 Units by mouth every 7 (seven) days.         Time coordinating discharge: 35 min  Signed:  Gerrett Loman U Jarrett Albor   [facility] 07/01/2016, 8:50 AM

## 2016-07-01 NOTE — Progress Notes (Signed)
Pt discharged home with spouse. Discharge instructions given to caregiver and all questions answered. Pt given all belongings and sent home via private vehicle.

## 2016-07-01 NOTE — Care Management Note (Addendum)
Case Management Note  Patient Details  Name: Kristi Kramer MRN: [id number] Date of Birth: 11-21-1945  Subjective/Objective:          Presents  with syncope,history of diabetes mellitus, hypertension, COPD, sleep apnea, tobacco abuse, seizures who was recently admitted November 2017 for seizures andCNS lymphoma. Pt states resides with friend, Kristi Kramer. Prior to hospital visit requires some assistance with ADL's, and owns a cane and walker. PCP's office had made referral with [facility] for home health services (RN,PT,OT,NA)) prior to hospital visit.   PCP: Cyndi Bender  Action/Plan: Plan is to d/c today with home health services in place.  Expected Discharge Date:     07/01/2016           Expected Discharge Plan:  [place]  In-House Referral:     Discharge planning Services  CM Consult  Post Acute Care Choice:    Choice offered to:  Patient  DME Arranged:    DME Agency:     [place] Arranged:  PT,OT,RN,NA ( arrangements were made PTA  though PCP's office)/ Butch Penny @ [phone]  [place]:  [facility]  Status of Service:  completed  If discussed at [place] of Stay Meetings, dates discussed:    Additional Comments: Discharge summary and H&P faxed to Butch Penny [facility]) [phone] per CM  Sharin Mons, RN 07/01/2016, 8:33 AM

## 2016-07-02 DIAGNOSIS — K219 Gastro-esophageal reflux disease without esophagitis: Secondary | ICD-10-CM | POA: Diagnosis not present

## 2016-07-02 DIAGNOSIS — I69354 Hemiplegia and hemiparesis following cerebral infarction affecting left non-dominant side: Secondary | ICD-10-CM | POA: Diagnosis not present

## 2016-07-02 DIAGNOSIS — D869 Sarcoidosis, unspecified: Secondary | ICD-10-CM | POA: Diagnosis not present

## 2016-07-02 DIAGNOSIS — E119 Type 2 diabetes mellitus without complications: Secondary | ICD-10-CM | POA: Diagnosis not present

## 2016-07-02 DIAGNOSIS — G40909 Epilepsy, unspecified, not intractable, without status epilepticus: Secondary | ICD-10-CM | POA: Diagnosis not present

## 2016-07-02 DIAGNOSIS — C8330 Diffuse large B-cell lymphoma, unspecified site: Secondary | ICD-10-CM | POA: Diagnosis not present

## 2016-07-02 DIAGNOSIS — I10 Essential (primary) hypertension: Secondary | ICD-10-CM | POA: Diagnosis not present

## 2016-07-02 DIAGNOSIS — J449 Chronic obstructive pulmonary disease, unspecified: Secondary | ICD-10-CM | POA: Diagnosis not present

## 2016-07-02 DIAGNOSIS — I83819 Varicose veins of unspecified lower extremities with pain: Secondary | ICD-10-CM | POA: Diagnosis not present

## 2016-07-02 DIAGNOSIS — F17210 Nicotine dependence, cigarettes, uncomplicated: Secondary | ICD-10-CM | POA: Diagnosis not present

## 2016-07-02 DIAGNOSIS — F418 Other specified anxiety disorders: Secondary | ICD-10-CM | POA: Diagnosis not present

## 2016-07-02 DIAGNOSIS — Z79899 Other long term (current) drug therapy: Secondary | ICD-10-CM | POA: Diagnosis not present

## 2016-07-02 DIAGNOSIS — N390 Urinary tract infection, site not specified: Secondary | ICD-10-CM | POA: Diagnosis not present

## 2016-07-02 DIAGNOSIS — K760 Fatty (change of) liver, not elsewhere classified: Secondary | ICD-10-CM | POA: Diagnosis not present

## 2016-07-02 DIAGNOSIS — Z7984 Long term (current) use of oral hypoglycemic drugs: Secondary | ICD-10-CM | POA: Diagnosis not present

## 2016-07-02 DIAGNOSIS — Z9181 History of falling: Secondary | ICD-10-CM | POA: Diagnosis not present

## 2016-07-02 NOTE — Progress Notes (Signed)
[facility]  [phone] [phone]  ID: Kristi Kramer OB: 11/19/1945  MR#: [id number]  HFW#:[id number]  Patient Care Team: Lonie Peak, PA-C as PCP - General (Physician Assistant)  CHIEF COMPLAINT: Diffuse large B-cell lymphoma of brain.  INTERVAL HISTORY: Patient returns to clinic today for further evaluation, discussion of her diagnostic studies, and treatment planning. She was admitted to a hospital in [place] for concern of seizures, but ultimately determined that there was no seizure activity. She continues to have a significantly decreased performance status. She has "pain all over".  Currently, she has no neurologic complaints. She denies any recent fevers. She has a poor appetite. She continues to have persistent weakness and fatigue. She denies any fevers, chills, night sweats, or weight loss. She has no chest pain or shortness of breath. She denies any nausea, vomiting, constipation, or diarrhea. She has no urinary complaints. Patient offers no further specific complaints.  REVIEW OF SYSTEMS:   Review of Systems  Constitutional: Positive for malaise/fatigue. Negative for chills, diaphoresis, fever and weight loss.  Respiratory: Negative.  Negative for cough and shortness of breath.   Cardiovascular: Negative.  Negative for chest pain and leg swelling.  Gastrointestinal: Negative.  Negative for abdominal pain.  Genitourinary: Negative.   Musculoskeletal: Negative.   Neurological: Positive for loss of consciousness and weakness. Negative for dizziness, seizures and headaches.  Psychiatric/Behavioral: Positive for depression. The patient is nervous/anxious.     As per HPI. Otherwise, a complete review of systems is negative.  PAST MEDICAL HISTORY: Past Medical History:  Diagnosis Date  . Anxiety   . Arthritis   . Asthma   . Cataract   . COPD (chronic obstructive pulmonary disease) (HCC)    sarcoidosis  . Depression   . Diabetes  mellitus   . GERD (gastroesophageal reflux disease)   . High cholesterol   . Hypertension   . Insomnia   . Migraine   . Shortness of breath dyspnea   . Sleep apnea    cpap  . 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 ABDOMINAL HYSTERECTOMY    . APPENDECTOMY    . BACK SURGERY    . CHOLECYSTECTOMY    . COLONOSCOPY    . CRANIOTOMY N/A 05/26/2016   Procedure: CRANIOTOMY TUMOR EXCISION;  Surgeon: Tressie Stalker, MD;  Location: [facility] OR;  Service: Neurosurgery;  Laterality: N/A;  CRANIOTOMY TUMOR EXCISION  . EAR EXAMINATION UNDER ANESTHESIA Left    plate  . NECK SURGERY    . RADIOLOGY WITH ANESTHESIA N/A 05/21/2016   Procedure: RADIOLOGY WITH ANESTHESIA - MRI;  Surgeon: Medication Radiologist, MD;  Location: [facility] OR;  Service: Radiology;  Laterality: N/A;    FAMILY HISTORY: Family History  Problem Relation Age of Onset  . Liver cancer Father   . Heart disease Sister   . Other Mother     blood clots  . Diabetes Mother   . Heart disease Mother     ADVANCED DIRECTIVES (Y/N):  N  HEALTH MAINTENANCE: Social History  Substance Use Topics  . Smoking status: Light Tobacco Smoker    Packs/day: 0.25    Years: 22.00    Types: Cigarettes  . Smokeless tobacco: Never Used     Comment: wants patches to quit  . Alcohol use No     Colonoscopy:  PAP:  Bone density:  Lipid panel:  Allergies  Allergen Reactions  . Mometasone  Anaphylaxis and Rash    Pt states that this does not cause anaphylaxis  . Codeine Nausea And Vomiting  . Nasonex [Mometasone Furoate] Rash  . Sulfa Antibiotics Nausea And Vomiting    Current Outpatient Prescriptions  Medication Sig Dispense Refill  . ADVAIR DISKUS 250-50 MCG/DOSE AEPB Inhale 1 puff into the lungs 2 (two) times daily.  11  . albuterol (PROVENTIL) (2.5 MG/3ML) 0.083% nebulizer solution Take 2.5 mg by nebulization 3 (three) times daily as needed for wheezing or shortness of  breath.    Marland Kitchen aspirin EC 81 MG tablet Take 81 mg by mouth daily.     Marland Kitchen atorvastatin (LIPITOR) 20 MG tablet Take 20 mg by mouth daily.   1  . citalopram (CELEXA) 20 MG tablet Take 20 mg by mouth daily.    Marland Kitchen esomeprazole (NEXIUM) 20 MG capsule Take 20 mg by mouth daily at 12 noon.     . fluticasone (FLONASE) 50 MCG/ACT nasal spray     . HYDROcodone-acetaminophen (NORCO/VICODIN) 5-325 MG tablet Take 1-2 tablets by mouth every 4 (four) hours as needed for moderate pain. 50 tablet 0  . levETIRAcetam (KEPPRA) 500 MG tablet Take 1 tablet (500 mg total) by mouth 2 (two) times daily. 60 tablet 1  . metFORMIN (GLUCOPHAGE) 1000 MG tablet Take 1,000 mg by mouth 2 (two) times daily with a meal.   3  . VENTOLIN HFA 108 (90 BASE) MCG/ACT inhaler Inhale 2 puffs into the lungs every 6 (six) hours as needed. wheezing  3  . Vitamin D, Ergocalciferol, (DRISDOL) 50000 units CAPS capsule Take 50,000 Units by mouth every 7 (seven) days.    Marland Kitchen dexamethasone (DECADRON) 4 MG tablet Take 1 tablet (4 mg total) by mouth 2 (two) times daily. 60 tablet 1   No current facility-administered medications for this visit.     OBJECTIVE: Vitals:   07/03/16 1458  BP: 118/74  Pulse: 84  Resp: 18  Temp: 98 F (36.7 C)     Body mass index is 28.86 kg/m.    ECOG FS:3 - Symptomatic, >50% confined to bed  General: Well-developed, well-nourished, no acute distress. Eyes: Pink conjunctiva, anicteric sclera. HEENT: Normocephalic, moist mucous membranes, clear oropharnyx. Lungs: Clear to auscultation bilaterally. Heart: Regular rate and rhythm. No rubs, murmurs, or gallops. Abdomen: Soft, nontender, nondistended. No organomegaly noted, normoactive bowel sounds. Musculoskeletal: No edema, cyanosis, or clubbing. Neuro: Alert, answering all questions appropriately. Cranial nerves grossly intact. Skin: No rashes or petechiae noted. Psych: Normal affect. Lymphatics: No cervical, calvicular, axillary or inguinal LAD.   LAB  RESULTS:  Lab Results  Component Value Date   NA 138 07/01/2016   K 3.6 07/01/2016   CL 105 07/01/2016   CO2 22 07/01/2016   GLUCOSE 109 (H) 07/01/2016   BUN 24 (H) 07/01/2016   CREATININE 0.93 07/01/2016   CALCIUM 9.3 07/01/2016   PROT 6.3 (L) 06/30/2016   ALBUMIN 3.6 06/30/2016   AST 24 06/30/2016   ALT 18 06/30/2016   ALKPHOS 70 06/30/2016   BILITOT 0.6 06/30/2016   GFRNONAA >60 07/01/2016   GFRAA >60 07/01/2016    Lab Results  Component Value Date   WBC 8.1 07/01/2016   NEUTROABS 8.7 (H) 06/30/2016   HGB 14.1 07/01/2016   HCT 41.0 07/01/2016   MCV 88.7 07/01/2016   PLT 191 07/01/2016     STUDIES: Ct Head Wo Contrast  Result Date: 06/29/2016 CLINICAL DATA:  Recent syncopal episode, history of B-cell lymphoma EXAM: CT HEAD WITHOUT CONTRAST TECHNIQUE:  Contiguous axial images were obtained from the base of the skull through the vertex without intravenous contrast. COMPARISON:  05/21/2016 FINDINGS: Brain: Postsurgical changes are noted in the right occipital lobe consistent with the recent resection. Scattered areas of decreased attenuation are noted within the frontal lobes bilaterally slightly greater on the left, right frontal parietal region near the vertex and in the region of the right basal ganglia. These are similar to that seen on recent MRI examination. No acute hemorrhage or acute infarct is seen. Vascular: No hyperdense vessel or unexpected calcification. Skull: Postsurgical changes are noted in the right occipital lobe. Sinuses/Orbits: No acute finding. Other: None. IMPRESSION: Postsurgical changes are noted. Multiple areas of decreased attenuation are identified similar to that seen on prior MRI examination. No acute abnormality is seen. Electronically Signed   By: Inez Catalina M.D.   On: 06/29/2016 19:05   Nm Pet Image Initial (pi) Skull Base To Thigh  Result Date: 06/24/2016 CLINICAL DATA:  Initial treatment strategy for diffuse large B-cell lymphoma. EXAM:  NUCLEAR MEDICINE PET SKULL BASE TO THIGH TECHNIQUE: 12.8 mCi F-18 FDG was injected intravenously. Full-ring PET imaging was performed from the skull base to thigh after the radiotracer. CT data was obtained and used for attenuation correction and anatomic localization. FASTING BLOOD GLUCOSE:  Value: 104 mg/dl COMPARISON:  CAP CT on 05/22/2016 FINDINGS: NECK No hypermetabolic lymph nodes in the neck. CHEST No hypermetabolic mediastinal or hilar nodes. No suspicious pulmonary nodules on the CT scan. ABDOMEN/PELVIS No abnormal hypermetabolic activity within the liver, pancreas, adrenal glands, or spleen. No hypermetabolic lymph nodes in the abdomen or pelvis. Aortic atherosclerosis. Sigmoid colon diverticulosis again noted, without evidence of diverticulitis. Insert hysterectomy. SKELETON No focal hypermetabolic activity to suggest skeletal metastasis. Right suboccipital craniotomy defect incidentally noted. Lower cervical spine fusion hardware and benign L2 vertebral body hemangioma also noted. IMPRESSION: No evidence of metabolically active lymphoma within the neck, chest, abdomen, or pelvis. Electronically Signed   By: Earle Gell M.D.   On: 06/24/2016 16:11   Ct Biopsy  Result Date: 06/26/2016 INDICATION: B-cell lymphoma EXAM: CT BIOPSY MEDICATIONS: None. ANESTHESIA/SEDATION: Moderate (conscious) sedation was employed during this procedure. A total Fentanyl 50 mcg was administered intravenously. Moderate Sedation Time: 15 minutes. The patient's level of consciousness and vital signs were monitored continuously by radiology nursing throughout the procedure under my direct supervision. FLUOROSCOPY TIME:  Not applicable COMPLICATIONS: None immediate. PROCEDURE: Informed written consent was obtained from the patient after a thorough discussion of the procedural risks, benefits and alternatives. All questions were addressed. Maximal Sterile Barrier Technique was utilized including caps, mask, sterile gowns, sterile  gloves, sterile drape, hand hygiene and skin antiseptic. A timeout was performed prior to the initiation of the procedure. Utilizing 0.25% Marcaine as a local anesthetic and CT fluoroscopic guidance, an 11 gauge bone biopsy needle was placed into the right iliac bone. Initial aspiration biopsy was performed and deemed adequate by the cytotechnologist. Subsequently a core biopsy was obtained and also deemed adequate. The needle was then removed. Hemostasis was obtained at the puncture site. Puncture site was then dressed in the standard sterile manner. The patient tolerated the procedure well and was returned to her room in satisfactory condition. IMPRESSION: Successful CT-guided bone marrow biopsy. Electronically Signed   By: Inez Catalina M.D.   On: 06/26/2016 10:26   Dg Chest Port 1 View  Result Date: 06/29/2016 CLINICAL DATA:  B-cell lymphoma with syncope. History of brain surgery on 11/06 2017. Similar episode 3 weeks  ago. EXAM: PORTABLE CHEST 1 VIEW COMPARISON:  None. FINDINGS: The heart is normal in size. There is aortic atherosclerosis without aneurysm. Right IJ central line catheter has been removed since prior exam. The patient is status post anterior cervical disc fusion. There is atelectasis along the periphery of the left lung base. No pneumonic consolidation, effusion or pneumothorax. Pulmonary vasculature is within normal limits. No suspicious osseous abnormalities. Chronic stable widened appearance of both AC joints. IMPRESSION: No active disease. Minimal atelectasis at the left lung base. Aortic atherosclerosis. Electronically Signed   By: Ashley Royalty M.D.   On: 06/29/2016 17:41   Dg Fluoro Guided Loc Of Needle/cath Tip For Spinal Inject Lt  Result Date: 06/26/2016 CLINICAL DATA:  Lymphoma. EXAM: DIAGNOSTIC LUMBAR PUNCTURE UNDER FLUOROSCOPIC GUIDANCE FLUOROSCOPY TIME:  Fluoroscopy Time:  0 minutes 30 seconds Radiation Exposure Index (if provided by the fluoroscopic device): 12.10 mGy Number  of Acquired Spot Images: 1 PROCEDURE: Informed consent was obtained from the patient prior to the procedure, including potential complications of headache, allergy, and pain. With the patient prone, the lower back was prepped with Betadine. 1% Lidocaine was used for local anesthesia. Lumbar puncture was performed at the L3-L4 level using a 22 gauge needle with return of clear CSF. Ten ml of CSF were obtained for laboratory studies. The patient tolerated the procedure well and there were no apparent complications. IMPRESSION: Successful fluoroscopically guided lumbar puncture. Electronically Signed   By: Arthur   On: 06/26/2016 12:16    ASSESSMENT: Diffuse large B-cell lymphoma of brain.  PLAN:    1. Primary CNS Diffuse large B-cell lymphoma of brain: PET scan, bone marrow biopsy, an LP did not reveal any evidence of systemic disease. After lengthy discussion with the patient and her family, they do not wish aggressive chemotherapy at this time with either high-dose methotrexate or possibly Rituxan plus Revlimid. They have agreed to reinitiate dexamethasone at 4 mg twice per day. If patient's performance status improves, can reconsider more aggressive treatment at that time. No further interventions are needed. Return to clinic in 3 weeks for further evaluation. Previously, case was discussed with radiation oncology who did not feel XRT was appropriate at this time. 2. Poor appetite: Dexamethasone as above. 3. Pain: We will not initiate narcotics at this time. Dexamethasone as above and monitor.  Approximately 30 minutes was spent in discussion of which greater than 50% was consultation.  Patient expressed understanding and was in agreement with this plan. She also understands that She can call clinic at any time with any questions, concerns, or complaints.   No matching staging information was found for the patient.  Lloyd Huger, MD   07/06/2016 9:11 AM

## 2016-07-03 ENCOUNTER — Inpatient Hospital Stay: Payer: Medicare Other | Attending: Oncology | Admitting: Oncology

## 2016-07-03 VITALS — BP 118/74 | HR 84 | Temp 98.0°F | Resp 18 | Wt 173.4 lb

## 2016-07-03 DIAGNOSIS — Z79899 Other long term (current) drug therapy: Secondary | ICD-10-CM | POA: Insufficient documentation

## 2016-07-03 DIAGNOSIS — F17210 Nicotine dependence, cigarettes, uncomplicated: Secondary | ICD-10-CM | POA: Insufficient documentation

## 2016-07-03 DIAGNOSIS — Z8673 Personal history of transient ischemic attack (TIA), and cerebral infarction without residual deficits: Secondary | ICD-10-CM | POA: Diagnosis not present

## 2016-07-03 DIAGNOSIS — J449 Chronic obstructive pulmonary disease, unspecified: Secondary | ICD-10-CM | POA: Diagnosis not present

## 2016-07-03 DIAGNOSIS — K5730 Diverticulosis of large intestine without perforation or abscess without bleeding: Secondary | ICD-10-CM | POA: Diagnosis not present

## 2016-07-03 DIAGNOSIS — Z8669 Personal history of other diseases of the nervous system and sense organs: Secondary | ICD-10-CM | POA: Diagnosis not present

## 2016-07-03 DIAGNOSIS — R531 Weakness: Secondary | ICD-10-CM | POA: Insufficient documentation

## 2016-07-03 DIAGNOSIS — M129 Arthropathy, unspecified: Secondary | ICD-10-CM | POA: Insufficient documentation

## 2016-07-03 DIAGNOSIS — G4730 Sleep apnea, unspecified: Secondary | ICD-10-CM

## 2016-07-03 DIAGNOSIS — J45909 Unspecified asthma, uncomplicated: Secondary | ICD-10-CM | POA: Diagnosis not present

## 2016-07-03 DIAGNOSIS — E7800 Pure hypercholesterolemia, unspecified: Secondary | ICD-10-CM | POA: Diagnosis not present

## 2016-07-03 DIAGNOSIS — F319 Bipolar disorder, unspecified: Secondary | ICD-10-CM

## 2016-07-03 DIAGNOSIS — K219 Gastro-esophageal reflux disease without esophagitis: Secondary | ICD-10-CM | POA: Insufficient documentation

## 2016-07-03 DIAGNOSIS — I10 Essential (primary) hypertension: Secondary | ICD-10-CM | POA: Diagnosis not present

## 2016-07-03 DIAGNOSIS — R55 Syncope and collapse: Secondary | ICD-10-CM | POA: Insufficient documentation

## 2016-07-03 DIAGNOSIS — G4700 Insomnia, unspecified: Secondary | ICD-10-CM | POA: Diagnosis not present

## 2016-07-03 DIAGNOSIS — Z9049 Acquired absence of other specified parts of digestive tract: Secondary | ICD-10-CM | POA: Diagnosis not present

## 2016-07-03 DIAGNOSIS — Z800 Family history of malignant neoplasm of digestive organs: Secondary | ICD-10-CM | POA: Insufficient documentation

## 2016-07-03 DIAGNOSIS — R630 Anorexia: Secondary | ICD-10-CM | POA: Diagnosis not present

## 2016-07-03 DIAGNOSIS — R52 Pain, unspecified: Secondary | ICD-10-CM | POA: Insufficient documentation

## 2016-07-03 DIAGNOSIS — I700 Atherosclerosis of aorta: Secondary | ICD-10-CM | POA: Insufficient documentation

## 2016-07-03 DIAGNOSIS — D869 Sarcoidosis, unspecified: Secondary | ICD-10-CM | POA: Diagnosis not present

## 2016-07-03 DIAGNOSIS — Z7984 Long term (current) use of oral hypoglycemic drugs: Secondary | ICD-10-CM | POA: Insufficient documentation

## 2016-07-03 DIAGNOSIS — C8330 Diffuse large B-cell lymphoma, unspecified site: Secondary | ICD-10-CM | POA: Diagnosis not present

## 2016-07-03 DIAGNOSIS — F329 Major depressive disorder, single episode, unspecified: Secondary | ICD-10-CM | POA: Insufficient documentation

## 2016-07-03 DIAGNOSIS — F419 Anxiety disorder, unspecified: Secondary | ICD-10-CM | POA: Diagnosis not present

## 2016-07-03 DIAGNOSIS — Z9889 Other specified postprocedural states: Secondary | ICD-10-CM | POA: Insufficient documentation

## 2016-07-03 DIAGNOSIS — R5383 Other fatigue: Secondary | ICD-10-CM | POA: Insufficient documentation

## 2016-07-03 DIAGNOSIS — E119 Type 2 diabetes mellitus without complications: Secondary | ICD-10-CM | POA: Diagnosis not present

## 2016-07-03 DIAGNOSIS — C8331 Diffuse large B-cell lymphoma, lymph nodes of head, face, and neck: Secondary | ICD-10-CM

## 2016-07-03 DIAGNOSIS — Z7982 Long term (current) use of aspirin: Secondary | ICD-10-CM | POA: Insufficient documentation

## 2016-07-03 LAB — CYTOLOGY - NON PAP

## 2016-07-03 MED ORDER — DEXAMETHASONE 4 MG PO TABS
4.0000 mg | ORAL_TABLET | Freq: Two times a day (BID) | ORAL | 1 refills | Status: AC
Start: 2016-07-03 — End: ?

## 2016-07-03 NOTE — Progress Notes (Signed)
Here for follow up with family. Per daughter Barbera Setters pt had UTI and dehydration " passed out " at [facility] by ambulance to [facility]  Admitted sun dec 10-12th  Not eating . Pain meds gone as of dec 13th -need refill. Unclear last BM . Still has UTI and per daughter x 1 month-on no meds now for this./no abx.

## 2016-07-04 DIAGNOSIS — N390 Urinary tract infection, site not specified: Secondary | ICD-10-CM | POA: Diagnosis not present

## 2016-07-04 DIAGNOSIS — G40909 Epilepsy, unspecified, not intractable, without status epilepticus: Secondary | ICD-10-CM | POA: Diagnosis not present

## 2016-07-04 DIAGNOSIS — E119 Type 2 diabetes mellitus without complications: Secondary | ICD-10-CM | POA: Diagnosis not present

## 2016-07-04 DIAGNOSIS — C8330 Diffuse large B-cell lymphoma, unspecified site: Secondary | ICD-10-CM | POA: Diagnosis not present

## 2016-07-04 DIAGNOSIS — K219 Gastro-esophageal reflux disease without esophagitis: Secondary | ICD-10-CM | POA: Diagnosis not present

## 2016-07-04 DIAGNOSIS — I69354 Hemiplegia and hemiparesis following cerebral infarction affecting left non-dominant side: Secondary | ICD-10-CM | POA: Diagnosis not present

## 2016-07-07 ENCOUNTER — Ambulatory Visit: Payer: Medicare Other | Admitting: Oncology

## 2016-07-07 DIAGNOSIS — E119 Type 2 diabetes mellitus without complications: Secondary | ICD-10-CM | POA: Diagnosis not present

## 2016-07-07 DIAGNOSIS — K219 Gastro-esophageal reflux disease without esophagitis: Secondary | ICD-10-CM | POA: Diagnosis not present

## 2016-07-07 DIAGNOSIS — N390 Urinary tract infection, site not specified: Secondary | ICD-10-CM | POA: Diagnosis not present

## 2016-07-07 DIAGNOSIS — I69354 Hemiplegia and hemiparesis following cerebral infarction affecting left non-dominant side: Secondary | ICD-10-CM | POA: Diagnosis not present

## 2016-07-07 DIAGNOSIS — C8330 Diffuse large B-cell lymphoma, unspecified site: Secondary | ICD-10-CM | POA: Diagnosis not present

## 2016-07-07 DIAGNOSIS — G40909 Epilepsy, unspecified, not intractable, without status epilepticus: Secondary | ICD-10-CM | POA: Diagnosis not present

## 2016-07-09 ENCOUNTER — Other Ambulatory Visit: Payer: Self-pay | Admitting: *Deleted

## 2016-07-09 ENCOUNTER — Observation Stay
Admission: AD | Admit: 2016-07-09 | Discharge: 2016-07-10 | Disposition: A | Discharge status: Home / Self Care | Payer: Medicare Other | Source: Ambulatory Visit | Attending: Internal Medicine | Admitting: Internal Medicine

## 2016-07-09 ENCOUNTER — Inpatient Hospital Stay: Payer: Medicare Other

## 2016-07-09 ENCOUNTER — Other Ambulatory Visit: Payer: Self-pay

## 2016-07-09 VITALS — BP 140/75 | HR 66 | Temp 99.0°F | Resp 18

## 2016-07-09 DIAGNOSIS — C8330 Diffuse large B-cell lymphoma, unspecified site: Secondary | ICD-10-CM | POA: Diagnosis not present

## 2016-07-09 DIAGNOSIS — Z79899 Other long term (current) drug therapy: Secondary | ICD-10-CM | POA: Insufficient documentation

## 2016-07-09 DIAGNOSIS — D869 Sarcoidosis, unspecified: Secondary | ICD-10-CM | POA: Insufficient documentation

## 2016-07-09 DIAGNOSIS — R399 Unspecified symptoms and signs involving the genitourinary system: Secondary | ICD-10-CM

## 2016-07-09 DIAGNOSIS — R52 Pain, unspecified: Secondary | ICD-10-CM | POA: Diagnosis not present

## 2016-07-09 DIAGNOSIS — R531 Weakness: Secondary | ICD-10-CM | POA: Diagnosis not present

## 2016-07-09 DIAGNOSIS — G4700 Insomnia, unspecified: Secondary | ICD-10-CM | POA: Insufficient documentation

## 2016-07-09 DIAGNOSIS — E860 Dehydration: Secondary | ICD-10-CM

## 2016-07-09 DIAGNOSIS — C8339 Diffuse large B-cell lymphoma, extranodal and solid organ sites: Principal | ICD-10-CM | POA: Insufficient documentation

## 2016-07-09 DIAGNOSIS — E119 Type 2 diabetes mellitus without complications: Secondary | ICD-10-CM | POA: Diagnosis not present

## 2016-07-09 DIAGNOSIS — G4733 Obstructive sleep apnea (adult) (pediatric): Secondary | ICD-10-CM | POA: Insufficient documentation

## 2016-07-09 DIAGNOSIS — Z7951 Long term (current) use of inhaled steroids: Secondary | ICD-10-CM | POA: Diagnosis not present

## 2016-07-09 DIAGNOSIS — R55 Syncope and collapse: Secondary | ICD-10-CM | POA: Diagnosis not present

## 2016-07-09 DIAGNOSIS — Z79891 Long term (current) use of opiate analgesic: Secondary | ICD-10-CM | POA: Insufficient documentation

## 2016-07-09 DIAGNOSIS — R112 Nausea with vomiting, unspecified: Secondary | ICD-10-CM | POA: Diagnosis present

## 2016-07-09 DIAGNOSIS — C8589 Other specified types of non-Hodgkin lymphoma, extranodal and solid organ sites: Secondary | ICD-10-CM | POA: Diagnosis not present

## 2016-07-09 DIAGNOSIS — F419 Anxiety disorder, unspecified: Secondary | ICD-10-CM | POA: Diagnosis not present

## 2016-07-09 DIAGNOSIS — Z7982 Long term (current) use of aspirin: Secondary | ICD-10-CM | POA: Insufficient documentation

## 2016-07-09 DIAGNOSIS — R5383 Other fatigue: Secondary | ICD-10-CM | POA: Diagnosis not present

## 2016-07-09 DIAGNOSIS — R627 Adult failure to thrive: Secondary | ICD-10-CM | POA: Insufficient documentation

## 2016-07-09 DIAGNOSIS — E7800 Pure hypercholesterolemia, unspecified: Secondary | ICD-10-CM | POA: Diagnosis not present

## 2016-07-09 DIAGNOSIS — K219 Gastro-esophageal reflux disease without esophagitis: Secondary | ICD-10-CM | POA: Insufficient documentation

## 2016-07-09 DIAGNOSIS — F329 Major depressive disorder, single episode, unspecified: Secondary | ICD-10-CM | POA: Insufficient documentation

## 2016-07-09 DIAGNOSIS — J449 Chronic obstructive pulmonary disease, unspecified: Secondary | ICD-10-CM | POA: Diagnosis not present

## 2016-07-09 DIAGNOSIS — Z66 Do not resuscitate: Secondary | ICD-10-CM | POA: Diagnosis not present

## 2016-07-09 DIAGNOSIS — Z7984 Long term (current) use of oral hypoglycemic drugs: Secondary | ICD-10-CM | POA: Insufficient documentation

## 2016-07-09 DIAGNOSIS — Z8673 Personal history of transient ischemic attack (TIA), and cerebral infarction without residual deficits: Secondary | ICD-10-CM | POA: Insufficient documentation

## 2016-07-09 DIAGNOSIS — I10 Essential (primary) hypertension: Secondary | ICD-10-CM | POA: Diagnosis not present

## 2016-07-09 DIAGNOSIS — F17210 Nicotine dependence, cigarettes, uncomplicated: Secondary | ICD-10-CM | POA: Insufficient documentation

## 2016-07-09 DIAGNOSIS — R918 Other nonspecific abnormal finding of lung field: Secondary | ICD-10-CM | POA: Diagnosis not present

## 2016-07-09 DIAGNOSIS — R630 Anorexia: Secondary | ICD-10-CM | POA: Diagnosis not present

## 2016-07-09 DIAGNOSIS — R6251 Failure to thrive (child): Secondary | ICD-10-CM | POA: Diagnosis present

## 2016-07-09 DIAGNOSIS — R0602 Shortness of breath: Secondary | ICD-10-CM

## 2016-07-09 HISTORY — DX: Malignant (primary) neoplasm, unspecified: C80.1

## 2016-07-09 LAB — COMPREHENSIVE METABOLIC PANEL
ALBUMIN: 3.9 g/dL (ref 3.5–5.0)
ALK PHOS: 63 U/L (ref 38–126)
ALT: 22 U/L (ref 14–54)
AST: 38 U/L (ref 15–41)
Anion gap: 12 (ref 5–15)
BILIRUBIN TOTAL: 0.8 mg/dL (ref 0.3–1.2)
BUN: 30 mg/dL — AB (ref 6–20)
CALCIUM: 10 mg/dL (ref 8.9–10.3)
CO2: 23 mmol/L (ref 22–32)
Chloride: 95 mmol/L — ABNORMAL LOW (ref 101–111)
Creatinine, Ser: 0.84 mg/dL (ref 0.44–1.00)
GFR calc Af Amer: >60 mL/min (ref >60)
GFR calc non Af Amer: >60 mL/min (ref >60)
GLUCOSE: 137 mg/dL — AB (ref 65–99)
Potassium: 3.9 mmol/L (ref 3.5–5.1)
Sodium: 130 mmol/L — ABNORMAL LOW (ref 135–145)
TOTAL PROTEIN: 7.2 g/dL (ref 6.5–8.1)

## 2016-07-09 LAB — CBC WITH DIFFERENTIAL/PLATELET
BASOS PCT: 0 %
Basophils Absolute: 0 10*3/uL (ref 0–0.1)
Eosinophils Absolute: 0 10*3/uL (ref 0–0.7)
Eosinophils Relative: 0 %
HEMATOCRIT: 40 % (ref 35.0–47.0)
HEMOGLOBIN: 14.1 g/dL (ref 12.0–16.0)
Lymphocytes Relative: 14 %
Lymphs Abs: 1.3 10*3/uL (ref 1.0–3.6)
MCH: 30.4 pg (ref 26.0–34.0)
MCHC: 35.2 g/dL (ref 32.0–36.0)
MCV: 86.4 fL (ref 80.0–100.0)
MONOS PCT: 5 %
Monocytes Absolute: 0.5 10*3/uL (ref 0.2–0.9)
NEUTROS ABS: 7.9 10*3/uL — AB (ref 1.4–6.5)
NEUTROS PCT: 81 %
Platelets: 270 10*3/uL (ref 150–440)
RBC: 4.64 MIL/uL (ref 3.80–5.20)
RDW: 14.9 % — ABNORMAL HIGH (ref 11.5–14.5)
WBC: 9.8 10*3/uL (ref 3.6–11.0)

## 2016-07-09 LAB — MAGNESIUM: Magnesium: 1.8 mg/dL (ref 1.7–2.4)

## 2016-07-09 LAB — GLUCOSE, CAPILLARY: GLUCOSE-CAPILLARY: 105 mg/dL — AB (ref 65–99)

## 2016-07-09 MED ORDER — SENNOSIDES-DOCUSATE SODIUM 8.6-50 MG PO TABS: 1.0000 | ORAL_TABLET | Freq: Every evening | ORAL | Status: DC | PRN

## 2016-07-09 MED ORDER — SODIUM CHLORIDE 0.9 % IV SOLN
Freq: Once | INTRAVENOUS | Status: AC
  Administered 2016-07-09: 12:00:00 via INTRAVENOUS
  Filled 2016-07-09: qty 1000

## 2016-07-09 MED ORDER — ACETAMINOPHEN 650 MG RE SUPP: 650.0000 mg | Freq: Four times a day (QID) | RECTAL | Status: DC | PRN

## 2016-07-09 MED ORDER — MOMETASONE FURO-FORMOTEROL FUM 200-5 MCG/ACT IN AERO
2.0000 | INHALATION_SPRAY | Freq: Two times a day (BID) | RESPIRATORY_TRACT | Status: DC
  Administered 2016-07-09 – 2016-07-10 (×2): 2 via RESPIRATORY_TRACT
  Filled 2016-07-09: qty 8.8

## 2016-07-09 MED ORDER — ONDANSETRON HCL 4 MG PO TABS: 4.0000 mg | ORAL_TABLET | Freq: Four times a day (QID) | ORAL | Status: DC | PRN

## 2016-07-09 MED ORDER — LEVETIRACETAM 500 MG PO TABS
500.0000 mg | ORAL_TABLET | Freq: Two times a day (BID) | ORAL | Status: DC
  Administered 2016-07-10: 500 mg via ORAL
  Filled 2016-07-09 (×2): qty 1

## 2016-07-09 MED ORDER — ACETAMINOPHEN 325 MG PO TABS: 650.0000 mg | ORAL_TABLET | Freq: Four times a day (QID) | ORAL | Status: DC | PRN

## 2016-07-09 MED ORDER — ENOXAPARIN SODIUM 40 MG/0.4ML ~~LOC~~ SOLN
40.0000 mg | SUBCUTANEOUS | Status: DC
  Administered 2016-07-09: 23:00:00 40 mg via SUBCUTANEOUS
  Filled 2016-07-09: qty 0.4

## 2016-07-09 MED ORDER — CITALOPRAM HYDROBROMIDE 20 MG PO TABS
20.0000 mg | ORAL_TABLET | Freq: Every day | ORAL | Status: DC
  Administered 2016-07-09 – 2016-07-10 (×2): 20 mg via ORAL
  Filled 2016-07-09 (×2): qty 1

## 2016-07-09 MED ORDER — HYDROCODONE-ACETAMINOPHEN 5-325 MG PO TABS: 1.0000 | ORAL_TABLET | ORAL | Status: DC | PRN

## 2016-07-09 MED ORDER — PANTOPRAZOLE SODIUM 40 MG PO TBEC
40.0000 mg | DELAYED_RELEASE_TABLET | Freq: Every day | ORAL | Status: DC
  Administered 2016-07-09 – 2016-07-10 (×2): 40 mg via ORAL
  Filled 2016-07-09 (×2): qty 1

## 2016-07-09 MED ORDER — INSULIN ASPART 100 UNIT/ML ~~LOC~~ SOLN: 0.0000 [IU] | Freq: Every day | SUBCUTANEOUS | Status: DC

## 2016-07-09 MED ORDER — INSULIN ASPART 100 UNIT/ML ~~LOC~~ SOLN
0.0000 [IU] | Freq: Three times a day (TID) | SUBCUTANEOUS | Status: DC
  Administered 2016-07-10 (×2): 2 [IU] via SUBCUTANEOUS
  Filled 2016-07-09 (×2): qty 2
  Filled 2016-07-09: qty 5

## 2016-07-09 MED ORDER — ALBUTEROL SULFATE (2.5 MG/3ML) 0.083% IN NEBU: 2.5000 mg | INHALATION_SOLUTION | Freq: Three times a day (TID) | RESPIRATORY_TRACT | Status: DC | PRN

## 2016-07-09 MED ORDER — SODIUM CHLORIDE 0.9 % IV SOLN
INTRAVENOUS | Status: DC
  Administered 2016-07-09 – 2016-07-10 (×2): via INTRAVENOUS

## 2016-07-09 MED ORDER — DEXAMETHASONE SODIUM PHOSPHATE 10 MG/ML IJ SOLN
10.0000 mg | Freq: Four times a day (QID) | INTRAMUSCULAR | Status: DC
  Administered 2016-07-10 (×3): 10 mg via INTRAVENOUS
  Filled 2016-07-09 (×5): qty 1

## 2016-07-09 MED ORDER — ATORVASTATIN CALCIUM 20 MG PO TABS
20.0000 mg | ORAL_TABLET | Freq: Every day | ORAL | Status: DC
  Administered 2016-07-09 – 2016-07-10 (×2): 20 mg via ORAL
  Filled 2016-07-09 (×2): qty 1

## 2016-07-09 MED ORDER — ONDANSETRON HCL 4 MG/2ML IJ SOLN
4.0000 mg | Freq: Four times a day (QID) | INTRAMUSCULAR | Status: DC | PRN
  Administered 2016-07-09: 23:00:00 4 mg via INTRAVENOUS
  Filled 2016-07-09: qty 2

## 2016-07-09 NOTE — Progress Notes (Signed)
Family Meeting Note  Advance Directive:yes  Today a meeting took place with the Patient.and family     The following clinical team members were present during this meeting:MD RN  The following were discussed:Patient's diagnosis: , Patient's progosis: < 12 months and Goals for treatment: DNR  Additional follow-up to be provided: Case management for Hospice Family does not want palliative care consult but would like to take patient home with hospice  Time spent during discussion:16 minutes  Alfreda Hammad, MD

## 2016-07-09 NOTE — Progress Notes (Signed)
Pt received 1L NS. No complaints of pain. Resting in chair. Pt going to room 106. Report called to floor by Argentina Donovan, RN. Orderly to transport pt to floor.

## 2016-07-09 NOTE — H&P (Addendum)
[facility] [place] NAME: Kristi Kramer    MR#:  [id number]  DATE OF BIRTH:  [birth year]  DATE OF ADMISSION:  07/09/2016  PRIMARY CARE PHYSICIAN: Cyndi Bender, PA-C   REQUESTING/REFERRING PHYSICIAN: Dr Grayland Ormond  CHIEF COMPLAINT:   weakness HISTORY OF PRESENT ILLNESS:  Kristi Kramer  is a 70 y.o. female with a known history CNS lymphoma, diabetes mellitus, hypertension, COPD, who was brought into the hospital by request of Dr Grayland Ormond for decompensation in her health over the past few days. She has had loss of appetitie and weakness with some falls over the past days. Family concerned about patients decline and would like to see Hospice nurse while patient is in the hospital.  PAST MEDICAL HISTORY:   Past Medical History:  Diagnosis Date  . Anxiety   . Arthritis   . Asthma   . Cancer ([place])    large B cell lymphoma  . Cataract   . COPD (chronic obstructive pulmonary disease) (HCC)    sarcoidosis  . Depression   . Diabetes mellitus   . GERD (gastroesophageal reflux disease)   . High cholesterol   . Hypertension   . Insomnia   . Migraine   . Shortness of breath dyspnea   . Sleep apnea    cpap  . Stroke ([place])    x2 12, 16    PAST SURGICAL HISTORY:   Past Surgical History:  Procedure Laterality Date  . ABDOMINAL HYSTERECTOMY    . APPENDECTOMY    . BACK SURGERY    . CHOLECYSTECTOMY    . COLONOSCOPY    . CRANIOTOMY N/A 05/26/2016   Procedure: CRANIOTOMY TUMOR EXCISION;  Surgeon: Newman Pies, MD;  Location: [facility];  Service: Neurosurgery;  Laterality: N/A;  CRANIOTOMY TUMOR EXCISION  . EAR EXAMINATION UNDER ANESTHESIA Left    plate  . NECK SURGERY    . RADIOLOGY WITH ANESTHESIA N/A 05/21/2016   Procedure: RADIOLOGY WITH ANESTHESIA - MRI;  Surgeon: Medication Radiologist, MD;  Location: [facility];  Service: Radiology;  Laterality: N/A;    SOCIAL HISTORY:   Social History  Substance Use Topics  . Smoking status: Light  Tobacco Smoker    Packs/day: 0.25    Years: 22.00    Types: Cigarettes  . Smokeless tobacco: Never Used     Comment: wants patches to quit  . Alcohol use No    FAMILY HISTORY:   Family History  Problem Relation Age of Onset  . Liver cancer Father   . Heart disease Sister   . Other Mother     blood clots  . Diabetes Mother   . Heart disease Mother     DRUG ALLERGIES:   Allergies  Allergen Reactions  . Mometasone Anaphylaxis and Rash    Pt states that this does not cause anaphylaxis  . Codeine Nausea And Vomiting  . Nasonex [Mometasone Furoate] Rash  . Sulfa Antibiotics Nausea And Vomiting    REVIEW OF SYSTEMS:   Review of Systems  Constitutional: Positive for malaise/fatigue and weight loss. Negative for chills and fever.  HENT: Negative.  Negative for ear discharge, ear pain, hearing loss, nosebleeds and sore throat.   Eyes: Negative.  Negative for blurred vision and pain.  Respiratory: Negative.  Negative for cough, hemoptysis, shortness of breath and wheezing.   Cardiovascular: Negative.  Negative for chest pain, palpitations and leg swelling.  Gastrointestinal: Negative.  Negative for abdominal pain, blood in stool, diarrhea, nausea and vomiting.  Genitourinary: Negative.  Negative for dysuria.  Musculoskeletal: Negative.  Negative for back pain.  Skin: Negative.   Neurological: Positive for dizziness and weakness. Negative for tremors, speech change, focal weakness, seizures and headaches.  Endo/Heme/Allergies: Negative.  Does not bruise/bleed easily.  Psychiatric/Behavioral: Negative.  Negative for depression, hallucinations and suicidal ideas.    MEDICATIONS AT HOME:   Prior to Admission medications   Medication Sig Start Date End Date Taking? Authorizing Provider  ADVAIR DISKUS 250-50 MCG/DOSE AEPB Inhale 1 puff into the lungs 2 (two) times daily. 11/27/15   Historical Provider, MD  albuterol (PROVENTIL) (2.5 MG/3ML) 0.083% nebulizer solution Take 2.5 mg by  nebulization 3 (three) times daily as needed for wheezing or shortness of breath.    Historical Provider, MD  aspirin EC 81 MG tablet Take 81 mg by mouth daily.     Historical Provider, MD  atorvastatin (LIPITOR) 20 MG tablet Take 20 mg by mouth daily.  06/02/15   Historical Provider, MD  citalopram (CELEXA) 20 MG tablet Take 20 mg by mouth daily.    Historical Provider, MD  dexamethasone (DECADRON) 4 MG tablet Take 1 tablet (4 mg total) by mouth 2 (two) times daily. 07/03/16   Lloyd Huger, MD  esomeprazole (NEXIUM) 20 MG capsule Take 20 mg by mouth daily at 12 noon.     Historical Provider, MD  fluticasone Asencion Islam) 50 MCG/ACT nasal spray  04/16/16   Historical Provider, MD  HYDROcodone-acetaminophen (NORCO/VICODIN) 5-325 MG tablet Take 1-2 tablets by mouth every 4 (four) hours as needed for moderate pain. 05/28/16   Newman Pies, MD  levETIRAcetam (KEPPRA) 500 MG tablet Take 1 tablet (500 mg total) by mouth 2 (two) times daily. 05/28/16   Newman Pies, MD  metFORMIN (GLUCOPHAGE) 1000 MG tablet Take 1,000 mg by mouth 2 (two) times daily with a meal.  06/02/15   Historical Provider, MD  VENTOLIN HFA 108 (90 BASE) MCG/ACT inhaler Inhale 2 puffs into the lungs every 6 (six) hours as needed. wheezing 04/10/15   Historical Provider, MD  Vitamin D, Ergocalciferol, (DRISDOL) 50000 units CAPS capsule Take 50,000 Units by mouth every 7 (seven) days.    Historical Provider, MD      VITAL SIGNS:  Blood pressure (!) 151/76, pulse 72, temperature 98.5 F (36.9 C), temperature source Axillary, resp. rate 16, height '5\' 5"'$  (1.651 m), weight 71.5 kg (157 lb 9.6 oz), SpO2 100 %.  PHYSICAL EXAMINATION:   Physical Exam  Constitutional: She is oriented to person, place, and time and well-developed, well-nourished, and in no distress. No distress.  HENT:  Head: Normocephalic.  Eyes: No scleral icterus.  Neck: Normal range of motion. Neck supple. No JVD present. No tracheal deviation present.   Cardiovascular: Normal rate, regular rhythm and normal heart sounds.  Exam reveals no gallop and no friction rub.   No murmur heard. Pulmonary/Chest: Effort normal and breath sounds normal. No respiratory distress. She has no wheezes. She has no rales. She exhibits no tenderness.  Abdominal: Soft. Bowel sounds are normal. She exhibits no distension and no mass. There is no tenderness. There is no rebound and no guarding.  Musculoskeletal: Normal range of motion. She exhibits no edema, tenderness or deformity.  Neurological: She is alert and oriented to person, place, and time.  Skin: Skin is warm. No rash noted. No erythema.  Psychiatric: Affect normal.      LABORATORY PANEL:   CBC  Recent Labs Lab 07/09/16 1100  WBC 9.8  HGB 14.1  HCT  40.0  PLT 270   ------------------------------------------------------------------------------------------------------------------  Chemistries   Recent Labs Lab 07/09/16 1100  NA 130*  K 3.9  CL 95*  CO2 23  GLUCOSE 137*  BUN 30*  CREATININE 0.84  CALCIUM 10.0  MG 1.8  AST 38  ALT 22  ALKPHOS 63  BILITOT 0.8   ------------------------------------------------------------------------------------------------------------------  Cardiac Enzymes No results for input(s): TROPONINI in the last 168 hours. ------------------------------------------------------------------------------------------------------------------  RADIOLOGY:  No results found.  EKG:    IMPRESSION AND PLAN:   70 year old female with CNS lymphoma here with failure to thrive and weakness.  1. Generalized weakness with progressive decline: Evaluate for UTI and PNA with UA and CXR. Start IVF CM consult and hospice evaluation.   2. CNS lymphoma/Hx of seziures: Start Decadron 10 mg IV q 6 h as per Dr Grayland Ormond. Continue Keppra 3. Diabetes: SSI and ADA diet 4.OSA: CPAP at night 5. COPD without exacerbation    All the records are reviewed and case  discussed with ED provider. Management plans discussed with the patient and family and they are in agreement  CODE STATUS: DNR  TOTAL TIME TAKING CARE OF THIS PATIENT: 45 minutes.    Elijahjames Fuelling M.D on 07/09/2016 at 3:57 PM  Between 7am to 6pm - Pager - [phone]  After 6pm go to [email or web address] - password [email or web address] [facility]  Office  [phone]  CC: Primary care physician; Cyndi Bender, PA-C

## 2016-07-09 NOTE — Progress Notes (Signed)
Chaplain received and order to visit with pt in room 106. Chaplain provided education and completed an Scientist, physiological.    07/09/16 1651  Clinical Encounter Type  Visited With Patient;Patient and family together  Visit Type Initial  Referral From Nurse  Consult/Referral To Chaplain  Spiritual Encounters  Spiritual Needs Other (Comment)

## 2016-07-10 ENCOUNTER — Telehealth: Payer: Self-pay | Admitting: *Deleted

## 2016-07-10 ENCOUNTER — Encounter: Payer: Self-pay | Admitting: Oncology

## 2016-07-10 DIAGNOSIS — F419 Anxiety disorder, unspecified: Secondary | ICD-10-CM | POA: Diagnosis not present

## 2016-07-10 DIAGNOSIS — E43 Unspecified severe protein-calorie malnutrition: Secondary | ICD-10-CM | POA: Diagnosis not present

## 2016-07-10 DIAGNOSIS — F17210 Nicotine dependence, cigarettes, uncomplicated: Secondary | ICD-10-CM | POA: Diagnosis not present

## 2016-07-10 DIAGNOSIS — E7800 Pure hypercholesterolemia, unspecified: Secondary | ICD-10-CM | POA: Diagnosis not present

## 2016-07-10 DIAGNOSIS — G4733 Obstructive sleep apnea (adult) (pediatric): Secondary | ICD-10-CM | POA: Diagnosis not present

## 2016-07-10 DIAGNOSIS — E860 Dehydration: Secondary | ICD-10-CM | POA: Diagnosis not present

## 2016-07-10 DIAGNOSIS — C8589 Other specified types of non-Hodgkin lymphoma, extranodal and solid organ sites: Secondary | ICD-10-CM | POA: Diagnosis not present

## 2016-07-10 DIAGNOSIS — I10 Essential (primary) hypertension: Secondary | ICD-10-CM | POA: Diagnosis not present

## 2016-07-10 DIAGNOSIS — Z8669 Personal history of other diseases of the nervous system and sense organs: Secondary | ICD-10-CM | POA: Diagnosis not present

## 2016-07-10 DIAGNOSIS — R531 Weakness: Secondary | ICD-10-CM | POA: Diagnosis not present

## 2016-07-10 DIAGNOSIS — Z7984 Long term (current) use of oral hypoglycemic drugs: Secondary | ICD-10-CM | POA: Diagnosis not present

## 2016-07-10 DIAGNOSIS — R627 Adult failure to thrive: Secondary | ICD-10-CM | POA: Diagnosis not present

## 2016-07-10 DIAGNOSIS — E119 Type 2 diabetes mellitus without complications: Secondary | ICD-10-CM | POA: Diagnosis not present

## 2016-07-10 DIAGNOSIS — N390 Urinary tract infection, site not specified: Secondary | ICD-10-CM | POA: Diagnosis not present

## 2016-07-10 DIAGNOSIS — Z8673 Personal history of transient ischemic attack (TIA), and cerebral infarction without residual deficits: Secondary | ICD-10-CM | POA: Diagnosis not present

## 2016-07-10 DIAGNOSIS — C8339 Diffuse large B-cell lymphoma, extranodal and solid organ sites: Secondary | ICD-10-CM | POA: Diagnosis not present

## 2016-07-10 DIAGNOSIS — K219 Gastro-esophageal reflux disease without esophagitis: Secondary | ICD-10-CM | POA: Diagnosis not present

## 2016-07-10 DIAGNOSIS — F329 Major depressive disorder, single episode, unspecified: Secondary | ICD-10-CM | POA: Diagnosis not present

## 2016-07-10 DIAGNOSIS — J449 Chronic obstructive pulmonary disease, unspecified: Secondary | ICD-10-CM | POA: Diagnosis not present

## 2016-07-10 DIAGNOSIS — M1990 Unspecified osteoarthritis, unspecified site: Secondary | ICD-10-CM | POA: Diagnosis not present

## 2016-07-10 LAB — BASIC METABOLIC PANEL
Anion gap: 8 (ref 5–15)
BUN: 19 mg/dL (ref 6–20)
CHLORIDE: 97 mmol/L — AB (ref 101–111)
CO2: 26 mmol/L (ref 22–32)
CREATININE: 0.6 mg/dL (ref 0.44–1.00)
Calcium: 9.3 mg/dL (ref 8.9–10.3)
GFR calc Af Amer: >60 mL/min (ref >60)
GFR calc non Af Amer: >60 mL/min (ref >60)
GLUCOSE: 152 mg/dL — AB (ref 65–99)
Potassium: 3.6 mmol/L (ref 3.5–5.1)
SODIUM: 131 mmol/L — AB (ref 135–145)

## 2016-07-10 LAB — GLUCOSE, CAPILLARY
GLUCOSE-CAPILLARY: 177 mg/dL — AB (ref 65–99)
GLUCOSE-CAPILLARY: 185 mg/dL — AB (ref 65–99)

## 2016-07-10 LAB — CBC
HCT: 37 % (ref 35.0–47.0)
Hemoglobin: 13.2 g/dL (ref 12.0–16.0)
MCH: 30.5 pg (ref 26.0–34.0)
MCHC: 35.7 g/dL (ref 32.0–36.0)
MCV: 85.3 fL (ref 80.0–100.0)
PLATELETS: 217 10*3/uL (ref 150–440)
RBC: 4.34 MIL/uL (ref 3.80–5.20)
RDW: 15 % — AB (ref 11.5–14.5)
WBC: 8.6 10*3/uL (ref 3.6–11.0)

## 2016-07-10 MED ORDER — METFORMIN HCL 500 MG PO TABS: 1000.0000 mg | ORAL_TABLET | Freq: Two times a day (BID) | ORAL | Status: DC

## 2016-07-10 MED ORDER — ENSURE ENLIVE PO LIQD: 237.0000 mL | ORAL | Status: DC | PRN

## 2016-07-10 NOTE — Discharge Summary (Signed)
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 Anxiety   . Arthritis   . Asthma   . Cancer ([place])    large B cell lymphoma  . Cataract   . COPD (chronic obstructive pulmonary disease) (HCC)    sarcoidosis  . Depression   . Diabetes mellitus   . GERD (gastroesophageal reflux disease)   . High cholesterol   . Hypertension   . Insomnia   . Migraine   . Shortness of breath dyspnea   . Sleep apnea    cpap  . Stroke [facility])    x2 12, [place] COURSE:  Kristi Kramer  is a 70 y.o. female admitted 07/09/2016 with chief complaint weakness. Please see H&P performed by Bettey Costa, MD for further information. Patient presented with generalized weakness and overall decompensation. Given her diagnosis of Diffuse large B-cell lymphoma of brain and wish to not undergo aggressive care she is going to be transitioned to home with hospice care  DISCHARGE CONDITIONS:   stable  CONSULTS OBTAINED:    DRUG ALLERGIES:   Allergies  Allergen Reactions  . Mometasone Anaphylaxis and Rash    Pt states that this does not cause anaphylaxis  . Codeine Nausea And Vomiting  . Nasonex [Mometasone Furoate] Rash  . Sulfa Antibiotics Nausea And Vomiting    DISCHARGE MEDICATIONS:   Current Discharge Medication List    CONTINUE these medications which have NOT CHANGED   Details  dexamethasone (DECADRON) 4 MG tablet Take 1 tablet (4 mg total) by mouth 2 (two) times daily. Qty: 60 tablet, Refills: 1   Associated Diagnoses: Diffuse  large B-cell lymphoma of lymph nodes of head (HCC)    esomeprazole (NEXIUM) 20 MG capsule Take 20 mg by mouth daily at 12 noon.     HYDROcodone-acetaminophen (NORCO/VICODIN) 5-325 MG tablet Take 1-2 tablets by mouth every 4 (four) hours as needed for moderate pain. Qty: 50 tablet, Refills: 0    levETIRAcetam (KEPPRA) 500 MG tablet Take 1 tablet (500 mg total) by mouth 2 (two) times daily. Qty: 60 tablet, Refills: 1      STOP taking these medications     ADVAIR DISKUS 250-50 MCG/DOSE AEPB      albuterol (PROVENTIL) (2.5 MG/3ML) 0.083% nebulizer solution      aspirin EC 81 MG tablet      atorvastatin (LIPITOR) 20 MG tablet      citalopram (CELEXA) 20 MG tablet      fluticasone (FLONASE) 50 MCG/ACT nasal spray      metFORMIN (GLUCOPHAGE) 1000 MG tablet      VENTOLIN HFA 108 (90 BASE) MCG/ACT inhaler      Vitamin D, Ergocalciferol, (DRISDOL) 50000 units CAPS capsule          DISCHARGE INSTRUCTIONS:    DIET:  Regular diet  DISCHARGE CONDITION:  hospice  ACTIVITY:  Activity as tolerated  OXYGEN:  Home Oxygen: No.   Oxygen Delivery: room air  DISCHARGE LOCATION:  home   If you experience worsening of your  admission symptoms, develop shortness of breath, life threatening emergency, suicidal or homicidal thoughts you must seek medical attention immediately by calling 911 or calling your MD immediately  if symptoms less severe.  You Must read complete instructions/literature along with all the possible adverse reactions/side effects for all the Medicines you take and that have been prescribed to you. Take any new Medicines after you have completely understood and accpet all the possible adverse reactions/side effects.   Please note  You were cared for by a hospitalist during your hospital stay. If you have any questions about your discharge medications or the care you received while you were in the hospital after you are discharged, you can call the unit and asked  to speak with the hospitalist on call if the hospitalist that took care of you is not available. Once you are discharged, your primary care physician will handle any further medical issues. Please note that NO REFILLS for any discharge medications will be authorized once you are discharged, as it is imperative that you return to your primary care physician (or establish a relationship with a primary care physician if you do not have one) for your aftercare needs so that they can reassess your need for medications and monitor your lab values.    On the day of Discharge:   VITAL SIGNS:  Blood pressure (!) 144/78, pulse 74, temperature 97.8 F (36.6 C), temperature source Oral, resp. rate 18, height '5\' 5"'$  (1.651 m), weight 71.5 kg (157 lb 9.6 oz), SpO2 98 %.  I/O:   Intake/Output Summary (Last 24 hours) at 07/10/16 1412 Last data filed at 07/10/16 0023  Gross per 24 hour  Intake              293 ml  Output              100 ml  Net              193 ml    PHYSICAL EXAMINATION:  GENERAL:  70 y.o.-year-old patient lying in the bed with no acute distress.  EYES: Pupils equal, round, reactive to light and accommodation. No scleral icterus. Extraocular muscles intact.  HEENT: Head atraumatic, normocephalic. Oropharynx and nasopharynx clear.  NECK:  Supple, no jugular venous distention. No thyroid enlargement, no tenderness.  LUNGS: Normal breath sounds bilaterally, no wheezing, rales,rhonchi or crepitation. No use of accessory muscles of respiration.  CARDIOVASCULAR: S1, S2 normal. No murmurs, rubs, or gallops.  ABDOMEN: Soft, non-tender, non-distended. Bowel sounds present. No organomegaly or mass.  EXTREMITIES: No pedal edema, cyanosis, or clubbing.  NEUROLOGIC: Cranial nerves II through XII are intact. Muscle strength 4/4 in all extremities. Sensation intact. Gait not checked.  PSYCHIATRIC: The patient is alert and oriented x 3.  SKIN: No obvious rash, lesion, or ulcer.   DATA REVIEW:    CBC  Recent Labs Lab 07/10/16 0557  WBC 8.6  HGB 13.2  HCT 37.0  PLT 217    Chemistries   Recent Labs Lab 07/09/16 1100 07/10/16 0557  NA 130* 131*  K 3.9 3.6  CL 95* 97*  CO2 23 26  GLUCOSE 137* 152*  BUN 30* 19  CREATININE 0.84 0.60  CALCIUM 10.0 9.3  MG 1.8  --   AST 38  --   ALT 22  --   ALKPHOS 63  --   BILITOT 0.8  --     Cardiac Enzymes No results for input(s): TROPONINI in the last 168 hours.  Microbiology Results  Results  for orders placed or performed during the hospital encounter of 06/29/16  Urine culture     Status: None   Collection Time: 06/30/16  2:39 AM  Result Value Ref Range Status   Specimen Description URINE, CLEAN CATCH  Final   Special Requests NONE  Final   Culture NO GROWTH  Final   Report Status 07/01/2016 FINAL  Final    RADIOLOGY:  Dg Chest 1 View  Result Date: 07/09/2016 CLINICAL DATA:  CNS lymphoma with worsening Health over the last several days. EXAM: CHEST 1 VIEW COMPARISON:  06/29/2016 FINDINGS: 1648 hours. The cardio pericardial silhouette is enlarged. The lungs are clear wiithout focal pneumonia, edema, pneumothorax or pleural effusion. Atelectasis or scarring again noted left base. The visualized bony structures of the thorax are intact. Status post extensive cervical fusion. IMPRESSION: Stable.  No acute cardiopulmonary findings. Electronically Signed   By: Misty Stanley M.D.   On: 07/09/2016 18:04     Management plans discussed with the patient, family and they are in agreement.  CODE STATUS:     Code Status Orders        Start     Ordered   07/09/16 1849  Do not attempt resuscitation (DNR)  Continuous    Question Answer Comment  In the event of cardiac or respiratory ARREST Do not call a "code blue"   In the event of cardiac or respiratory ARREST Do not perform Intubation, CPR, defibrillation or ACLS   In the event of cardiac or respiratory ARREST Use medication by any route, position, wound care, and  other measures to relive pain and suffering. May use oxygen, suction and manual treatment of airway obstruction as needed for comfort.      07/09/16 1848    Code Status History    Date Active Date Inactive Code Status Order ID Comments User Context   07/09/2016  6:48 PM 07/09/2016  6:48 PM DNR [id number]  Bettey Costa, MD Inpatient   06/29/2016  9:11 PM 07/01/2016  1:47 PM Full Code [id number]  Rise Patience, MD ED   05/19/2016  5:26 AM 05/28/2016  3:25 PM Full Code [id number]  Rise Patience, MD ED   12/26/2015  6:57 PM 12/28/2015  1:36 PM Full Code [id number]  Lavina Hamman, MD Inpatient   09/26/2015  5:23 AM 09/28/2015  5:45 PM Full Code [id number]  Reubin Milan, MD ED   07/23/2011 10:40 PM 07/25/2011  7:28 PM Full Code [id number]  Senaida Ores, RN ED      TOTAL TIME TAKING CARE OF THIS PATIENT: 33 minutes.    Dandy Lazaro,  Karenann Cai.D on 07/10/2016 at 2:12 PM  Between 7am to 6pm - Pager - [phone]  After 6pm go to [email or web address] - Proofreader  [facility]  Office  [phone]  CC: Primary care physician; Cyndi Bender, PA-C

## 2016-07-10 NOTE — Plan of Care (Signed)
Problem: Bowel/Gastric: Goal: Will not experience complications related to bowel motility Outcome: Not Progressing Pt with nausea with PM meds.

## 2016-07-10 NOTE — Progress Notes (Signed)
New referral for [facility] at home following dischage received from [facility]. [name] is a 70 year old woman with a known history of Difuse B cell Lymphoma admitted to [facility] on 12/20 for treatment of dehydration. Pe family report patient has had an increasingly difficult time maintaining her balance since earlier in the weak, she has had some falls at home and is eating very little. They also report increased and memory issues. She was also recently treated for a UTI. Family is not pursuing any further treatment for the lymphoma and wish to focus on comfort with the support of hospice at home. Writer met in the family room with patient's son Kristi Kramer [facility]) and her significant other Kristi Kramer to initiate education regarding hospice services, philosophy and team approach to care with good understanding voiced. No DME is requested at this time. Patient has a BSC, front wheeled walker, rollator walker and standard wheel chair in her home. Plan is for discharge home today via car with sign portable DNR in place. Hospital care team all aware. Thank you for the opportunity to be involved in the care of this patient and her family. Flo Shanks RN, BNS, [facility], hospital Liaison [phone] c

## 2016-07-10 NOTE — Telephone Encounter (Signed)
Asking for referral for services. Per Dr Grayland Ormond, [place] to call referral. Message left  On triage VM

## 2016-07-10 NOTE — Progress Notes (Signed)
Initial Nutrition Assessment  DOCUMENTATION CODES:   Severe malnutrition in context of chronic illness  INTERVENTION:  As patient to discharge home with hospice, recommend considering liberalizing diet from CHO Modified to Regular. With patient's poor appetite and intake likely having minimal effect on CBGs.  Provide Ensure Enlive po PRN as desired by patient. Each supplement provides 350 kcal and 20 grams of protein.  NUTRITION DIAGNOSIS:   Malnutrition (Severe) related to chronic illness as evidenced by percent weight loss, energy intake < or equal to 75% for > or equal to 1 month.  GOAL:   Patient will meet greater than or equal to 90% of their needs  MONITOR:   PO intake, Labs, I & O's, Weight trends  REASON FOR ASSESSMENT:   Malnutrition Screening Tool    ASSESSMENT:   70 y.o. female with a known history CNS lymphoma, diabetes mellitus, hypertension, COPD, who was brought into the hospital by request of Dr Grayland Ormond for decompensation in her health over the past few days. She has had loss of appetitie and weakness with some falls over the past days. Family concerned about patients decline and would like to see Hospice nurse while patient is in the hospital.   -Patient diagnosed with CNS lymphoma in November 2017. Per Oncology note on 12/14 patient and family not pursuing aggressive chemotherapy at this time. Reinitiated dexamethasone 4 mg BID.  Discussed with RN. Patient likely to discharge today. Per chart family wants patient to d/c home with hospice.   Patient with poor appetite and intake (finishing 50% of meals at best). Per chart review this has at least been going on since at least 12/2015. Denies N/V, abdominal pain. Patient was 202-207 lbs earlier in 2017. She has lost 45 lbs (22% body weight) over 9 months, which is significant for time frame.   Medications reviewed and include: dexamethasone 10 mg Q6hrs, Novolog sliding scale TID with meals and daily HS (received  2 units past 24 hrs), pantoprazole, NS @ 75 ml/hr.  Labs reviewed: CBG 105-177 past 24 hrs, Sodium 131, Chloride 97.  Did not complete nutrition-focused physical exam out of respect for patient's comfort.  Patient meets criteria for severe chronic malnutrition in setting of 22% weight loss over 9 months, intake </= 75% of estimated energy requirement for >/= 1 month.  Diet Order:  Diet Carb Modified Fluid consistency: Thin; Room service appropriate? Yes  Skin:  Reviewed, no issues  Last BM:  07/09/2016  Height:   Ht Readings from Last 1 Encounters:  07/09/16 '5\' 5"'$  (1.651 m)    Weight:   Wt Readings from Last 1 Encounters:  07/09/16 157 lb 9.6 oz (71.5 kg)    Ideal Body Weight:  56.8 kg  BMI:  Body mass index is 26.23 kg/m.  Estimated Nutritional Needs:   Kcal:  1800-2100 (25-30 kcal/kg)  Protein:  86-107 grams (1.2-1.5 grams/kg)  Fluid:  >/= 1.8 L/day  EDUCATION NEEDS:   Education needs no appropriate at this time  Willey Blade, MS, RD, LDN Pager: [phone] After Hours Pager: [phone]

## 2016-07-10 NOTE — Care Management (Signed)
Admitted to [facility] under observation status with the diagnosis of failure to thrive. Lives with friend, Kristi Kramer x 19 years [id number]). Last seen Dr. Tobie Lords x 1 month ago. No home health. No skilled facility. No home oxygen. Fell last Friday. Good appetite, but has lost weight. Cane and rolling walker in the home. Prescriptions are filled at [facility] in [place]. Takes care of all basic activities of daily living herself, can drive, but doesn't.  Friends states that [name] has been declining for the last 2 weeks. Would like to go home with Hospice services in the home. Discussed agencies. [place]. Will see if [facility] goes to zip code [place] Management

## 2016-07-10 NOTE — Care Management Obs Status (Signed)
[facility] NOTIFICATION   Patient Details  Name: Kristi Kramer MRN: [id number] Date of Birth: [birth year]   Medicare Observation Status Notification Given:  Yes    Shelbie Ammons, RN 07/10/2016, 9:25 AM

## 2016-07-10 NOTE — Discharge Planning (Signed)
Patient IV removed.  Discharge papers given, explained and educated.  Informed of suggested FU appts and appts made.  Home Health hospice will be contacting patient/family for first visit.  No scripts needed at this time. DNR documentation given to family for DC to home.  Once ready, patient will be wheeled to front and family transporting home via car.  RN assessment and VS revealed stability for DC to home at this time.

## 2016-07-11 ENCOUNTER — Telehealth: Payer: Self-pay | Admitting: *Deleted

## 2016-07-11 DIAGNOSIS — F419 Anxiety disorder, unspecified: Secondary | ICD-10-CM | POA: Diagnosis not present

## 2016-07-11 DIAGNOSIS — E43 Unspecified severe protein-calorie malnutrition: Secondary | ICD-10-CM | POA: Diagnosis not present

## 2016-07-11 DIAGNOSIS — F329 Major depressive disorder, single episode, unspecified: Secondary | ICD-10-CM | POA: Diagnosis not present

## 2016-07-11 DIAGNOSIS — C8339 Diffuse large B-cell lymphoma, extranodal and solid organ sites: Secondary | ICD-10-CM | POA: Diagnosis not present

## 2016-07-11 DIAGNOSIS — E119 Type 2 diabetes mellitus without complications: Secondary | ICD-10-CM | POA: Diagnosis not present

## 2016-07-11 DIAGNOSIS — J449 Chronic obstructive pulmonary disease, unspecified: Secondary | ICD-10-CM | POA: Diagnosis not present

## 2016-07-11 MED ORDER — ONDANSETRON HCL 8 MG PO TABS: 8.0000 mg | ORAL_TABLET | Freq: Three times a day (TID) | ORAL | 1 refills | Status: AC | PRN

## 2016-07-11 MED ORDER — SENNOSIDES-DOCUSATE SODIUM 8.6-50 MG PO TABS: 1.0000 | ORAL_TABLET | Freq: Two times a day (BID) | ORAL | 1 refills | Status: AC

## 2016-07-11 MED ORDER — LORAZEPAM 1 MG PO TABS: 1.0000 mg | ORAL_TABLET | Freq: Three times a day (TID) | ORAL | 0 refills | Status: AC

## 2016-07-11 MED ORDER — HYDROCODONE-ACETAMINOPHEN 5-325 MG PO TABS: 1.0000 | ORAL_TABLET | ORAL | 0 refills | Status: AC | PRN

## 2016-07-11 NOTE — Telephone Encounter (Signed)
Hospice called requestion medication management. Orders completed sent to pharmacy.

## 2016-07-13 DIAGNOSIS — E119 Type 2 diabetes mellitus without complications: Secondary | ICD-10-CM | POA: Diagnosis not present

## 2016-07-13 DIAGNOSIS — J449 Chronic obstructive pulmonary disease, unspecified: Secondary | ICD-10-CM | POA: Diagnosis not present

## 2016-07-13 DIAGNOSIS — C8339 Diffuse large B-cell lymphoma, extranodal and solid organ sites: Secondary | ICD-10-CM | POA: Diagnosis not present

## 2016-07-13 DIAGNOSIS — F419 Anxiety disorder, unspecified: Secondary | ICD-10-CM | POA: Diagnosis not present

## 2016-07-13 DIAGNOSIS — F329 Major depressive disorder, single episode, unspecified: Secondary | ICD-10-CM | POA: Diagnosis not present

## 2016-07-13 DIAGNOSIS — E43 Unspecified severe protein-calorie malnutrition: Secondary | ICD-10-CM | POA: Diagnosis not present

## 2016-07-14 DIAGNOSIS — F419 Anxiety disorder, unspecified: Secondary | ICD-10-CM | POA: Diagnosis not present

## 2016-07-14 DIAGNOSIS — E43 Unspecified severe protein-calorie malnutrition: Secondary | ICD-10-CM | POA: Diagnosis not present

## 2016-07-14 DIAGNOSIS — E119 Type 2 diabetes mellitus without complications: Secondary | ICD-10-CM | POA: Diagnosis not present

## 2016-07-14 DIAGNOSIS — C8339 Diffuse large B-cell lymphoma, extranodal and solid organ sites: Secondary | ICD-10-CM | POA: Diagnosis not present

## 2016-07-14 DIAGNOSIS — F329 Major depressive disorder, single episode, unspecified: Secondary | ICD-10-CM | POA: Diagnosis not present

## 2016-07-14 DIAGNOSIS — J449 Chronic obstructive pulmonary disease, unspecified: Secondary | ICD-10-CM | POA: Diagnosis not present

## 2016-07-15 ENCOUNTER — Ambulatory Visit: Payer: Medicare Other | Admitting: Neurology

## 2016-07-15 DIAGNOSIS — E119 Type 2 diabetes mellitus without complications: Secondary | ICD-10-CM | POA: Diagnosis not present

## 2016-07-15 DIAGNOSIS — F419 Anxiety disorder, unspecified: Secondary | ICD-10-CM | POA: Diagnosis not present

## 2016-07-15 DIAGNOSIS — E43 Unspecified severe protein-calorie malnutrition: Secondary | ICD-10-CM | POA: Diagnosis not present

## 2016-07-15 DIAGNOSIS — F329 Major depressive disorder, single episode, unspecified: Secondary | ICD-10-CM | POA: Diagnosis not present

## 2016-07-15 DIAGNOSIS — J449 Chronic obstructive pulmonary disease, unspecified: Secondary | ICD-10-CM | POA: Diagnosis not present

## 2016-07-15 DIAGNOSIS — C8339 Diffuse large B-cell lymphoma, extranodal and solid organ sites: Secondary | ICD-10-CM | POA: Diagnosis not present

## 2016-07-16 DIAGNOSIS — E43 Unspecified severe protein-calorie malnutrition: Secondary | ICD-10-CM | POA: Diagnosis not present

## 2016-07-16 DIAGNOSIS — F329 Major depressive disorder, single episode, unspecified: Secondary | ICD-10-CM | POA: Diagnosis not present

## 2016-07-16 DIAGNOSIS — E119 Type 2 diabetes mellitus without complications: Secondary | ICD-10-CM | POA: Diagnosis not present

## 2016-07-16 DIAGNOSIS — C8339 Diffuse large B-cell lymphoma, extranodal and solid organ sites: Secondary | ICD-10-CM | POA: Diagnosis not present

## 2016-07-16 DIAGNOSIS — J449 Chronic obstructive pulmonary disease, unspecified: Secondary | ICD-10-CM | POA: Diagnosis not present

## 2016-07-16 DIAGNOSIS — F419 Anxiety disorder, unspecified: Secondary | ICD-10-CM | POA: Diagnosis not present

## 2016-07-17 ENCOUNTER — Ambulatory Visit: Payer: Medicare Other | Admitting: Oncology

## 2016-07-17 DIAGNOSIS — J449 Chronic obstructive pulmonary disease, unspecified: Secondary | ICD-10-CM | POA: Diagnosis not present

## 2016-07-17 DIAGNOSIS — F419 Anxiety disorder, unspecified: Secondary | ICD-10-CM | POA: Diagnosis not present

## 2016-07-17 DIAGNOSIS — E119 Type 2 diabetes mellitus without complications: Secondary | ICD-10-CM | POA: Diagnosis not present

## 2016-07-17 DIAGNOSIS — E43 Unspecified severe protein-calorie malnutrition: Secondary | ICD-10-CM | POA: Diagnosis not present

## 2016-07-17 DIAGNOSIS — F329 Major depressive disorder, single episode, unspecified: Secondary | ICD-10-CM | POA: Diagnosis not present

## 2016-07-17 DIAGNOSIS — C8339 Diffuse large B-cell lymphoma, extranodal and solid organ sites: Secondary | ICD-10-CM | POA: Diagnosis not present

## 2016-07-18 DIAGNOSIS — E119 Type 2 diabetes mellitus without complications: Secondary | ICD-10-CM | POA: Diagnosis not present

## 2016-07-18 DIAGNOSIS — E43 Unspecified severe protein-calorie malnutrition: Secondary | ICD-10-CM | POA: Diagnosis not present

## 2016-07-18 DIAGNOSIS — F329 Major depressive disorder, single episode, unspecified: Secondary | ICD-10-CM | POA: Diagnosis not present

## 2016-07-18 DIAGNOSIS — J449 Chronic obstructive pulmonary disease, unspecified: Secondary | ICD-10-CM | POA: Diagnosis not present

## 2016-07-18 DIAGNOSIS — C8339 Diffuse large B-cell lymphoma, extranodal and solid organ sites: Secondary | ICD-10-CM | POA: Diagnosis not present

## 2016-07-18 DIAGNOSIS — F419 Anxiety disorder, unspecified: Secondary | ICD-10-CM | POA: Diagnosis not present

## 2016-07-18 IMAGING — US US CAROTID DUPLEX BILAT
1 series · 13 of 24 positions shown · non-contrast
Comparison: 09/27/2014

CLINICAL DATA: Hypertension, stroke, syncope, diabetes, previous
tobacco abuse

EXAM:
BILATERAL CAROTID DUPLEX ULTRASOUND
TECHNIQUE: Gray scale imaging, color Doppler and duplex ultrasound was
performed of bilateral carotid and vertebral arteries in the neck.

[Series 1: us carotid duplex bilat · 0.07mm/px · 13 of 70 slices shown]
[im 1/70]
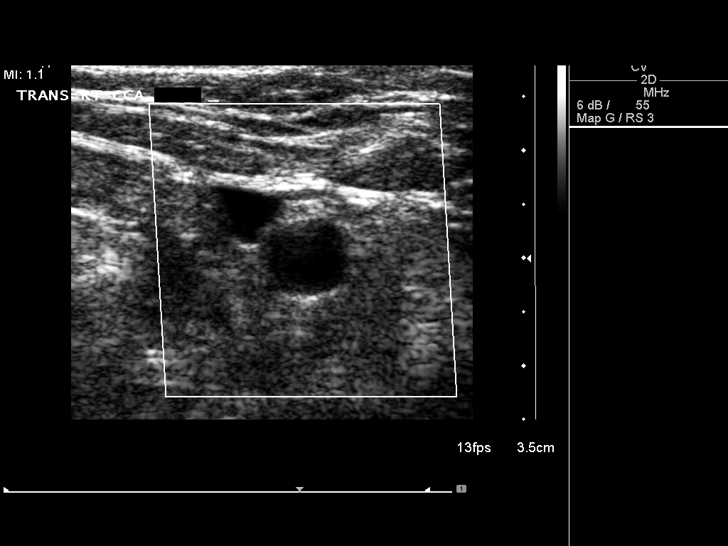
[im 7/70]
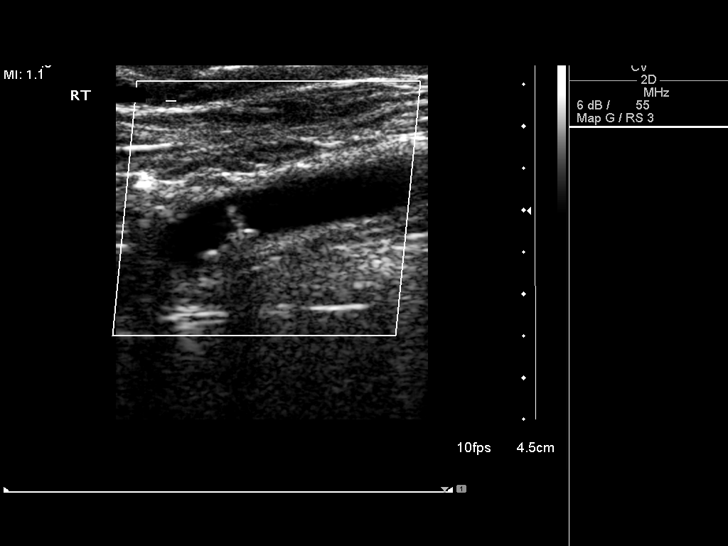
[im 13/70]
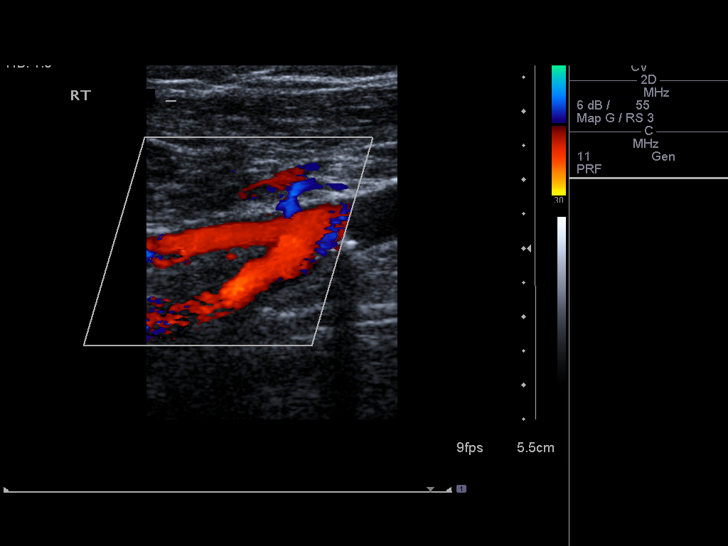
[im 19/70]
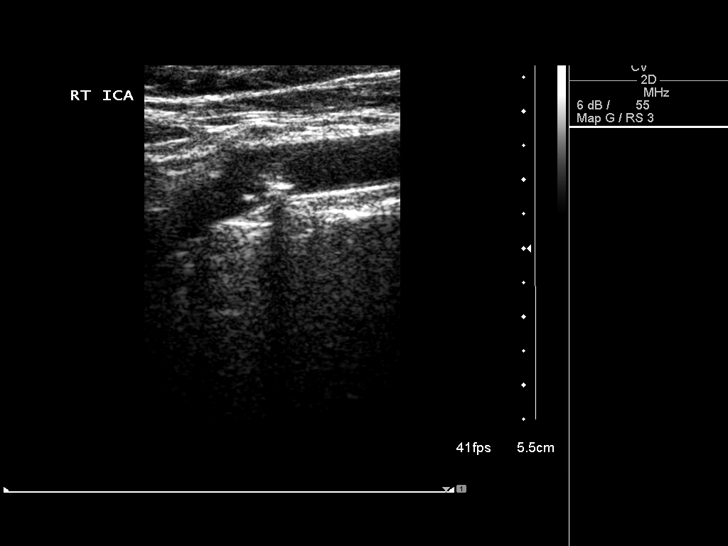
[im 25/70]
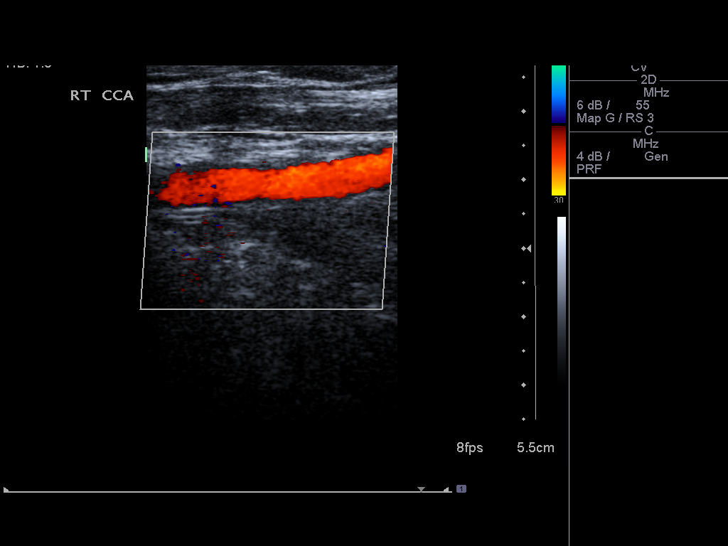
[im 31/70]
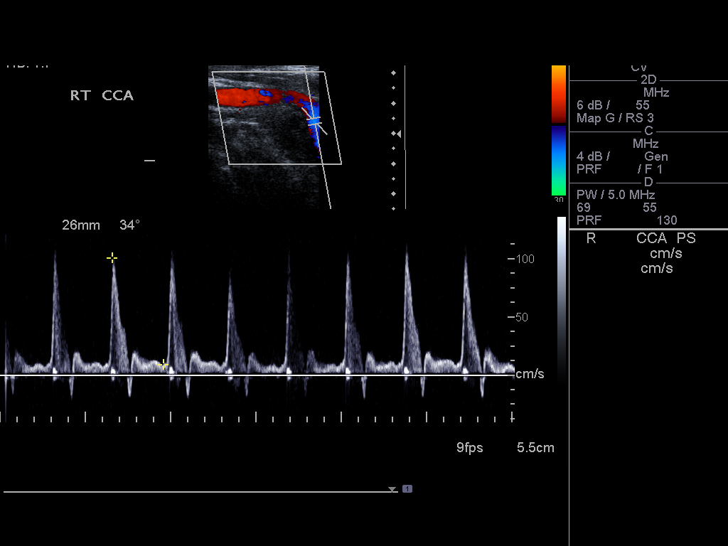
[im 37/70]
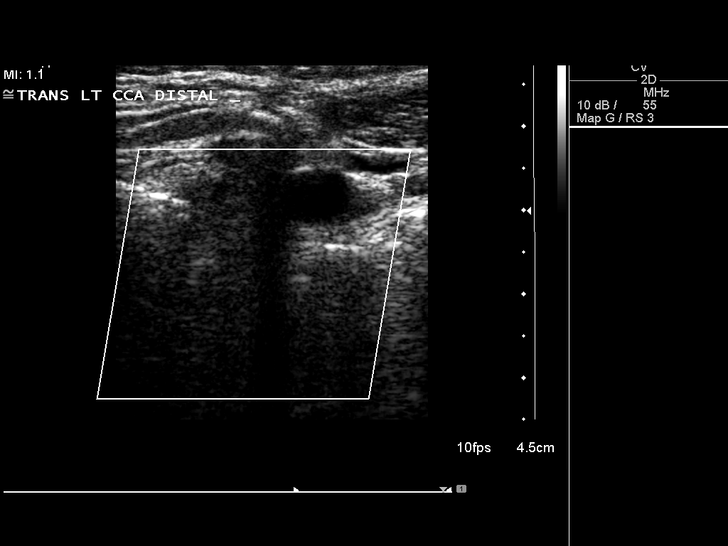
[im 40/70]
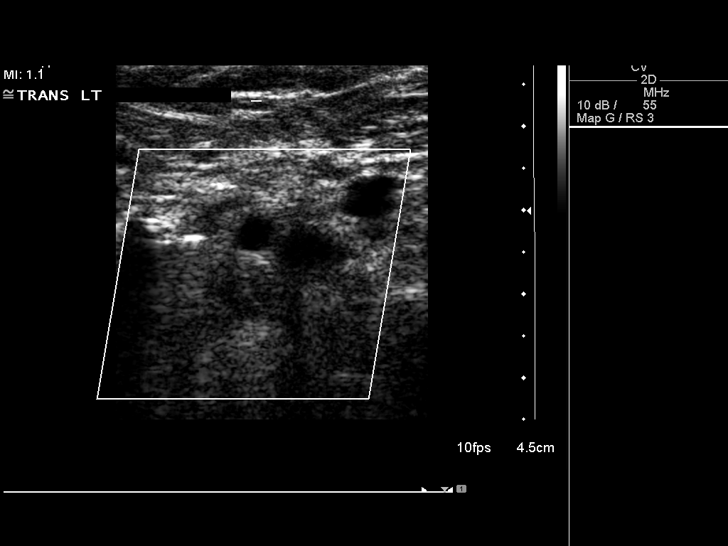
[im 46/70]
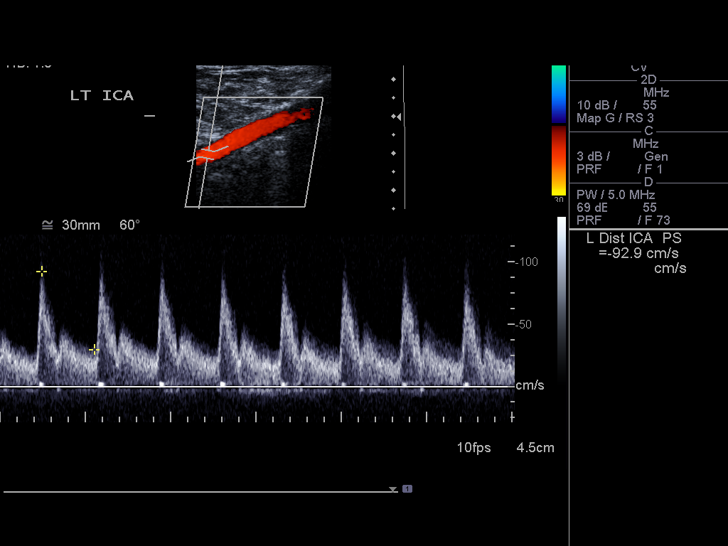
[im 52/70]
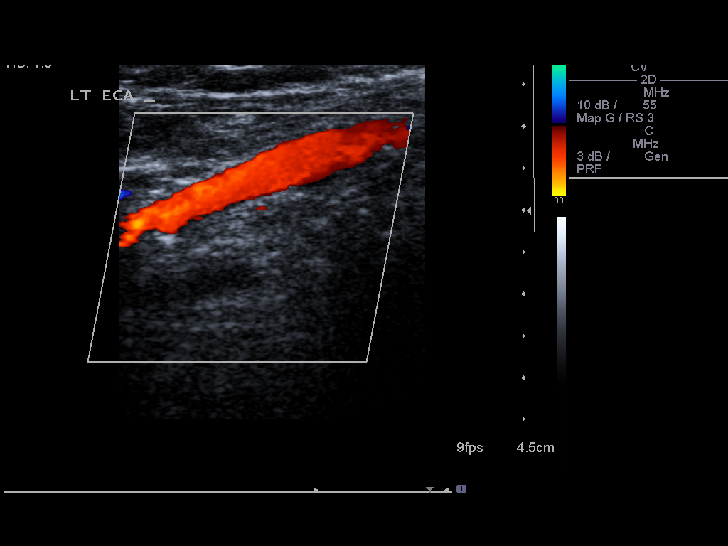
[im 58/70]
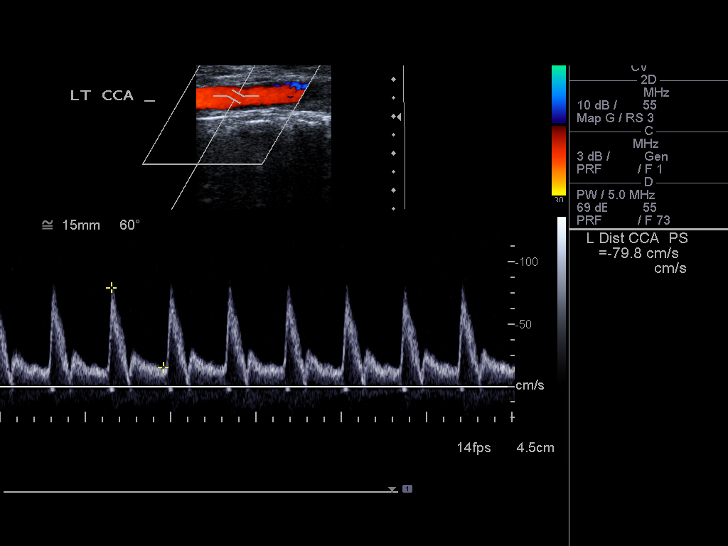
[im 64/70]
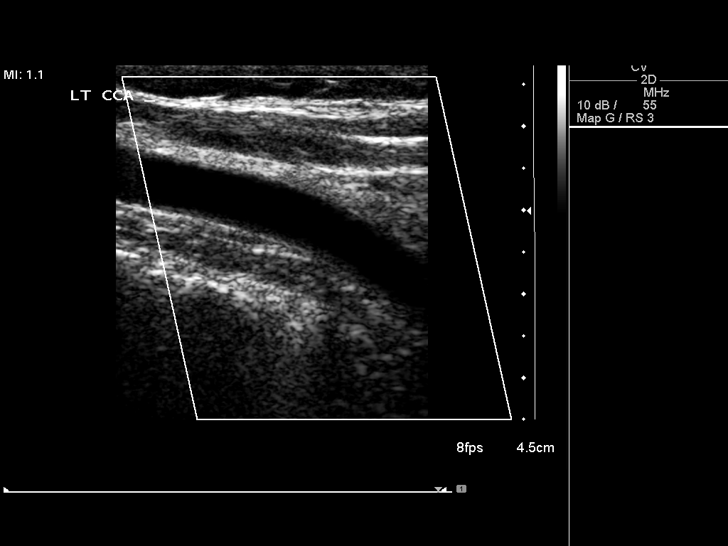
[im 70/70]
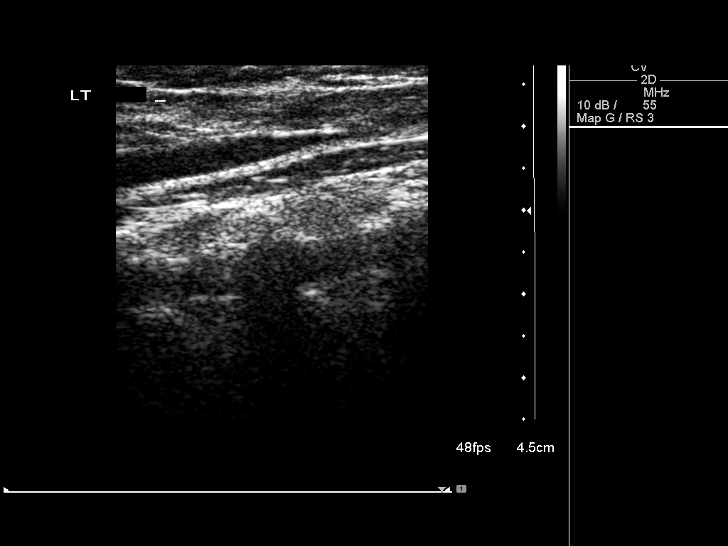

[13 of 24 positions shown; findings below may reference images not displayed]

REVIEW OF SYSTEMS:
Quantification of carotid stenosis is based on velocity parameters
that correlate the residual internal carotid diameter with
NASCET-based stenosis levels, using the diameter of the distal
internal carotid lumen as the denominator for stenosis measurement.

The following velocity measurements were obtained:

PEAK SYSTOLIC/END DIASTOLIC

RIGHT

ICA:                     100/21cm/sec

CCA:                     101/9cm/sec

SYSTOLIC ICA/CCA RATIO:

DIASTOLIC ICA/CCA RATIO:

ECA:                     93/4cm/sec

LEFT

ICA:                     93/30cm/sec

CCA:                     101/18cm/sec

SYSTOLIC ICA/CCA RATIO:

DIASTOLIC ICA/CCA RATIO:

ECA:                     93/9cm/sec
FINDINGS: RIGHT CAROTID ARTERY: Eccentric partially calcified plaque in the
carotid bulb extending to the ICA origin. No high-grade stenosis.
Normal waveforms and color Doppler signal.

RIGHT VERTEBRAL ARTERY:  Normal flow direction and waveform.

LEFT CAROTID ARTERY: Mild eccentric plaque in the proximal ICA. No
high-grade stenosis. Normal waveforms and color Doppler signal.

LEFT VERTEBRAL ARTERY: Normal flow direction and waveform.
IMPRESSION: 1. Mild bilateral carotid bifurcation and proximal ICA plaque
resulting in less than 50% diameter stenosis. The exam does not
exclude plaque ulceration or embolization. Continued surveillance
recommended.

## 2016-07-19 DIAGNOSIS — F329 Major depressive disorder, single episode, unspecified: Secondary | ICD-10-CM | POA: Diagnosis not present

## 2016-07-19 DIAGNOSIS — E43 Unspecified severe protein-calorie malnutrition: Secondary | ICD-10-CM | POA: Diagnosis not present

## 2016-07-19 DIAGNOSIS — C8339 Diffuse large B-cell lymphoma, extranodal and solid organ sites: Secondary | ICD-10-CM | POA: Diagnosis not present

## 2016-07-19 DIAGNOSIS — E119 Type 2 diabetes mellitus without complications: Secondary | ICD-10-CM | POA: Diagnosis not present

## 2016-07-19 DIAGNOSIS — F419 Anxiety disorder, unspecified: Secondary | ICD-10-CM | POA: Diagnosis not present

## 2016-07-19 DIAGNOSIS — J449 Chronic obstructive pulmonary disease, unspecified: Secondary | ICD-10-CM | POA: Diagnosis not present

## 2016-07-20 DIAGNOSIS — E43 Unspecified severe protein-calorie malnutrition: Secondary | ICD-10-CM | POA: Diagnosis not present

## 2016-07-20 DIAGNOSIS — E119 Type 2 diabetes mellitus without complications: Secondary | ICD-10-CM | POA: Diagnosis not present

## 2016-07-20 DIAGNOSIS — F329 Major depressive disorder, single episode, unspecified: Secondary | ICD-10-CM | POA: Diagnosis not present

## 2016-07-20 DIAGNOSIS — J449 Chronic obstructive pulmonary disease, unspecified: Secondary | ICD-10-CM | POA: Diagnosis not present

## 2016-07-20 DIAGNOSIS — C8339 Diffuse large B-cell lymphoma, extranodal and solid organ sites: Secondary | ICD-10-CM | POA: Diagnosis not present

## 2016-07-20 DIAGNOSIS — F419 Anxiety disorder, unspecified: Secondary | ICD-10-CM | POA: Diagnosis not present

## 2016-07-21 DIAGNOSIS — F329 Major depressive disorder, single episode, unspecified: Secondary | ICD-10-CM | POA: Diagnosis not present

## 2016-07-21 DIAGNOSIS — C8339 Diffuse large B-cell lymphoma, extranodal and solid organ sites: Secondary | ICD-10-CM | POA: Diagnosis not present

## 2016-07-21 DIAGNOSIS — E7800 Pure hypercholesterolemia, unspecified: Secondary | ICD-10-CM | POA: Diagnosis not present

## 2016-07-21 DIAGNOSIS — I10 Essential (primary) hypertension: Secondary | ICD-10-CM | POA: Diagnosis not present

## 2016-07-21 DIAGNOSIS — G4733 Obstructive sleep apnea (adult) (pediatric): Secondary | ICD-10-CM | POA: Diagnosis not present

## 2016-07-21 DIAGNOSIS — E43 Unspecified severe protein-calorie malnutrition: Secondary | ICD-10-CM | POA: Diagnosis not present

## 2016-07-21 DIAGNOSIS — Z8673 Personal history of transient ischemic attack (TIA), and cerebral infarction without residual deficits: Secondary | ICD-10-CM | POA: Diagnosis not present

## 2016-07-21 DIAGNOSIS — F419 Anxiety disorder, unspecified: Secondary | ICD-10-CM | POA: Diagnosis not present

## 2016-07-21 DIAGNOSIS — F17210 Nicotine dependence, cigarettes, uncomplicated: Secondary | ICD-10-CM | POA: Diagnosis not present

## 2016-07-21 DIAGNOSIS — E119 Type 2 diabetes mellitus without complications: Secondary | ICD-10-CM | POA: Diagnosis not present

## 2016-07-21 DIAGNOSIS — N390 Urinary tract infection, site not specified: Secondary | ICD-10-CM | POA: Diagnosis not present

## 2016-07-21 DIAGNOSIS — Z8669 Personal history of other diseases of the nervous system and sense organs: Secondary | ICD-10-CM | POA: Diagnosis not present

## 2016-07-21 DIAGNOSIS — Z7984 Long term (current) use of oral hypoglycemic drugs: Secondary | ICD-10-CM | POA: Diagnosis not present

## 2016-07-21 DIAGNOSIS — J449 Chronic obstructive pulmonary disease, unspecified: Secondary | ICD-10-CM | POA: Diagnosis not present

## 2016-07-21 DIAGNOSIS — M1990 Unspecified osteoarthritis, unspecified site: Secondary | ICD-10-CM | POA: Diagnosis not present

## 2016-07-21 DIAGNOSIS — K219 Gastro-esophageal reflux disease without esophagitis: Secondary | ICD-10-CM | POA: Diagnosis not present

## 2016-07-22 DIAGNOSIS — J449 Chronic obstructive pulmonary disease, unspecified: Secondary | ICD-10-CM | POA: Diagnosis not present

## 2016-07-22 DIAGNOSIS — F419 Anxiety disorder, unspecified: Secondary | ICD-10-CM | POA: Diagnosis not present

## 2016-07-22 DIAGNOSIS — E119 Type 2 diabetes mellitus without complications: Secondary | ICD-10-CM | POA: Diagnosis not present

## 2016-07-22 DIAGNOSIS — E43 Unspecified severe protein-calorie malnutrition: Secondary | ICD-10-CM | POA: Diagnosis not present

## 2016-07-22 DIAGNOSIS — C8339 Diffuse large B-cell lymphoma, extranodal and solid organ sites: Secondary | ICD-10-CM | POA: Diagnosis not present

## 2016-07-22 DIAGNOSIS — F329 Major depressive disorder, single episode, unspecified: Secondary | ICD-10-CM | POA: Diagnosis not present

## 2016-07-23 DIAGNOSIS — F419 Anxiety disorder, unspecified: Secondary | ICD-10-CM | POA: Diagnosis not present

## 2016-07-23 DIAGNOSIS — E119 Type 2 diabetes mellitus without complications: Secondary | ICD-10-CM | POA: Diagnosis not present

## 2016-07-23 DIAGNOSIS — F329 Major depressive disorder, single episode, unspecified: Secondary | ICD-10-CM | POA: Diagnosis not present

## 2016-07-23 DIAGNOSIS — C8339 Diffuse large B-cell lymphoma, extranodal and solid organ sites: Secondary | ICD-10-CM | POA: Diagnosis not present

## 2016-07-23 DIAGNOSIS — E43 Unspecified severe protein-calorie malnutrition: Secondary | ICD-10-CM | POA: Diagnosis not present

## 2016-07-23 DIAGNOSIS — J449 Chronic obstructive pulmonary disease, unspecified: Secondary | ICD-10-CM | POA: Diagnosis not present

## 2016-07-24 ENCOUNTER — Ambulatory Visit: Payer: Medicare Other | Admitting: Oncology

## 2016-07-24 ENCOUNTER — Telehealth: Payer: Self-pay | Admitting: *Deleted

## 2016-07-24 DIAGNOSIS — F329 Major depressive disorder, single episode, unspecified: Secondary | ICD-10-CM | POA: Diagnosis not present

## 2016-07-24 DIAGNOSIS — J449 Chronic obstructive pulmonary disease, unspecified: Secondary | ICD-10-CM | POA: Diagnosis not present

## 2016-07-24 DIAGNOSIS — F419 Anxiety disorder, unspecified: Secondary | ICD-10-CM | POA: Diagnosis not present

## 2016-07-24 DIAGNOSIS — E119 Type 2 diabetes mellitus without complications: Secondary | ICD-10-CM | POA: Diagnosis not present

## 2016-07-24 DIAGNOSIS — E43 Unspecified severe protein-calorie malnutrition: Secondary | ICD-10-CM | POA: Diagnosis not present

## 2016-07-24 DIAGNOSIS — C8339 Diffuse large B-cell lymphoma, extranodal and solid organ sites: Secondary | ICD-10-CM | POA: Diagnosis not present

## 2016-08-21 NOTE — Telephone Encounter (Signed)
Hospice Home called to report that Kristi Kramer passed away at 7:55AM this morning

## 2016-08-21 DEATH — deceased

## 2016-09-12 ENCOUNTER — Ambulatory Visit: Payer: Medicare Other | Admitting: Neurology

## 2016-09-17 ENCOUNTER — Ambulatory Visit: Payer: Medicare Other | Admitting: Neurology

## 2017-03-21 IMAGING — CT CT BIOPSY
2 series · 10 of 14 positions shown, 11 images · non-contrast
Comparison: none

INDICATION: B-cell lymphoma

[Series 2: i-spiral 5.0 b30f · axial · 0.55mm/px · z∈[+1032,+1060]mm · 2 of 25 slices shown, 3 images]
[im 9/25  soft-tissue]
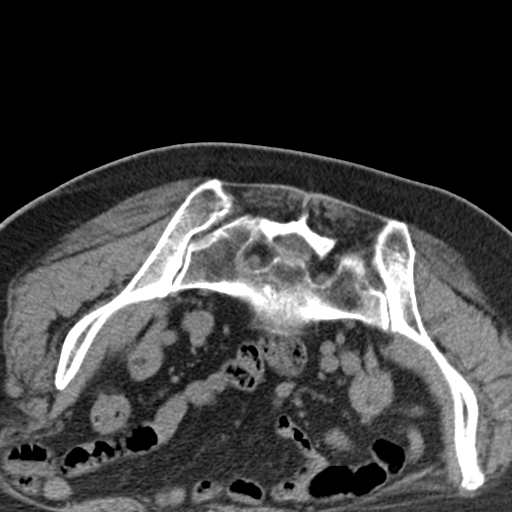
[im 9/25  bone]
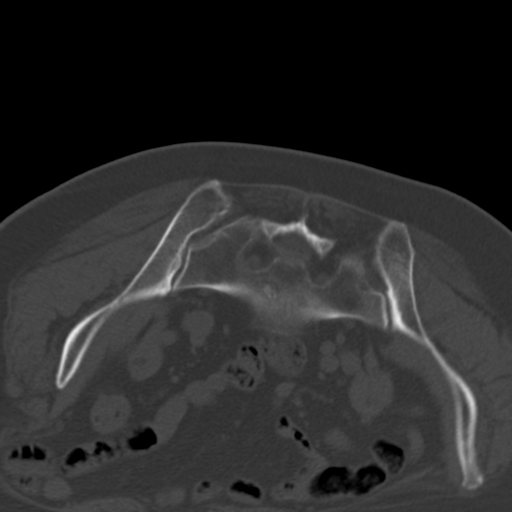
[im 17/25  bone]
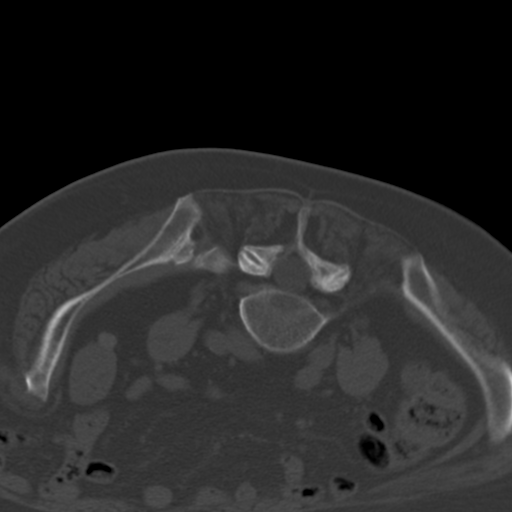

[Series 3: i-sequence 4.8 b30s · axial · 0.55mm/px · z∈[+1020,+1033]mm · 8 of 63 slices shown]
[im 7/63  bone]
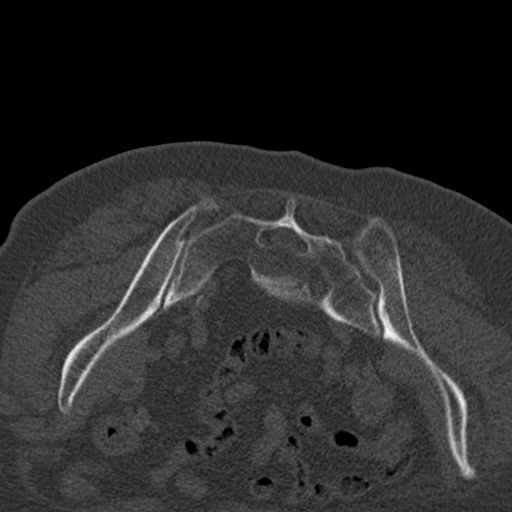
[im 14/63  bone]
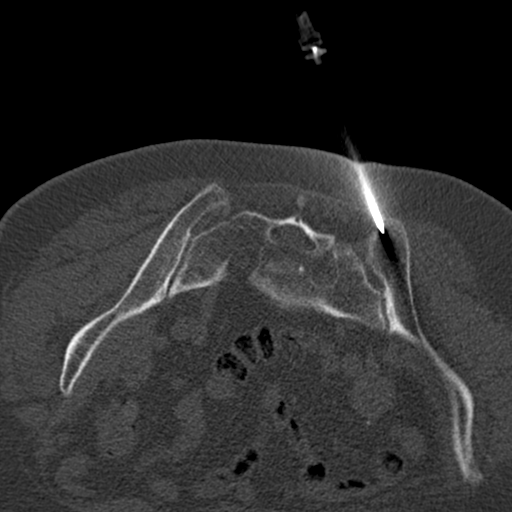
[im 21/63  bone]
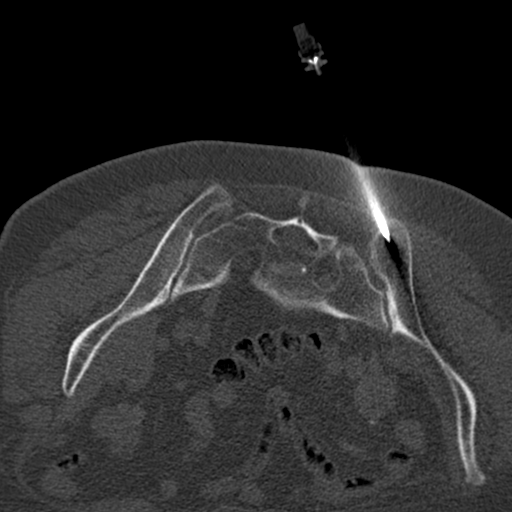
[im 28/63  bone]
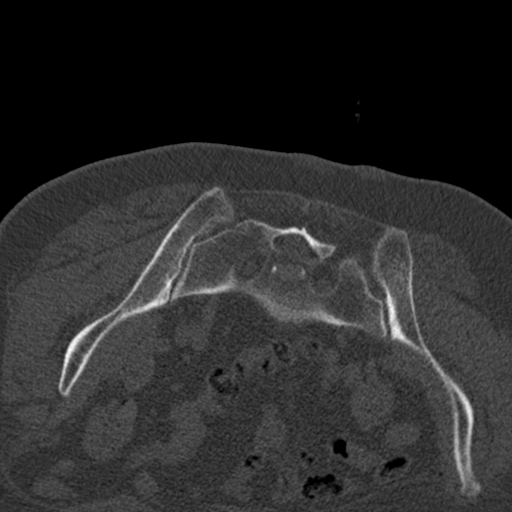
[im 35/63  bone]
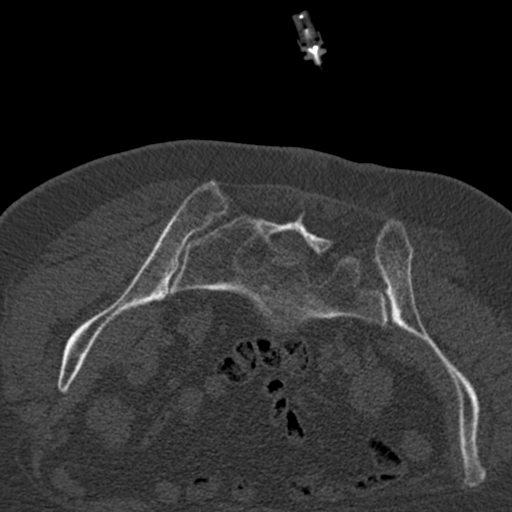
[im 42/63  bone]
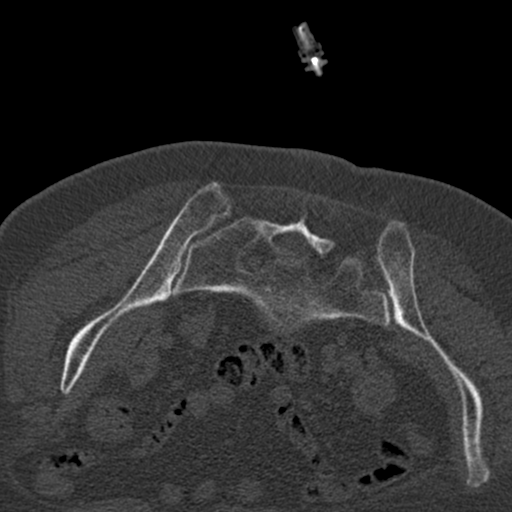
[im 49/63  bone]
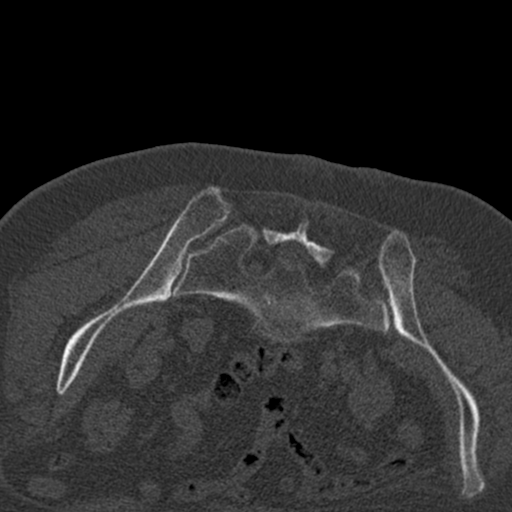
[im 56/63  bone]
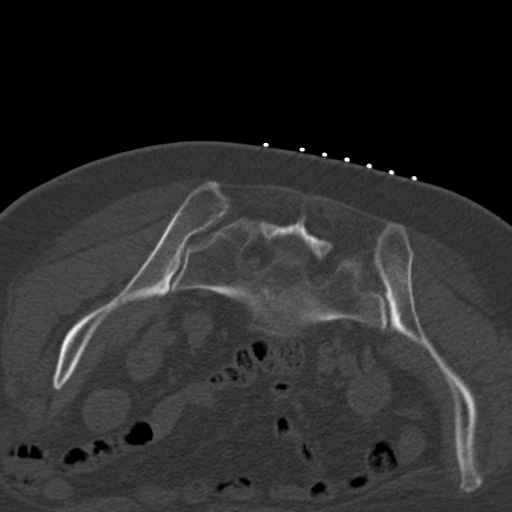

[10 of 14 positions shown; findings below may reference images not displayed]

EXAM:
CT BIOPSY

MEDICATIONS:
None.

ANESTHESIA/SEDATION:
Moderate (conscious) sedation was employed during this procedure. A
total Fentanyl 50 mcg was administered intravenously.

Moderate Sedation Time: 15 minutes. The patient's level of
consciousness and vital signs were monitored continuously by
radiology nursing throughout the procedure under my direct
supervision.

FLUOROSCOPY TIME:  Not applicable

COMPLICATIONS:
None immediate.

PROCEDURE:
Informed written consent was obtained from the patient after a
thorough discussion of the procedural risks, benefits and
alternatives. All questions were addressed. Maximal Sterile Barrier
Technique was utilized including caps, mask, sterile gowns, sterile
gloves, sterile drape, hand hygiene and skin antiseptic. A timeout
was performed prior to the initiation of the procedure.

Utilizing 0.25% Marcaine as a local anesthetic and CT fluoroscopic
guidance, an 11 gauge bone biopsy needle was placed into the right
iliac bone. Initial aspiration biopsy was performed and deemed
adequate by the cytotechnologist. Subsequently a core biopsy was
obtained and also deemed adequate. The needle was then removed.
Hemostasis was obtained at the puncture site. Puncture site was then
dressed in the standard sterile manner. The patient tolerated the
procedure well and was returned to her room in satisfactory
condition.
IMPRESSION: Successful CT-guided bone marrow biopsy.

## 2017-03-24 IMAGING — CT CT HEAD W/O CM
3 series · 15 of 47 positions shown, 18 images · non-contrast
Comparison: 05/21/2016

CLINICAL DATA: Recent syncopal episode, history of B-cell lymphoma

EXAM:
CT HEAD WITHOUT CONTRAST
TECHNIQUE: Contiguous axial images were obtained from the base of the skull
through the vertex without intravenous contrast.

[Series 2: head 5.0 h30s · axial · 0.45mm/px · z∈[-159,-34]mm · 9 of 31 slices shown, 12 images]
[im 3/31  brain]
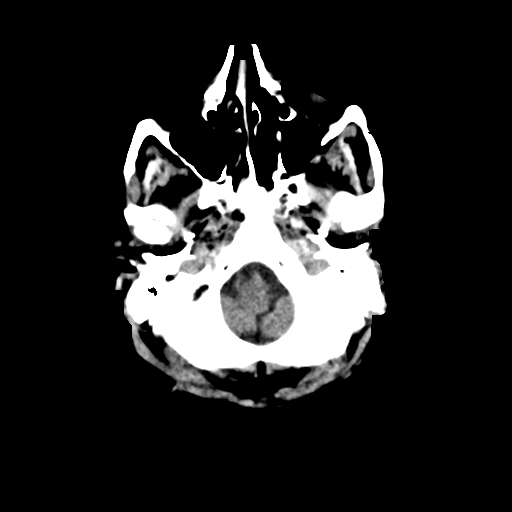
[im 3/31  bone]
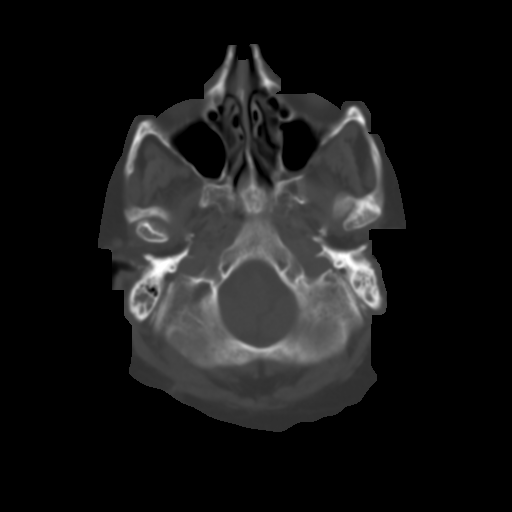
[im 6/31  brain]
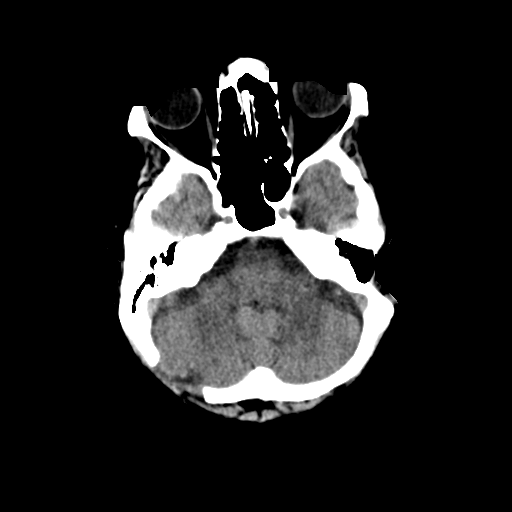
[im 9/31  brain]
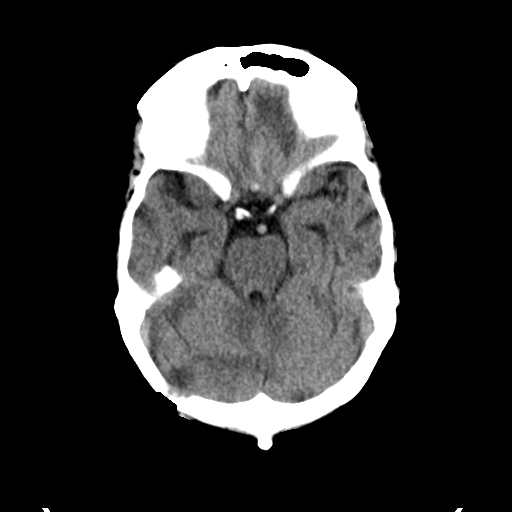
[im 12/31  brain]
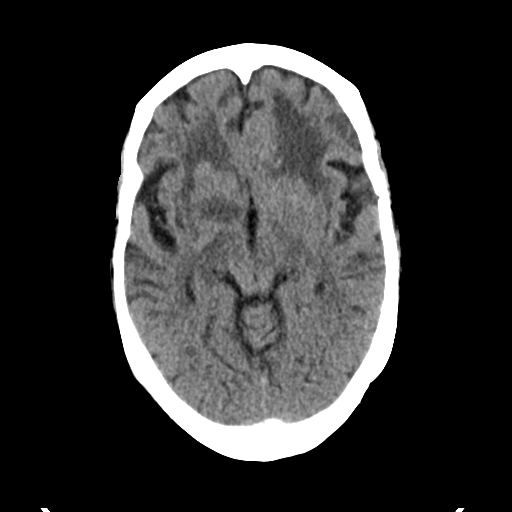
[im 16/31  brain]
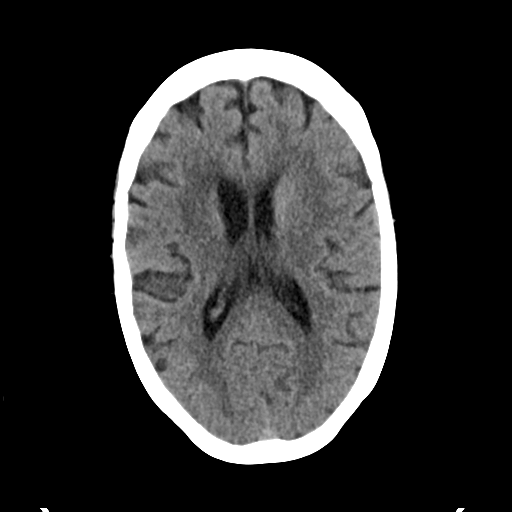
[im 16/31  bone]
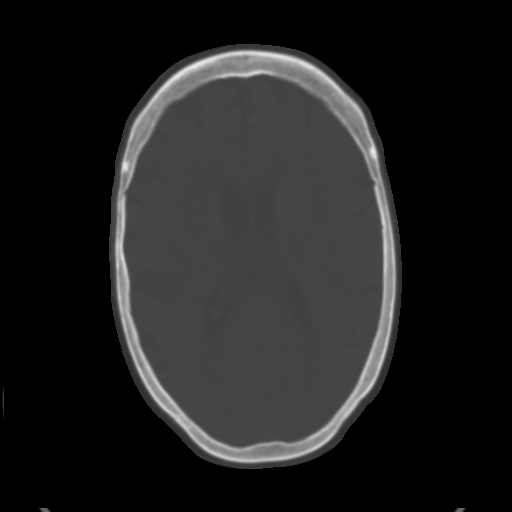
[im 19/31  brain]
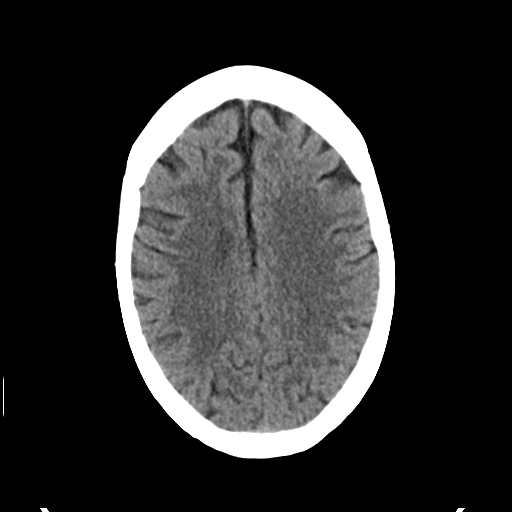
[im 22/31  brain]
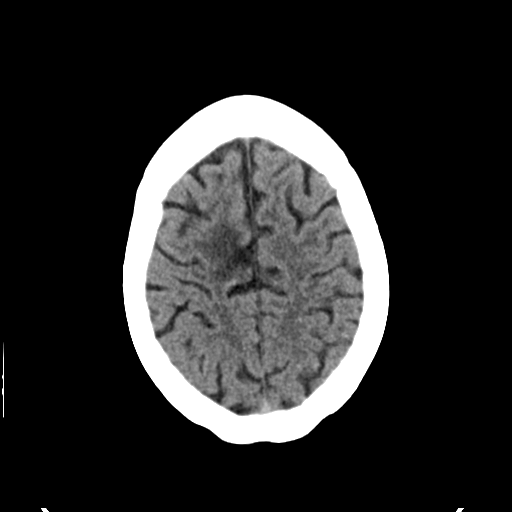
[im 25/31  brain]
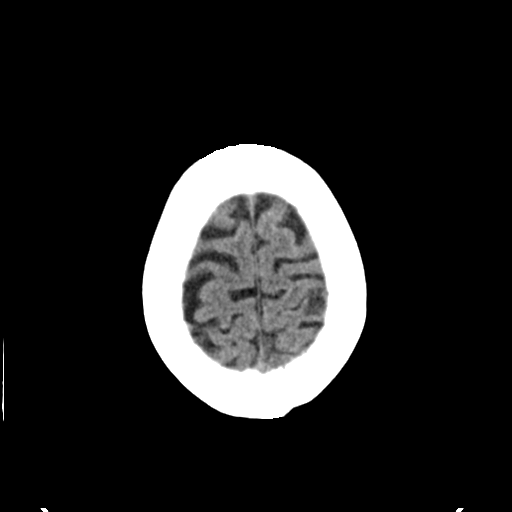
[im 28/31  brain]
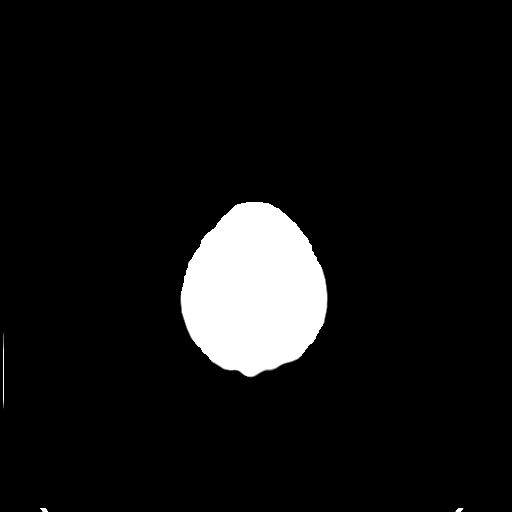
[im 28/31  bone]
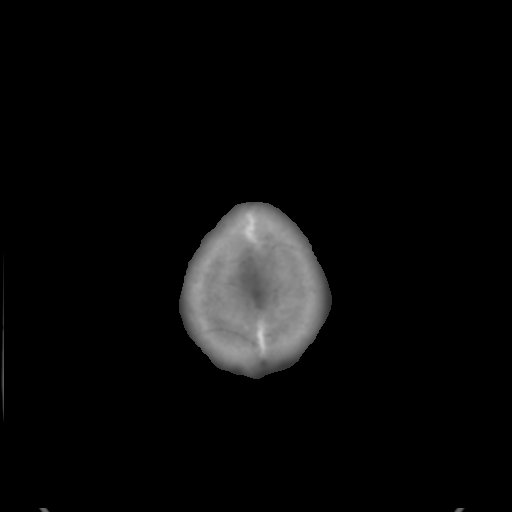

[Series 4: head 3.0 mpr cor · coronal · 0.32mm/px · 3 of 66 slices shown]
[im 22/66  brain]
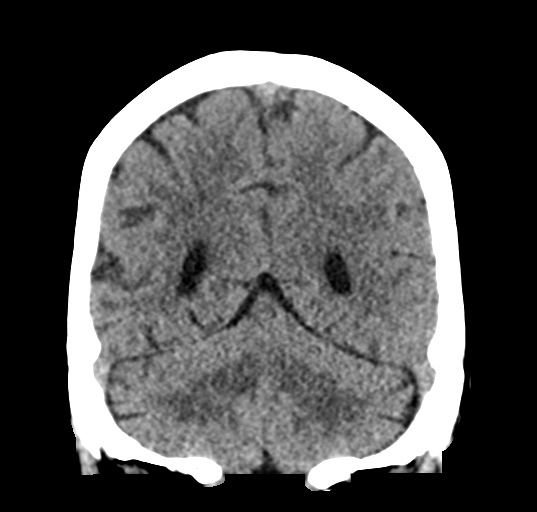
[im 29/66  brain]
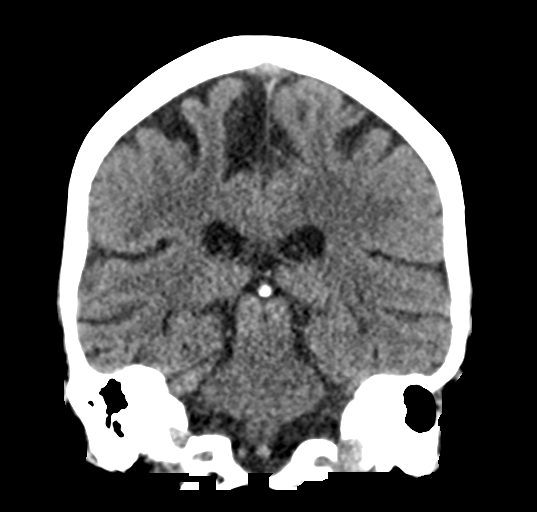
[im 37/66  brain]
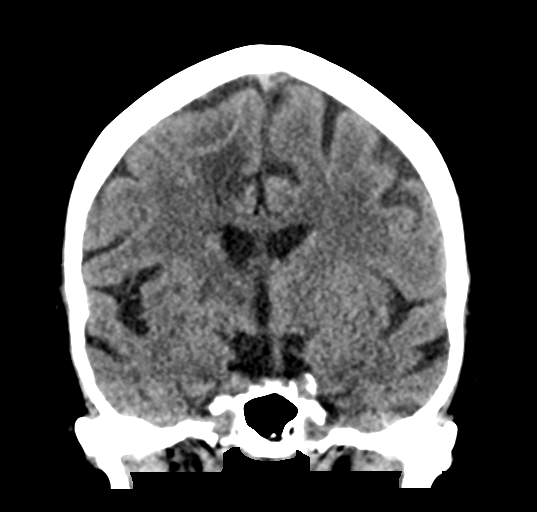

[Series 5: head 3.0 mpr sag · sagittal · 0.30mm/px · 3 of 49 slices shown]
[im 17/49  brain]
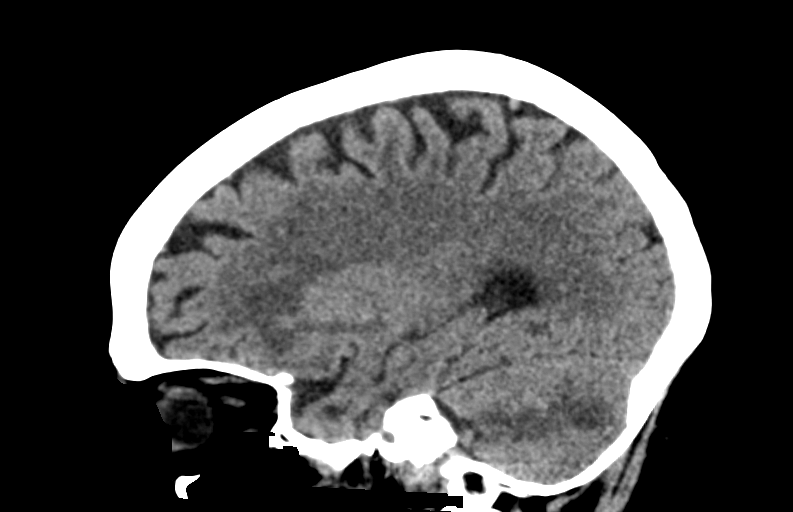
[im 25/49  brain]
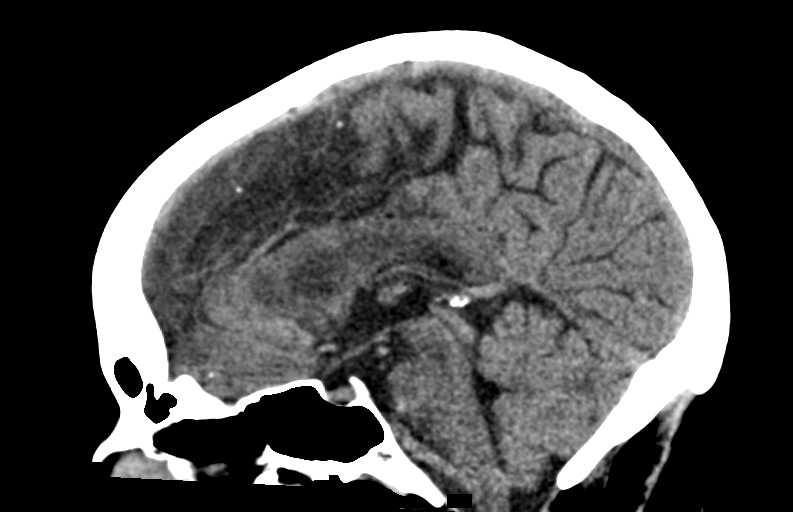
[im 33/49  brain]
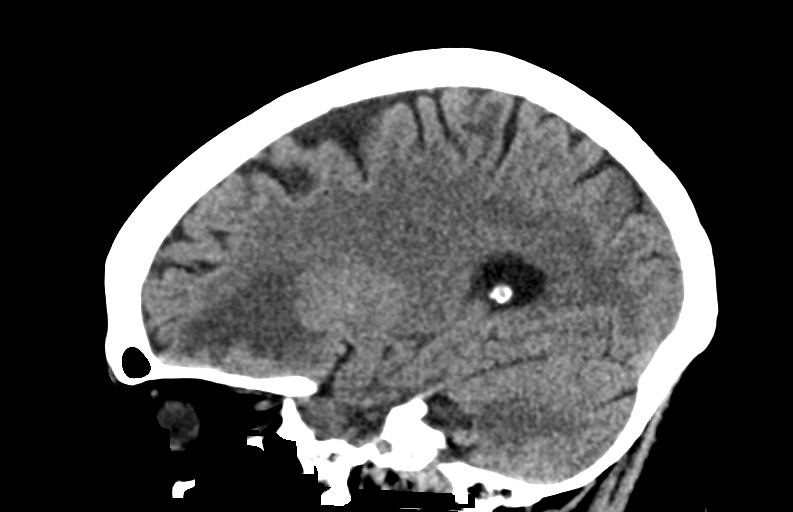

[15 of 47 positions shown; findings below may reference images not displayed]

FINDINGS: Brain: Postsurgical changes are noted in the right occipital lobe
consistent with the recent resection. Scattered areas of decreased
attenuation are noted within the frontal lobes bilaterally slightly
greater on the left, right frontal parietal region near the vertex
and in the region of the right basal ganglia. These are similar to
that seen on recent MRI examination. No acute hemorrhage or acute
infarct is seen.

Vascular: No hyperdense vessel or unexpected calcification.

Skull: Postsurgical changes are noted in the right occipital lobe.

Sinuses/Orbits: No acute finding.

Other: None.
IMPRESSION: Postsurgical changes are noted.

Multiple areas of decreased attenuation are identified similar to
that seen on prior MRI examination. No acute abnormality is seen.
